# Patient Record
Sex: Male | Born: 1955 | Race: White | Hispanic: No | Marital: Married | Smoking: Never smoker
Health system: Southern US, Community
[De-identification: ages and names within clinical notes are randomized; demographics above are authoritative.]

## PROBLEM LIST (undated history)

## (undated) DIAGNOSIS — D3500 Benign neoplasm of unspecified adrenal gland: Secondary | ICD-10-CM

## (undated) DIAGNOSIS — K219 Gastro-esophageal reflux disease without esophagitis: Secondary | ICD-10-CM

## (undated) DIAGNOSIS — K760 Fatty (change of) liver, not elsewhere classified: Secondary | ICD-10-CM

## (undated) DIAGNOSIS — I10 Essential (primary) hypertension: Secondary | ICD-10-CM

## (undated) DIAGNOSIS — I350 Nonrheumatic aortic (valve) stenosis: Secondary | ICD-10-CM

## (undated) DIAGNOSIS — K2970 Gastritis, unspecified, without bleeding: Secondary | ICD-10-CM

## (undated) DIAGNOSIS — Z8489 Family history of other specified conditions: Secondary | ICD-10-CM

## (undated) DIAGNOSIS — K769 Liver disease, unspecified: Secondary | ICD-10-CM

## (undated) DIAGNOSIS — M1990 Unspecified osteoarthritis, unspecified site: Secondary | ICD-10-CM

## (undated) DIAGNOSIS — T8859XA Other complications of anesthesia, initial encounter: Secondary | ICD-10-CM

## (undated) DIAGNOSIS — G43109 Migraine with aura, not intractable, without status migrainosus: Secondary | ICD-10-CM

## (undated) DIAGNOSIS — K5730 Diverticulosis of large intestine without perforation or abscess without bleeding: Secondary | ICD-10-CM

## (undated) DIAGNOSIS — F329 Major depressive disorder, single episode, unspecified: Secondary | ICD-10-CM

## (undated) DIAGNOSIS — E119 Type 2 diabetes mellitus without complications: Secondary | ICD-10-CM

## (undated) DIAGNOSIS — R011 Cardiac murmur, unspecified: Secondary | ICD-10-CM

## (undated) DIAGNOSIS — T7840XA Allergy, unspecified, initial encounter: Secondary | ICD-10-CM

## (undated) DIAGNOSIS — E785 Hyperlipidemia, unspecified: Secondary | ICD-10-CM

## (undated) DIAGNOSIS — K209 Esophagitis, unspecified: Secondary | ICD-10-CM

## (undated) DIAGNOSIS — L280 Lichen simplex chronicus: Secondary | ICD-10-CM

## (undated) DIAGNOSIS — F32A Depression, unspecified: Secondary | ICD-10-CM

## (undated) DIAGNOSIS — I517 Cardiomegaly: Secondary | ICD-10-CM

## (undated) HISTORY — DX: Migraine with aura, not intractable, without status migrainosus: G43.109

## (undated) HISTORY — DX: Major depressive disorder, single episode, unspecified: F32.9

## (undated) HISTORY — DX: Unspecified osteoarthritis, unspecified site: M19.90

## (undated) HISTORY — DX: Gastritis, unspecified, without bleeding: K29.70

## (undated) HISTORY — DX: Lichen simplex chronicus: L28.0

## (undated) HISTORY — DX: Fatty (change of) liver, not elsewhere classified: K76.0

## (undated) HISTORY — DX: Hyperlipidemia, unspecified: E78.5

## (undated) HISTORY — DX: Cardiac murmur, unspecified: R01.1

## (undated) HISTORY — DX: Allergy, unspecified, initial encounter: T78.40XA

## (undated) HISTORY — DX: Nonrheumatic aortic (valve) stenosis: I35.0

## (undated) HISTORY — PX: CARDIAC VALVE REPLACEMENT: SHX585

## (undated) HISTORY — PX: SPINE SURGERY: SHX786

## (undated) HISTORY — DX: Gastro-esophageal reflux disease without esophagitis: K21.9

## (undated) HISTORY — DX: Type 2 diabetes mellitus without complications: E11.9

## (undated) HISTORY — DX: Essential (primary) hypertension: I10

## (undated) HISTORY — DX: Diverticulosis of large intestine without perforation or abscess without bleeding: K57.30

## (undated) HISTORY — DX: Depression, unspecified: F32.A

## (undated) HISTORY — DX: Esophagitis, unspecified: K20.9

---

## 1987-06-16 HISTORY — PX: LUMBAR LAMINECTOMY: SHX95

## 2004-06-15 HISTORY — PX: COLONOSCOPY: SHX174

## 2004-10-27 ENCOUNTER — Ambulatory Visit (HOSPITAL_COMMUNITY): Admission: RE | Admit: 2004-10-27 | Discharge: 2004-10-27 | Payer: Self-pay | Admitting: Gastroenterology

## 2007-07-26 ENCOUNTER — Inpatient Hospital Stay (HOSPITAL_COMMUNITY): Admission: EM | Admit: 2007-07-26 | Discharge: 2007-07-28 | Payer: Self-pay | Admitting: Emergency Medicine

## 2007-07-27 ENCOUNTER — Encounter (INDEPENDENT_AMBULATORY_CARE_PROVIDER_SITE_OTHER): Payer: Self-pay | Admitting: Internal Medicine

## 2007-07-27 ENCOUNTER — Ambulatory Visit: Payer: Self-pay | Admitting: *Deleted

## 2009-12-03 ENCOUNTER — Ambulatory Visit: Payer: Self-pay | Admitting: Internal Medicine

## 2009-12-03 DIAGNOSIS — E7800 Pure hypercholesterolemia, unspecified: Secondary | ICD-10-CM

## 2009-12-03 DIAGNOSIS — R0981 Nasal congestion: Secondary | ICD-10-CM | POA: Insufficient documentation

## 2009-12-03 DIAGNOSIS — M25819 Other specified joint disorders, unspecified shoulder: Secondary | ICD-10-CM | POA: Insufficient documentation

## 2009-12-03 DIAGNOSIS — G8929 Other chronic pain: Secondary | ICD-10-CM | POA: Insufficient documentation

## 2009-12-03 DIAGNOSIS — N529 Male erectile dysfunction, unspecified: Secondary | ICD-10-CM | POA: Insufficient documentation

## 2009-12-03 DIAGNOSIS — E1169 Type 2 diabetes mellitus with other specified complication: Secondary | ICD-10-CM | POA: Insufficient documentation

## 2009-12-03 DIAGNOSIS — M7580 Other shoulder lesions, unspecified shoulder: Secondary | ICD-10-CM

## 2009-12-03 DIAGNOSIS — F3342 Major depressive disorder, recurrent, in full remission: Secondary | ICD-10-CM | POA: Insufficient documentation

## 2009-12-03 DIAGNOSIS — K5730 Diverticulosis of large intestine without perforation or abscess without bleeding: Secondary | ICD-10-CM | POA: Insufficient documentation

## 2009-12-03 DIAGNOSIS — M19011 Primary osteoarthritis, right shoulder: Secondary | ICD-10-CM | POA: Insufficient documentation

## 2009-12-03 DIAGNOSIS — I10 Essential (primary) hypertension: Secondary | ICD-10-CM | POA: Insufficient documentation

## 2009-12-03 DIAGNOSIS — R6882 Decreased libido: Secondary | ICD-10-CM | POA: Insufficient documentation

## 2009-12-09 ENCOUNTER — Encounter: Admission: RE | Admit: 2009-12-09 | Discharge: 2010-01-22 | Payer: Self-pay | Admitting: Internal Medicine

## 2009-12-10 ENCOUNTER — Telehealth (INDEPENDENT_AMBULATORY_CARE_PROVIDER_SITE_OTHER): Payer: Self-pay | Admitting: *Deleted

## 2009-12-10 LAB — CONVERTED CEMR LAB
Direct LDL: 153.6 mg/dL
PSA: 0.55 ng/mL (ref 0.10–4.00)

## 2009-12-11 ENCOUNTER — Encounter: Payer: Self-pay | Admitting: Family Medicine

## 2010-02-18 ENCOUNTER — Ambulatory Visit: Payer: Self-pay | Admitting: Internal Medicine

## 2010-02-21 LAB — CONVERTED CEMR LAB
Cholesterol: 189 mg/dL (ref 0–200)
HDL: 46.1 mg/dL (ref >39.00)
VLDL: 19.6 mg/dL (ref 0.0–40.0)

## 2010-04-22 ENCOUNTER — Ambulatory Visit: Payer: Self-pay | Admitting: Internal Medicine

## 2010-04-22 DIAGNOSIS — E118 Type 2 diabetes mellitus with unspecified complications: Secondary | ICD-10-CM | POA: Insufficient documentation

## 2010-04-22 DIAGNOSIS — M25569 Pain in unspecified knee: Secondary | ICD-10-CM | POA: Insufficient documentation

## 2010-04-22 DIAGNOSIS — E1169 Type 2 diabetes mellitus with other specified complication: Secondary | ICD-10-CM | POA: Insufficient documentation

## 2010-07-15 NOTE — Miscellaneous (Signed)
Summary: PT Initial Summary/[facility]  PT Initial [facility]   Imported By: Lanelle Bal 12/19/2009 10:09:01  _____________________________________________________________________  External Attachment:    Type:   Image     Comment:   External Document

## 2010-07-15 NOTE — Progress Notes (Signed)
Summary: Lab results  Phone Note Outgoing Call Call back at [facility] Phone [phone]   Call placed by: Shonna Chock,  December 10, 2009 11:49 AM Call placed to: Patient Summary of Call: Spoke with patient about labs below: PSA is excellent; ideal = < 1.00. Diabetes is present if A1c is > 6.1%. If Diabetes is present , LDL (BAD cholesterol, ) goal = < 100, ideally < 70. Please follow a low carb nutrtion program (Ex Sugar [employer]usters or [place], NOT [place] Sages'). Add Metformin , which will make your insulin more effective. Please repeat fasting  labs after 10 weeks & see me 2-3 days later.It was a pleasure to meet you . Hopp ( Lipids, BUN,creat, A1c; 250.00,272.4)  Chrae Malloy  December 10, 2009 11:49 AM

## 2010-07-15 NOTE — Assessment & Plan Note (Signed)
Summary: review for one med. glucose readings, will be fasting///sph   Vital Signs:  Patient profile:   55 year old male Height:      71.5 inches Weight:      307.6 pounds BMI:     42.46 Temp:     99.0 degrees F oral Pulse rate:   72 / minute Resp:     16 per minute BP sitting:   128 / 80  (left arm) Cuff size:   large  Vitals Entered By: Shonna Chock CMA (April 22, 2010 9:32 AM) CC: Renew Glucophage (if to continue), fasting if any labs needed, Type 2 diabetes mellitus follow-up, Lower Extremity Joint pain   CC:  Renew Glucophage (if to continue), fasting if any labs needed, Type 2 diabetes mellitus follow-up, and Lower Extremity Joint pain.  History of Present Illness: Type 2 Diabetes Mellitus Follow-Up      This is a 55 year old [name] who presents for Type 2 diabetes mellitus follow-up.  The patient denies polyuria, polydipsia, blurred vision, self managed hypoglycemia, weight loss, weight gain, and numbness of extremities.  The patient denies the following symptoms: neuropathic pain, chest pain, vomiting, orthostatic symptoms, poor wound healing, intermittent claudication, vision loss, and foot ulcer.  Since the last visit the patient reports good dietary compliance ( < 2500 calories/ day), exercising regularly ( walking 3-4X/week).He is  not monitoring blood glucose.  Since the last visit, the patient reports having had eye care by an Ophthalmologist ( no retinopathy)  and no foot care.  Hyperlipidemia Follow-Up      The patient also presents for Hyperlipidemia follow-up.  The patient denies the following symptoms: exercise intolerance, dypsnea, palpitations, syncope, and pedal edema.  Adjunctive measures currently used by the patient include ASA, niacin, and fish oil supplements.  Statin was D/Ced ? in 2005 for ? reason. Lower Extremity Joint Pain      The patient also presents with Lower Extremity Joint pain X 2-3 months intermittently.  The patient reports popping and decreased  ROM, but denies swelling, redness, giving away, locking, stiffness for >1 hr, and weakness.  The pain is located in the right knee.  The pain began gradually and with no injury.  The pain is described as burning.  Evaluation to date has included no evaluation.  The patient denies the following symptoms: fever, rash, photosensitivity, eye symptoms, diarrhea, and dysuria.  Rx: NSAIDS.  Current Medications (verified): 1)  Fish Oil 1200 Mg Caps (Omega-3 Fatty Acids) .... One Tablet By Mouth Once Daily 2)  Niacin 250 Mg Tabs (Niacin) .... One Tablet By Mouth Once Daily 3)  Topamax 50 Mg Tabs (Topiramate) .... 2 By Mouth Two Times A Day 4)  Viagra 100 Mg Tabs (Sildenafil Citrate) .... 1/2 -1  Once Daily As Needed 5)  Glucophage Xr 500 Mg Xr24h-Tab (Metformin Hcl) .Marland Kitchen.. 1 Two Times A Day With 2 Largest Meals 6)  Aspirin 81 Mg Tabs (Aspirin) .Marland Kitchen.. 1 By Mouth Once Daily  Allergies: 1)  ! Penicillin  Physical Exam  General:  in no acute distress; alert,appropriate and cooperative throughout examination Lungs:  Normal respiratory effort, chest expands symmetrically. Lungs are clear to auscultation, no crackles or wheezes. Heart:  Normal rate and regular rhythm. S1 and S2 normal without gallop, murmur, click, rub or other extra sounds. Pulses:  R and L carotid,radial,dorsalis pedis and posterior tibial pulses are full and equal bilaterally Extremities:  No clubbing, cyanosis, edema. Crepitus R knee > L w/o effusion. Good  nail health Neurologic:  alert & oriented X3 and sensation intact to light touch over feet.   Skin:  Intact without suspicious lesions or rashes Psych:  memory intact for recent and remote, normally interactive, and good eye contact.     Impression & Recommendations:  Problem # 1:  KNEE PAIN, RIGHT (ICD-719.46)  new issue His updated medication list for this problem includes:    Aspirin 81 Mg Tabs (Aspirin) .Marland Kitchen... 1 by mouth once daily    Tramadol Hcl 50 Mg Tabs (Tramadol hcl) .Marland Kitchen...  1/2 -1 every 6 hrs as needed for knee pain  Orders: Venipuncture (25956) TLB-Uric Acid, Blood (84550-URIC) T-Knee Right 2 view (73560TC)  Problem # 2:  HYPERTENSION (ICD-401.9) controlled  Problem # 3:  HYPERLIPIDEMIA (ICD-272.4) LDL not @ minimal goal His updated medication list for this problem includes:    Niacin 250 Mg Tabs (Niacin) ..... One tablet by mouth once daily    Pravastatin Sodium 20 Mg Tabs (Pravastatin sodium) .Marland Kitchen... 1 at bedtime  Problem # 4:  DIABETES MELLITUS (ICD-250.00)  His updated medication list for this problem includes:    Glucophage Xr 500 Mg Xr24h-tab (Metformin hcl) .Marland Kitchen... 1 two times a day with 2 largest meals    Aspirin 81 Mg Tabs (Aspirin) .Marland Kitchen... 1 by mouth once daily  Complete Medication List: 1)  Fish Oil 1200 Mg Caps (Omega-3 fatty acids) .... One tablet by mouth once daily 2)  Niacin 250 Mg Tabs (Niacin) .... One tablet by mouth once daily 3)  Topamax 50 Mg Tabs (Topiramate) .... 2 by mouth two times a day 4)  Viagra 100 Mg Tabs (Sildenafil citrate) .... 1/2 -1  once daily as needed 5)  Glucophage Xr 500 Mg Xr24h-tab (Metformin hcl) .Marland Kitchen.. 1 two times a day with 2 largest meals 6)  Aspirin 81 Mg Tabs (Aspirin) .Marland Kitchen.. 1 by mouth once daily 7)  Pravastatin Sodium 20 Mg Tabs (Pravastatin sodium) .Marland Kitchen.. 1 at bedtime 8)  Tramadol Hcl 50 Mg Tabs (Tramadol hcl) .... 1/2 -1 every 6 hrs as needed for knee pain 9)  Onetouch Delica Lancets Misc (Lancets) .... Check bloodsugar once daily dx: 250.00 10)  Onetouch Ultra Blue Strp (Glucose blood) .... Check bloodsugar once daily, dx: 250.00  Other Orders: Admin 1st Vaccine (38756) Flu Vaccine 46yrs + (43329)  Patient Instructions: 1)  Consume < 40 grams of High Fructose Corn Syrup sugar/ day. 2)  Please schedule a follow-up appointment in 3 months. 3)  Hepatic Panel prior to visit, ICD-9:995.20 4)  Lipid Panel prior to visit, ICD-9:272.4 5)  HbgA1C prior to visit, ICD-9:250.00 6)  Urine Microalbumin prior to  visit, ICD-9:250.00 7)  Check your blood sugars regularly.fasting goal = 90-150. 8)  See your eye doctor yearly to check for diabetic eye damage. 9)  Check your feet each night for sore areas, calluses or signs of infection. Prescriptions: ONETOUCH ULTRA BLUE  STRP (GLUCOSE BLOOD) Check bloodsugar once daily, DX: 250.00  #100 x 3   Entered by:   Shonna Chock CMA   Authorized by:   Marga Melnick MD   Signed by:   Shonna Chock CMA on 04/22/2010   Method used:   Faxed to ...       [facility]  [address] [id number] (retail)       [address]       [place], [place]  [address]       Ph: [phone] or [phone]  Fax: [phone]   RxID:   [id number] [facility] DELICA LANCETS  MISC (LANCETS) Check bloodsugar once daily DX: 250.00  #100 x 3   Entered by:   Shonna Chock CMA   Authorized by:   Marga Melnick MD   Signed by:   Shonna Chock CMA on 04/22/2010   Method used:   Faxed to ...       [facility]  [address] [id number] (retail)       [address]       [place], [place]  [address]       Ph: [phone] or [phone]       Fax: [phone]   RxID:   [id number] TRAMADOL HCL 50 MG TABS (TRAMADOL HCL) 1/2 -1 every 6 hrs as needed for knee pain  #30 x 1   Entered and Authorized by:   Marga Melnick MD   Signed by:   Marga Melnick MD on 04/22/2010   Method used:   Print then Give to Patient   RxID:   [id number] PRAVASTATIN SODIUM 20 MG TABS (PRAVASTATIN SODIUM) 1 at bedtime  #90 x 0   Entered and Authorized by:   Marga Melnick MD   Signed by:   Marga Melnick MD on 04/22/2010   Method used:   Print then Give to Patient   RxID:   [id number] GLUCOPHAGE XR 500 MG XR24H-TAB (METFORMIN HCL) 1 two times a day WITH 2 largest meals  #180 x 1   Entered and Authorized by:   Marga Melnick MD   Signed by:   Marga Melnick MD on 04/22/2010   Method used:   Faxed to ...       [facility]  [address] [id number] ([facility]       [address]        [place], [place]  [address]       Ph: [phone] or [phone]       Fax: [phone]   RxID:   [id number]  Flu Vaccine Consent Questions     Do you have a history of severe allergic reactions to this vaccine? no    Any prior history of allergic reactions to egg and/or gelatin? no    Do you have a sensitivity to the preservative Thimersol? no    Do you have a past history of Guillan-Barre Syndrome? no    Do you currently have an acute febrile illness? no    Have you ever had a severe reaction to latex? no    Vaccine information given and explained to patient? yes    Are you currently pregnant? no    Lot Number:[id number]   Exp Date:12/13/2010   Site Given  Left Deltoid IM   Orders Added: 1)  Admin 1st Vaccine [90471] 2)  Flu Vaccine 26yrs + [90658] 3)  Est. Patient Level IV [38101] 4)  Venipuncture [75102] 5)  TLB-Uric Acid, Blood [84550-URIC] 6)  T-Knee Right 2 view [73560TC]   .lbflu

## 2010-07-15 NOTE — Assessment & Plan Note (Signed)
Summary: new to est/lch   Vital Signs:  Patient profile:   55 year old male Height:      71 inches Weight:      312 pounds BMI:     43.67 BSA:     2.55 Pulse rate:   74 / minute Pulse rhythm:   regular Resp:     17 per minute BP sitting:   142 / 86  (left arm) Cuff size:   regular  Vitals Entered By: Ok Anis CMA (December 03, 2009 11:50 AM)  CC:  Right shoulder pain and can not bend right arm  and Shoulder pain.  History of Present Illness:  Shoulder Pain      This is a 55 year old man who presents with Shoulder pain present 2 years.  The patient reports stiffness and impaired ROM, but denies numbness, weakness, tingling, locking, swelling, and redness.  The pain is located in the right shoulder.  The pain began gradually and without injury.  The patient describes the pain as constant, burning, and aching.  The pain is better with rest and NSAIDS.  The pain is worse with activity & ROM ,especially throwing or curling(as weight lifting)  motions. No ADL impairment. There has been no evaluation to date.  Preventive Screening-Counseling & Management  Alcohol-Tobacco     Smoking Status: never  Caffeine-Diet-Exercise     Does Patient Exercise: yes  Current Medications (verified): 1)  Fish Oil 1200 Mg Caps (Omega-3 Fatty Acids) .... One Tablet By Mouth Once Daily 2)  Niacin 250 Mg Tabs (Niacin) .... One Tablet By Mouth Once Daily 3)  Topamax 50 Mg Tabs (Topiramate) .... One Tablet By Mouth Two Times A Day  Allergies (verified): 1)  ! Penicillin  Past History:  Past Medical History: Benign  Tremor RUE, Ophthalmic Migraines with slurred speech  Dr Thad Ranger ; ED Allergic rhinitis Hypertension, PMH of  Hyperlipidemia Depression, PMH of Murmur , "Aortic Sclerosis" , SBE prophylaxis not needed as per  Cardiology Diverticulosis, colon  Past Surgical History: Lumbar laminectomy L5-S1 Colonoscopy : Tics  Family History: Father: [place]',HTN, CVA , Lipids Mother:  COAD Siblings: bro: prostate cancer; bro : CBAG, DM; M uncles :CAD; M uncle: asbestosis;  Paternal FH Schizophrenia  Social History: Occupation: Secondary school teacher Married Never Smoked Alcohol use-yes:EXTREMELY rarely Regular exercise-yes:walks 1 hr 3-4X/week Smoking Status:  never Does Patient Exercise:  yes  Review of Systems Eyes:  Denies blurring, double vision, and vision loss-both eyes. ENT:  Denies difficulty swallowing and hoarseness. CV:  Denies chest pain or discomfort, leg cramps with exertion, swelling of feet, and swelling of hands. Resp:  Complains of morning headaches; denies excessive snoring and hypersomnolence; Np apnea as per wife. GI:  Denies abdominal pain, dark tarry stools, and indigestion. GU:  Complains of decreased libido, erectile dysfunction, and urinary frequency; denies discharge, dysuria, and hematuria. MS:  Complains of muscle weakness. Derm:  Denies changes in nail beds, dryness, hair loss, and poor wound healing. Neuro:  Denies brief paralysis, numbness, tingling, and weakness. Psych:  Complains of easily angered, easily tearful, and irritability; denies anxiety and depression; He has been intolerant to Wellbutrin & Cymbalta. Endo:  Complains of cold intolerance and excessive urination; denies excessive hunger, excessive thirst, and heat intolerance; DM screen  "normal " in 07/2009.  Physical Exam  General:  in no acute distress; alert,appropriate and cooperative throughout examination;overweight-appearing.   Neck:  No deformities, masses, or tenderness noted. Lungs:  Normal respiratory effort, chest expands symmetrically. Lungs  are clear to auscultation, no crackles or wheezes. Heart:  normal rate, regular rhythm, no gallop, no rub, no JVD, no HJR, and grade  1/6 systolic murmur @ R base.   Abdomen:  Bowel sounds positive,abdomen soft and non-tender without masses, organomegaly or hernias noted. Msk:  No deformity or scoliosis noted of thoracic or  lumbar spine.   Pulses:  R and L carotid,radial,dorsalis pedis and posterior tibial pulses are full and equal bilaterally Extremities:  No clubbing, cyanosis, edema, or deformity noted .Decreased  range of motion of  R shoulder .Pain with passive ROM Neurologic:  alert & oriented X3, strength normal in all extremities, gait normal, DTRs symmetrical and normal, finger-to-nose normal, and Romberg negative.   Skin:  Intact without suspicious lesions or rashes Cervical Nodes:  No lymphadenopathy noted Axillary Nodes:  No palpable lymphadenopathy Psych:  memory intact for recent and remote, normally interactive, good eye contact, not anxious appearing, and not depressed appearing.     Impression & Recommendations:  Problem # 1:  SHOULDER IMPINGEMENT SYNDROME, RIGHT (ICD-726.2)  Orders: Physical Therapy Referral (PT)  Problem # 2:  HYPERTENSION (ICD-401.9)  PMH of  Orders: Venipuncture (60454)  Problem # 3:  HYPERLIPIDEMIA (ICD-272.4)  PMH of His updated medication list for this problem includes:    Niacin 250 Mg Tabs (Niacin) ..... One tablet by mouth once daily  Orders: Venipuncture (09811) TLB-Lipid Panel (80061-LIPID)  Problem # 4:  ERECTILE DYSFUNCTION, ORGANIC (ICD-607.84)  Orders: TLB-PSA (Prostate Specific Antigen) (84153-PSA) Venipuncture (91478) TLB-A1C / Hgb A1C (Glycohemoglobin) (83036-A1C)  His updated medication list for this problem includes:    Viagra 100 Mg Tabs (Sildenafil citrate) .Marland Kitchen... 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 One tablet by mouth once daily 2)  Niacin 250 Mg Tabs (Niacin) .... One tablet by mouth once daily 3)  Topamax 50 Mg Tabs (Topiramate)  .... One tablet by mouth two times a day 4)  Viagra 100 Mg Tabs (Sildenafil citrate) .... 1/2 -1  once daily as needed  Patient Instructions: 1)  Call if pain meds are needed. Prescriptions: VIAGRA 100 MG TABS (SILDENAFIL CITRATE) 1/2 -1  once daily as needed  #10 x 3   Entered and Authorized by:   Marga Melnick MD   Signed by:   Marga Melnick MD on 12/03/2009   Method used:   Print then Give to Patient   RxID:   [id number]

## 2010-10-22 ENCOUNTER — Other Ambulatory Visit: Payer: Self-pay | Admitting: Internal Medicine

## 2010-10-22 NOTE — Telephone Encounter (Signed)
2)  Please schedule a follow-up appointment in 3 months. 3)  Hepatic Panel prior to visit, ICD-9:995.20 4)  Lipid Panel prior to visit, ICD-9:272.4 5)  HbgA1C prior to visit, ICD-9:250.00 6)  Urine Microalbumin prior to visit, ICD-9:250.00  Copied from 04/22/2010, labs were due 07/2010

## 2010-10-28 NOTE — H&P (Signed)
NAME:  HAPPY, [place] NO.:  [id number]   MEDICAL RECORD NO.:  [id number]          PATIENT TYPE:  INP   LOCATION:  0109                         FACILITY:  [facility]   PHYSICIAN:  Ramiro Harvest, MD    DATE OF BIRTH:  07/13/1955   DATE OF ADMISSION:  07/26/2007  DATE OF DISCHARGE:                              HISTORY & PHYSICAL   PRIMARY CARE PHYSICIAN:  Dr. Theresia Lo of [facility].   CARDIOLOGIST:  Dr. Everette Rank of [facility].   HISTORY OF PRESENT ILLNESS:  Evan [name] is a 56 year old white  gentleman with a history of hypertension, hyperlipidemia, erectile  dysfunction and family history of TIAs who presents from the PCP's  office with a 34-month history of dizziness, disequilibrium, slurred  speech and stuttering with mild headache and occasional tingling on  bilateral forearms as well as some diaphoresis.  The patient also states  that at times he feels that he is being pushed to the left. The patient  denies chest pain, no shortness of breath, no palpitations, no facial  asymmetry.  No numbness.  No visual changes.  The patient states  symptoms occur at any time and usually last about 2 hours but never  entirely go away. The patient states that symptoms have been worsening  over the past 2 weeks and coincide with the start of diuretic of HCTZ  and has also had a stress test and echo done per cardiologist 4 days  prior to admission with results which were pending. The patient went to  see primary care physician when he had a similar episode and was sent to  the ED for further evaluation. In the ED, a head CT was done which was  negative, labs were unremarkable. We were called to admit the patient  for further evaluation and management.   ALLERGIES:  The patient is allergic to PENICILLIN.   PAST MEDICAL HISTORY:  1. Hypertension.  2. Lumbar disk disease.  3. Hyperlipidemia.  4. Diverticulosis with colonoscopy of May 2006.  5. Erectile  dysfunction.  6. History of syncope thought to be secondary to Wellbutrin in 2002.      Medication was stopped and no episodes since then.  7. History of depression.   HOME MEDICATIONS:  1. Lipitor 10 mg daily.  2. Lotensin 10 mg daily.  3. Viagra 100 mg daily p.r.n.  4. HCTZ 12.5 mg daily.   SOCIAL HISTORY:  The patient lives in [place] with his wife and  has two children. He denies any tobacco abuse  Occasional alcohol use.  No IV drug use.   FAMILY HISTORY:  The patient's mother deceased age 1 from emphysema.  Father deceased age 81. He had a history of all Alzheimer's dementia,  UTI complications and a history of TIAs for 10 years. The patient has  two brothers, one brother with prostate cancer and the other half-  brother with diabetes and status post CABG. The patient has two  children, one of them with osteogenesis imperfecta, the other one  healthy.   REVIEW OF SYSTEMS:  As per HPI, otherwise negative.  PHYSICAL EXAM:  Temperature 97.2, blood pressure 139/85, pulse of 80,  respiratory rate 27, 95% on room air. CBG of 1-1/7.  GENERAL:  The patient is laying on gurney in no apparent distress.  HEENT: Normocephalic, atraumatic.  Pupils equal, round and reactive to  light.  Extraocular movements intact.  Oropharynx is clear, moist, no  lesions.  No exudates.  NECK:  Supple.  No lymphadenopathy.  RESPIRATORY:  Lungs are clear to auscultation bilaterally.  No wheezes,  no rhonchi.  No rales.  CARDIOVASCULAR:  Regular rate and rhythm.  No murmurs, rubs or gallops.  ABDOMEN:  Soft, nontender, nondistended.  Positive bowel sounds.  EXTREMITIES:  No clubbing, cyanosis or edema.  NEUROLOGICAL:  The patient is alert and oriented x3.  Cranial nerves II-  XII grossly intact.  Sensation is intact.  Cerebellum is intact.  Unable  to elicit reflexes diffusely. Visual fields are intact. Babinski is  negative. Pronator drift is negative, 5/5 bilateral upper extremity   strength, 5/5 bilateral lower extremity strength.  Normal grip strength.  No focal deficits.  Gait is unremarkable.   LABORATORY DATA:  White count 8.1, hemoglobin 15.2, platelets 308,  hematocrit 44.0, ANC of 4.6, sodium 3.8, potassium 3.5, chloride 102,  bicarb 29, BUN 9, creatinine 0.74, glucose of 109, calcium of 9.2, PTT  32.  PT 13.2, INR 1.0.  Point of care cardiac markers, CK-MB less than  1, troponin I less than 0.05, myoglobin 77.8. Head CT is normal.  EKG  normal sinus rhythm. Q-waves in leads 3 and aVF.   ASSESSMENT AND PLAN:  Evan [name] is a 55 year old gentleman with  a history of hypertension, hyperlipidemia, family history of TIA who  presents with a 67-month history of dizziness and slurred speech with  some worsening dizziness.   1. Dizziness/slurred speech.  Differential includes neurologic(TIA      versus CVA) versus neurocardiogenic versus cardiogenic versus      orthostasis versus anemia (unlikely with hemoglobin normal) versus      hypoxia( unlikely patient saturations greater than 90% on room air)      versus hypoglycemia(CBG of 117) versus drug-induced. The patient      is on a diuretic. Will admit the patient on to a telemetry floor.      Will check a hepatic panel, check orthostatics and get a swallow      evaluation. Check carotid Dopplers.  Check MRI, MRA of the head and      neck, check magnesium, check a UDS, check alcohol level, check a      homocystine level.  Check a hemoglobin A1c, cycle cardiac enzymes      q.12 h x2.  Check a fasting lipid panel, PT, OT,  aspirin 325 mg      daily.  Will need to get results of the echo and the stress test      from the cardiologist's office, Dr. Hoyle Barr office. If the      patient's MRI is positive will likely need a neurological consult.      Will hold the patient's diuretics for now and follow.  2. Hyperlipidemia. Will check a fasting lipid panel.  Continue home      dose of Lipitor.  3. Hypertension.   Continue home dose Lotensin. Hold      hydrochlorothiazide secondary to problem #1.  4. History of depression, stable.  5. Erectile dysfunction.  6. Diverticulosis.  7. Prophylaxis. Protonix for GI prophylaxis.  SCDs for DVT  prophylaxis.  It has been a pleasure taking care of Evan [name].      Ramiro Harvest, MD  Electronically Signed     DT/MEDQ  D:  07/26/2007  T:  07/27/2007  Job:  [id number]   cc:   Vikki Ports, M.D.  Fax: [phone]   Corky Crafts, MD  Fax: [phone]

## 2010-10-28 NOTE — Consult Note (Signed)
NAME:  Evan [name], Evan [name]               ACCOUNT NO.:  [id number]   MEDICAL RECORD NO.:  [id number]          PATIENT TYPE:  INP   LOCATION:  1406                         FACILITY:  [facility]   PHYSICIAN:  Casimiro Needle L. Reynolds, M.D.DATE OF BIRTH:  21-Oct-1955   DATE OF CONSULTATION:  07/28/2007  DATE OF DISCHARGE:                                 CONSULTATION   REQUESTING PHYSICIAN:  Dr. Adela Glimpse.   PRIMARY CARE PHYSICIAN:  Dr. Lanell Persons.   REASON FOR EVALUATION:  Dizziness, possible TIA.   HPI:  This is the initial outpatient consult visit for this 55 year old  man with a past medical history which includes hypertension,  hyperlipidemia, on medications, as well obesity.  The patient presented  to his primary care physician's office yesterday with problems with  subjective slurred speech, dizziness, and sensation of being pulled to  the left.  In reviewing the chart, it appears that he has actually had  these symptoms for a couple of months, and perhaps they have accelerated  over the past couple of weeks.  He said that he has a persistent  sensation of lightheadedness which is with him at all times, but  sometimes worse than others.  It does not seem to be necessarily  exacerbated by position changes.  It is definitely a lightheaded  sensation close to a vertigo or dizziness.  He has not really been  unsteady on his feet, although the lightheadedness has made him a little  bit nervous about driving.  He has not passed out or lost consciousness.  He does report a persistent, dull, bitemporal headache over this period  of time.  He says he does not really have a normal history of headache.  He does report ringing in his ears which is chronic but a little worse  lately, as well as subjective slurred speech and a tendency to stutter.  He thinks that the symptoms got worse around the time he started the  hydrochlorothiazide a couple of weeks ago.  He was admitted for these  symptoms, has had a  neurologic workup including MRI and MRA which is  personally reviewed.  He had a recent 2D echocardiogram and Cardiolite  study through [facility] which were also read as unremarkable.  Neurologic consultation was requested.   PAST MEDICAL HISTORY:  Remarkable for:  1. Hypertension and hyperlipidemia, both of which are treated.  2. He has history of erectile dysfunction which he takes occasional      Viagra.  3. He had syncope several years ago, thought due to Wellbutrin.  He      says he tolerates both Wellbutrin and Cymbalta poorly.  4. He does admit to a history of depression.  5. He has history of obesity and lumbar disk disease.   FAMILY/SOCIAL/REVIEW OF SYSTEMS:  Per admission H&P by Dr. Janee Morn on  July 26, 2007, which was reviewed.   MEDICATIONS ON ADMISSION:  He was taking:  1. Lipitor.  2. Lotensin.  3. Viagra.  4. Hydrochlorothiazide.   In the hospital, he has also been taking aspirin, Protonix, and the  blood pressure medicines  have been held.   PHYSICAL EXAMINATION:  VITAL SIGNS:  Temperature 97.7.  Blood pressure  119/61.  Pulse 65.  Respirations 18.  O2 sat 95% on room air.  Orthostatic vitals show a mild but appropriate increase in the blood  pressure lying and standing, with an increase in pulse as well.  HEAD:  Cranium was normocephalic and atraumatic.  Oropharynx benign.  NECK:  Supple without carotid or supraclavicular bruits.  HEART:  Regular rate and rhythm without murmurs.  NEUROLOGICAL EXAMINATION:  Mental status, he is awake, alert, and fully  oriented to time, place, and person.  Recent and remote memory are  intact.  Attention span, concentration, and fund of knowledge are all  appropriate.  Speech is fluent and not dysarthric.  He does very  occasionally stutter.  He seems to be rather anxious.  CRANIAL NERVES:  Pupils are equal and reactive.  Extraocular movements  are full without nystagmus.  Visual fields are full to confrontation.   Hearing is intact to conversational speech.  Face and the palate move  normally and symmetrically.  MOTOR:  Normal bulk and tone.  Normal strength in all tested extremity  muscles.  Sensation is intact to light touch, pin prick, and double  simultaneous stimulation in all extremities.  COORDINATION:  Rapid movements performed accurately.  Finger to nose and  heel-shin are performed accurately.  GAIT:  He arises easily from a chair and his stance is normal.  He is  able to heel-toe and tandem walk without much difficulty.  Reflexes 2+  and symmetric.  Toes are downgoing bilaterally.   LABORATORY REVIEW:  MRI of the brain with MRA of the intracranial and  extracranial circulation performed July 26, 2007, are personally  reviewed, and I would agree that the studies are normal.   IMPRESSION:  Nonspecific dizziness.  There is really no evidence that  this is cerebrovascular.  I suspect this is most likely due to anxiety.  The exam and/or imaging are normal.   RECOMMENDATIONS:  We will continue aspirin daily.  Recommend treat  anxiety with an SSRI, BuSpar, p.r.n. benzo, or combination thereof.  Plan to follow up with his primary physician.  Of note, he is obese and  admits to snoring, and it might be wise to look at a polysomnogram as he  admits to not sleeping very well, which may be due to anxiety but may  also be due in part to sleep apnea.  We will follow up p.r.n..  Thank  you for the consultation.      Michael L. Thad Ranger, M.D.  Electronically Signed     MLR/MEDQ  D:  07/28/2007  T:  07/29/2007  Job:  [id number]   cc:   Michiel Cowboy, MD   Vikki Ports, M.D.  Fax: [phone]   Corky Crafts, MD  Fax: [phone]

## 2010-10-28 NOTE — Discharge Summary (Signed)
NAME:  Evan [name], Evan [name]               ACCOUNT NO.:  [id number]   MEDICAL RECORD NO.:  [id number]          PATIENT TYPE:  INP   LOCATION:  1406                         FACILITY:  [facility]   PHYSICIAN:  Michiel Cowboy, MDDATE OF BIRTH:  06-01-1956   DATE OF ADMISSION:  07/26/2007  DATE OF DISCHARGE:  07/28/2007                               DISCHARGE SUMMARY   DISCHARGE DIAGNOSES:  1. Lightheadedness.  2. Question a transient ischemic attack versus anxiety.  3. Hypertension.  4. Hyperlipidemia.  5. History of depression.   STUDIES:  CT of the head without contrast showed normal examination.  Chest x-ray on the 10th of February showing no  acute cardiopulmonary  disease, mild peribronchial thickening.  MRI of the brain without  contrast showed normal appearance of the neck vessels, specifically no  carotid bifurcation stenosis, brachiocephalic vessel origins, arch and  vertebral artery origins are not  visualized.  MRI/MRA of the head  showing normal MRI of the brain, negative intracranial MRI angiography  of the large and medium-sized vessels.  Dopplers showing a left mid  internal carotid artery 40 to 60% low to mid range stenosis and a right  proximal internal carotid artery that has 40 to 59% stenosis.  Noted  high velocities throughout the common carotid artery as well.  Vertebral  artery flow antegrade bilaterally.   Other labs include LFTs within normal limits.  Hemoglobin A1c was 6.2.  Cardiac enzymes x3 within normal limits.  Total cholesterol 163, LDL 88,  homocysteine 7.1 which is within normal limits .  EKG:  Showing normal  sinus rhythm, no abnormalities on the telemetry.   HOSPITAL COURSE:  Please see H and P but briefly, this is a 55 year old  gentleman who presented with a history of six weeks' duration of  constant lightheadedness which on occasion becomes more severe and  occasional stuttering.  The patient has had an extensive cardiac workup  prior to this  including a stress testand an echocardiogram by Dr.  Eldridge Dace, which were all negative.  The patient presented to the  emergency department with another episode of worsening lightheadedness  and stuttering, admitted by [facility].   HOSPITAL COURSE:  1. Lightheadedness.  Etiology is not quite clear, but the patient was      noted to be relatively hypotensive; systolic is down to 106.  Will      hold his Lotensin.  His orthostatics were checked, and he was noted      to have somewhat elevated heart rate when he stands up, but his      blood pressure actually went up as well.  The patient was placed in      telemetry for 48 hours without any events.  2. Concerning his stuttering, a neurological workup was performed      including MRI/MRA of the brain, MRA of the neck vessels as well as      Dopplers as well as neurological consult per neurology.  His      symptoms are more consistent with anxiety or of a TIA.  His  neurological workup had negative MRI of the brain showing no      stroke.  Doppler did show mild to moderate stenosis bilaterally.      Would have patient discharged on aspirin 81 mg daily.  Continue      with modification strategy.  Continue Lipitor.  His LDL is actually      88.  Hemoglobin A1c is 6.2.  The patient is to have repeat Dopplers      in the next 6 months to see if there is any progression of disease.      If he has any neurological abnormalities, would suggest that he be      further evaluated.  May also  benefit in the future from a CT      angiogram of the neck  for further evaluation of plaque.  Will      defer this to his  primary care Evan [name].  3. History of depression.  The patient has been on Wellbutrin in the      past and developed similar reaction to the above.  The patient does      not wish to start any SSRI anxiolytic at this point.   DISCHARGE MEDICATIONS:  Lipitor 10 mgp.o.  daily.  Stop Lotensin.  The  patient should not use Viagra  while he is lightheaded.  Would continue  aspirin 81 mg p.o. daily.  The patient needs to check his blood pressure  while at home daily and call his physician if his systolic blood  pressure is above 180.  Otherwise, he needs to have close supervision  and follow up with his primary care Evan [name], Dr. Theresia Lo in one to two  weeks, and also he can return to Dr. Eldridge Dace for further workup and  further follow up concerning his consistent lightheadedness.      Michiel Cowboy, MD  Electronically Signed     AVD/MEDQ  D:  07/28/2007  T:  07/29/2007  Job:  [id number]   cc:   Vikki Ports, M.D.  Fax: [phone]

## 2010-10-31 NOTE — Op Note (Signed)
NAME:  Evan [name], Evan [name]               ACCOUNT NO.:  [id number]   MEDICAL RECORD NO.:  [id number]          PATIENT TYPE:  AMB   LOCATION:  ENDO                         FACILITY:  [facility]   PHYSICIAN:  John C. Madilyn Fireman, M.D.    DATE OF BIRTH:  09-07-1955   DATE OF PROCEDURE:  10/27/2004  DATE OF DISCHARGE:                                 OPERATIVE REPORT   PROCEDURE:  Colonoscopy.   INDICATIONS FOR PROCEDURE:  Average risk colon cancer screening.   PROCEDURE:  The patient was placed in the left lateral decubitus position  and placed on the pulse monitor with continuous low-flow oxygen delivered by  nasal cannula.  He was sedated with 100 mcg IV fentanyl and 7 mg IV Versed.  The Olympus video colonoscope was inserted into the rectum and advanced to  cecum, confirmed by transillumination of McBurney's point and visualization  of the ileocecal valve and appendiceal orifice.  The prep was fairly good  but quite suboptimal in some areas, and I could not rule out small lesions  less than 1 cm in all location.  Otherwise the cecum, ascending, and  transverse colon all appeared normal with no masses, polyps, diverticula, or  other mucosal abnormalities.  Within the sigmoid colon there were seen a few  scattered diverticula, no other abnormalities.  The rectum appeared normal,  and retroflexed view of the anus revealed no obvious internal hemorrhoids.  The scope was then withdrawn, and the patient returned to the recovery room  in stable condition.  He tolerated procedure well, and there were no  immediate complications.   IMPRESSION:  1.  Left-sided diverticulosis.  2.  Otherwise normal study.   PLAN:  1.  Repeat colonoscopy within 10 years.  2.  Consider sigmoidoscopy Hemoccults in 5 years.      JCH/MEDQ  D:  10/27/2004  T:  10/27/2004  Job:  [id number]   cc:   Dellis Anes. Idell Pickles, M.D.  [address]  [place]  [place] [address]  Fax: [phone]

## 2011-01-12 ENCOUNTER — Encounter: Payer: Self-pay | Admitting: Internal Medicine

## 2011-01-12 ENCOUNTER — Ambulatory Visit (INDEPENDENT_AMBULATORY_CARE_PROVIDER_SITE_OTHER): Payer: Federal, State, Local not specified - PPO | Admitting: Internal Medicine

## 2011-01-12 VITALS — BP 124/86 | HR 66 | Temp 98.4°F | Wt 278.6 lb

## 2011-01-12 DIAGNOSIS — I10 Essential (primary) hypertension: Secondary | ICD-10-CM

## 2011-01-12 DIAGNOSIS — M549 Dorsalgia, unspecified: Secondary | ICD-10-CM

## 2011-01-12 DIAGNOSIS — M5412 Radiculopathy, cervical region: Secondary | ICD-10-CM

## 2011-01-12 DIAGNOSIS — E119 Type 2 diabetes mellitus without complications: Secondary | ICD-10-CM

## 2011-01-12 LAB — CBC WITH DIFFERENTIAL/PLATELET
Eosinophils Relative: 1.3 % (ref 0.0–5.0)
HCT: 45.7 % (ref 39.0–52.0)
Hemoglobin: 15.4 g/dL (ref 13.0–17.0)
Lymphs Abs: 2.6 10*3/uL (ref 0.7–4.0)
Monocytes Relative: 12.1 % — ABNORMAL HIGH (ref 3.0–12.0)
Neutro Abs: 4.3 10*3/uL (ref 1.4–7.7)
Platelets: 287 10*3/uL (ref 150.0–400.0)
WBC: 8 10*3/uL (ref 4.5–10.5)

## 2011-01-12 LAB — POCT URINALYSIS DIPSTICK
Bilirubin, UA: NEGATIVE
Ketones, UA: NEGATIVE
Leukocytes, UA: NEGATIVE

## 2011-01-12 LAB — SEDIMENTATION RATE: Sed Rate: 10 mm/hr (ref 0–22)

## 2011-01-12 MED ORDER — GABAPENTIN 100 MG PO CAPS: 100.0000 mg | ORAL_CAPSULE | ORAL | Status: DC

## 2011-01-12 MED ORDER — TRAMADOL HCL 50 MG PO TABS: 50.0000 mg | ORAL_TABLET | Freq: Four times a day (QID) | ORAL | Status: AC | PRN

## 2011-01-12 NOTE — Assessment & Plan Note (Signed)
FBS 90 on average

## 2011-01-12 NOTE — Progress Notes (Signed)
Subjective:    Patient ID: Evan [name], male    DOB: 1956-01-18, 55 y.o.   MRN: [id number]  HPI #[id number] : Onset: sat  7/28   Location: L temple  Quality: sharp, burning Frequency: intermittently  Duration: few seconds  Precipitating factors: head movement; exposure to A/C  Prior treatment: Aleve w/o benefit Associated Symptoms Nausea/vomiting: no  Photophobia/phonophobia: no  Tearing of eyes: yes  Sinus pain/pressure: no  PMH  migraine: yes; similar to prior non migrainous  headaches Red Flags Fever: no  Neck pain/stiffness: no  Vision/speech/swallow/hearing difficulty: no  Focal weakness/numbness: no  Altered mental status: no  Trauma: no  New type of headache: no  Anticoagulant use: no       Review of Systems The major and minor symptoms of rhinosinusitis were reviewed. He has had minor  nasal congestion/obstruction w/o  nasal purulence; facial pain; anosmia; halitosis; earache and dental pain. FBS 90 on average  He has had lumbosacral pain on the right for several months. He is worried about his blood pressure medicine causing kidney stones.    Objective:   Physical Exam  Gen.: Healthy and well-nourished in appearance. Appears uncomfortable Head: Normocephalic without obvious abnormalities Eyes: No corneal or conjunctival inflammation noted. Pupils equal round reactive to light and accommodation. FOV WNL. EOMI. Vision grossly normal. Ears: External  ear exam reveals no significant lesions or deformities. Canals clear .TMs normal. Hearing is grossly normal bilaterally. Minor scarring in the right tympanic membrane. Nose: External nasal exam reveals no deformity or inflammation. Nasal mucosa are pink and moist. No lesions or exudates noted. Septum  Deviated to R  Mouth: Oral mucosa and oropharynx reveal no lesions or exudates. Teeth in good repair. Neck: No deformities, masses, or tenderness noted. Range of motion  normal. Lungs: Normal respiratory effort; chest  expands symmetrically. Lungs are clear to auscultation without rales, wheezes, or increased work of breathing. Heart: Normal rate and rhythm. Normal S1 and S2. No gallop, click, or rub. No murmur.                                                                                  Musculoskeletal/extremities:  No clubbing, cyanosis, edema, or deformity noted. Tone & strength  normal.Joints normal. Nail health  Good. He lay back & sat up w/o help. Neg SLR  Vascular: Carotid, radial artery  pulses are full and equal. No bruits present. Neurologic: Affect slightly flat  ;oriented x3. Deep tendon reflexes symmetrical and normal. Gait (heel/toe), Romberg testing, and finger-nose exams are normal. Cranial nerve exam reveals no deficit.        Skin: Intact without suspicious lesions or rashes. The left temple is tender to palpation; no rash is noted. Lymph: tender isolated left upper cervical lymph node tenderness w/o  axillary lymphadenopathy present. Psych: Mood and affect  As notedl.  Assessment & Plan:   #1 temporal neuralgia type pain; no neuromuscular deficits  #2 tender localized left cervical lymphadenopathy; no evidence of herpes zoster at this time  #3 hypertension controlled  #4 diabetes controlled based on fasting blood sugar results  #5 LBS  Plan: See orders and instructions.

## 2011-01-12 NOTE — Patient Instructions (Signed)

## 2011-01-12 NOTE — Assessment & Plan Note (Signed)
BP in 120s/ 80s @ home

## 2011-01-27 ENCOUNTER — Ambulatory Visit: Payer: Federal, State, Local not specified - PPO | Admitting: Internal Medicine

## 2011-01-27 ENCOUNTER — Ambulatory Visit (INDEPENDENT_AMBULATORY_CARE_PROVIDER_SITE_OTHER): Payer: Federal, State, Local not specified - PPO | Admitting: Family

## 2011-01-27 ENCOUNTER — Encounter: Payer: Self-pay | Admitting: Family

## 2011-01-27 VITALS — BP 120/76 | HR 70 | Temp 98.2°F | Ht 71.0 in | Wt 267.0 lb

## 2011-01-27 DIAGNOSIS — A09 Infectious gastroenteritis and colitis, unspecified: Secondary | ICD-10-CM

## 2011-01-27 DIAGNOSIS — R197 Diarrhea, unspecified: Secondary | ICD-10-CM

## 2011-01-27 MED ORDER — DIPHENOXYLATE-ATROPINE 2.5-0.025 MG PO TABS: 1.0000 | ORAL_TABLET | Freq: Four times a day (QID) | ORAL | Status: AC | PRN

## 2011-01-27 MED ORDER — CIPROFLOXACIN HCL 500 MG PO TABS: 500.0000 mg | ORAL_TABLET | Freq: Two times a day (BID) | ORAL | Status: AC

## 2011-01-27 NOTE — Patient Instructions (Addendum)
Please complete stool studies and return to the lab.  Start antibiotics. Call if you develop worsening abdominal pain, nausea/vomitting, or if your symptoms are not improved in 2-3 days.

## 2011-01-27 NOTE — Assessment & Plan Note (Signed)
55 yr old male with 4 day history of diarrhea following a trip to [place]. Will plan to treat empirically with Cipro and send stool studies.  Lomotil PRN.  Pt instructed to contact us if symptoms worsen or if they don't improve in 2-3 days.

## 2011-01-27 NOTE — Progress Notes (Signed)
Subjective:    Patient ID: Evan [name], male    DOB: [birth year], 55 y.o.   MRN: [id number]  HPI  Evan [name] is a 55 yr old male who presents today with chief complaint of dirrhea.  He reports that he recently completed a 1 week mission trip in [place].  Developed diarrhea late Friday night 01/24/11, and returned home on Saturday night 01/25/11.  He reports that he did not knowingly drink the water or eat any fresh vegetables. Reports  +cramping abdominal pain which is intermittent. Denies associated fever or nausea, black or bloody stools.  He does report  + Anorexia. Tolerating PO's and drinking gatorade.  Urinating regularly.   Review of Systems    see HPI  Past Medical History  Diagnosis Date  . Benign essential tremor     RUE, ophthamlic migraines w/ slurred speech Dr.Reynolds; ED  . Allergic rhinitis   . Hypertension   . Hyperlipidemia   . Depression   . Murmur     aortic sclerosis, SBE  prophlaxis not needed as per cardio  . Diverticulosis of colon     History   Social History  . Marital Status: Married    Spouse Name: N/A    Number of Children: N/A  . Years of Education: N/A   Occupational History  . Not on file.   Social History Main Topics  . Smoking status: Never Smoker   . Smokeless tobacco: Never Used  . Alcohol Use: Yes     extremely rare   . Drug Use: No  . Sexually Active: Not on file   Other Topics Concern  . Not on file   Social History Narrative   Regular exercise- yes walks 1 hr 3-4x/week    Past Surgical History  Procedure Date  . Lumbar laminectomy     L5-S1    Family History  Problem Relation Age of Onset  . Alzheimer's disease Father   . Hypertension Father   . Stroke Father   . Hyperlipidemia Father   . Coronary artery disease Mother   . Prostate cancer Brother   . Diabetes Brother   . Coronary artery disease Maternal Uncle   . Schizophrenia      Allergies  Allergen Reactions  . Penicillins     Respiratory distress as  infant    Current Outpatient Prescriptions on File Prior to Visit  Medication Sig Dispense Refill  . aspirin 81 MG tablet Take 81 mg by mouth daily.        . metFORMIN (GLUCOPHAGE-XR) 500 MG 24 hr tablet TAKE 1 TABLET BY MOUTH TWICE A DAY WITH 2 LARGEST MEALS  180 tablet  0  . Omega-3 Fatty Acids (FISH OIL) 1200 MG CAPS Take by mouth.        . pravastatin (PRAVACHOL) 20 MG tablet Take 20 mg by mouth daily.        . sildenafil (VIAGRA) 100 MG tablet 1/2-1 once daily as needed.       . topiramate (TOPAMAX) 50 MG tablet Take 100 mg by mouth 2 (two) times daily.          BP 120/76  Pulse 70  Temp(Src) 98.2 F (36.8 C) (Oral)  Ht 5\' 11"  (1.803 m)  Wt 267 lb 0.6 oz (121.129 kg)  BMI 37.24 kg/m2  SpO2 95%    Objective:   Physical Exam  Constitutional: He appears well-developed and well-nourished. No distress.  Cardiovascular: Normal rate and regular rhythm.   Pulmonary/Chest: Effort  normal and breath sounds normal.  Abdominal: Soft. He exhibits no distension and no mass. There is no tenderness. There is no rebound and no guarding.       + hypoactive bowel sounds.   Skin: Skin is warm and dry.  Psychiatric: He has a normal mood and affect. His behavior is normal. Judgment and thought content normal.          Assessment & Plan:

## 2011-03-06 LAB — BASIC METABOLIC PANEL
BUN: 10
BUN: 9
CO2: 29
CO2: 30
Chloride: 102
Chloride: 102
Chloride: 104
Creatinine, Ser: 0.97
Creatinine, Ser: 0.98
GFR calc Af Amer: >60
Glucose, Bld: 99
Potassium: 3.5
Sodium: 138

## 2011-03-06 LAB — CBC
HCT: 40.3
HCT: 40.7
HCT: 44
Hemoglobin: 15.2
MCHC: 34.5
MCHC: 34.9
MCV: 91.2
MCV: 91.8
MCV: 92.2
Platelets: 290
Platelets: 308
RBC: 4.37
RBC: 4.79
WBC: 8.1

## 2011-03-06 LAB — DIFFERENTIAL
Eosinophils Absolute: 0.1
Eosinophils Relative: 1
Lymphs Abs: 2.4
Monocytes Absolute: 0.9
Monocytes Relative: 11

## 2011-03-06 LAB — HOMOCYSTEINE: Homocysteine: 7.1

## 2011-03-06 LAB — RAPID URINE DRUG SCREEN, HOSP PERFORMED
Cocaine: NOT DETECTED
Opiates: NOT DETECTED

## 2011-03-06 LAB — HEMOGLOBIN A1C: Hgb A1c MFr Bld: 6.2 — ABNORMAL HIGH

## 2011-03-06 LAB — ETHANOL: Alcohol, Ethyl (B): <5

## 2011-03-06 LAB — CK TOTAL AND CKMB (NOT AT ARMC)
Relative Index: INVALID
Relative Index: INVALID
Relative Index: INVALID
Total CK: 93

## 2011-03-06 LAB — HEPATIC FUNCTION PANEL
ALT: 33
Alkaline Phosphatase: 58
Indirect Bilirubin: 0.7
Total Bilirubin: 0.8

## 2011-03-06 LAB — POCT CARDIAC MARKERS
CKMB, poc: <1 — ABNORMAL LOW
Myoglobin, poc: 77.8
Operator id: 4661
Troponin i, poc: <0.05

## 2011-03-06 LAB — URINE CULTURE: Culture: NO GROWTH

## 2011-03-06 LAB — LIPID PANEL
HDL: 44
Total CHOL/HDL Ratio: 3.5
VLDL: 21

## 2011-03-06 LAB — URINALYSIS, ROUTINE W REFLEX MICROSCOPIC
Glucose, UA: NEGATIVE
Hgb urine dipstick: NEGATIVE
Ketones, ur: NEGATIVE
Protein, ur: NEGATIVE

## 2011-03-15 ENCOUNTER — Other Ambulatory Visit: Payer: Self-pay | Admitting: Internal Medicine

## 2011-04-07 ENCOUNTER — Other Ambulatory Visit: Payer: Self-pay | Admitting: Internal Medicine

## 2011-04-07 MED ORDER — METFORMIN HCL ER 500 MG PO TB24: ORAL_TABLET | ORAL | Status: DC

## 2011-04-07 NOTE — Telephone Encounter (Signed)
a1c 250.00  

## 2011-05-12 ENCOUNTER — Other Ambulatory Visit: Payer: Self-pay | Admitting: Internal Medicine

## 2011-05-14 NOTE — Telephone Encounter (Signed)
Lipid/Hep 272.4/995.20  

## 2011-06-15 ENCOUNTER — Other Ambulatory Visit: Payer: Self-pay | Admitting: Internal Medicine

## 2011-07-24 ENCOUNTER — Other Ambulatory Visit: Payer: Self-pay | Admitting: Internal Medicine

## 2011-07-24 NOTE — Telephone Encounter (Signed)
a1c 250.00  

## 2011-09-01 ENCOUNTER — Other Ambulatory Visit: Payer: Self-pay | Admitting: Internal Medicine

## 2011-09-02 NOTE — Telephone Encounter (Signed)
A1C 250.00 

## 2011-10-11 ENCOUNTER — Other Ambulatory Visit: Payer: Self-pay | Admitting: Internal Medicine

## 2011-10-12 NOTE — Telephone Encounter (Signed)
Patient needs to schedule a CPX  

## 2011-11-20 ENCOUNTER — Other Ambulatory Visit: Payer: Self-pay | Admitting: Internal Medicine

## 2011-11-27 ENCOUNTER — Other Ambulatory Visit: Payer: Self-pay | Admitting: Internal Medicine

## 2011-11-27 NOTE — Telephone Encounter (Signed)
Resend from 6.10.13 not recvd/SLS

## 2012-02-06 ENCOUNTER — Other Ambulatory Visit: Payer: Self-pay | Admitting: Internal Medicine

## 2012-02-08 NOTE — Telephone Encounter (Signed)
A1C 250.00, Patient needs to schedule a CPX ([facility])

## 2012-02-17 ENCOUNTER — Other Ambulatory Visit: Payer: Self-pay | Admitting: Internal Medicine

## 2012-02-17 NOTE — Telephone Encounter (Signed)
Unfortunately this cannot be refilled; he has not been seen for over a year. He has multiple health diagnoses including hypertension, lipids, diabetes with no current assessment. These will be reassessed prior to refill of  this medication.

## 2012-02-17 NOTE — Telephone Encounter (Signed)
Last RX'ed 2011, Last OV 12/2010.

## 2012-04-26 ENCOUNTER — Encounter: Payer: Self-pay | Admitting: Family Medicine

## 2012-04-26 ENCOUNTER — Ambulatory Visit (INDEPENDENT_AMBULATORY_CARE_PROVIDER_SITE_OTHER): Payer: Federal, State, Local not specified - PPO | Admitting: Family Medicine

## 2012-04-26 VITALS — BP 148/88 | HR 84 | Temp 98.5°F | Ht 70.25 in | Wt 303.2 lb

## 2012-04-26 DIAGNOSIS — Z125 Encounter for screening for malignant neoplasm of prostate: Secondary | ICD-10-CM

## 2012-04-26 DIAGNOSIS — J309 Allergic rhinitis, unspecified: Secondary | ICD-10-CM

## 2012-04-26 DIAGNOSIS — F3289 Other specified depressive episodes: Secondary | ICD-10-CM

## 2012-04-26 DIAGNOSIS — Z23 Encounter for immunization: Secondary | ICD-10-CM

## 2012-04-26 DIAGNOSIS — F329 Major depressive disorder, single episode, unspecified: Secondary | ICD-10-CM

## 2012-04-26 DIAGNOSIS — I10 Essential (primary) hypertension: Secondary | ICD-10-CM

## 2012-04-26 DIAGNOSIS — G43809 Other migraine, not intractable, without status migrainosus: Secondary | ICD-10-CM

## 2012-04-26 DIAGNOSIS — N529 Male erectile dysfunction, unspecified: Secondary | ICD-10-CM

## 2012-04-26 DIAGNOSIS — E785 Hyperlipidemia, unspecified: Secondary | ICD-10-CM

## 2012-04-26 DIAGNOSIS — E119 Type 2 diabetes mellitus without complications: Secondary | ICD-10-CM

## 2012-04-26 DIAGNOSIS — G43109 Migraine with aura, not intractable, without status migrainosus: Secondary | ICD-10-CM | POA: Insufficient documentation

## 2012-04-26 LAB — LIPID PANEL
HDL: 63.7 mg/dL (ref >39.00)
Triglycerides: 122 mg/dL (ref 0.0–149.0)

## 2012-04-26 LAB — BASIC METABOLIC PANEL
CO2: 27 mEq/L (ref 19–32)
Calcium: 9.1 mg/dL (ref 8.4–10.5)
Chloride: 103 mEq/L (ref 96–112)
Creatinine, Ser: 1 mg/dL (ref 0.4–1.5)
Glucose, Bld: 128 mg/dL — ABNORMAL HIGH (ref 70–99)
Sodium: 138 mEq/L (ref 135–145)

## 2012-04-26 LAB — MICROALBUMIN / CREATININE URINE RATIO
Creatinine,U: 139.8 mg/dL
Microalb Creat Ratio: 0.7 mg/g (ref 0.0–30.0)
Microalb, Ur: 1 mg/dL (ref 0.0–1.9)

## 2012-04-26 MED ORDER — SILDENAFIL CITRATE 100 MG PO TABS: 50.0000 mg | ORAL_TABLET | ORAL | Status: DC | PRN

## 2012-04-26 MED ORDER — METFORMIN HCL 500 MG PO TABS: 500.0000 mg | ORAL_TABLET | Freq: Two times a day (BID) | ORAL | Status: DC

## 2012-04-26 MED ORDER — GLUCOSE BLOOD VI STRP: ORAL_STRIP | Status: DC

## 2012-04-26 MED ORDER — LISINOPRIL 10 MG PO TABS: 10.0000 mg | ORAL_TABLET | Freq: Every day | ORAL | Status: DC

## 2012-04-26 MED ORDER — PRAVASTATIN SODIUM 20 MG PO TABS: 20.0000 mg | ORAL_TABLET | Freq: Every day | ORAL | Status: DC

## 2012-04-26 NOTE — Patient Instructions (Signed)
Flu and tetanus shots today (Tdap). Blood work today.  We will call you with results. Blood pressure is high today (given diabetes).,  Start lisinopril at 10mg  daily.  Return in about 10 days for recheck blood work (kidneys) Good to see you today, call us with questions.

## 2012-04-26 NOTE — Assessment & Plan Note (Signed)
Refilled viagra

## 2012-04-26 NOTE — Progress Notes (Signed)
Subjective:    Patient ID: Evan [name], male    DOB: December 10, 1955, 56 y.o.   MRN: [id number]  HPI CC: new pt establish  Prior saw Dr. Alwyn Ren at [facility].  HTN - bp at home running high.  170s/85 at home.  Uses relion arm automatic cuff at home.  Currently not on bp meds.    DM- dx 2011.  On metformin 500mg  bid ever since.  Initial A1c 6.2% done 2011.  Has not attended diabetes education.  knows about diabetes.  Last vision exam was done late spring.  Foot exam today.  Checks sugars every morning fasting.  HLD - tolerating pravastatin without myalgias.  R knee pain - for last 6 mo.  Tried wrapping knee.  Didn't help.  Sees neurology (dohmeier) for insomnia and ophthalmic migraines.  Night time congestion, rec try nasal steroid.  Takes afrin nasal spray a few nights a week.  Already having nose bleeds.  Uses nasal saline nightly.  Not interested in singulair.  Preventative: Last CPE 2011.   Colonoscopy - done 2006, diverticulosis, rec rpt 23yrs.  Thinks done at [facility]. Prostate cancer screening - gets PSA done every few years.  Brother with prostate cancer.  Last PSA and DRE was 2011. Flu shot today.  Tetanus shot today (Tdap).  No pneumonia shot in past.  Caffeine: 3 cups coffee/day Lives with wife and daughter, son in college, 2 cats and 1 dog Occupation: Veterinary surgeon for government Edu: masters degree Activity: walks 44min/day  Diet: good water, fruits/vegetables daily.  Keeps record of caloric intake  Medications and allergies reviewed and updated in chart.  Past histories reviewed and updated if relevant as below. Patient Active Problem List  Diagnosis  . DIABETES MELLITUS  . HYPERLIPIDEMIA  . DEPRESSION  . HYPERTENSION  . ALLERGIC RHINITIS  . DIVERTICULOSIS, COLON  . ERECTILE DYSFUNCTION, ORGANIC  . KNEE PAIN, RIGHT  . SHOULDER IMPINGEMENT SYNDROME, RIGHT  . LIBIDO, DECREASED  . Traveler's diarrhea   Past Medical History  Diagnosis Date  .  Benign essential tremor     ophthamlic migraines w/ slurred speech Dr. Vickey Huger; ED  . Hypertension   . Hyperlipidemia   . Depression     intolerant of cymbalta and wellbutrin  . Murmur     aortic sclerosis, SBE  prophlaxis not needed as per cardio  . Diverticulosis of colon   . T2DM (type 2 diabetes mellitus)    Past Surgical History  Procedure Date  . Lumbar laminectomy 1989    L5-S1  . Colonoscopy 2006    diverticulosis per pt   History  Substance Use Topics  . Smoking status: Never Smoker   . Smokeless tobacco: Never Used  . Alcohol Use: Yes     Comment: extremely rare    Family History  Problem Relation Age of Onset  . Alzheimer's disease Father   . Hypertension Father   . Stroke Father   . Hyperlipidemia Father   . Coronary artery disease Brother 73    diabetic, MI  . Prostate cancer Brother   . Diabetes Brother   . Coronary artery disease Maternal Uncle   . Schizophrenia Paternal Uncle   . Emphysema Mother    Allergies  Allergen Reactions  . Penicillins     Respiratory distress as infant   Current Outpatient Prescriptions on File Prior to Visit  Medication Sig Dispense Refill  . aspirin 81 MG tablet Take 81 mg by mouth daily.        Marland Kitchen  topiramate (TOPAMAX) 50 MG tablet Take 100 mg by mouth 2 (two) times daily.        . [DISCONTINUED] pravastatin (PRAVACHOL) 20 MG tablet TAKE 1 TABLET BY MOUTH AT BEDTIME  30 tablet  0  . [DISCONTINUED] sildenafil (VIAGRA) 100 MG tablet 1/2-1 once daily as needed.       . [DISCONTINUED] metFORMIN (GLUCOPHAGE) 500 MG tablet TAKE 1 TABLET BY MOUTH TWICE A DAY WITH 2 LARGEST MEALS  60 tablet  0  . [DISCONTINUED] metFORMIN (GLUCOPHAGE) 500 MG tablet TAKE 1 TABLET BY MOUTH TWICE A DAY WITH 2 LARGEST MEALS  60 tablet  0     Review of Systems  Constitutional: Negative for fever, chills, activity change, appetite change, fatigue and unexpected weight change.  HENT: Negative for hearing loss and neck pain.   Eyes: Negative for  visual disturbance.  Respiratory: Negative for cough, chest tightness, shortness of breath and wheezing.   Cardiovascular: Negative for chest pain, palpitations and leg swelling.  Gastrointestinal: Negative for nausea, vomiting, abdominal pain, diarrhea, constipation, blood in stool and abdominal distention.  Genitourinary: Negative for hematuria and difficulty urinating.  Musculoskeletal: Negative for myalgias and arthralgias.  Skin: Negative for rash.  Neurological: Negative for dizziness, seizures, syncope and headaches.  Hematological: Does not bruise/bleed easily.  Psychiatric/Behavioral: Negative for dysphoric mood. The patient is not nervous/anxious.        Objective:   Physical Exam  Nursing note and vitals reviewed. Constitutional: He is oriented to person, place, and time. He appears well-developed and well-nourished. No distress.       obese  HENT:  Head: Normocephalic and atraumatic.  Right Ear: Hearing, tympanic membrane, external ear and ear canal normal.  Left Ear: Hearing, tympanic membrane, external ear and ear canal normal.  Nose: Septal deviation (to right) present.  Mouth/Throat: Oropharynx is clear and moist. No oropharyngeal exudate.       Irritated nasal mucosa  Eyes: Conjunctivae normal and EOM are normal. Pupils are equal, round, and reactive to light. No scleral icterus.  Neck: Normal range of motion. Neck supple. Carotid bruit is not present.  Cardiovascular: Normal rate, regular rhythm, normal heart sounds and intact distal pulses.   No murmur heard. Pulses:      Radial pulses are 2+ on the right side, and 2+ on the left side.  Pulmonary/Chest: Effort normal and breath sounds normal. No respiratory distress. He has no wheezes. He has no rales.  Genitourinary: Rectum normal and prostate normal. Rectal exam shows no external hemorrhoid, no internal hemorrhoid, no fissure, no mass, no tenderness and anal tone normal. Prostate is not enlarged (20gm) and not  tender.  Musculoskeletal: Normal range of motion. He exhibits no edema.       Diabetic foot exam: Normal inspection No skin breakdown No calluses  Normal DP/PT pulses Normal sensation to light tough and monofilament Nails normal   Lymphadenopathy:    He has no cervical adenopathy.  Neurological: He is alert and oriented to person, place, and time.       CN grossly intact, station and gait intact  Skin: Skin is warm and dry. No rash noted.  Psychiatric: He has a normal mood and affect. His behavior is normal. Judgment and thought content normal.       Assessment & Plan:

## 2012-04-26 NOTE — Assessment & Plan Note (Signed)
Declines pharmacotherapy.  Intolerant of cymbalta and wellbutrin in past.

## 2012-04-26 NOTE — Addendum Note (Signed)
Addended by: Sydell Axon C on: 04/26/2012 01:00 PM   Modules accepted: Orders

## 2012-04-26 NOTE — Assessment & Plan Note (Signed)
Chronic, stable. Checks daily. Check A1c today. Declines diabetic education. On metformin 500mg  bid.  Has been out of recently.  Refilled today.

## 2012-04-26 NOTE — Assessment & Plan Note (Signed)
Elevated today and per pt at home.  Start lisinopril 10mg  daily.  Return in 10 d for recheck Cr.

## 2012-04-26 NOTE — Assessment & Plan Note (Signed)
Chronic, stable. Continue pravastatin at night.  Check FLP today.

## 2012-04-27 ENCOUNTER — Encounter: Payer: Self-pay | Admitting: *Deleted

## 2012-09-13 LAB — HM DIABETES EYE EXAM

## 2012-12-19 ENCOUNTER — Other Ambulatory Visit (INDEPENDENT_AMBULATORY_CARE_PROVIDER_SITE_OTHER): Payer: Federal, State, Local not specified - PPO

## 2012-12-19 DIAGNOSIS — I10 Essential (primary) hypertension: Secondary | ICD-10-CM

## 2012-12-19 LAB — BASIC METABOLIC PANEL
CO2: 30 mEq/L (ref 19–32)
GFR: 88.24 mL/min (ref >60.00)
Glucose, Bld: 135 mg/dL — ABNORMAL HIGH (ref 70–99)
Potassium: 4.3 mEq/L (ref 3.5–5.1)
Sodium: 140 mEq/L (ref 135–145)

## 2012-12-22 ENCOUNTER — Ambulatory Visit (INDEPENDENT_AMBULATORY_CARE_PROVIDER_SITE_OTHER): Payer: Federal, State, Local not specified - PPO | Admitting: Family Medicine

## 2012-12-22 ENCOUNTER — Other Ambulatory Visit: Payer: Self-pay

## 2012-12-22 ENCOUNTER — Encounter: Payer: Self-pay | Admitting: Family Medicine

## 2012-12-22 VITALS — BP 140/80 | HR 65 | Temp 97.8°F | Wt 303.0 lb

## 2012-12-22 DIAGNOSIS — G43809 Other migraine, not intractable, without status migrainosus: Secondary | ICD-10-CM

## 2012-12-22 DIAGNOSIS — Z23 Encounter for immunization: Secondary | ICD-10-CM

## 2012-12-22 DIAGNOSIS — R1013 Epigastric pain: Secondary | ICD-10-CM

## 2012-12-22 DIAGNOSIS — Z0000 Encounter for general adult medical examination without abnormal findings: Secondary | ICD-10-CM

## 2012-12-22 DIAGNOSIS — K219 Gastro-esophageal reflux disease without esophagitis: Secondary | ICD-10-CM | POA: Insufficient documentation

## 2012-12-22 DIAGNOSIS — F329 Major depressive disorder, single episode, unspecified: Secondary | ICD-10-CM

## 2012-12-22 DIAGNOSIS — I10 Essential (primary) hypertension: Secondary | ICD-10-CM

## 2012-12-22 DIAGNOSIS — R5381 Other malaise: Secondary | ICD-10-CM

## 2012-12-22 DIAGNOSIS — G43109 Migraine with aura, not intractable, without status migrainosus: Secondary | ICD-10-CM

## 2012-12-22 DIAGNOSIS — E119 Type 2 diabetes mellitus without complications: Secondary | ICD-10-CM

## 2012-12-22 DIAGNOSIS — E785 Hyperlipidemia, unspecified: Secondary | ICD-10-CM

## 2012-12-22 NOTE — Progress Notes (Signed)
Subjective:    Patient ID: Evan [name], male    DOB: 1955/12/17, 57 y.o.   MRN: [id number]  HPI CC: CPE  States at home blood pressure running 130/70s.  Constant ache in upper abdomen - present for last 6-7 months.  Has tried zantac and generic prilosec.  This has helped reflux temporarily but doesn't help discomfort.  Not food related.  Actually has improved over last 2 weeks.  No nausea/vomiting, dysphagia, no early satiety.  On topamax for ophthalmic migraines.  Doing really well with this.  Easily fatigues with physical exertion.  No significant dyspnea, but does feel tired.  Able to walk 30 min ok, but anything more than this is difficult.  Walks 5d/wk.   Had eval by neurology - states sleep study negative for OSA, but did have snoring issues - recommended oral device.  Has not obtained, unsure where to go for this.  States his dentist does not do this. Body mass index is 43.18 kg/(m^2).  Wt Readings from Last 3 Encounters:  12/22/12 303 lb (137.44 kg)  04/26/12 303 lb 4 oz (137.553 kg)  01/27/11 267 lb 0.6 oz (121.129 kg)  monitors diet closely.  despite this has difficulty losing weight.  DM - stays well controlled.  Fasting sugars 100-120s.  Post prandial 100-120s.    Preventative:  Last CPE 2011.  Colonoscopy - done 2006, diverticulosis, rec rpt 53yrs. Thinks done at [facility].  Prostate cancer screening - gets PSA done every few years. Brother with prostate cancer. Last PSA and DRE was 2011.  Flu shot 04/2012.  Tetanus shot 04/2012 (Tdap).  No pneumonia shot in past.   Seat belt and sunscreen use discussed.  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 Keeps record of caloric intake  Medications and allergies reviewed and updated in chart.  Past histories reviewed and updated if  relevant as below. Patient Active Problem List   Diagnosis Date Noted  . Ophthalmic migraine   . DIABETES MELLITUS 04/22/2010  . KNEE PAIN, RIGHT 04/22/2010  . HYPERLIPIDEMIA 12/03/2009  . DEPRESSION 12/03/2009  . HYPERTENSION 12/03/2009  . Nasal congestion 12/03/2009  . DIVERTICULOSIS, COLON 12/03/2009  . 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 SHOULDER IMPINGEMENT SYNDROME, RIGHT 12/03/2009   Past Medical History  Diagnosis Date  . Hypertension   . Hyperlipidemia   . Depression     intolerant of cymbalta and wellbutrin  . Murmur     aortic sclerosis, SBE  prophlaxis not needed as per cardio  . Diverticulosis of colon   . T2DM (type 2 diabetes mellitus)   . Ophthalmic migraine     with slurred speech (Dohmeier)   Past Surgical History  Procedure Laterality Date  . Lumbar laminectomy  1989    L5-S1  . Colonoscopy  2006    diverticulosis per pt   History  Substance Use Topics  . Smoking status: Never Smoker   . Smokeless tobacco: Never Used  . Alcohol Use: Yes     Comment: extremely rare    Family History  Problem Relation Age of Onset  . Alzheimer's disease Father   . Hypertension Father   . Stroke Father   . Hyperlipidemia Father   . Coronary artery disease Brother 19    diabetic, MI  . Prostate cancer Brother   . Diabetes Brother   .  Coronary artery disease Maternal Uncle   . Schizophrenia Paternal Uncle   . Emphysema Mother    Allergies  Allergen Reactions  . Penicillins     Respiratory distress as infant   Current Outpatient Prescriptions on File Prior to Visit  Medication Sig Dispense Refill  . aspirin 81 MG tablet Take 81 mg by mouth daily.        Marland Kitchen glucose blood (ONE TOUCH TEST STRIPS) test strip 250.00.  Check daily and prn  100 each  12  . metFORMIN (GLUCOPHAGE) 500 MG tablet Take 1 tablet (500 mg total) by mouth 2 (two) times daily with a meal.  180 tablet  3  . pravastatin (PRAVACHOL) 20 MG tablet Take 1 tablet (20 mg total) by mouth at  bedtime.  90 tablet  3  . topiramate (TOPAMAX) 50 MG tablet Take 100 mg by mouth 2 (two) times daily.         No current facility-administered medications on file prior to visit.     Review of Systems  Constitutional: Negative for fever, chills, activity change, appetite change, fatigue and unexpected weight change.  HENT: Negative for hearing loss and neck pain.   Eyes: Negative for visual disturbance.  Respiratory: Negative for cough, chest tightness, shortness of breath and wheezing.   Cardiovascular: Negative for chest pain, palpitations and leg swelling.  Gastrointestinal: Positive for abdominal pain (see HPI). Negative for nausea, vomiting, diarrhea, constipation, blood in stool and abdominal distention.  Genitourinary: Negative for hematuria and difficulty urinating.  Musculoskeletal: Negative for myalgias and arthralgias.  Skin: Negative for rash.  Neurological: Negative for dizziness, seizures, syncope and headaches.  Hematological: Negative for adenopathy. Does not bruise/bleed easily.  Psychiatric/Behavioral: Negative for dysphoric mood. The patient is not nervous/anxious.        Objective:   Physical Exam  Nursing note and vitals reviewed. Constitutional: He is oriented to person, place, and time. He appears well-developed and well-nourished. No distress.  obese  HENT:  Head: Normocephalic and atraumatic.  Right Ear: External ear normal.  Left Ear: External ear normal.  Nose: Nose normal.  Mouth/Throat: Oropharynx is clear and moist. No oropharyngeal exudate.  Eyes: Conjunctivae and EOM are normal. Pupils are equal, round, and reactive to light. No scleral icterus.  Neck: Normal range of motion. Neck supple. No thyromegaly present.  Cardiovascular: Normal rate, regular rhythm, normal heart sounds and intact distal pulses.   No murmur heard. Pulses:      Radial pulses are 2+ on the right side, and 2+ on the left side.  Pulmonary/Chest: Effort normal and breath sounds  normal. No respiratory distress. He has no wheezes. He has no rales.  Abdominal: Soft. Bowel sounds are normal. He exhibits no distension and no mass. There is no tenderness. There is no rebound and no guarding.  Genitourinary:  deferred  Musculoskeletal: Normal range of motion. He exhibits no edema.  Diabetic foot exam: Normal inspection No skin breakdown No calluses  Normal DP/PT pulses Normal sensation to light touch and monofilament Nails normal  Lymphadenopathy:    He has no cervical adenopathy.  Neurological: He is alert and oriented to person, place, and time.  CN grossly intact, station and gait intact  Skin: Skin is warm and dry. No rash noted.  Psychiatric: He has a normal mood and affect. His behavior is normal. Judgment and thought content normal.      Assessment & Plan:

## 2012-12-22 NOTE — Assessment & Plan Note (Signed)
seasonal - states this is when work is most difficult. Declines pharmacotherapy.

## 2012-12-22 NOTE — Assessment & Plan Note (Signed)
Stable on topamax.   

## 2012-12-22 NOTE — Assessment & Plan Note (Signed)
Good control on pravastatin nightly.

## 2012-12-22 NOTE — Assessment & Plan Note (Signed)
I've asked him to return in 3 months to recheck A1c. Anticipate good control. Foot exam today.

## 2012-12-22 NOTE — Patient Instructions (Addendum)
Pneumovax today. Good to see you today, call us with questions. Continue watching blood pressure as up to now. Return in 2-3 months for A1c (lab visit). Return in 1 year for next physical, prior fasting for blood work. Watch abdominal discomfort - if persistent or recurring, let me know for abdominal ultrasound.

## 2012-12-22 NOTE — Assessment & Plan Note (Signed)
Preventative protocols reviewed and updated unless pt declined. Discussed healthy diet and lifestyle.  UTD colonoscopy.  Had recent prostate exam in 04/2012.  Will repeat next physical.

## 2012-12-22 NOTE — Assessment & Plan Note (Signed)
States at home good control.  Never filled lisinopril 10mg .

## 2012-12-22 NOTE — Assessment & Plan Note (Signed)
Discussed options of further evaluation for this - as improved, pt desires to monitor for now.

## 2013-01-06 ENCOUNTER — Other Ambulatory Visit: Payer: Self-pay | Admitting: Neurology

## 2013-02-10 ENCOUNTER — Encounter: Payer: Self-pay | Admitting: Family Medicine

## 2013-02-10 DIAGNOSIS — I10 Essential (primary) hypertension: Secondary | ICD-10-CM

## 2013-02-11 MED ORDER — LISINOPRIL 10 MG PO TABS: 10.0000 mg | ORAL_TABLET | Freq: Every day | ORAL | Status: DC

## 2013-02-11 NOTE — Telephone Encounter (Signed)
See mychart message.  plz schedule lab visit and appointments.

## 2013-02-14 NOTE — Telephone Encounter (Signed)
Patient notified and appts scheduled

## 2013-02-27 ENCOUNTER — Other Ambulatory Visit: Payer: Self-pay | Admitting: Family Medicine

## 2013-02-27 ENCOUNTER — Other Ambulatory Visit (INDEPENDENT_AMBULATORY_CARE_PROVIDER_SITE_OTHER): Payer: Federal, State, Local not specified - PPO

## 2013-02-27 DIAGNOSIS — R5381 Other malaise: Secondary | ICD-10-CM

## 2013-02-27 DIAGNOSIS — E119 Type 2 diabetes mellitus without complications: Secondary | ICD-10-CM

## 2013-02-27 DIAGNOSIS — I10 Essential (primary) hypertension: Secondary | ICD-10-CM

## 2013-02-27 LAB — VITAMIN B12: Vitamin B-12: 310 pg/mL (ref 211–911)

## 2013-02-27 MED ORDER — CYANOCOBALAMIN 500 MCG PO TABS: 500.0000 ug | ORAL_TABLET | Freq: Every day | ORAL | Status: DC

## 2013-04-07 ENCOUNTER — Other Ambulatory Visit: Payer: Self-pay | Admitting: Family Medicine

## 2013-04-12 ENCOUNTER — Other Ambulatory Visit: Payer: Self-pay | Admitting: Family Medicine

## 2013-04-20 ENCOUNTER — Other Ambulatory Visit: Payer: Self-pay

## 2013-04-28 ENCOUNTER — Other Ambulatory Visit: Payer: Self-pay | Admitting: Family Medicine

## 2013-05-13 ENCOUNTER — Other Ambulatory Visit: Payer: Self-pay | Admitting: Neurology

## 2013-05-16 ENCOUNTER — Ambulatory Visit: Payer: Federal, State, Local not specified - PPO | Admitting: Family Medicine

## 2013-05-17 ENCOUNTER — Encounter: Payer: Self-pay | Admitting: Family Medicine

## 2013-05-17 ENCOUNTER — Ambulatory Visit (INDEPENDENT_AMBULATORY_CARE_PROVIDER_SITE_OTHER): Payer: Federal, State, Local not specified - PPO | Admitting: Family Medicine

## 2013-05-17 VITALS — BP 126/78 | HR 73 | Temp 97.9°F | Ht 71.0 in | Wt 289.0 lb

## 2013-05-17 DIAGNOSIS — R1013 Epigastric pain: Secondary | ICD-10-CM

## 2013-05-17 DIAGNOSIS — G43809 Other migraine, not intractable, without status migrainosus: Secondary | ICD-10-CM

## 2013-05-17 DIAGNOSIS — E785 Hyperlipidemia, unspecified: Secondary | ICD-10-CM

## 2013-05-17 DIAGNOSIS — K3189 Other diseases of stomach and duodenum: Secondary | ICD-10-CM

## 2013-05-17 DIAGNOSIS — E119 Type 2 diabetes mellitus without complications: Secondary | ICD-10-CM

## 2013-05-17 DIAGNOSIS — G43109 Migraine with aura, not intractable, without status migrainosus: Secondary | ICD-10-CM

## 2013-05-17 DIAGNOSIS — I10 Essential (primary) hypertension: Secondary | ICD-10-CM

## 2013-05-17 LAB — BASIC METABOLIC PANEL
BUN: 13 mg/dL (ref 6–23)
Chloride: 106 mEq/L (ref 96–112)
Creatinine, Ser: 1 mg/dL (ref 0.4–1.5)
GFR: 81.03 mL/min (ref >60.00)
Glucose, Bld: 121 mg/dL — ABNORMAL HIGH (ref 70–99)
Potassium: 4.3 mEq/L (ref 3.5–5.1)

## 2013-05-17 LAB — LIPID PANEL
Cholesterol: 165 mg/dL (ref 0–200)
Triglycerides: 87 mg/dL (ref 0.0–149.0)
VLDL: 17.4 mg/dL (ref 0.0–40.0)

## 2013-05-17 MED ORDER — TOPIRAMATE 50 MG PO TABS: 50.0000 mg | ORAL_TABLET | Freq: Two times a day (BID) | ORAL | Status: DC

## 2013-05-17 MED ORDER — OMEPRAZOLE 40 MG PO CPDR: 40.0000 mg | DELAYED_RELEASE_CAPSULE | Freq: Every day | ORAL | Status: DC | PRN

## 2013-05-17 NOTE — Assessment & Plan Note (Signed)
sxs consistent with GERD - will treat with omeprazole 40mg  course  Educated on GERD diet and lifestyle changes. No red flags. nexium 40mg  samples provided today (3 bottles of 5 cap/bottle).

## 2013-05-17 NOTE — Assessment & Plan Note (Signed)
Chronic, stable. Foot exam today.  Doing well overall, too early for rpt A1c but anticipate stable control based on recall cbg's.

## 2013-05-17 NOTE — Assessment & Plan Note (Signed)
Refilled topamax

## 2013-05-17 NOTE — Assessment & Plan Note (Signed)
Chronic, stable. Continue pravastatin. Check FLP today.  

## 2013-05-17 NOTE — Assessment & Plan Note (Signed)
Chronic, stable. Great control today.  Continue lisinopril.

## 2013-05-17 NOTE — Patient Instructions (Signed)
Good job with the weight loss! Blood work today. GERD instructions: Head of bed elevated. Avoidance of citrus, fatty foods, chocolate, peppermint, and excessive alcohol, along with sodas, orange juice (acidic drinks) At least a few hours between dinner and bed, minimize naps after eating. Take omeprazole 40mg  daily for 2 weeks then as needed.  Samples provided today.

## 2013-05-17 NOTE — Progress Notes (Signed)
   Subjective:    Patient ID: Evan [name], male    DOB: 04/24/1956, 57 y.o.   MRN: [id number]  HPI CC: f/u visit DM  HTN - bp had increased to 160s/100s, so changed diet.  No HA, vision changes, CP/tightness, SOB, leg swelling.   DM - sugars were increasing to 160 fasting, so changed diet to high protein and low carb diet.  Now fasting sugars closer to 110.  No paresthesias. Checks sugars once daily.  Last DM eye exam was Dr. Emily Filbert in Spring 2014.  No low sugars or hypoglycemic sxs.    GERD sxs - noticing worsening over last several months.  Started as epigastric pain, non nausea, then at night time feeling reflux sxs.  Has been taking nexium for this PRN which helps.  Tried prilosec, mylanta without good relief.  Thinks has tried zantac as well which didn't really help.  Noticed cinnamon capsules worsened heartburn.  No fevers/chills, nausea/vomiting, diarrhea/constipation  HLD - tolerating pravastatin well.  Flu shot done.  Wt Readings from Last 3 Encounters:  05/17/13 289 lb (131.09 kg)  12/22/12 303 lb (137.44 kg)  04/26/12 303 lb 4 oz (137.553 kg)   Past Medical History  Diagnosis Date  . Hypertension   . Hyperlipidemia   . Depression     intolerant of cymbalta and wellbutrin  . Murmur     aortic sclerosis, SBE  prophlaxis not needed as per cardio  . Diverticulosis of colon   . T2DM (type 2 diabetes mellitus)   . Ophthalmic migraine     with slurred speech (Dohmeier)     Review of Systems Per HPI    Objective:   Physical Exam  Nursing note and vitals reviewed. Constitutional: He appears well-developed and well-nourished. No distress.  HENT:  Head: Normocephalic and atraumatic.  Mouth/Throat: Oropharynx is clear and moist. No oropharyngeal exudate.  Eyes: Conjunctivae and EOM are normal. Pupils are equal, round, and reactive to light. No scleral icterus.  Neck: Normal range of motion. Neck supple.  Cardiovascular: Normal rate, regular rhythm, normal heart sounds  and intact distal pulses.   No murmur heard. Pulmonary/Chest: Effort normal and breath sounds normal. No respiratory distress. He has no wheezes. He has no rales.  Abdominal: Soft. Bowel sounds are normal. He exhibits no distension and no mass. There is no hepatosplenomegaly. There is no tenderness. There is no rigidity, no rebound, no guarding, no CVA tenderness and negative Murphy's sign.  Musculoskeletal: He exhibits no edema.  Diabetic foot exam: Normal inspection No skin breakdown No calluses  Normal DP/PT pulses Normal sensation to light touch and monofilament Nails normal  Lymphadenopathy:    He has no cervical adenopathy.  Skin: Skin is warm and dry. No rash noted.  Psychiatric: He has a normal mood and affect.       Assessment & Plan:

## 2013-05-17 NOTE — Progress Notes (Signed)
Pre-visit discussion using our clinic review tool. No additional management support is needed unless otherwise documented below in the visit note.  

## 2013-06-13 ENCOUNTER — Other Ambulatory Visit: Payer: Self-pay | Admitting: Family Medicine

## 2013-08-05 ENCOUNTER — Other Ambulatory Visit: Payer: Self-pay | Admitting: Family Medicine

## 2013-08-10 ENCOUNTER — Other Ambulatory Visit: Payer: Self-pay | Admitting: Family Medicine

## 2013-09-21 ENCOUNTER — Other Ambulatory Visit: Payer: Self-pay

## 2013-10-09 ENCOUNTER — Encounter: Payer: Self-pay | Admitting: Family Medicine

## 2013-10-09 ENCOUNTER — Ambulatory Visit (INDEPENDENT_AMBULATORY_CARE_PROVIDER_SITE_OTHER): Payer: Federal, State, Local not specified - PPO | Admitting: Family Medicine

## 2013-10-09 VITALS — BP 126/84 | HR 68 | Temp 98.1°F | Wt 284.5 lb

## 2013-10-09 DIAGNOSIS — I10 Essential (primary) hypertension: Secondary | ICD-10-CM

## 2013-10-09 DIAGNOSIS — E785 Hyperlipidemia, unspecified: Secondary | ICD-10-CM

## 2013-10-09 DIAGNOSIS — M25569 Pain in unspecified knee: Secondary | ICD-10-CM

## 2013-10-09 DIAGNOSIS — M25561 Pain in right knee: Secondary | ICD-10-CM

## 2013-10-09 DIAGNOSIS — E119 Type 2 diabetes mellitus without complications: Secondary | ICD-10-CM

## 2013-10-09 DIAGNOSIS — G8929 Other chronic pain: Secondary | ICD-10-CM | POA: Insufficient documentation

## 2013-10-09 LAB — BASIC METABOLIC PANEL
BUN: 14 mg/dL (ref 6–23)
CO2: 26 meq/L (ref 19–32)
Calcium: 9.4 mg/dL (ref 8.4–10.5)
Chloride: 104 mEq/L (ref 96–112)
Creatinine, Ser: 1 mg/dL (ref 0.4–1.5)
GFR: 82.83 mL/min (ref >60.00)
GLUCOSE: 115 mg/dL — AB (ref 70–99)
POTASSIUM: 4.1 meq/L (ref 3.5–5.1)
SODIUM: 139 meq/L (ref 135–145)

## 2013-10-09 LAB — HEMOGLOBIN A1C: Hgb A1c MFr Bld: 6.3 % (ref 4.6–6.5)

## 2013-10-09 NOTE — Assessment & Plan Note (Signed)
Chronic, stable. Continue pravastatin. 

## 2013-10-09 NOTE — Assessment & Plan Note (Signed)
Chronic, stable. Continue lisinopril.  

## 2013-10-09 NOTE — Progress Notes (Signed)
BP 126/84  Pulse 68  Temp(Src) 98.1 F (36.7 C) (Oral)  Wt 284 lb 8 oz (129.048 kg)   CC: DM f/u  Subjective:    Patient ID: Evan [name], male    DOB: 07-18-1955, 58 y.o.   MRN: [id number]  HPI: Evan [name] is a 58 y.o. male presenting on 10/09/2013 for Follow-up   DM - regularly does check sugars daily, running slightly high 108-118 fasting.  Compliant with antihyperglycemic regimen which includes: metformin 1000mg  bid.  Continues to adhere to low carb diet.  Denies low sugars or hypoglycemic symptoms.  Denies paresthesias. Last diabetic eye exam 09/2012.  Pneumovax: 12/2012.  Prevnar: not done yet.  HTN - Compliant with current antihypertensive regimen of lisinopril 10mg  daily.  Does check blood pressures at home: running well controlled as well.  No low blood pressure readings or symptoms of dizziness/syncope.  Denies HA, vision changes, CP/tightness, SOB, leg swelling.    HLD - compliant with pravastatin 20mg  daily.  Denies myalgias  R knee pain - chronic, comes and goes, worse with exertion.  Describes occasional locking of knee and instability of knee.  No inciting trauma endorsed.  Ongoing for months.  Wt Readings from Last 3 Encounters:  10/09/13 284 lb 8 oz (129.048 kg)  05/17/13 289 lb (131.09 kg)  12/22/12 303 lb (137.44 kg)   Body mass index is 39.7 kg/(m^2).  Relevant past medical, surgical, family and social history reviewed and updated as indicated.  Allergies and medications reviewed and updated. Current Outpatient Prescriptions on File Prior to Visit  Medication Sig  . aspirin 81 MG tablet Take 81 mg by mouth daily.    Marland Kitchen glucose blood (ONE TOUCH TEST STRIPS) test strip 250.00.  Check daily and prn  . lisinopril (PRINIVIL,ZESTRIL) 10 MG tablet TAKE 1 TABLET (10 MG TOTAL) BY MOUTH DAILY.  . metFORMIN (GLUCOPHAGE) 500 MG tablet TAKE 1 TABLET (500 MG TOTAL) BY MOUTH 2 (TWO) TIMES DAILY WITH A MEAL.  . Multiple Vitamins-Minerals (MULTIVITAMIN PO) Take 1 tablet  by mouth daily.  Marland Kitchen omeprazole (PRILOSEC) 40 MG capsule Take 1 capsule (40 mg total) by mouth daily as needed.  . pravastatin (PRAVACHOL) 20 MG tablet TAKE 1 TABLET (20 MG TOTAL) BY MOUTH AT BEDTIME.  . sildenafil (VIAGRA) 100 MG tablet Take 50-100 mg by mouth as needed for erectile dysfunction.  . topiramate (TOPAMAX) 50 MG tablet Take 1 tablet (50 mg total) by mouth 2 (two) times daily.   No current facility-administered medications on file prior to visit.    Review of Systems Per HPI unless specifically indicated above    Objective:    BP 126/84  Pulse 68  Temp(Src) 98.1 F (36.7 C) (Oral)  Wt 284 lb 8 oz (129.048 kg)  Physical Exam  Nursing note and vitals reviewed. Constitutional: He appears well-developed and well-nourished. No distress.  HENT:  Head: Normocephalic and atraumatic.  Right Ear: External ear normal.  Left Ear: External ear normal.  Nose: Nose normal.  Mouth/Throat: Oropharynx is clear and moist. No oropharyngeal exudate.  Eyes: Conjunctivae and EOM are normal. Pupils are equal, round, and reactive to light. No scleral icterus.  Neck: Normal range of motion. Neck supple. No thyromegaly present.  Cardiovascular: Normal rate, regular rhythm, normal heart sounds and intact distal pulses.   No murmur heard. Pulmonary/Chest: Effort normal and breath sounds normal. No respiratory distress. He has no wheezes. He has no rales.  Musculoskeletal: He exhibits no edema.  Diabetic foot exam: Normal  inspection No skin breakdown No calluses  Normal DP/PT pulses Normal sensation to light tougch and monofilament Nails normal  L knee WNL R knee - tender at lat/medial joint lines but not otherwise.  No popliteal fullness.  Neg mcmurray's.  FROM ext/flexion  Lymphadenopathy:    He has no cervical adenopathy.  Skin: Skin is warm and dry. No rash noted.  Psychiatric: He has a normal mood and affect.       Assessment & Plan:   Problem List Items Addressed This Visit    Diabetes type 2, controlled - Primary     Overall good control. Check A1c today, titrate meds accordingly.    Relevant Orders      Ambulatory referral to diabetic education      Basic metabolic panel      Hemoglobin A1c   HYPERLIPIDEMIA     Chronic, stable. Continue pravastatin.    HYPERTENSION     Chronic, stable. Continue lisinopril.    Chronic pain of right knee     Possible meniscal degeneration given sxs endorsed.  Provided with meniscal exercises from [facility] pt advisor.  Pt will return when in active flare for further evaluation.        Follow up plan: Return in about 4 months (around 02/08/2014), or as needed, for physical.

## 2013-10-09 NOTE — Assessment & Plan Note (Signed)
Overall good control. Check A1c today, titrate meds accordingly.

## 2013-10-09 NOTE — Patient Instructions (Addendum)
Speak with Rosaria Ferries or Vaughan Basta to schedule diabetes education classes. Blood work today. Return in 4-6 months for physical, sooner if needed.

## 2013-10-09 NOTE — Progress Notes (Signed)
Pre visit review using our clinic review tool, if applicable. No additional management support is needed unless otherwise documented below in the visit note. 

## 2013-10-09 NOTE — Assessment & Plan Note (Signed)
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  Provided with meniscal exercises from [facility] pt advisor.  Pt will return when in active flare for further evaluation.

## 2013-10-10 ENCOUNTER — Encounter: Payer: Self-pay | Admitting: *Deleted

## 2013-10-10 ENCOUNTER — Telehealth: Payer: Self-pay | Admitting: Family Medicine

## 2013-10-10 ENCOUNTER — Encounter: Payer: Federal, State, Local not specified - PPO | Attending: Family Medicine | Admitting: *Deleted

## 2013-10-10 VITALS — Ht 71.0 in | Wt 269.0 lb

## 2013-10-10 DIAGNOSIS — E119 Type 2 diabetes mellitus without complications: Secondary | ICD-10-CM | POA: Insufficient documentation

## 2013-10-10 DIAGNOSIS — Z713 Dietary counseling and surveillance: Secondary | ICD-10-CM | POA: Insufficient documentation

## 2013-10-10 NOTE — Progress Notes (Signed)
Appt start time: 0930 end time:  1100.  Assessment:  Patient was seen on  10/10/13 for individual diabetes education. Lives with wife, he shops and cooks the foods. 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 He works 8-5 PM Monday through Friday. He enjoys reading . He SMBG once a day with range of 106-160 mg/dl. He wears a Ecologist, he gets his exercise through walking at lunch and during breaks, then the rest in the evening time.  Current HbA1c: 6.3%  Preferred Learning Style:   Visual  Learning Readiness:   Ready  Change in progress  MEDICATIONS: see list, diabetes medication is Metformin  DIETARY INTAKE:  24-hr recall:  B ( AM): 1/2 bowl of Fiber One and 1/2 bowl Special K, 2/3 cup 1% milk and Green tea with Stevia  Snk ( AM): 100 calorie package of Almonds  L ( PM): brings from home: 2 hard boiled eggs, yogurt, fresh fruit, water Snk ( PM): protein bar D ( PM): lean meat, brown rice occasionally, vegetables, salads, small amount of Ranch dressing, red wine and green tea Snk ( PM): Pistacchio nuts, slice of cheese Beverages: water, green tea, coffee black  Usual physical activity: walks every day  Estimated energy needs: 1600 calories 180 g carbohydrates 120 g protein 44 g fat  Progress Towards Goal(s):  In progress.   Nutritional Diagnosis:  NB-1.1 Food and nutrition-related knowledge deficit As related to diabetes management.  As evidenced by too few carb choices for adequate nutrition and weight loss.    Intervention:  Nutrition counseling provided.  Discussed diabetes disease process and treatment options.  Discussed physiology of diabetes and role of obesity on insulin resistance.  Encouraged moderate weight reduction to improve glucose levels.  Discussed role of medications and diet in glucose control  Provided education on macronutrients on glucose levels.  Provided education on carb counting, importance of regularly scheduled  meals/snacks, and meal planning  Discussed effects of physical activity on glucose levels and long-term glucose control.  Recommended he continue with his 150 minutes of physical activity/week.  Reviewed patient medications.  Discussed role of medication on blood glucose and possible side effects  Discussed blood glucose monitoring and interpretation.  Discussed recommended target ranges and individual ranges.    Described short-term complications: hyper- and hypo-glycemia.  Discussed causes,symptoms, and treatment options.  Discussed prevention, detection, and treatment of long-term complications.  Discussed the role of prolonged elevated glucose levels on body systems.  Discussed role of stress on blood glucose levels and discussed strategies to manage psychosocial issues.  Discussed recommendations for long-term diabetes self-care.  Provided checklist for medical, dental, and emotional self-care.  Plan:  Aim for 3 Carb Choices per meal (45 grams) +/- 1 either way  Aim for 0-1 Carbs per snack if hungry  Include protein in moderation with your meals and snacks Continue reading food labels for Total Carbohydrate and Fat Grams of foods Continue  with your activity level by walking daily as tolerated Continue checking BG at alternate times per day as directed by MD  Teaching Method Utilized: Visual, Auditory and Hands on  Handouts given during visit include: Living Well with Diabetes Carb Counting and Food Label handouts Meal Plan Card Macronutrient Chart for variety of calorie levels  Barriers to learning/adherence to lifestyle change: none  Diabetes self-care support plan:   [facility] support group  Demonstrated degree of understanding via:  Teach Back   Monitoring/Evaluation:  Dietary intake, exercise, reading food  labels, SMBG, and body weight prn.

## 2013-10-10 NOTE — Patient Instructions (Signed)
Plan:  Aim for 3 Carb Choices per meal (45 grams) +/- 1 either way  Aim for 0-1 Carbs per snack if hungry  Include protein in moderation with your meals and snacks Continue reading food labels for Total Carbohydrate and Fat Grams of foods Continue  with your activity level by walking daily as tolerated Continue checking BG at alternate times per day as directed by MD

## 2013-10-10 NOTE — Telephone Encounter (Signed)
Relevant patient education assigned to patient using Emmi. ° °

## 2013-10-13 ENCOUNTER — Telehealth: Payer: Self-pay

## 2013-10-13 NOTE — Telephone Encounter (Signed)
Relevant patient education assigned to patient using Emmi. ° °

## 2013-10-28 ENCOUNTER — Other Ambulatory Visit: Payer: Self-pay | Admitting: Family Medicine

## 2013-11-11 ENCOUNTER — Other Ambulatory Visit: Payer: Self-pay | Admitting: Family Medicine

## 2014-02-01 ENCOUNTER — Other Ambulatory Visit: Payer: Self-pay | Admitting: *Deleted

## 2014-02-01 MED ORDER — OMEPRAZOLE 40 MG PO CPDR: DELAYED_RELEASE_CAPSULE | ORAL | Status: DC

## 2014-03-26 ENCOUNTER — Other Ambulatory Visit: Payer: Self-pay | Admitting: Family Medicine

## 2014-03-26 ENCOUNTER — Encounter: Payer: Self-pay | Admitting: Family Medicine

## 2014-03-26 ENCOUNTER — Ambulatory Visit (INDEPENDENT_AMBULATORY_CARE_PROVIDER_SITE_OTHER): Payer: Federal, State, Local not specified - PPO | Admitting: Family Medicine

## 2014-03-26 VITALS — BP 140/82 | HR 88 | Temp 98.6°F | Wt 286.2 lb

## 2014-03-26 DIAGNOSIS — Z23 Encounter for immunization: Secondary | ICD-10-CM

## 2014-03-26 DIAGNOSIS — E119 Type 2 diabetes mellitus without complications: Secondary | ICD-10-CM

## 2014-03-26 DIAGNOSIS — L280 Lichen simplex chronicus: Secondary | ICD-10-CM | POA: Insufficient documentation

## 2014-03-26 DIAGNOSIS — Z125 Encounter for screening for malignant neoplasm of prostate: Secondary | ICD-10-CM

## 2014-03-26 DIAGNOSIS — B356 Tinea cruris: Secondary | ICD-10-CM

## 2014-03-26 DIAGNOSIS — F418 Other specified anxiety disorders: Secondary | ICD-10-CM

## 2014-03-26 LAB — PSA: PSA: 0.63 ng/mL (ref 0.10–4.00)

## 2014-03-26 LAB — HEMOGLOBIN A1C: HEMOGLOBIN A1C: 6.6 % — AB (ref 4.6–6.5)

## 2014-03-26 MED ORDER — MICONAZOLE NITRATE POWD: 1.0000 "application " | Freq: Every day | Status: DC

## 2014-03-26 NOTE — Progress Notes (Signed)
BP 140/82  Pulse 88  Temp(Src) 98.6 F (37 C) (Oral)  Wt 286 lb 4 oz (129.842 kg)   CC: discuss mood  Subjective:    Patient ID: Evan [name], male    DOB: 1955/12/02, 58 y.o.   MRN: [id number]  HPI: TARENCE SEARCY is a 58 y.o. male presenting on 03/26/2014 for Anxiety   Good spring. Then over last 4 months noticing increased depression with anxiety. Attributes to job stress - "I don't feel able to do it".  Started after big conference July - felt very exposed. Has affected his confidence level.   Trouble sleeping - staying asleep. Low energy, trouble concentration, appetite stable, ++anhedonia, ++depression, + SI, no HI. SI present for years, but never plan and knows would not act out on those thoughts. + excessively worrying, hypervigilant, trouble sleeping (shutting brain off). + anxiety attacks described as chest tightness, palpitations, inability to concentrate. These happen daily at work.   Wonders about memory isue - has seen neurologist for this, but eval overall stable.   Has tried wellbutrin and cymbalta - helped with anxiety but affected energy level.   adderall in past did help but didn't like side effects.   May want to see Marco Collie and Corky Sox.   Also - jock itch. Tried cortisone, white vinegar, tinactin. White vinegar has helped the most. Ongoing for months.   Past Medical History  Diagnosis Date  . Hypertension   . Hyperlipidemia   . Depression     intolerant of cymbalta and wellbutrin  . Murmur     aortic sclerosis, SBE  prophlaxis not needed as per cardio  . Diverticulosis of colon   . T2DM (type 2 diabetes mellitus)     DSME 09/2013  . 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  Allergies and medications reviewed and updated. Current Outpatient Prescriptions on File Prior to Visit  Medication Sig  . aspirin 81 MG tablet Take 81 mg  by mouth daily.    Marland Kitchen glucose blood (ONE TOUCH ULTRA TEST) test strip 250.00  . lisinopril (PRINIVIL,ZESTRIL) 10 MG tablet TAKE 1 TABLET (10 MG TOTAL) BY MOUTH DAILY.  . metFORMIN (GLUCOPHAGE) 500 MG tablet TAKE 1 TABLET (500 MG TOTAL) BY MOUTH 2 (TWO) TIMES DAILY WITH A MEAL.  . Multiple Vitamins-Minerals (MULTIVITAMIN PO) Take 1 tablet by mouth daily.  Marland Kitchen omeprazole (PRILOSEC) 40 MG capsule TAKE 1 CAPSULE (40 MG TOTAL) BY MOUTH DAILY AS NEEDED.  Marland Kitchen pravastatin (PRAVACHOL) 20 MG tablet TAKE 1 TABLET (20 MG TOTAL) BY MOUTH AT BEDTIME.  . sildenafil (VIAGRA) 100 MG tablet Take 50-100 mg by mouth as needed for erectile dysfunction.  . topiramate (TOPAMAX) 50 MG tablet Take 1 tablet (50 mg total) by mouth 2 (two) times daily.   No current facility-administered medications on file prior to visit.    Review of Systems Per HPI unless specifically indicated above    Objective:    BP 140/82  Pulse 88  Temp(Src) 98.6 F (37 C) (Oral)  Wt 286 lb 4 oz (129.842 kg)  Physical Exam  Nursing note and vitals reviewed. Constitutional: He appears well-developed and well-nourished. No distress.  Skin: Skin is warm and dry. No rash noted.  Minimal maceration R groin at leg crease. No significant erythema today  Psychiatric: His mood appears anxious.       Assessment & Plan:  Problem List Items Addressed This Visit   Tinea cruris     Overall controlled today. Treat with miconazole powder daily preventatively. If recurrent outbreak, consider oral therapy. Pt agrees with plan.    Relevant Medications      miconazole nitrate (MICATIN) POWD   MDD (major depressive disorder), recurrent episode, moderate - Primary     Meets criteria for MDD with anxiety. Discussed meds - specifically sertraline and temporary benzodiazepine. Pt hesitant for pharmacotherapy due to previous experiences with cymbalta and wellbutrin. Agrees to call psychologist friends to establish for counseling, then will update me  with effect and their recommendations. +passive SI but no plan, states no concern over acting out on these thoughts. Discussed precautions with SI if we were to start antidepressant.    Diabetes type 2, controlled   Relevant Orders      Hemoglobin A1c    Other Visit Diagnoses   Prostate cancer screening        Relevant Orders       PSA        Follow up plan: Return in about 4 months (around 07/27/2014), or if symptoms worsen or fail to improve, for physical.

## 2014-03-26 NOTE — Assessment & Plan Note (Signed)
Overall controlled today. Treat with miconazole powder daily preventatively. If recurrent outbreak, consider oral therapy. Pt agrees with plan.

## 2014-03-26 NOTE — Assessment & Plan Note (Signed)
Meets criteria for MDD with anxiety. Discussed meds - specifically sertraline and temporary benzodiazepine. Pt hesitant for pharmacotherapy due to previous experiences with cymbalta and wellbutrin. Agrees to call psychologist friends to establish for counseling, then will update me with effect and their recommendations. +passive SI but no plan, states no concern over acting out on these thoughts. Discussed precautions with SI if we were to start antidepressant.

## 2014-03-26 NOTE — Progress Notes (Signed)
Pre visit review using our clinic review tool, if applicable. No additional management support is needed unless otherwise documented below in the visit note. 

## 2014-03-26 NOTE — Patient Instructions (Addendum)
Flu shot today. a1c and PSA today. For jock itch - try miconazole powder (sent to pharmacy) Call psychologist for counseling  Return in 3-4 months for physical.

## 2014-03-26 NOTE — Addendum Note (Signed)
Addended by: Royann Shivers A on: 03/26/2014 09:55 AM   Modules accepted: Orders

## 2014-03-30 ENCOUNTER — Other Ambulatory Visit: Payer: Self-pay

## 2014-05-03 ENCOUNTER — Other Ambulatory Visit: Payer: Self-pay | Admitting: Family Medicine

## 2014-05-04 ENCOUNTER — Other Ambulatory Visit: Payer: Self-pay | Admitting: Family Medicine

## 2014-06-13 ENCOUNTER — Other Ambulatory Visit: Payer: Self-pay | Admitting: Family Medicine

## 2014-06-24 ENCOUNTER — Other Ambulatory Visit: Payer: Self-pay | Admitting: Family Medicine

## 2014-07-23 ENCOUNTER — Other Ambulatory Visit: Payer: Federal, State, Local not specified - PPO

## 2014-07-25 ENCOUNTER — Other Ambulatory Visit: Payer: Self-pay | Admitting: Family Medicine

## 2014-07-25 DIAGNOSIS — E119 Type 2 diabetes mellitus without complications: Secondary | ICD-10-CM

## 2014-07-25 DIAGNOSIS — E538 Deficiency of other specified B group vitamins: Secondary | ICD-10-CM

## 2014-07-25 DIAGNOSIS — E785 Hyperlipidemia, unspecified: Secondary | ICD-10-CM

## 2014-07-25 DIAGNOSIS — I10 Essential (primary) hypertension: Secondary | ICD-10-CM

## 2014-07-26 ENCOUNTER — Other Ambulatory Visit (INDEPENDENT_AMBULATORY_CARE_PROVIDER_SITE_OTHER): Payer: Federal, State, Local not specified - PPO

## 2014-07-26 DIAGNOSIS — E785 Hyperlipidemia, unspecified: Secondary | ICD-10-CM

## 2014-07-26 DIAGNOSIS — E119 Type 2 diabetes mellitus without complications: Secondary | ICD-10-CM

## 2014-07-26 DIAGNOSIS — E538 Deficiency of other specified B group vitamins: Secondary | ICD-10-CM

## 2014-07-26 DIAGNOSIS — I10 Essential (primary) hypertension: Secondary | ICD-10-CM

## 2014-07-26 LAB — BASIC METABOLIC PANEL
BUN: 16 mg/dL (ref 6–23)
CO2: 26 meq/L (ref 19–32)
CREATININE: 1.01 mg/dL (ref 0.40–1.50)
Calcium: 9.6 mg/dL (ref 8.4–10.5)
Chloride: 105 mEq/L (ref 96–112)
GFR: 79.79 mL/min (ref >60.00)
Glucose, Bld: 145 mg/dL — ABNORMAL HIGH (ref 70–99)
Potassium: 4.1 mEq/L (ref 3.5–5.1)
Sodium: 139 mEq/L (ref 135–145)

## 2014-07-26 LAB — LIPID PANEL
Cholesterol: 182 mg/dL (ref 0–200)
HDL: 57.8 mg/dL (ref >39.00)
LDL Cholesterol: 92 mg/dL (ref 0–99)
NonHDL: 124.2
Total CHOL/HDL Ratio: 3
Triglycerides: 159 mg/dL — ABNORMAL HIGH (ref 0.0–149.0)
VLDL: 31.8 mg/dL (ref 0.0–40.0)

## 2014-07-26 LAB — HEMOGLOBIN A1C: Hgb A1c MFr Bld: 7.2 % — ABNORMAL HIGH (ref 4.6–6.5)

## 2014-07-26 LAB — VITAMIN B12: VITAMIN B 12: 374 pg/mL (ref 211–911)

## 2014-07-27 ENCOUNTER — Ambulatory Visit (INDEPENDENT_AMBULATORY_CARE_PROVIDER_SITE_OTHER): Payer: Federal, State, Local not specified - PPO | Admitting: Family Medicine

## 2014-07-27 ENCOUNTER — Encounter: Payer: Self-pay | Admitting: Family Medicine

## 2014-07-27 VITALS — BP 136/78 | HR 76 | Temp 98.1°F | Ht 71.0 in | Wt 297.0 lb

## 2014-07-27 DIAGNOSIS — Z1211 Encounter for screening for malignant neoplasm of colon: Secondary | ICD-10-CM

## 2014-07-27 DIAGNOSIS — I10 Essential (primary) hypertension: Secondary | ICD-10-CM

## 2014-07-27 DIAGNOSIS — G43109 Migraine with aura, not intractable, without status migrainosus: Secondary | ICD-10-CM

## 2014-07-27 DIAGNOSIS — Z0000 Encounter for general adult medical examination without abnormal findings: Secondary | ICD-10-CM

## 2014-07-27 DIAGNOSIS — E119 Type 2 diabetes mellitus without complications: Secondary | ICD-10-CM

## 2014-07-27 DIAGNOSIS — E669 Obesity, unspecified: Secondary | ICD-10-CM | POA: Insufficient documentation

## 2014-07-27 DIAGNOSIS — E785 Hyperlipidemia, unspecified: Secondary | ICD-10-CM

## 2014-07-27 DIAGNOSIS — F331 Major depressive disorder, recurrent, moderate: Secondary | ICD-10-CM

## 2014-07-27 DIAGNOSIS — Z6841 Body Mass Index (BMI) 40.0 and over, adult: Secondary | ICD-10-CM

## 2014-07-27 MED ORDER — METFORMIN HCL 500 MG PO TABS: ORAL_TABLET | ORAL | Status: DC

## 2014-07-27 NOTE — Progress Notes (Signed)
BP 136/78 mmHg  Pulse 76  Temp(Src) 98.1 F (36.7 C) (Oral)  Ht 5\' 11"  (1.803 m)  Wt 297 lb (134.718 kg)  BMI 41.44 kg/m2   CC: CPE  Subjective:    Patient ID: Evan [name], male    DOB: [birth year], 59 y.o.   MRN: [id number]  HPI: Evan [name] is a 59 y.o. male presenting on 07/27/2014 for Annual Exam   Mood - prior visit we discussed worsening depression/anxiety. Pt declined pharmacotherapy, recommended he see counselor.   Obesity - weight gain noted over the last year. 30 lbs in last year.   Walks regularly - total of 5 miles per day including walking at work..   Preventative: Colonoscopy - done 2006, diverticulosis, rec rpt 74yrs.  Prostate cancer screening - gets PSA done every few years. Brother with prostate cancer. Last PSA and DRE was 2011.  Flu shot 03/2014 Tetanus shot 04/2012 (Tdap).  Pneumovax 12/2012 zostavax - declines. Has had recurrent shingles in the past Seat belt and sunscreen use discussed. No changing moles on skin.   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 Keeps record of caloric intake  Relevant past medical, surgical, family and social history reviewed and updated as indicated. Interim medical history since our last visit reviewed. Allergies and medications reviewed and updated. Current Outpatient Prescriptions on File Prior to Visit  Medication Sig  . aspirin 81 MG tablet Take 81 mg by mouth daily.    Marland Kitchen lisinopril (PRINIVIL,ZESTRIL) 10 MG tablet TAKE 1 TABLET (10 MG TOTAL) BY MOUTH DAILY.  . miconazole nitrate (MICATIN) POWD Apply 1 application topically daily.  . Multiple Vitamins-Minerals (MULTIVITAMIN PO) Take 1 tablet by mouth daily.  Marland Kitchen omeprazole (PRILOSEC) 40 MG capsule TAKE ONE CAPSULE BY MOUTH EVERY DAY AS NEEDED  . ONE TOUCH ULTRA  TEST test strip USE AS DIRECTED  . pravastatin (PRAVACHOL) 20 MG tablet TAKE 1 TABLET (20 MG TOTAL) BY MOUTH AT BEDTIME.  . sildenafil (VIAGRA) 100 MG tablet Take 50-100 mg by mouth as needed for erectile dysfunction.  . topiramate (TOPAMAX) 50 MG tablet TAKE 1 TABLET (50 MG TOTAL) BY MOUTH 2 (TWO) TIMES DAILY.   No current facility-administered medications on file prior to visit.    Review of Systems  Constitutional: Positive for unexpected weight change. Negative for fever, chills, activity change, appetite change and fatigue.  HENT: Positive for postnasal drip. Negative for hearing loss.   Eyes: Negative for visual disturbance.  Respiratory: Negative for cough, chest tightness, shortness of breath and wheezing.   Cardiovascular: Positive for leg swelling. Negative for chest pain and palpitations.  Gastrointestinal: Negative for nausea, vomiting, abdominal pain, diarrhea, constipation, blood in stool and abdominal distention.  Genitourinary: Negative for hematuria and difficulty urinating.  Musculoskeletal: Negative for myalgias, arthralgias and neck pain.  Skin: Negative for rash.  Neurological: Negative for dizziness, seizures, syncope and headaches.  Hematological: Negative for adenopathy. Does not bruise/bleed easily.  Psychiatric/Behavioral: Negative for dysphoric mood (stable recently). The patient is not nervous/anxious.    Per HPI unless specifically indicated above     Objective:    BP 136/78 mmHg  Pulse 76  Temp(Src) 98.1 F (36.7 C) (Oral)  Ht 5\' 11"  (1.803 m)  Wt 297 lb (134.718 kg)  BMI 41.44 kg/m2  Wt Readings from Last 3 Encounters:  07/27/14 297 lb (134.718 kg)  03/26/14 286 lb 4 oz (129.842 kg)  10/10/13 269 lb (122.018 kg)    Physical Exam  Constitutional: He is oriented to person, place, and time. He appears well-developed and well-nourished. No distress.  Morbidly obese  HENT:  Head: Normocephalic and atraumatic.  Right Ear: Hearing, tympanic  membrane, external ear and ear canal normal.  Left Ear: Hearing, tympanic membrane, external ear and ear canal normal.  Nose: Nose normal.  Mouth/Throat: Uvula is midline, oropharynx is clear and moist and mucous membranes are normal. No oropharyngeal exudate, posterior oropharyngeal edema or posterior oropharyngeal erythema.  Eyes: Conjunctivae and EOM are normal. Pupils are equal, round, and reactive to light. No scleral icterus.  Neck: Normal range of motion. Neck supple. No thyromegaly present.  Cardiovascular: Normal rate, regular rhythm, normal heart sounds and intact distal pulses.   No murmur heard. Pulses:      Radial pulses are 2+ on the right side, and 2+ on the left side.  Pulmonary/Chest: Effort normal and breath sounds normal. No respiratory distress. He has no wheezes. He has no rales.  Abdominal: Soft. Bowel sounds are normal. He exhibits no distension and no mass. There is no tenderness. There is no rebound and no guarding.  Genitourinary:  Deferred - normal PSA 03/2014  Musculoskeletal: Normal range of motion. He exhibits no edema.  Mild crepitus L knee. No popliteal fullness appreciated  Lymphadenopathy:    He has no cervical adenopathy.  Neurological: He is alert and oriented to person, place, and time.  CN grossly intact, station and gait intact  Skin: Skin is warm and dry. No rash noted.  Psychiatric: He has a normal mood and affect. His behavior is normal. Judgment and thought content normal.  Nursing note and vitals reviewed.  Results for orders placed or performed in visit on 07/26/14  Vitamin B12  Result Value Ref Range   Vitamin B-12 374 211 - 911 pg/mL  Lipid panel  Result Value Ref Range   Cholesterol 182 0 - 200 mg/dL   Triglycerides 159.0 (H) 0.0 - 149.0 mg/dL   HDL 57.80 >39.00 mg/dL   VLDL 31.8 0.0 - 40.0 mg/dL   LDL Cholesterol 92 0 - 99 mg/dL   Total CHOL/HDL Ratio 3    NonHDL 124.20   Hemoglobin A1c  Result Value Ref Range   Hgb A1c MFr Bld  7.2 (H) 4.6 - 6.5 %  Basic metabolic panel  Result Value Ref Range   Sodium 139 135 - 145 mEq/L   Potassium 4.1 3.5 - 5.1 mEq/L   Chloride 105 96 - 112 mEq/L   CO2 26 19 - 32 mEq/L   Glucose, Bld 145 (H) 70 - 99 mg/dL   BUN 16 6 - 23 mg/dL   Creatinine, Ser 1.01 0.40 - 1.50 mg/dL   Calcium 9.6 8.4 - 10.5 mg/dL   GFR 79.79 >60.00 mL/min      Assessment & Plan:   Problem List Items Addressed This Visit    Ophthalmic migraine    Stable on topamax - continue      Relevant Medications   ibuprofen (ADVIL,MOTRIN) 200 MG tablet   Morbid obesity    Body mass index is 41.44 kg/(m^2).  Will recommend starting to count calories as he previoulsy was doing and encouraged increased walking to affect sustainable weight loss.      Relevant Medications   metFORMIN (GLUCOPHAGE) tablet   MDD (major depressive disorder), recurrent episode, moderate    Overall  improved. Saw counselor.  Denies any persistent trouble currently.      HLD (hyperlipidemia)    Reviewed #s, discussed healthy diet and lifestyle change to maintain good control of lipid levels.      Healthcare maintenance - Primary    Preventative protocols reviewed and updated unless pt declined. Discussed healthy diet and lifestyle.       Essential hypertension    Chronic, stable. Continue lisinopril 10mg  daily.      Diabetes type 2, controlled    Slightly uncontrolled - increase metformin to 500mg  tid. Reassess in 3 mo. Anticipate deterioration related to weight gain noted.      Relevant Medications   metFORMIN (GLUCOPHAGE) tablet    Other Visit Diagnoses    Special screening for malignant neoplasms, colon        Relevant Orders    Fecal occult blood, imunochemical        Follow up plan: Return in about 3 months (around 10/25/2014), or as needed, for follow up visit.

## 2014-07-27 NOTE — Assessment & Plan Note (Signed)
Stable on topamax - continue

## 2014-07-27 NOTE — Assessment & Plan Note (Signed)
Preventative protocols reviewed and updated unless pt declined. Discussed healthy diet and lifestyle.  

## 2014-07-27 NOTE — Patient Instructions (Addendum)
Pass by lab to pick up stool kit. Increase metformin to 510m three times daily with meals Start keeping food calorie diary. Return in 3 months for follow up visit.

## 2014-07-27 NOTE — Assessment & Plan Note (Signed)
Overall improved. Saw counselor.  Denies any persistent trouble currently.

## 2014-07-27 NOTE — Assessment & Plan Note (Signed)
Body mass index is 41.44 kg/(m^2).  Will recommend starting to count calories as he previoulsy was doing and encouraged increased walking to affect sustainable weight loss.

## 2014-07-27 NOTE — Addendum Note (Signed)
Addended by: Ria Bush on: 07/27/2014 02:40 PM   Modules accepted: Level of Service, SmartSet

## 2014-07-27 NOTE — Assessment & Plan Note (Signed)
Chronic, stable. Continue lisinopril 10mg daily. 

## 2014-07-27 NOTE — Assessment & Plan Note (Signed)
Slightly uncontrolled - increase metformin to 500mg  tid. Reassess in 3 mo. Anticipate deterioration related to weight gain noted.

## 2014-07-27 NOTE — Progress Notes (Signed)
Pre visit review using our clinic review tool, if applicable. No additional management support is needed unless otherwise documented below in the visit note. 

## 2014-07-27 NOTE — Assessment & Plan Note (Signed)
Reviewed #s, discussed healthy diet and lifestyle change to maintain good control of lipid levels.

## 2014-08-04 ENCOUNTER — Other Ambulatory Visit: Payer: Self-pay | Admitting: Family Medicine

## 2014-08-04 MED ORDER — OMEPRAZOLE 40 MG PO CPDR: 40.0000 mg | DELAYED_RELEASE_CAPSULE | Freq: Every day | ORAL | Status: DC | PRN

## 2014-08-08 ENCOUNTER — Other Ambulatory Visit: Payer: Federal, State, Local not specified - PPO

## 2014-08-08 DIAGNOSIS — Z1211 Encounter for screening for malignant neoplasm of colon: Secondary | ICD-10-CM

## 2014-08-08 LAB — FECAL OCCULT BLOOD, GUAIAC: Fecal Occult Blood: NEGATIVE

## 2014-08-08 LAB — FECAL OCCULT BLOOD, IMMUNOCHEMICAL: FECAL OCCULT BLD: NEGATIVE

## 2014-08-09 ENCOUNTER — Encounter: Payer: Self-pay | Admitting: *Deleted

## 2014-08-30 ENCOUNTER — Other Ambulatory Visit: Payer: Self-pay | Admitting: Family Medicine

## 2014-09-28 ENCOUNTER — Encounter: Payer: Self-pay | Admitting: Family Medicine

## 2014-10-01 MED ORDER — SERTRALINE HCL 25 MG PO TABS: 25.0000 mg | ORAL_TABLET | Freq: Every day | ORAL | Status: DC

## 2014-10-29 ENCOUNTER — Ambulatory Visit (INDEPENDENT_AMBULATORY_CARE_PROVIDER_SITE_OTHER): Payer: Federal, State, Local not specified - PPO | Admitting: Family Medicine

## 2014-10-29 ENCOUNTER — Encounter: Payer: Self-pay | Admitting: Family Medicine

## 2014-10-29 VITALS — BP 128/82 | HR 84 | Temp 98.5°F | Wt 279.5 lb

## 2014-10-29 DIAGNOSIS — F331 Major depressive disorder, recurrent, moderate: Secondary | ICD-10-CM | POA: Diagnosis not present

## 2014-10-29 DIAGNOSIS — E119 Type 2 diabetes mellitus without complications: Secondary | ICD-10-CM

## 2014-10-29 DIAGNOSIS — E785 Hyperlipidemia, unspecified: Secondary | ICD-10-CM

## 2014-10-29 DIAGNOSIS — I10 Essential (primary) hypertension: Secondary | ICD-10-CM

## 2014-10-29 LAB — MICROALBUMIN / CREATININE URINE RATIO
CREATININE, U: 131.7 mg/dL
Microalb Creat Ratio: 0.5 mg/g (ref 0.0–30.0)
Microalb, Ur: <0.7 mg/dL (ref 0.0–1.9)

## 2014-10-29 LAB — HEMOGLOBIN A1C: HEMOGLOBIN A1C: 6.2 % (ref 4.6–6.5)

## 2014-10-29 NOTE — Assessment & Plan Note (Signed)
Chronic, stable. Continue lisinopril.  

## 2014-10-29 NOTE — Progress Notes (Addendum)
BP 128/82 mmHg  Pulse 84  Temp(Src) 98.5 F (36.9 C) (Oral)  Wt 279 lb 8 oz (126.78 kg)   CC: 3 mo f/u visit DM  Subjective:    Patient ID: Evan [name], male    DOB: Jan 28, 1956, 59 y.o.   MRN: [id number]  HPI: Evan [name] is a 59 y.o. male presenting on 10/29/2014 for Follow-up   Obesity - congratulated on 18 lb weight loss. Continues walking regularly at work. Has decreased carbs significantly (no carb diet).   DM - regularly does check sugars fasting 101, after meals 120s. Compliant with antihyperglycemic regimen which includes: metformin 500mg  tid. Denies low sugars or hypoglycemic symptoms.  Denies paresthesias. Last diabetic eye exam DUE.  Pneumovax: 12/2012.  Prevnar: not due yet. Lab Results  Component Value Date   HGBA1C 7.2* 07/26/2014   Diabetic Foot Exam - Simple   No data filed       HLD - complaint with pravastatin without myalgias.  HTN - compliant with lisionpril 10mg  daily. No HA, vision changes, CP/tightness, SOB, leg swelling.  Depression - has been on sertraline 25mg  daily for the past month - without significant effect noted. Noticing side effects of fatigue and sexual dysfunction. Prior tried wellbutrin and cymbalta, did not tolerate either well.  Relevant past medical, surgical, family and social history reviewed and updated as indicated. Interim medical history since our last visit reviewed. Allergies and medications reviewed and updated. Current Outpatient Prescriptions on File Prior to Visit  Medication Sig  . aspirin 81 MG tablet Take 81 mg by mouth daily.    Marland Kitchen ibuprofen (ADVIL,MOTRIN) 200 MG tablet Take 200 mg by mouth at bedtime as needed.  Marland Kitchen lisinopril (PRINIVIL,ZESTRIL) 10 MG tablet TAKE 1 TABLET (10 MG TOTAL) BY MOUTH DAILY.  . miconazole nitrate (MICATIN) POWD Apply 1 application topically daily.  . Multiple Vitamins-Minerals (MULTIVITAMIN PO) Take 1 tablet by mouth daily.  Marland Kitchen omeprazole (PRILOSEC) 40 MG capsule Take 1 capsule (40 mg  total) by mouth daily as needed.  . ONE TOUCH ULTRA TEST test strip USE AS DIRECTED  . pravastatin (PRAVACHOL) 20 MG tablet TAKE 1 TABLET (20 MG TOTAL) BY MOUTH AT BEDTIME.  Marland Kitchen sertraline (ZOLOFT) 25 MG tablet Take 1 tablet (25 mg total) by mouth daily.  . sildenafil (VIAGRA) 100 MG tablet Take 50-100 mg by mouth as needed for erectile dysfunction.  . topiramate (TOPAMAX) 50 MG tablet TAKE 1 TABLET (50 MG TOTAL) BY MOUTH 2 (TWO) TIMES DAILY.   No current facility-administered medications on file prior to visit.    Review of Systems Per HPI unless specifically indicated above     Objective:    BP 128/82 mmHg  Pulse 84  Temp(Src) 98.5 F (36.9 C) (Oral)  Wt 279 lb 8 oz (126.78 kg)  Wt Readings from Last 3 Encounters:  10/29/14 279 lb 8 oz (126.78 kg)  07/27/14 297 lb (134.718 kg)  03/26/14 286 lb 4 oz (129.842 kg)    Physical Exam  Constitutional: He appears well-developed and well-nourished. No distress.  HENT:  Head: Normocephalic and atraumatic.  Right Ear: External ear normal.  Left Ear: External ear normal.  Nose: Nose normal.  Mouth/Throat: Oropharynx is clear and moist. No oropharyngeal exudate.  Eyes: Conjunctivae and EOM are normal. Pupils are equal, round, and reactive to light. No scleral icterus.  Neck: Normal range of motion. Neck supple.  Cardiovascular: Normal rate, regular rhythm and intact distal pulses.   Murmur (3/6 SEM best at RUSB)  heard. Pulmonary/Chest: Effort normal and breath sounds normal. No respiratory distress. He has no wheezes. He has no rales.  Musculoskeletal: He exhibits no edema.  See HPI for foot exam if done  Lymphadenopathy:    He has no cervical adenopathy.  Skin: Skin is warm and dry. No rash noted.  Psychiatric: He has a normal mood and affect.  Nursing note and vitals reviewed.  Results for orders placed or performed in visit on 08/09/14  Fecal Occult Blood, [place]ai[place]  Result Value Ref Range   Fecal Occult Blood Negative         Assessment & Plan:   Problem List Items Addressed This Visit    Diabetes type 2, controlled - Primary    Anticipate marked improvement with diet changes and weight loss. Pt tired of monotony of current low carb diet. Suggested ADA website for further resources, encouraged he buy diabetic recipe book at local bookstore.      Relevant Medications   metFORMIN (GLUCOPHAGE) 500 MG tablet   Other Relevant Orders   Hemoglobin A1c   Microalbumin / creatinine urine ratio   Essential hypertension    Chronic, stable. Continue lisinopril.      HLD (hyperlipidemia)    Chronic, stable. Continue pravastatin.      MDD (major depressive disorder), recurrent episode, moderate    Prior tried wellbutrin and cymbalta, did not tolerate either well. Sertraline not effective so far. Discussed options of augmentation or increased dose. Pt requests to continue current dose for another month, then will call me in 1 month with update and decide on switch in med if desired.      Morbid obesity    Body mass index is 39 kg/(m^2).  Congratulated on weight loss to date with healthy diet chnages.      Relevant Medications   metFORMIN (GLUCOPHAGE) 500 MG tablet       Follow up plan: Return in about 6 months (around 05/01/2015), or as needed, for follow up visit.

## 2014-10-29 NOTE — Assessment & Plan Note (Addendum)
Chronic, stable. Continue pravastatin. 

## 2014-10-29 NOTE — Assessment & Plan Note (Signed)
Prior tried wellbutrin and cymbalta, did not tolerate either well. Sertraline not effective so far. Discussed options of augmentation or increased dose. Pt requests to continue current dose for another month, then will call me in 1 month with update and decide on switch in med if desired.

## 2014-10-29 NOTE — Progress Notes (Signed)
Pre visit review using our clinic review tool, if applicable. No additional management support is needed unless otherwise documented below in the visit note. 

## 2014-10-29 NOTE — Assessment & Plan Note (Signed)
Anticipate marked improvement with diet changes and weight loss. Pt tired of monotony of current low carb diet. Suggested ADA website for further resources, encouraged he buy diabetic recipe book at local bookstore.

## 2014-10-29 NOTE — Patient Instructions (Addendum)
Schedule eye exam at your convenience Good to see you today, call us with questions. Congratulations on weight loss! Look at www.diabetes.org or local book store for diabetic recipes. Return in 6 months for next follow up visit.

## 2014-10-29 NOTE — Assessment & Plan Note (Signed)
Body mass index is 39 kg/(m^2).  Congratulated on weight loss to date with healthy diet chnages.

## 2014-11-05 ENCOUNTER — Other Ambulatory Visit: Payer: Self-pay | Admitting: Family Medicine

## 2014-12-10 ENCOUNTER — Other Ambulatory Visit: Payer: Self-pay

## 2014-12-21 ENCOUNTER — Other Ambulatory Visit: Payer: Self-pay | Admitting: Family Medicine

## 2014-12-24 ENCOUNTER — Other Ambulatory Visit: Payer: Self-pay | Admitting: Family Medicine

## 2015-01-14 DIAGNOSIS — K760 Fatty (change of) liver, not elsewhere classified: Secondary | ICD-10-CM | POA: Diagnosis present

## 2015-01-14 HISTORY — DX: Fatty (change of) liver, not elsewhere classified: K76.0

## 2015-01-23 ENCOUNTER — Other Ambulatory Visit: Payer: Self-pay | Admitting: Family Medicine

## 2015-02-08 ENCOUNTER — Telehealth: Payer: Self-pay | Admitting: Family Medicine

## 2015-02-08 NOTE — Telephone Encounter (Signed)
Pt sent my chart wanting appointment called pt Pt didn't want to come in today appointment for Monday 8/29  See below my chart message for appointment Is it ok to wait     Reason: To address the following health maintenance concerns.   Hepatitis C Screening      Comments:   Also having distention in upper abdomen, with some discomfort, slight pain and pressure, just under right rib cage.

## 2015-02-08 NOTE — Telephone Encounter (Signed)
Message left advising patient not to wait until Monday if he had abd pain and distention. Advised to go to Saturday clinic (gave info) or go to [facility]. (I was told that this call had been forwarded to another CMA in the office since I wasn't working with Dr. Darnell Level. Apparently it wasn't, so I just got to it.)

## 2015-02-08 NOTE — Telephone Encounter (Signed)
Agee with sooner eval if distension/abd pain. Call Monday - if not seen over weekend, schedule first available here.

## 2015-02-11 ENCOUNTER — Encounter: Payer: Self-pay | Admitting: Family Medicine

## 2015-02-11 ENCOUNTER — Ambulatory Visit (INDEPENDENT_AMBULATORY_CARE_PROVIDER_SITE_OTHER): Payer: Federal, State, Local not specified - PPO | Admitting: Family Medicine

## 2015-02-11 VITALS — BP 138/80 | HR 72 | Temp 98.6°F | Wt 264.8 lb

## 2015-02-11 DIAGNOSIS — K219 Gastro-esophageal reflux disease without esophagitis: Secondary | ICD-10-CM

## 2015-02-11 DIAGNOSIS — R1310 Dysphagia, unspecified: Secondary | ICD-10-CM

## 2015-02-11 DIAGNOSIS — R1013 Epigastric pain: Secondary | ICD-10-CM | POA: Diagnosis not present

## 2015-02-11 DIAGNOSIS — E119 Type 2 diabetes mellitus without complications: Secondary | ICD-10-CM

## 2015-02-11 DIAGNOSIS — IMO0001 Reserved for inherently not codable concepts without codable children: Secondary | ICD-10-CM

## 2015-02-11 LAB — CBC WITH DIFFERENTIAL/PLATELET
Basophils Absolute: 0.1 10*3/uL (ref 0.0–0.1)
Basophils Relative: 0.9 % (ref 0.0–3.0)
EOS PCT: 1.8 % (ref 0.0–5.0)
Eosinophils Absolute: 0.1 10*3/uL (ref 0.0–0.7)
HCT: 44 % (ref 39.0–52.0)
Hemoglobin: 14.7 g/dL (ref 13.0–17.0)
LYMPHS ABS: 1.8 10*3/uL (ref 0.7–4.0)
Lymphocytes Relative: 26.5 % (ref 12.0–46.0)
MCHC: 33.4 g/dL (ref 30.0–36.0)
MCV: 96.3 fl (ref 78.0–100.0)
MONO ABS: 0.7 10*3/uL (ref 0.1–1.0)
MONOS PCT: 11 % (ref 3.0–12.0)
NEUTROS ABS: 4 10*3/uL (ref 1.4–7.7)
NEUTROS PCT: 59.8 % (ref 43.0–77.0)
PLATELETS: 293 10*3/uL (ref 150.0–400.0)
RBC: 4.57 Mil/uL (ref 4.22–5.81)
RDW: 14.7 % (ref 11.5–15.5)
WBC: 6.6 10*3/uL (ref 4.0–10.5)

## 2015-02-11 LAB — LIPASE: LIPASE: 15 U/L (ref 11.0–59.0)

## 2015-02-11 LAB — COMPREHENSIVE METABOLIC PANEL
ALT: 27 U/L (ref 0–53)
AST: 23 U/L (ref 0–37)
Albumin: 4.2 g/dL (ref 3.5–5.2)
Alkaline Phosphatase: 45 U/L (ref 39–117)
BUN: 10 mg/dL (ref 6–23)
CO2: 26 meq/L (ref 19–32)
Calcium: 9.4 mg/dL (ref 8.4–10.5)
Chloride: 104 mEq/L (ref 96–112)
Creatinine, Ser: 1 mg/dL (ref 0.40–1.50)
GFR: 80.56 mL/min (ref >60.00)
GLUCOSE: 105 mg/dL — AB (ref 70–99)
POTASSIUM: 4.4 meq/L (ref 3.5–5.1)
Sodium: 139 mEq/L (ref 135–145)
Total Bilirubin: 0.5 mg/dL (ref 0.2–1.2)
Total Protein: 7.3 g/dL (ref 6.0–8.3)

## 2015-02-11 LAB — POCT URINALYSIS DIPSTICK
Bilirubin, UA: NEGATIVE
Blood, UA: NEGATIVE
Glucose, UA: NEGATIVE
Ketones, UA: NEGATIVE
LEUKOCYTES UA: NEGATIVE
Nitrite, UA: NEGATIVE
PH UA: 6
PROTEIN UA: NEGATIVE
SPEC GRAV UA: 1.025
Urobilinogen, UA: 0.2

## 2015-02-11 NOTE — Progress Notes (Signed)
BP 138/80 mmHg  Pulse 72  Temp(Src) 98.6 F (37 C) (Oral)  Wt 264 lb 12 oz (120.09 kg)   CC: abd discomfort  Subjective:    Patient ID: Evan [name], male    DOB: 14-Apr-1956, 59 y.o.   MRN: [id number]  HPI: Evan [name] is a 59 y.o. male presenting on 02/11/2015 for Abdominal Pain   6-8 wk bloating/swelling upper abdomen as well as pressure R upper ribcage, associated with some pain back. No relation to food. Leaning to right side causes worsening pressure RUQ. No fevers/chills, nausea/vomiting, dysphagia, diarrhea/constipation. + early satiety over last 2-3 months. Intermittent dysphagia - more so to solid foods like almonds, apples (coughing fit after eating these foods). No trouble to rice, bread, meats. H/o GERD controlled on omeprazole 40mg  prn.  He did decrease metformin over weekend (less need given weight loss) and sxs did improve.  Lab Results  Component Value Date   HGBA1C 6.2 10/29/2014    Takes ibuprofen 200mg  PRN (4-5 pills a week).  Takes omeprazole 40mg  daily PRN (takes about 5x/wk). This helps control GERD sxs.   No h/o kidney stones. No h/o gallstones.  No h/o abd surgeries.  COLONOSCOPY Date: 2006 diverticulosis per pt iFOB normal 07/2014.  Obesity - working on weight loss through increased activity and decreased carbs - 15 lb in 3 mo. Starting weight 315lbs 2 yrs ago. Notes marked improvement in sugar control.  Body mass index is 36.94 kg/(m^2).   Family History  Problem Relation Age of Onset  . Alzheimer's disease Father   . Hypertension Father   . Stroke Father   . Hyperlipidemia Father   . Coronary artery disease Brother 50    diabetic, MI  . Prostate cancer Brother   . Diabetes Brother   . Coronary artery disease Maternal Uncle   . Schizophrenia Paternal Uncle   . Emphysema Mother    Relevant past medical, surgical, family and social history reviewed and updated as indicated. Interim medical history since our last visit reviewed. Allergies  and medications reviewed and updated. Current Outpatient Prescriptions on File Prior to Visit  Medication Sig  . aspirin 81 MG tablet Take 81 mg by mouth daily.    Marland Kitchen ibuprofen (ADVIL,MOTRIN) 200 MG tablet Take 200 mg by mouth at bedtime as needed.  Marland Kitchen lisinopril (PRINIVIL,ZESTRIL) 10 MG tablet TAKE 1 TABLET (10 MG TOTAL) BY MOUTH DAILY.  . miconazole nitrate (MICATIN) POWD Apply 1 application topically daily.  . Multiple Vitamins-Minerals (MULTIVITAMIN PO) Take 1 tablet by mouth daily.  Marland Kitchen omeprazole (PRILOSEC) 40 MG capsule Take 1 capsule (40 mg total) by mouth daily as needed.  Marland Kitchen omeprazole (PRILOSEC) 40 MG capsule TAKE ONE CAPSULE BY MOUTH EVERY DAY AS NEEDED  . ONE TOUCH ULTRA TEST test strip USE AS DIRECTED  . pravastatin (PRAVACHOL) 20 MG tablet TAKE 1 TABLET (20 MG TOTAL) BY MOUTH AT BEDTIME.  Marland Kitchen sertraline (ZOLOFT) 25 MG tablet TAKE 1 TABLET (25 MG TOTAL) BY MOUTH DAILY.  . sildenafil (VIAGRA) 100 MG tablet Take 50-100 mg by mouth as needed for erectile dysfunction.  . topiramate (TOPAMAX) 50 MG tablet TAKE 1 TABLET BY MOUTH 2 TIMES DAILY   No current facility-administered medications on file prior to visit.    Review of Systems Per HPI unless specifically indicated above     Objective:    BP 138/80 mmHg  Pulse 72  Temp(Src) 98.6 F (37 C) (Oral)  Wt 264 lb 12 oz (120.09 kg)  Wt  Readings from Last 3 Encounters:  02/11/15 264 lb 12 oz (120.09 kg)  10/29/14 279 lb 8 oz (126.78 kg)  07/27/14 297 lb (134.718 kg)   Body mass index is 36.94 kg/(m^2).  Physical Exam  Constitutional: He appears well-developed and well-nourished. No distress.  HENT:  Mouth/Throat: Oropharynx is clear and moist. No oropharyngeal exudate.  Cardiovascular: Normal rate, regular rhythm, normal heart sounds and intact distal pulses.   No murmur heard. Pulmonary/Chest: Effort normal and breath sounds normal. No respiratory distress. He has no wheezes. He has no rales.  Abdominal: Soft. Normal  appearance and bowel sounds are normal. He exhibits no distension and no mass. There is no hepatosplenomegaly. There is tenderness (mild) in the epigastric area. There is no rigidity, no rebound, no guarding, no CVA tenderness and negative Murphy's sign.  Musculoskeletal: He exhibits no edema.  Skin: Skin is warm and dry. No rash noted.  Psychiatric: He has a normal mood and affect.  Nursing note and vitals reviewed.  Results for orders placed or performed in visit on 02/11/15  POCT Urinalysis Dipstick  Result Value Ref Range   Color, UA Yellow    Clarity, UA Clear    Glucose, UA Negative    Bilirubin, UA Negative    Ketones, UA Negative    Spec Grav, UA 1.025    Blood, UA Negative    pH, UA 6.0    Protein, UA Negative    Urobilinogen, UA 0.2    Nitrite, UA Negative    Leukocytes, UA Negative Negative      Assessment & Plan:   Problem List Items Addressed This Visit    Diabetes type 2, controlled    A1c great control last check - advised ok to trial off metformin and restart QD if sugars creeping up over next few weeks. Metformin may also be causing some GI distress after weight loss.      GERD (gastroesophageal reflux disease)   Relevant Orders   US Abdomen Complete   Obesity, Class II, BMI 35-39.9, with comorbidity    Congratulated on weight loss noted over last 3 months - pt has changed diet, increased activity.       Epigastric pain - Primary    Epigastric abdominal discomfort associated with RUQ pressure - check CMP, CBC, lipase and abd [place] today to eval for gallstones, pancreatitis, other. For endorsed dysphagia and early satiety, will refer to GI for consideration of EGD. Discussed barium swallow, pt opts for GI referral. Pt on PRN omeprazole 40mg  with good control of GERD sxs. Pt agrees with plan.      Relevant Orders   POCT Urinalysis Dipstick (Completed)   Comprehensive metabolic panel   CBC with Differential/Platelet   Lipase   US Abdomen Complete    Ambulatory referral to Gastroenterology    Other Visit Diagnoses    Dysphagia        Relevant Orders    Ambulatory referral to Gastroenterology        Follow up plan: Return in about 3 months (around 05/14/2015), or if symptoms worsen or fail to improve, for follow up visit.

## 2015-02-11 NOTE — Assessment & Plan Note (Signed)
A1c great control last check - advised ok to trial off metformin and restart QD if sugars creeping up over next few weeks. Metformin may also be causing some GI distress after weight loss.

## 2015-02-11 NOTE — Assessment & Plan Note (Signed)
Congratulated on weight loss noted over last 3 months - pt has changed diet, increased activity.

## 2015-02-11 NOTE — Patient Instructions (Addendum)
Ok to stop metformin - monitor sugars over next several months. If creeping up, restart once daily. Blood work today. Pass by [facility]'s office for referral to GI. Good to see you today, call us with questions.

## 2015-02-11 NOTE — Assessment & Plan Note (Signed)
Epigastric abdominal discomfort associated with RUQ pressure - check CMP, CBC, lipase and abd [place] today to eval for gallstones, pancreatitis, other. For endorsed dysphagia and early satiety, will refer to GI for consideration of EGD. Discussed barium swallow, pt opts for GI referral. Pt on PRN omeprazole 40mg  with good control of GERD sxs. Pt agrees with plan.

## 2015-02-11 NOTE — Telephone Encounter (Signed)
According to chart, patient was not evaluated over the weekend. Has appt at 9AM today.

## 2015-02-11 NOTE — Progress Notes (Signed)
Pre visit review using our clinic review tool, if applicable. No additional management support is needed unless otherwise documented below in the visit note. 

## 2015-02-14 ENCOUNTER — Ambulatory Visit
Admission: RE | Admit: 2015-02-14 | Discharge: 2015-02-14 | Disposition: A | Discharge status: Home / Self Care | Payer: Federal, State, Local not specified - PPO | Source: Ambulatory Visit | Attending: Family Medicine | Admitting: Family Medicine

## 2015-02-14 ENCOUNTER — Encounter: Payer: Self-pay | Admitting: Family Medicine

## 2015-02-14 DIAGNOSIS — K219 Gastro-esophageal reflux disease without esophagitis: Secondary | ICD-10-CM

## 2015-02-14 DIAGNOSIS — R1013 Epigastric pain: Secondary | ICD-10-CM

## 2015-03-16 DIAGNOSIS — K209 Esophagitis, unspecified without bleeding: Secondary | ICD-10-CM

## 2015-03-16 DIAGNOSIS — K2970 Gastritis, unspecified, without bleeding: Secondary | ICD-10-CM

## 2015-03-16 HISTORY — DX: Gastritis, unspecified, without bleeding: K29.70

## 2015-03-16 HISTORY — PX: ESOPHAGOGASTRODUODENOSCOPY: SHX1529

## 2015-03-16 HISTORY — PX: COLONOSCOPY: SHX174

## 2015-03-16 HISTORY — DX: Esophagitis, unspecified without bleeding: K20.90

## 2015-04-12 LAB — HM COLONOSCOPY

## 2015-04-27 ENCOUNTER — Encounter: Payer: Self-pay | Admitting: Family Medicine

## 2015-04-27 ENCOUNTER — Other Ambulatory Visit: Payer: Self-pay | Admitting: Family Medicine

## 2015-04-29 ENCOUNTER — Other Ambulatory Visit: Payer: Self-pay | Admitting: Family Medicine

## 2015-05-14 ENCOUNTER — Ambulatory Visit: Payer: Federal, State, Local not specified - PPO | Admitting: Family Medicine

## 2015-05-16 DIAGNOSIS — L280 Lichen simplex chronicus: Secondary | ICD-10-CM

## 2015-05-16 HISTORY — DX: Lichen simplex chronicus: L28.0

## 2015-05-20 ENCOUNTER — Encounter: Payer: Self-pay | Admitting: Family Medicine

## 2015-05-20 ENCOUNTER — Ambulatory Visit (INDEPENDENT_AMBULATORY_CARE_PROVIDER_SITE_OTHER): Payer: Federal, State, Local not specified - PPO | Admitting: Family Medicine

## 2015-05-20 VITALS — BP 132/82 | HR 84 | Temp 98.4°F | Wt 267.8 lb

## 2015-05-20 DIAGNOSIS — E119 Type 2 diabetes mellitus without complications: Secondary | ICD-10-CM | POA: Diagnosis not present

## 2015-05-20 DIAGNOSIS — Z23 Encounter for immunization: Secondary | ICD-10-CM | POA: Diagnosis not present

## 2015-05-20 DIAGNOSIS — I350 Nonrheumatic aortic (valve) stenosis: Secondary | ICD-10-CM | POA: Insufficient documentation

## 2015-05-20 DIAGNOSIS — E785 Hyperlipidemia, unspecified: Secondary | ICD-10-CM | POA: Diagnosis not present

## 2015-05-20 DIAGNOSIS — Q231 Congenital insufficiency of aortic valve: Secondary | ICD-10-CM | POA: Insufficient documentation

## 2015-05-20 DIAGNOSIS — I10 Essential (primary) hypertension: Secondary | ICD-10-CM

## 2015-05-20 DIAGNOSIS — R21 Rash and other nonspecific skin eruption: Secondary | ICD-10-CM

## 2015-05-20 DIAGNOSIS — Q230 Congenital stenosis of aortic valve: Secondary | ICD-10-CM | POA: Insufficient documentation

## 2015-05-20 DIAGNOSIS — R011 Cardiac murmur, unspecified: Secondary | ICD-10-CM

## 2015-05-20 DIAGNOSIS — IMO0001 Reserved for inherently not codable concepts without codable children: Secondary | ICD-10-CM

## 2015-05-20 DIAGNOSIS — K219 Gastro-esophageal reflux disease without esophagitis: Secondary | ICD-10-CM

## 2015-05-20 LAB — HEMOGLOBIN A1C: HEMOGLOBIN A1C: 6.1 % (ref 4.6–6.5)

## 2015-05-20 LAB — LDL CHOLESTEROL, DIRECT: LDL DIRECT: 99 mg/dL

## 2015-05-20 NOTE — Progress Notes (Signed)
Pre visit review using our clinic review tool, if applicable. No additional management support is needed unless otherwise documented below in the visit note. 

## 2015-05-20 NOTE — Patient Instructions (Addendum)
Flu shot today labwork today Pass by [facility]'s office for referral to skin doctor. Return in 4-6 months for physical. Nice to see you today, call us with questions.

## 2015-05-20 NOTE — Assessment & Plan Note (Signed)
Discussed weight gain noted. Pt will continue to work towards following diabetic diet, motivated to restart exercise regimen (recent travel, back injury limited exercising).

## 2015-05-20 NOTE — Assessment & Plan Note (Signed)
Continue omeprazole 40mg  daily. Has f/u with GI this afternoon.

## 2015-05-20 NOTE — Assessment & Plan Note (Signed)
Check dLDL today. Continue pravastatin.

## 2015-05-20 NOTE — Progress Notes (Addendum)
BP 132/82 mmHg  Pulse 84  Temp(Src) 98.4 F (36.9 C) (Oral)  Wt 267 lb 12 oz (121.451 kg)   CC: f/u visit  Subjective:    Patient ID: Evan [name], male    DOB: [birth year], 59 y.o.   MRN: [id number]  HPI: Evan [name] is a 59 y.o. male presenting on 05/20/2015 for Follow-up   See prior note for details. 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 Referred last visit to GI for epigastric pain s/p EGD with mild chronic gastritis/esophagitis. Colonoscopy showed 2 polyps, rpt 5 yrs [name]). On omeprazole 40mg  daily. Has f/u appt with Dr [name] this afternoon.  Persistent groin rash. Intermittently itchy. Continues miconazole powder, and several OTC remedies. Has changed shampoo, soaps.   Weight gain - hurt back, traveling a lot. Now back to walking regularly.   DM - regularly does check sugars - near normal (94 this morning). Compliant with antihyperglycemic regimen which includes: diet controlled (was able to come off metformin after weight loss. Denies low sugars or hypoglycemic symptoms.  Denies paresthesias. Last diabetic eye exam 09/2013 DUE.  Pneumovax: 12/2012.  Prevnar: not due. Lab Results  Component Value Date   HGBA1C 6.2 10/29/2014   Diabetic Foot Exam - Simple   Simple Foot Form  Diabetic Foot exam was performed with the following findings:  Yes 05/20/2015  9:33 AM  Visual Inspection  No deformities, no ulcerations, no other skin breakdown bilaterally:  Yes  Sensation Testing  Intact to touch and monofilament testing bilaterally:  Yes  Pulse Check  Posterior Tibialis and Dorsalis pulse intact bilaterally:  Yes  Comments       HTN - Compliant with current antihypertensive regimen of lisinopril 10mg  daily.  Does check blood pressures at home: 130/80s.  Denies HA, vision changes, CP/tightness, SOB, leg swelling.  Noticing increased dizziness recently.  HLD - continues pravastatin 20mg  daily without myalgias.  Relevant past medical, surgical, family and social history reviewed and updated  as indicated. Interim medical history since our last visit reviewed. Allergies and medications reviewed and updated. Current Outpatient Prescriptions on File Prior to Visit  Medication Sig  . aspirin 81 MG tablet Take 81 mg by mouth daily.    Marland Kitchen ibuprofen (ADVIL,MOTRIN) 200 MG tablet Take 200 mg by mouth at bedtime as needed.  Marland Kitchen lisinopril (PRINIVIL,ZESTRIL) 10 MG tablet TAKE 1 TABLET (10 MG TOTAL) BY MOUTH DAILY.  . miconazole nitrate (MICATIN) POWD Apply 1 application topically daily.  . Multiple Vitamins-Minerals (MULTIVITAMIN PO) Take 1 tablet by mouth daily.  Marland Kitchen omeprazole (PRILOSEC) 40 MG capsule TAKE ONE CAPSULE BY MOUTH EVERY DAY AS NEEDED  . ONE TOUCH ULTRA TEST test strip USE AS DIRECTED  . pravastatin (PRAVACHOL) 20 MG tablet TAKE 1 TABLET (20 MG TOTAL) BY MOUTH AT BEDTIME.  Marland Kitchen sertraline (ZOLOFT) 25 MG tablet TAKE 1 TABLET (25 MG TOTAL) BY MOUTH DAILY.  . sildenafil (VIAGRA) 100 MG tablet Take 50-100 mg by mouth as needed for erectile dysfunction.  . topiramate (TOPAMAX) 50 MG tablet TAKE 1 TABLET BY MOUTH 2 TIMES DAILY   No current facility-administered medications on file prior to visit.    Review of Systems Per HPI unless specifically indicated in ROS section     Objective:    BP 132/82 mmHg  Pulse 84  Temp(Src) 98.4 F (36.9 C) (Oral)  Wt 267 lb 12 oz (121.451 kg)  Wt Readings from Last 3 Encounters:  05/20/15 267 lb 12 oz (121.451 kg)  02/11/15 264 lb 12 oz (  120.09 kg)  10/29/14 279 lb 8 oz (126.78 kg)   Body mass index is 37.36 kg/(m^2).  Physical Exam  Constitutional: He appears well-developed and well-nourished. No distress.  HENT:  Head: Normocephalic and atraumatic.  Mouth/Throat: Oropharynx is clear and moist. No oropharyngeal exudate.  Eyes: Conjunctivae and EOM are normal. Pupils are equal, round, and reactive to light. No scleral icterus.  Neck: Normal range of motion. Neck supple.  Cardiovascular: Normal rate, regular rhythm and intact distal pulses.    Murmur (3/6 SM best at RUSB) heard. Pulmonary/Chest: Effort normal and breath sounds normal. No respiratory distress. He has no wheezes. He has no rales.  Musculoskeletal: He exhibits no edema.  See HPI for foot exam if done  Lymphadenopathy:    He has no cervical adenopathy.  Skin: Skin is warm and dry. Rash noted.  Hypopigmentation with smooth skin localized to R scrotum/groin, slight erythema  Psychiatric: He has a normal mood and affect.  Nursing note and vitals reviewed.  Results for orders placed or performed in visit on 04/27/15  HM COLONOSCOPY  Result Value Ref Range   HM Colonoscopy rpt 5 yrs       Assessment & Plan:   Problem List Items Addressed This Visit    Systolic murmur    Per patient not new. Will continue to monitor.      Scrotal rash    No improvement. Today not quit consistent with tinea cruris. Has been using miconazole powder daily, has also been using anti itch cream, did use OTC hydrocortisone for several weeks. We have not used oral antifungal. Will refer to derm for further eval. Pt agrees with plan.      Relevant Orders   Ambulatory referral to Dermatology   Obesity, Class II, BMI 35-39.9, with comorbidity ([place])    Discussed weight gain noted. Pt will continue to work towards following diabetic diet, motivated to restart exercise regimen (recent travel, back injury limited exercising).      HLD (hyperlipidemia)    Check dLDL today. Continue pravastatin.      GERD (gastroesophageal reflux disease)    Continue omeprazole 40mg  daily. Has f/u with GI this afternoon.      Essential hypertension    Chronic, stable. Continue current regimen.      Diabetes type 2, controlled ([place]) - Primary    Chronic, stable. Able to titrate self off metformin with weight loss. Check A1c today.      Relevant Orders   Hemoglobin A1c   LDL Cholesterol, Direct    Other Visit Diagnoses    Need for influenza vaccination        Relevant Orders    Flu Vaccine  QUAD 36+ mos PF IM (Fluarix & Fluzone Quad PF) (Completed)        Follow up plan: Return in about 4 months (around 09/18/2015), or as needed, for annual exam, prior fasting for blood work.

## 2015-05-20 NOTE — Assessment & Plan Note (Signed)
Per patient not new. Will continue to monitor.

## 2015-05-20 NOTE — Assessment & Plan Note (Signed)
Chronic, stable. Continue current regimen. 

## 2015-05-20 NOTE — Assessment & Plan Note (Signed)
Chronic, stable. Able to titrate self off metformin with weight loss. Check A1c today.

## 2015-05-20 NOTE — Assessment & Plan Note (Addendum)
No improvement. Today not quit consistent with tinea cruris. Has been using miconazole powder daily, has also been using anti itch cream, did use OTC hydrocortisone for several weeks. We have not used oral antifungal. Will refer to derm for further eval. Pt agrees with plan.

## 2015-06-01 ENCOUNTER — Encounter: Payer: Self-pay | Admitting: Family Medicine

## 2015-06-03 ENCOUNTER — Other Ambulatory Visit: Payer: Self-pay | Admitting: Family Medicine

## 2015-06-23 ENCOUNTER — Other Ambulatory Visit: Payer: Self-pay | Admitting: Family Medicine

## 2015-08-02 ENCOUNTER — Other Ambulatory Visit: Payer: Self-pay | Admitting: Family Medicine

## 2015-08-19 ENCOUNTER — Encounter: Payer: Self-pay | Admitting: Family Medicine

## 2015-08-20 ENCOUNTER — Encounter: Payer: Self-pay | Admitting: Family Medicine

## 2015-08-20 ENCOUNTER — Ambulatory Visit (INDEPENDENT_AMBULATORY_CARE_PROVIDER_SITE_OTHER): Payer: Federal, State, Local not specified - PPO | Admitting: Family Medicine

## 2015-08-20 VITALS — BP 148/82 | HR 89 | Temp 98.7°F | Wt 275.5 lb

## 2015-08-20 DIAGNOSIS — I10 Essential (primary) hypertension: Secondary | ICD-10-CM

## 2015-08-20 DIAGNOSIS — J019 Acute sinusitis, unspecified: Secondary | ICD-10-CM | POA: Diagnosis not present

## 2015-08-20 MED ORDER — DOXYCYCLINE HYCLATE 100 MG PO TABS: 100.0000 mg | ORAL_TABLET | Freq: Two times a day (BID) | ORAL | Status: DC

## 2015-08-20 MED ORDER — LOSARTAN POTASSIUM 50 MG PO TABS: 50.0000 mg | ORAL_TABLET | Freq: Every day | ORAL | Status: DC

## 2015-08-20 MED ORDER — GUAIFENESIN-CODEINE 100-10 MG/5ML PO SYRP: 5.0000 mL | ORAL_SOLUTION | Freq: Two times a day (BID) | ORAL | Status: DC | PRN

## 2015-08-20 NOTE — Assessment & Plan Note (Signed)
Possible ACEI - induced cough. Change lisinopril to losartan

## 2015-08-20 NOTE — Progress Notes (Signed)
BP 148/82 mmHg  Pulse 89  Temp(Src) 98.7 F (37.1 C) (Oral)  Wt 275 lb 8 oz (124.966 kg)  SpO2 98%   CC: cough  Subjective:    Patient ID: Evan [name], male    DOB: 10/15/1955, 60 y.o.   MRN: [id number]  HPI: Evan [name] is a 60 y.o. male presenting on 08/20/2015 for Acute Visit   Ongoing cough/drainage longstanding for 1 yr, worse over last 3 weeks with constant drainage in back of throat. Feels tickle in back of throat. Body aches and headache from coughing. Overall dry cough. Some wheezing present but no dyspnea. No significant sinus or chest congestion   No fever, sinus congestion, ear or tooth pain. No ST.   Has tried pseudophed, mucinex.  No sick contacts at home. No smokers at home. No h/o asthma or seasonal allergies.  Daughter in college with mono.  He is on ACEI.   Lab Results  Component Value Date   HGBA1C 6.1 05/20/2015    Relevant past medical, surgical, family and social history reviewed and updated as indicated. Interim medical history since our last visit reviewed. Allergies and medications reviewed and updated. Current Outpatient Prescriptions on File Prior to Visit  Medication Sig  . aspirin 81 MG tablet Take 81 mg by mouth daily.    Marland Kitchen ibuprofen (ADVIL,MOTRIN) 200 MG tablet Take 200 mg by mouth at bedtime as needed.  . miconazole nitrate (MICATIN) POWD Apply 1 application topically daily.  . Multiple Vitamins-Minerals (MULTIVITAMIN PO) Take 1 tablet by mouth daily.  Marland Kitchen omeprazole (PRILOSEC) 40 MG capsule TAKE ONE CAPSULE BY MOUTH EVERY DAY AS NEEDED  . ONE TOUCH ULTRA TEST test strip USE AS DIRECTED  . pravastatin (PRAVACHOL) 20 MG tablet TAKE 1 TABLET (20 MG TOTAL) BY MOUTH AT BEDTIME.  Marland Kitchen sertraline (ZOLOFT) 25 MG tablet TAKE 1 TABLET (25 MG TOTAL) BY MOUTH DAILY.  . sildenafil (VIAGRA) 100 MG tablet Take 50-100 mg by mouth as needed for erectile dysfunction.  . topiramate (TOPAMAX) 50 MG tablet TAKE 1 TABLET BY MOUTH 2 TIMES DAILY   No  current facility-administered medications on file prior to visit.    Review of Systems Per HPI unless specifically indicated in ROS section     Objective:    BP 148/82 mmHg  Pulse 89  Temp(Src) 98.7 F (37.1 C) (Oral)  Wt 275 lb 8 oz (124.966 kg)  SpO2 98%  Wt Readings from Last 3 Encounters:  08/20/15 275 lb 8 oz (124.966 kg)  05/20/15 267 lb 12 oz (121.451 kg)  02/11/15 264 lb 12 oz (120.09 kg)    Physical Exam  Constitutional: He appears well-developed and well-nourished. No distress.  HENT:  Head: Normocephalic and atraumatic.  Right Ear: Hearing, tympanic membrane, external ear and ear canal normal.  Left Ear: Hearing, tympanic membrane, external ear and ear canal normal.  Nose: Nose normal. No mucosal edema or rhinorrhea. Right sinus exhibits no maxillary sinus tenderness and no frontal sinus tenderness. Left sinus exhibits no maxillary sinus tenderness and no frontal sinus tenderness.  Mouth/Throat: Uvula is midline and mucous membranes are normal. Posterior oropharyngeal erythema present. No oropharyngeal exudate, posterior oropharyngeal edema or tonsillar abscesses.  Some nasal congestion L>R R narrow nasal passage  Eyes: Conjunctivae and EOM are normal. Pupils are equal, round, and reactive to light. No scleral icterus.  Neck: Normal range of motion. Neck supple.  Cardiovascular: Normal rate, regular rhythm, normal heart sounds and intact distal pulses.   No murmur  heard. Pulmonary/Chest: Effort normal and breath sounds normal. No respiratory distress. He has no wheezes. He has no rales.  Deep dry hacking cough present  Lymphadenopathy:    He has no cervical adenopathy.  Skin: Skin is warm and dry. No rash noted.  Nursing note and vitals reviewed.  Results for orders placed or performed in visit on 05/20/15  Hemoglobin A1c  Result Value Ref Range   Hgb A1c MFr Bld 6.1 4.6 - 6.5 %  LDL Cholesterol, Direct  Result Value Ref Range   Direct LDL 99.0 mg/dL        Assessment & Plan:   Problem List Items Addressed This Visit    Essential hypertension    Possible ACEI - induced cough. Change lisinopril to losartan       Relevant Medications   losartan (COZAAR) 50 MG tablet   Acute sinusitis - Primary    Given duration of sxs rec treat with doxy/cheratussin course. Update if persistent sxs despite treatment.  Possible ACEI -induced cough - change ACEI to ARB losartan 50mg  daily.  Pt agrees with plan.      Relevant Medications   guaiFENesin-codeine (CHERATUSSIN AC) 100-10 MG/5ML syrup   doxycycline (VIBRA-TABS) 100 MG tablet       Follow up plan: No Follow-up on file.

## 2015-08-20 NOTE — Assessment & Plan Note (Signed)
Given duration of sxs rec treat with doxy/cheratussin course. Update if persistent sxs despite treatment.  Possible ACEI -induced cough - change ACEI to ARB losartan 50mg  daily.  Pt agrees with plan.

## 2015-08-20 NOTE — Patient Instructions (Signed)
Change lisinopril to losartan - possibly contributing. I recommend treating you for sinus infection with doxycycline course and codeine cough syrup. Let us know if not improved after this.  Continue fluids and plenty of rest. Continue mucinex.

## 2015-08-20 NOTE — Progress Notes (Signed)
Pre visit review using our clinic review tool, if applicable. No additional management support is needed unless otherwise documented below in the visit note. 

## 2015-09-09 ENCOUNTER — Other Ambulatory Visit: Payer: Self-pay | Admitting: Family Medicine

## 2015-09-09 ENCOUNTER — Other Ambulatory Visit: Payer: Federal, State, Local not specified - PPO

## 2015-09-09 DIAGNOSIS — I10 Essential (primary) hypertension: Secondary | ICD-10-CM

## 2015-09-09 DIAGNOSIS — Z125 Encounter for screening for malignant neoplasm of prostate: Secondary | ICD-10-CM

## 2015-09-09 DIAGNOSIS — E119 Type 2 diabetes mellitus without complications: Secondary | ICD-10-CM

## 2015-09-09 DIAGNOSIS — Z1159 Encounter for screening for other viral diseases: Secondary | ICD-10-CM

## 2015-09-09 DIAGNOSIS — E785 Hyperlipidemia, unspecified: Secondary | ICD-10-CM

## 2015-09-10 ENCOUNTER — Other Ambulatory Visit: Payer: Self-pay | Admitting: Family Medicine

## 2015-09-10 ENCOUNTER — Other Ambulatory Visit (INDEPENDENT_AMBULATORY_CARE_PROVIDER_SITE_OTHER): Payer: Federal, State, Local not specified - PPO

## 2015-09-10 DIAGNOSIS — I10 Essential (primary) hypertension: Secondary | ICD-10-CM | POA: Diagnosis not present

## 2015-09-10 DIAGNOSIS — E785 Hyperlipidemia, unspecified: Secondary | ICD-10-CM | POA: Diagnosis not present

## 2015-09-10 DIAGNOSIS — Z125 Encounter for screening for malignant neoplasm of prostate: Secondary | ICD-10-CM | POA: Diagnosis not present

## 2015-09-10 DIAGNOSIS — E119 Type 2 diabetes mellitus without complications: Secondary | ICD-10-CM

## 2015-09-10 DIAGNOSIS — Z1159 Encounter for screening for other viral diseases: Secondary | ICD-10-CM

## 2015-09-10 LAB — HEMOGLOBIN A1C: Hgb A1c MFr Bld: 6.7 % — ABNORMAL HIGH (ref 4.6–6.5)

## 2015-09-10 LAB — PSA: PSA: 0.55 ng/mL (ref 0.10–4.00)

## 2015-09-10 LAB — LIPID PANEL
CHOLESTEROL: 192 mg/dL (ref 0–200)
HDL: 72.4 mg/dL (ref >39.00)
LDL CALC: 92 mg/dL (ref 0–99)
NONHDL: 120.01
Total CHOL/HDL Ratio: 3
Triglycerides: 140 mg/dL (ref 0.0–149.0)
VLDL: 28 mg/dL (ref 0.0–40.0)

## 2015-09-10 LAB — BASIC METABOLIC PANEL
BUN: 13 mg/dL (ref 6–23)
CHLORIDE: 108 meq/L (ref 96–112)
CO2: 25 mEq/L (ref 19–32)
CREATININE: 1.02 mg/dL (ref 0.40–1.50)
Calcium: 9.3 mg/dL (ref 8.4–10.5)
GFR: 78.6 mL/min (ref >60.00)
GLUCOSE: 187 mg/dL — AB (ref 70–99)
POTASSIUM: 4.5 meq/L (ref 3.5–5.1)
Sodium: 141 mEq/L (ref 135–145)

## 2015-09-11 LAB — HEPATITIS C ANTIBODY: HCV AB: NEGATIVE

## 2015-09-13 ENCOUNTER — Other Ambulatory Visit: Payer: Self-pay | Admitting: Family Medicine

## 2015-09-18 ENCOUNTER — Ambulatory Visit (INDEPENDENT_AMBULATORY_CARE_PROVIDER_SITE_OTHER): Payer: Federal, State, Local not specified - PPO | Admitting: Family Medicine

## 2015-09-18 ENCOUNTER — Encounter: Payer: Self-pay | Admitting: Family Medicine

## 2015-09-18 VITALS — BP 140/88 | HR 71 | Temp 98.0°F | Ht 71.0 in | Wt 276.0 lb

## 2015-09-18 DIAGNOSIS — Z0000 Encounter for general adult medical examination without abnormal findings: Secondary | ICD-10-CM

## 2015-09-18 DIAGNOSIS — E119 Type 2 diabetes mellitus without complications: Secondary | ICD-10-CM

## 2015-09-18 DIAGNOSIS — E785 Hyperlipidemia, unspecified: Secondary | ICD-10-CM | POA: Diagnosis not present

## 2015-09-18 DIAGNOSIS — L280 Lichen simplex chronicus: Secondary | ICD-10-CM

## 2015-09-18 DIAGNOSIS — K219 Gastro-esophageal reflux disease without esophagitis: Secondary | ICD-10-CM

## 2015-09-18 DIAGNOSIS — F331 Major depressive disorder, recurrent, moderate: Secondary | ICD-10-CM

## 2015-09-18 DIAGNOSIS — R05 Cough: Secondary | ICD-10-CM

## 2015-09-18 DIAGNOSIS — I10 Essential (primary) hypertension: Secondary | ICD-10-CM

## 2015-09-18 DIAGNOSIS — R011 Cardiac murmur, unspecified: Secondary | ICD-10-CM

## 2015-09-18 DIAGNOSIS — IMO0001 Reserved for inherently not codable concepts without codable children: Secondary | ICD-10-CM

## 2015-09-18 DIAGNOSIS — R053 Chronic cough: Secondary | ICD-10-CM

## 2015-09-18 NOTE — Assessment & Plan Note (Signed)
Not appreciated today.  

## 2015-09-18 NOTE — Assessment & Plan Note (Signed)
Chronic, stable. Continue current regimen of losartan 50mg  daily.

## 2015-09-18 NOTE — Assessment & Plan Note (Addendum)
Did not respond to change off ACEI.  Continue omeprazole 40mg  daily - pt denies GERD sxs. Trial OTC antihistamine for PNDrainage induced cough.

## 2015-09-18 NOTE — Progress Notes (Signed)
Pre visit review using our clinic review tool, if applicable. No additional management support is needed unless otherwise documented below in the visit note. 

## 2015-09-18 NOTE — Assessment & Plan Note (Signed)
Chronic, stable. Noted bump in A1c. Will work on increased activity and reassess in 6 months.

## 2015-09-18 NOTE — Assessment & Plan Note (Signed)
Chronic, stable. Continue pravastatin. 

## 2015-09-18 NOTE — Assessment & Plan Note (Addendum)
Saw Dr [name], doing well with PRN steroid salve.  Discussed antihistamine use for itching.

## 2015-09-18 NOTE — Patient Instructions (Addendum)
Trial antihistamine daily for 1-2 weeks to see if any effect on cough. You are doing well today. Return as needed or in 6 months for diabetes follow up and labs  Health Maintenance, Male A healthy lifestyle and preventative care can promote health and wellness.  Maintain regular health, dental, and eye exams.  Eat a healthy diet. Foods like vegetables, fruits, whole grains, low-fat dairy products, and lean protein foods contain the nutrients you need and are low in calories. Decrease your intake of foods high in solid fats, added sugars, and salt. Get information about a proper diet from your health care provider, if necessary.  Regular physical exercise is one of the most important things you can do for your health. Most adults should get at least 150 minutes of moderate-intensity exercise (any activity that increases your heart rate and causes you to sweat) each week. In addition, most adults need muscle-strengthening exercises on 2 or more days a week.   Maintain a healthy weight. The body mass index (BMI) is a screening tool to identify possible weight problems. It provides an estimate of body fat based on height and weight. Your health care provider can find your BMI and can help you achieve or maintain a healthy weight. For males 20 years and older:  A BMI below 18.5 is considered underweight.  A BMI of 18.5 to 24.9 is normal.  A BMI of 25 to 29.9 is considered overweight.  A BMI of 30 and above is considered obese.  Maintain normal blood lipids and cholesterol by exercising and minimizing your intake of saturated fat. Eat a balanced diet with plenty of fruits and vegetables. Blood tests for lipids and cholesterol should begin at age 53 and be repeated every 5 years. If your lipid or cholesterol levels are high, you are over age 92, or you are at high risk for heart disease, you may need your cholesterol levels checked more frequently.Ongoing high lipid and cholesterol levels should be  treated with medicines if diet and exercise are not working.  If you smoke, find out from your health care provider how to quit. If you do not use tobacco, do not start.  Lung cancer screening is recommended for adults aged 71-80 years who are at high risk for developing lung cancer because of a history of smoking. A yearly low-dose CT scan of the lungs is recommended for people who have at least a 30-pack-year history of smoking and are current smokers or have quit within the past 15 years. A pack year of smoking is smoking an average of 1 pack of cigarettes a day for 1 year (for example, a 30-pack-year history of smoking could mean smoking 1 pack a day for 30 years or 2 packs a day for 15 years). Yearly screening should continue until the smoker has stopped smoking for at least 15 years. Yearly screening should be stopped for people who develop a health problem that would prevent them from having lung cancer treatment.  If you choose to drink alcohol, do not have more than 2 drinks per day. One drink is considered to be 12 oz (360 mL) of beer, 5 oz (150 mL) of wine, or 1.5 oz (45 mL) of liquor.  Avoid the use of street drugs. Do not share needles with anyone. Ask for help if you need support or instructions about stopping the use of drugs.  High blood pressure causes heart disease and increases the risk of stroke. High blood pressure is more likely to  develop in:  People who have blood pressure in the end of the normal range (100-139/85-89 mm Hg).  People who are overweight or obese.  People who are African American.  If you are 61-80 years of age, have your blood pressure checked every 3-5 years. If you are 47 years of age or older, have your blood pressure checked every year. You should have your blood pressure measured twice--once when you are at a hospital or clinic, and once when you are not at a hospital or clinic. Record the average of the two measurements. To check your blood pressure  when you are not at a hospital or clinic, you can use:  An automated blood pressure machine at a pharmacy.  A home blood pressure monitor.  If you are 64-34 years old, ask your health care provider if you should take aspirin to prevent heart disease.  Diabetes screening involves taking a blood sample to check your fasting blood sugar level. This should be done once every 3 years after age 51 if you are at a normal weight and without risk factors for diabetes. Testing should be considered at a younger age or be carried out more frequently if you are overweight and have at least 1 risk factor for diabetes.  Colorectal cancer can be detected and often prevented. Most routine colorectal cancer screening begins at the age of 47 and continues through age 8. However, your health care provider may recommend screening at an earlier age if you have risk factors for colon cancer. On a yearly basis, your health care provider may provide home test kits to check for hidden blood in the stool. A small camera at the end of a tube may be used to directly examine the colon (sigmoidoscopy or colonoscopy) to detect the earliest forms of colorectal cancer. Talk to your health care provider about this at age 65 when routine screening begins. A direct exam of the colon should be repeated every 5-10 years through age 108, unless early forms of precancerous polyps or small growths are found.  People who are at an increased risk for hepatitis B should be screened for this virus. You are considered at high risk for hepatitis B if:  You were born in a country where hepatitis B occurs often. Talk with your health care provider about which countries are considered high risk.  Your parents were born in a high-risk country and you have not received a shot to protect against hepatitis B (hepatitis B vaccine).  You have HIV or AIDS.  You use needles to inject street drugs.  You live with, or have sex with, someone who has  hepatitis B.  You are a man who has sex with other men (MSM).  You get hemodialysis treatment.  You take certain medicines for conditions like cancer, organ transplantation, and autoimmune conditions.  Hepatitis C blood testing is recommended for all people born from [birth year] through 1967 and any individual with known risk factors for hepatitis C.  Healthy men should no longer receive prostate-specific antigen (PSA) blood tests as part of routine cancer screening. Talk to your health care provider about prostate cancer screening.  Testicular cancer screening is not recommended for adolescents or adult males who have no symptoms. Screening includes self-exam, a health care provider exam, and other screening tests. Consult with your health care provider about any symptoms you have or any concerns you have about testicular cancer.  Practice safe sex. Use condoms and avoid high-risk sexual practices to reduce  the spread of sexually transmitted infections (STIs).  You should be screened for STIs, including gonorrhea and chlamydia if:  You are sexually active and are younger than 24 years.  You are older than 24 years, and your health care provider tells you that you are at risk for this type of infection.  Your sexual activity has changed since you were last screened, and you are at an increased risk for chlamydia or gonorrhea. Ask your health care provider if you are at risk.  If you are at risk of being infected with HIV, it is recommended that you take a prescription medicine daily to prevent HIV infection. This is called pre-exposure prophylaxis (PrEP). You are considered at risk if:  You are a man who has sex with other men (MSM).  You are a heterosexual man who is sexually active with multiple partners.  You take drugs by injection.  You are sexually active with a partner who has HIV.  Talk with your health care provider about whether you are at high risk of being infected with HIV. If  you choose to begin PrEP, you should first be tested for HIV. You should then be tested every 3 months for as long as you are taking PrEP.  Use sunscreen. Apply sunscreen liberally and repeatedly throughout the day. You should seek shade when your shadow is shorter than you. Protect yourself by wearing long sleeves, pants, a wide-brimmed hat, and sunglasses year round whenever you are outdoors.  Tell your health care provider of new moles or changes in moles, especially if there is a change in shape or color. Also, tell your health care provider if a mole is larger than the size of a pencil eraser.  A one-time screening for abdominal aortic aneurysm (AAA) and surgical repair of large AAAs by ultrasound is recommended for men aged 65-75 years who are current or former smokers.  Stay current with your vaccines (immunizations).   This information is not intended to replace advice given to you by your health care provider. Make sure you discuss any questions you have with your health care provider.   Document Released: 11/28/2007 Document Revised: 06/22/2014 Document Reviewed: 10/27/2010 Elsevier Interactive Patient Education 2016 [employer] Inc[employer]  

## 2015-09-18 NOTE — Assessment & Plan Note (Signed)
Preventative protocols reviewed and updated unless pt declined. Discussed healthy diet and lifestyle.  

## 2015-09-18 NOTE — Assessment & Plan Note (Signed)
Discussed healthy diet and lifestyle changes to affect sustainable weight loss  

## 2015-09-18 NOTE — Assessment & Plan Note (Signed)
Stable on sertraline 25mg , notes mild apathy. Declines change in med.

## 2015-09-18 NOTE — Progress Notes (Signed)
BP 140/88 mmHg  Pulse 71  Temp(Src) 98 F (36.7 C) (Oral)  Ht 5\' 11"  (1.803 m)  Wt 276 lb (125.193 kg)  BMI 38.51 kg/m2  SpO2 97%   CC: CPE  Subjective:    Patient ID: Evan [name], male    DOB: [birth year], 60 y.o.   MRN: [id number]  HPI: Evan [name] is a 60 y.o. male presenting on 09/18/2015 for Annual Exam   Last month thought persistent cough >1 yr was ACEI related vs sinusitis related - treated with change from lisinopril to losartan and doxy/cheartussin course and continued omeprazole 40mg  daily. Persistent intermittent cough. No fevers. Mild production. No dyspnea or chest pain.   Saw dermatologist for groin itch - treated with steroid salve for intermittent lichen simplex chronicus.   Preventative: COLONOSCOPY Date: 03/2015 2 polyps, rpt 5 yrs [name]) ESOPHAGOGASTRODUODENOSCOPY Date: 03/2015 mild chronic gastritis, esophagitis  Prostate cancer screening - gets PSA done every few years. Brother with prostate cancer. Last PSA and DRE was 2011.  Flu shot yealry Tdap 04/2012  Pneumovax 12/2012 zostavax - declines. Has had recurrent shingles in the past Seat belt and sunscreen use discussed. No changing moles on skin.   Caffeine: 3 cups coffee/day  Lives with wife and daughter, son in college, 2 cats and 1 dog  Occupation: Advice worker for [employer]  Edu: masters degree  Activity: walks 22min/day  Diet: good water, fruits/vegetables daily. Keeps record of caloric intake  Relevant past medical, surgical, family and social history reviewed and updated as indicated. Interim medical history since our last visit reviewed. Allergies and medications reviewed and updated. Current Outpatient Prescriptions on File Prior to Visit  Medication Sig  . aspirin 81 MG tablet Take 81 mg by mouth daily.    Marland Kitchen ibuprofen (ADVIL,MOTRIN) 200 MG tablet Take 200 mg by mouth at bedtime as needed.  Marland Kitchen losartan (COZAAR) 50 MG tablet Take 1 tablet (50 mg total) by mouth daily.    . miconazole nitrate (MICATIN) POWD Apply 1 application topically daily.  . Multiple Vitamins-Minerals (MULTIVITAMIN PO) Take 1 tablet by mouth daily.  . ONE TOUCH ULTRA TEST test strip USE AS DIRECTED  . pravastatin (PRAVACHOL) 20 MG tablet TAKE 1 TABLET (20 MG TOTAL) BY MOUTH AT BEDTIME.  Marland Kitchen sertraline (ZOLOFT) 25 MG tablet TAKE 1 TABLET (25 MG TOTAL) BY MOUTH DAILY.  . sildenafil (VIAGRA) 100 MG tablet Take 50-100 mg by mouth as needed for erectile dysfunction.  . topiramate (TOPAMAX) 50 MG tablet TAKE 1 TABLET BY MOUTH 2 TIMES DAILY   No current facility-administered medications on file prior to visit.    Review of Systems  Constitutional: Negative for fever, chills, activity change, appetite change, fatigue and unexpected weight change.  HENT: Positive for congestion. Negative for hearing loss.   Eyes: Negative for visual disturbance.  Respiratory: Positive for cough. Negative for chest tightness, shortness of breath and wheezing.   Cardiovascular: Negative for chest pain, palpitations and leg swelling.  Gastrointestinal: Negative for nausea, vomiting, abdominal pain, diarrhea, constipation, blood in stool and abdominal distention.  Genitourinary: Negative for hematuria and difficulty urinating.  Musculoskeletal: Negative for myalgias, arthralgias and neck pain.  Skin: Negative for rash.  Neurological: Negative for dizziness, seizures, syncope and headaches.  Hematological: Negative for adenopathy. Does not bruise/bleed easily.  Psychiatric/Behavioral: Negative for dysphoric mood. The patient is not nervous/anxious.    Per HPI unless specifically indicated in ROS section     Objective:    BP 140/88 mmHg  Pulse  71  Temp(Src) 98 F (36.7 C) (Oral)  Ht 5\' 11"  (1.803 m)  Wt 276 lb (125.193 kg)  BMI 38.51 kg/m2  SpO2 97%  Wt Readings from Last 3 Encounters:  09/18/15 276 lb (125.193 kg)  08/20/15 275 lb 8 oz (124.966 kg)  05/20/15 267 lb 12 oz (121.451 kg)    Physical Exam   Constitutional: He is oriented to person, place, and time. He appears well-developed and well-nourished. No distress.  HENT:  Head: Normocephalic and atraumatic.  Right Ear: Hearing, tympanic membrane, external ear and ear canal normal.  Left Ear: Hearing, tympanic membrane, external ear and ear canal normal.  Nose: Nose normal.  Mouth/Throat: Uvula is midline, oropharynx is clear and moist and mucous membranes are normal. No oropharyngeal exudate, posterior oropharyngeal edema or posterior oropharyngeal erythema.  Eyes: Conjunctivae and EOM are normal. Pupils are equal, round, and reactive to light. No scleral icterus.  Neck: Normal range of motion. Neck supple. No thyromegaly present.  Cardiovascular: Normal rate, regular rhythm, normal heart sounds and intact distal pulses.   No murmur heard. Pulses:      Radial pulses are 2+ on the right side, and 2+ on the left side.  Pulmonary/Chest: Effort normal and breath sounds normal. No respiratory distress. He has no wheezes. He has no rales.  Abdominal: Soft. Bowel sounds are normal. He exhibits no distension and no mass. There is no tenderness. There is no rebound and no guarding.  Genitourinary: Rectum normal. Rectal exam shows no external hemorrhoid, no internal hemorrhoid, no fissure, no mass, no tenderness and anal tone normal. Prostate is enlarged (30gm). Prostate is not tender.  Musculoskeletal: Normal range of motion. He exhibits no edema.  Lymphadenopathy:    He has no cervical adenopathy.  Neurological: He is alert and oriented to person, place, and time.  CN grossly intact, station and gait intact  Skin: Skin is warm and dry. No rash noted.  Psychiatric: He has a normal mood and affect. His behavior is normal. Judgment and thought content normal.  Nursing note and vitals reviewed.  Results for orders placed or performed in visit on 09/10/15  Lipid panel  Result Value Ref Range   Cholesterol 192 0 - 200 mg/dL   Triglycerides  140.0 0.0 - 149.0 mg/dL   HDL 72.40 >39.00 mg/dL   VLDL 28.0 0.0 - 40.0 mg/dL   LDL Cholesterol 92 0 - 99 mg/dL   Total CHOL/HDL Ratio 3    NonHDL 120.01   Hemoglobin A1c  Result Value Ref Range   Hgb A1c MFr Bld 6.7 (H) 4.6 - 6.5 %  PSA  Result Value Ref Range   PSA 0.55 0.10 - 4.00 ng/mL  Basic metabolic panel  Result Value Ref Range   Sodium 141 135 - 145 mEq/L   Potassium 4.5 3.5 - 5.1 mEq/L   Chloride 108 96 - 112 mEq/L   CO2 25 19 - 32 mEq/L   Glucose, Bld 187 (H) 70 - 99 mg/dL   BUN 13 6 - 23 mg/dL   Creatinine, Ser 1.02 0.40 - 1.50 mg/dL   Calcium 9.3 8.4 - 10.5 mg/dL   GFR 78.60 >60.00 mL/min      Assessment & Plan:   Problem List Items Addressed This Visit    Diabetes type 2, controlled (HCC)    Chronic, stable. Noted bump in A1c. Will work on increased activity and reassess in 6 months.       Relevant Medications   metFORMIN (GLUCOPHAGE) 500 MG  tablet   HLD (hyperlipidemia)    Chronic, stable. Continue pravastatin.      MDD (major depressive disorder), recurrent episode, moderate (HCC)    Stable on sertraline 25mg , notes mild apathy. Declines change in med.      Essential hypertension    Chronic, stable. Continue current regimen of losartan 50mg  daily.      Healthcare maintenance - Primary    Preventative protocols reviewed and updated unless pt declined. Discussed healthy diet and lifestyle.       GERD (gastroesophageal reflux disease)    Chronic, stable. Continue PPI daily.      Relevant Medications   omeprazole (PRILOSEC) 40 MG capsule   Lichen simplex chronicus    Saw Dr [name], doing well with PRN steroid salve.  Discussed antihistamine use for itching.      Obesity, Class II, BMI 35-39.9, with comorbidity (HCC)    Discussed healthy diet and lifestyle changes to affect sustainable weight loss       Relevant Medications   metFORMIN (GLUCOPHAGE) 500 MG tablet   Systolic murmur    Not appreciated today.      Chronic cough    Did  not respond to change off ACEI.  Continue omeprazole 40mg  daily - pt denies GERD sxs. Trial OTC antihistamine for PNDrainage induced cough.           Follow up plan: Return in about 6 months (around 03/19/2016), or as needed, for follow up visit.  Ria Bush, MD

## 2015-09-18 NOTE — Assessment & Plan Note (Signed)
Chronic, stable. Continue PPI daily.  

## 2015-10-08 ENCOUNTER — Other Ambulatory Visit: Payer: Self-pay | Admitting: Family Medicine

## 2015-10-09 MED ORDER — SILDENAFIL CITRATE 100 MG PO TABS: 50.0000 mg | ORAL_TABLET | ORAL | Status: DC | PRN

## 2015-12-23 ENCOUNTER — Other Ambulatory Visit: Payer: Self-pay | Admitting: Family Medicine

## 2015-12-23 NOTE — Telephone Encounter (Signed)
Received refill request electronically Last refill 06/24/15 #180/1 refill Last office visit 09/18/15

## 2016-02-14 ENCOUNTER — Telehealth: Payer: Self-pay | Admitting: *Deleted

## 2016-02-14 NOTE — Telephone Encounter (Signed)
PT wrote in via [place] requesting an appointment: Still having swelling in upper abdomen, sometimes better sometimes worse. Having increasing periods of fatigue, flushing, tingling in the extremities, and general weakness.

## 2016-02-14 NOTE — Telephone Encounter (Signed)
I spoke with Dr Darnell Level and he said pt could be seen with available provider today or if pt wanted to see Dr Darnell Level schedule next week and if pt had problem could call office or go to [facility]. Pt advised and pt does want to see Dr Darnell Level; pt will be out of town next week and request appt on 02/24/16. Pt has appt with Dr Darnell Level on 02/24/16 at 2:15. Pt said has had symptoms for months.FYI to Dr Darnell Level.

## 2016-02-18 ENCOUNTER — Encounter: Payer: Self-pay | Admitting: Family Medicine

## 2016-02-18 ENCOUNTER — Ambulatory Visit (INDEPENDENT_AMBULATORY_CARE_PROVIDER_SITE_OTHER): Payer: Federal, State, Local not specified - PPO | Admitting: Family Medicine

## 2016-02-18 ENCOUNTER — Other Ambulatory Visit: Payer: Self-pay | Admitting: Family Medicine

## 2016-02-18 VITALS — BP 154/82 | HR 74 | Temp 97.9°F | Wt 304.0 lb

## 2016-02-18 DIAGNOSIS — Z23 Encounter for immunization: Secondary | ICD-10-CM

## 2016-02-18 DIAGNOSIS — I10 Essential (primary) hypertension: Secondary | ICD-10-CM

## 2016-02-18 DIAGNOSIS — R400 Somnolence: Secondary | ICD-10-CM

## 2016-02-18 DIAGNOSIS — R011 Cardiac murmur, unspecified: Secondary | ICD-10-CM

## 2016-02-18 DIAGNOSIS — R5382 Chronic fatigue, unspecified: Secondary | ICD-10-CM

## 2016-02-18 DIAGNOSIS — R42 Dizziness and giddiness: Secondary | ICD-10-CM | POA: Insufficient documentation

## 2016-02-18 DIAGNOSIS — K2100 Gastro-esophageal reflux disease with esophagitis, without bleeding: Secondary | ICD-10-CM

## 2016-02-18 DIAGNOSIS — R1011 Right upper quadrant pain: Secondary | ICD-10-CM | POA: Diagnosis not present

## 2016-02-18 DIAGNOSIS — G4710 Hypersomnia, unspecified: Secondary | ICD-10-CM

## 2016-02-18 DIAGNOSIS — F331 Major depressive disorder, recurrent, moderate: Secondary | ICD-10-CM | POA: Diagnosis not present

## 2016-02-18 DIAGNOSIS — G4733 Obstructive sleep apnea (adult) (pediatric): Secondary | ICD-10-CM | POA: Insufficient documentation

## 2016-02-18 DIAGNOSIS — R1013 Epigastric pain: Secondary | ICD-10-CM | POA: Insufficient documentation

## 2016-02-18 DIAGNOSIS — E119 Type 2 diabetes mellitus without complications: Secondary | ICD-10-CM

## 2016-02-18 DIAGNOSIS — G479 Sleep disorder, unspecified: Secondary | ICD-10-CM | POA: Insufficient documentation

## 2016-02-18 DIAGNOSIS — Z6841 Body Mass Index (BMI) 40.0 and over, adult: Secondary | ICD-10-CM

## 2016-02-18 LAB — COMPREHENSIVE METABOLIC PANEL
ALBUMIN: 4.3 g/dL (ref 3.5–5.2)
ALT: 37 U/L (ref 0–53)
AST: 27 U/L (ref 0–37)
Alkaline Phosphatase: 47 U/L (ref 39–117)
BILIRUBIN TOTAL: 0.4 mg/dL (ref 0.2–1.2)
BUN: 15 mg/dL (ref 6–23)
CALCIUM: 9.3 mg/dL (ref 8.4–10.5)
CHLORIDE: 102 meq/L (ref 96–112)
CO2: 27 mEq/L (ref 19–32)
CREATININE: 1.08 mg/dL (ref 0.40–1.50)
GFR: 73.47 mL/min (ref >60.00)
Glucose, Bld: 151 mg/dL — ABNORMAL HIGH (ref 70–99)
Potassium: 4.1 mEq/L (ref 3.5–5.1)
Sodium: 137 mEq/L (ref 135–145)
Total Protein: 7.5 g/dL (ref 6.0–8.3)

## 2016-02-18 LAB — CBC WITH DIFFERENTIAL/PLATELET
Basophils Absolute: 0 10*3/uL (ref 0.0–0.1)
Basophils Relative: 0.4 % (ref 0.0–3.0)
EOS PCT: 1.7 % (ref 0.0–5.0)
Eosinophils Absolute: 0.2 10*3/uL (ref 0.0–0.7)
HCT: 44.5 % (ref 39.0–52.0)
Hemoglobin: 15.2 g/dL (ref 13.0–17.0)
LYMPHS ABS: 2.2 10*3/uL (ref 0.7–4.0)
Lymphocytes Relative: 23.9 % (ref 12.0–46.0)
MCHC: 34.2 g/dL (ref 30.0–36.0)
MCV: 94.7 fl (ref 78.0–100.0)
MONO ABS: 0.9 10*3/uL (ref 0.1–1.0)
MONOS PCT: 9.7 % (ref 3.0–12.0)
NEUTROS ABS: 6 10*3/uL (ref 1.4–7.7)
NEUTROS PCT: 64.3 % (ref 43.0–77.0)
PLATELETS: 301 10*3/uL (ref 150.0–400.0)
RBC: 4.69 Mil/uL (ref 4.22–5.81)
RDW: 13.9 % (ref 11.5–15.5)
WBC: 9.4 10*3/uL (ref 4.0–10.5)

## 2016-02-18 LAB — VITAMIN B12: VITAMIN B 12: 315 pg/mL (ref 211–911)

## 2016-02-18 LAB — TSH: TSH: 2.75 u[IU]/mL (ref 0.35–4.50)

## 2016-02-18 LAB — HEMOGLOBIN A1C: Hgb A1c MFr Bld: 8 % — ABNORMAL HIGH (ref 4.6–6.5)

## 2016-02-18 LAB — VITAMIN D 25 HYDROXY (VIT D DEFICIENCY, FRACTURES): VITD: 26.51 ng/mL — ABNORMAL LOW (ref 30.00–100.00)

## 2016-02-18 LAB — LIPASE: Lipase: 25 U/L (ref 11.0–59.0)

## 2016-02-18 MED ORDER — VITAMIN D3 25 MCG (1000 UT) PO CAPS: 1.0000 | ORAL_CAPSULE | Freq: Every day | ORAL | Status: DC

## 2016-02-18 MED ORDER — VITAMIN B-12 1000 MCG PO TABS: 1000.0000 ug | ORAL_TABLET | Freq: Every day | ORAL | Status: DC

## 2016-02-18 MED ORDER — SERTRALINE HCL 50 MG PO TABS: 50.0000 mg | ORAL_TABLET | Freq: Every day | ORAL | 9 refills | Status: DC

## 2016-02-18 MED ORDER — METFORMIN HCL 850 MG PO TABS: 850.0000 mg | ORAL_TABLET | Freq: Two times a day (BID) | ORAL | 1 refills | Status: DC

## 2016-02-18 NOTE — Progress Notes (Signed)
Pre visit review using our clinic review tool, if applicable. No additional management support is needed unless otherwise documented below in the visit note. 

## 2016-02-18 NOTE — Assessment & Plan Note (Signed)
Ongoing, noted today.

## 2016-02-18 NOTE — Assessment & Plan Note (Signed)
Start eval with fatigue labwork. Discussed relation of worsening depression with fatigue and other symptoms endorsed today.

## 2016-02-18 NOTE — Assessment & Plan Note (Signed)
Chronic, deteriorated over last 3-4 months, pt denies trigger/stressful life event to trigger deterioration. Denies significant SI/HI. Contracts for safety.  Discussed further treatment options. He is not interested in counseling or in med change. Hesitantly agrees to increased sertraline - will send 50mg  dose to pharmacy.

## 2016-02-18 NOTE — Assessment & Plan Note (Signed)
Deteriorated. Discussed weight gain noted.

## 2016-02-18 NOTE — Assessment & Plan Note (Signed)
Chronic, deteriorated. Weight gain noted, pt endorses poor glycemic control. Update A1c, will likely need to titrate metformin.

## 2016-02-18 NOTE — Assessment & Plan Note (Addendum)
Some biliary colic characteristics. ?fatty liver related vs gallstone related - check labs, update abd [place]. Pt agrees with plan.

## 2016-02-18 NOTE — Assessment & Plan Note (Signed)
ESS = 9. Prior saw Dr Brett Fairy. No records of sleep study available, but patient states he was told mild enough didn't need CPAP. Was recommended oral appliance but was unable to find one.

## 2016-02-18 NOTE — Assessment & Plan Note (Signed)
Endorses good control on current regimen of omeprazole 40mg  daily.

## 2016-02-18 NOTE — Assessment & Plan Note (Signed)
Chronic, deteriorated, likely due to weight gain. No changes today.

## 2016-02-18 NOTE — Progress Notes (Signed)
BP (!) 154/82   Pulse 74   Temp 97.9 F (36.6 C) (Oral)   Wt (!) 304 lb (137.9 kg)   SpO2 95%   BMI 42.40 kg/m    CC: abd swelling, fatigue Subjective:    Patient ID: Evan [name], male    DOB: 1955-11-15, 60 y.o.   MRN: [id number]  HPI: Evan [name] is a 60 y.o. male presenting on 02/18/2016 for Abdominal Pain and Fatigue   Ongoing RUQ discomfort described as positional pressure pain that lasts hours, possible crescendo/decrescendo pain, and feels epigastric swelling ongoing since last year. Also endorsing generalized fatigue, lightheadedness, muscle fatigue, trouble concentrating over last several months.   Tested for sleep apnea previously - told mild OSA no need for CPAP. Daytime somnolence. Non-restorative sleep.   Weight gain noted. Checking sugars daily - fasting 195 this morning.   Denies fevers/chills, nausea/vomiting, diarrhea/constipation, gassiness, urinary changes, or boring back pain.   H/o NAFLD. Referred to GI late 2016 for ongoing epigastric pain s/p EGD with mild chronic gastritis/esophagitis, colonoscopy showed 2 polyps [name]). Started on omeprazole 40mg  daily. Feels this is well controlled.   Relevant past medical, surgical, family and social history reviewed and updated as indicated. Interim medical history since our last visit reviewed. Allergies and medications reviewed and updated. Current Outpatient Prescriptions on File Prior to Visit  Medication Sig  . aspirin 81 MG tablet Take 81 mg by mouth daily.    Marland Kitchen ibuprofen (ADVIL,MOTRIN) 200 MG tablet Take 200 mg by mouth at bedtime as needed.  Marland Kitchen losartan (COZAAR) 50 MG tablet Take 1 tablet (50 mg total) by mouth daily.  . metFORMIN (GLUCOPHAGE) 500 MG tablet Take 500 mg by mouth 2 (two) times daily with a meal.   . Multiple Vitamins-Minerals (MULTIVITAMIN PO) Take 1 tablet by mouth daily.  Marland Kitchen omeprazole (PRILOSEC) 40 MG capsule Take 1 capsule (40 mg total) by mouth daily.  . ONE TOUCH ULTRA TEST test  strip USE AS DIRECTED  . pravastatin (PRAVACHOL) 20 MG tablet TAKE 1 TABLET (20 MG TOTAL) BY MOUTH AT BEDTIME.  . sildenafil (VIAGRA) 100 MG tablet Take 0.5-1 tablets (50-100 mg total) by mouth as needed for erectile dysfunction.  . topiramate (TOPAMAX) 50 MG tablet TAKE 1 TABLET BY MOUTH 2 TIMES DAILY   No current facility-administered medications on file prior to visit.     Review of Systems Per HPI unless specifically indicated in ROS section     Objective:    BP (!) 154/82   Pulse 74   Temp 97.9 F (36.6 C) (Oral)   Wt (!) 304 lb (137.9 kg)   SpO2 95%   BMI 42.40 kg/m   Wt Readings from Last 3 Encounters:  02/18/16 (!) 304 lb (137.9 kg)  09/18/15 276 lb (125.2 kg)  08/20/15 275 lb 8 oz (125 kg)    Physical Exam  Constitutional: He appears well-developed and well-nourished. No distress.  HENT:  Mouth/Throat: Oropharynx is clear and moist. No oropharyngeal exudate.  Eyes: Conjunctivae and EOM are normal. Pupils are equal, round, and reactive to light.  Neck: Normal range of motion. Neck supple.  Cardiovascular: Normal rate, regular rhythm and intact distal pulses.   Murmur (2/6 SEM best RUSB) heard. Pulmonary/Chest: Effort normal and breath sounds normal. No respiratory distress. He has no wheezes. He has no rales.  Abdominal: Soft. Normal appearance and bowel sounds are normal. He exhibits no distension and no mass. There is no hepatosplenomegaly. There is tenderness (mild) in the  right upper quadrant. There is no rigidity, no rebound, no guarding, no CVA tenderness and negative Murphy's sign.  Musculoskeletal: He exhibits no edema.  Lymphadenopathy:    He has no cervical adenopathy.  Skin: Skin is warm and dry. No rash noted.  Psychiatric: He has a normal mood and affect.  Nursing note and vitals reviewed.  Results for orders placed or performed in visit on 09/10/15  Hepatitis C antibody  Result Value Ref Range   HCV Ab NEGATIVE NEGATIVE   Lab Results  Component  Value Date   HGBA1C 6.7 (H) 09/10/2015       Assessment & Plan:   Problem List Items Addressed This Visit    Chronic fatigue    Start eval with fatigue labwork. Discussed relation of worsening depression with fatigue and other symptoms endorsed today.       Relevant Orders   TSH   CBC with Differential/Platelet   Vitamin B12   VITAMIN D 25 Hydroxy (Vit-D Deficiency, Fractures)   Daytime somnolence    ESS = 9. Prior saw Dr Brett Fairy. No records of sleep study available, but patient states he was told mild enough didn't need CPAP. Was recommended oral appliance but was unable to find one.      Relevant Orders   Ambulatory referral to Neurology   Diabetes type 2, controlled ([place])    Chronic, deteriorated. Weight gain noted, pt endorses poor glycemic control. Update A1c, will likely need to titrate metformin.       Relevant Orders   Hemoglobin A1c   Essential hypertension    Chronic, deteriorated, likely due to weight gain. No changes today.       GERD (gastroesophageal reflux disease)    Endorses good control on current regimen of omeprazole 40mg  daily.       MDD (major depressive disorder), recurrent episode, moderate (HCC)    Chronic, deteriorated over last 3-4 months, pt denies trigger/stressful life event to trigger deterioration. Denies significant SI/HI. Contracts for safety.  Discussed further treatment options. He is not interested in counseling or in med change. Hesitantly agrees to increased sertraline - will send 50mg  dose to pharmacy.       Relevant Medications   sertraline (ZOLOFT) 50 MG tablet   Morbid obesity with BMI of 40.0-44.9, adult (HCC)    Deteriorated. Discussed weight gain noted.       RUQ abdominal pain - Primary    Some biliary colic characteristics. ?fatty liver related vs gallstone related - check labs, update abd [place]. Pt agrees with plan.      Relevant Orders   US Abdomen Complete   Comprehensive metabolic panel   CBC with  Differential/Platelet   Lipase   Systolic murmur    Ongoing, noted today.        Other Visit Diagnoses    Need for influenza vaccination       Relevant Orders   Flu Vaccine QUAD 36+ mos PF IM (Fluarix & Fluzone Quad PF) (Completed)       Follow up plan: Return in about 3 months (around 05/19/2016) for follow up visit.  Ria Bush, MD

## 2016-02-18 NOTE — Patient Instructions (Addendum)
Flu shot today Return in 3 months for follow up visit. Labs today. We will order abdominal ultrasound. We will refer you back to Dr Brett Fairy. Increase sertraline to 50mg  daily - new higher dose will be at pharmacy.

## 2016-02-24 ENCOUNTER — Ambulatory Visit: Payer: Federal, State, Local not specified - PPO | Admitting: Family Medicine

## 2016-02-27 ENCOUNTER — Ambulatory Visit
Admission: RE | Admit: 2016-02-27 | Discharge: 2016-02-27 | Disposition: A | Discharge status: Home / Self Care | Payer: Federal, State, Local not specified - PPO | Source: Ambulatory Visit | Attending: Family Medicine | Admitting: Family Medicine

## 2016-02-27 DIAGNOSIS — K760 Fatty (change of) liver, not elsewhere classified: Secondary | ICD-10-CM | POA: Diagnosis not present

## 2016-02-27 DIAGNOSIS — R1011 Right upper quadrant pain: Secondary | ICD-10-CM

## 2016-02-28 ENCOUNTER — Encounter: Payer: Self-pay | Admitting: Family Medicine

## 2016-02-29 ENCOUNTER — Other Ambulatory Visit: Payer: Self-pay | Admitting: Family Medicine

## 2016-02-29 DIAGNOSIS — R1011 Right upper quadrant pain: Secondary | ICD-10-CM

## 2016-03-05 ENCOUNTER — Ambulatory Visit (HOSPITAL_COMMUNITY)
Admission: RE | Admit: 2016-03-05 | Discharge: 2016-03-05 | Disposition: A | Discharge status: Home / Self Care | Payer: Federal, State, Local not specified - PPO | Source: Ambulatory Visit | Attending: Family Medicine | Admitting: Family Medicine

## 2016-03-05 DIAGNOSIS — R1011 Right upper quadrant pain: Secondary | ICD-10-CM | POA: Diagnosis not present

## 2016-03-05 DIAGNOSIS — R109 Unspecified abdominal pain: Secondary | ICD-10-CM | POA: Diagnosis not present

## 2016-03-05 MED ORDER — TECHNETIUM TC 99M MEBROFENIN IV KIT
5.2000 | PACK | Freq: Once | INTRAVENOUS | Status: AC | PRN
  Administered 2016-03-05: 5 via INTRAVENOUS

## 2016-03-09 ENCOUNTER — Ambulatory Visit (INDEPENDENT_AMBULATORY_CARE_PROVIDER_SITE_OTHER): Payer: Federal, State, Local not specified - PPO | Admitting: Neurology

## 2016-03-09 ENCOUNTER — Encounter: Payer: Self-pay | Admitting: Neurology

## 2016-03-09 VITALS — BP 158/92 | HR 76 | Resp 20 | Ht 72.0 in | Wt 300.0 lb

## 2016-03-09 DIAGNOSIS — R0683 Snoring: Secondary | ICD-10-CM

## 2016-03-09 DIAGNOSIS — F339 Major depressive disorder, recurrent, unspecified: Secondary | ICD-10-CM

## 2016-03-09 DIAGNOSIS — G4733 Obstructive sleep apnea (adult) (pediatric): Secondary | ICD-10-CM | POA: Diagnosis not present

## 2016-03-09 NOTE — Patient Instructions (Addendum)
Please remember to try to maintain good sleep hygiene, which means: Keep a regular sleep and wake schedule, try not to exercise or have a meal within 2 hours of your bedtime, try to keep your bedroom conducive for sleep, that is, cool and dark, without light distractors such as an illuminated alarm clock, and refrain from watching TV right before sleep or in the middle of the night and do not keep the TV or radio on during the night. Also, try not to use or play on electronic devices at bedtime, such as your cell phone, tablet PC or laptop. If you like to read at bedtime on an electronic device, try to dim the background light as much as possible. Do not eat in the middle of the night.   We will request a sleep study.    We will look for leg twitching and snoring or sleep apnea.   We will call you with the sleep study results and make a follow up appointment if needed.    Dizziness Dizziness is a common problem. It makes you feel unsteady or lightheaded. You may feel like you are about to pass out (faint). Dizziness can lead to injury if you stumble or fall. Anyone can get dizzy, but dizziness is more common in older adults. This condition can be caused by a number of things, including:  Medicines.  Dehydration.  Illness. HOME CARE Following these instructions may help with your condition: Eating and Drinking  Drink enough fluid to keep your pee (urine) clear or pale yellow. This helps to keep you from getting dehydrated. Try to drink more clear fluids, such as water.  Do not drink alcohol.  Limit how much caffeine you drink or eat if told by your doctor.  Limit how much salt you drink or eat if told by your doctor. Activity  Avoid making quick movements.  When you stand up from sitting in a chair, steady yourself until you feel okay.  In the morning, first sit up on the side of the bed. When you feel okay, stand slowly while you hold onto something. Do this until you know that your  balance is fine.  Move your legs often if you need to stand in one place for a long time. Tighten and relax your muscles in your legs while you are standing.  Do not drive or use heavy machinery if you feel dizzy.  Avoid bending down if you feel dizzy. Place items in your home so that they are easy for you to reach without leaning over. Lifestyle  Do not use any tobacco products, including cigarettes, chewing tobacco, or electronic cigarettes. If you need help quitting, ask your doctor.  Try to lower your stress level, such as with yoga or meditation. Talk with your doctor if you need help. General Instructions  Watch your dizziness for any changes.  Take medicines only as told by your doctor. Talk with your doctor if you think that your dizziness is caused by a medicine that you are taking.  Tell a friend or a family member that you are feeling dizzy. If he or she notices any changes in your behavior, have this person call your doctor.  Keep all follow-up visits as told by your doctor. This is important. GET HELP IF:  Your dizziness does not go away.  Your dizziness or light-headedness gets worse.  You feel sick to your stomach (nauseous).  You have trouble hearing.  You have new symptoms.  You are unsteady  on your feet or you feel like the room is spinning. GET HELP RIGHT AWAY IF:  You throw up (vomit) or have diarrhea and are unable to eat or drink anything.  You have trouble:  Talking.  Walking.  Swallowing.  Using your arms, hands, or legs.  You feel generally weak.  You are not thinking clearly or you have trouble forming sentences. It may take a friend or family member to notice this.  You have:  Chest pain.  Pain in your belly (abdomen).  Shortness of breath.  Sweating.  Your vision changes.  You are bleeding.  You have a headache.  You have neck pain or a stiff neck.  You have a fever.   This information is not intended to replace  advice given to you by your health care provider. Make sure you discuss any questions you have with your health care provider.   Document Released: 05/21/2011 Document Revised: 10/16/2014 Document Reviewed: 05/28/2014 Elsevier Interactive Patient Education [employer].

## 2016-03-09 NOTE — Progress Notes (Signed)
SLEEP MEDICINE CLINIC   Provider:  Larey Seat, M D  Referring Provider: Ria Bush, MD Primary Care Physician:  Ria Bush, MD  Chief Complaint  Patient presents with  . New Patient (Initial Visit)    has had a sleep study, pt does not think his sleep patterns have changed    HPI: Chief complaint according to patient :  GERASIMOS SCHMITH is a 60 y.o. male , seen here as a referral from Dr. Danise Mina for Evaluation of fatigue, but feels that his main problem is not daytime somnolence or poor sleep quality, but spells of lightheadedness lasting for hours. These are almost described as a trance- like state.  His wife and people that know him very well notice that he is not quite himself during the spells which can last for hours. He reports that she stated he looks like he doesn't feel well, but I'm not sure if this includes pallor or certain facial expression and body language. He never asked. The spells have occurred over the last 6-8 months ,not before. He reports no changes of medication or medical diagnoses corresponding with the timeframe. After I asked him about medication changes he recalled that losartan was introduced about 6-8 months ago.  He does not see a certain time of day when he is more likely to have a spell like it. There is no relation to food intake. When he has these spells he is not hungry, does not suffer palpitation or diaphoresis. There is no change in vision, sounds smell or taste but he does not have an appetite.  Sleep habits are as follows: goes to bed at 23 hours, and rises at 6.20, has usually 2 sleep interruptions, when he goes to the bathroom. He does snore. He sleeps on his side, but sometimes reverts to his back. He sleeps on multiple pillows, but the bedroom is otherwise described as cool quiet and dark. He shares a bedroom with his wife.    Sleep medical history : Mr. Logan Brandford underwent a sleep study on 03/05/2012 with a history of  morbid obesity with a BMI exceeding 40, which he does now. At the time he was on metformin, topiramate and aspirin he had endorsed the Epworth Sleepiness Scale at 10 points Becks inventory at 20 points and was treated for major depression. 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  His sleep latency was 4.5 minutes to sleep efficiency 88% the AHI was 10.9 and supine strongly accentuated as well as exenterated and REM sleep to 23.8 his lowest oxygen saturation was 84% desaturation time was 67 minutes. Treatment was advised and positive airway pressure therapy discussed as one option as favored in a patient was prolonged oxygen desaturation. He developed memory loss under the treatment was Topamax prescribed for migraines. Topamax also is meant to treat tremor. At the time his primary care physician was Dr. Linna Darner is former [facility] physician was Dr. Doy Mince. The tremor has improved and memory problems, too.    Social history: married, working full time as a Scientist, physiological, Pharmacist, hospital . He wants me to help him retire. He deosn't smoke, drinks less over the last couple of month, as alcohol was an issue in contributing to light headedness. .    Review of Systems: Out of a complete 14 system review, the patient complains of only the following symptoms, and all other reviewed systems are negative. clinical depression.  He related this to his  job.   Epworth score 7 , Fatigue severity score 58  , depression score 7/15    Social History   Social History  . Marital status: Married    Spouse name: N/A  . Number of children: N/A  . Years of education: N/A   Occupational History  . Not on file.   Social History Main Topics  . Smoking status: Never Smoker  . Smokeless tobacco: Never Used  . Alcohol use 0.0 oz/week     Comment: occasional  . Drug use: No  . Sexual activity: Not on file   Other Topics Concern  . 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  Keeps record of caloric intake    Family History  Problem Relation Age of Onset  . Alzheimer's disease Father   . Hypertension Father   . Stroke Father   . Hyperlipidemia Father   . Coronary artery disease Brother 46    diabetic, MI  . Prostate cancer Brother   . Diabetes Brother   . Coronary artery disease Maternal Uncle   . Schizophrenia Paternal Uncle   . Emphysema Mother     Past Medical History:  Diagnosis Date  . Depression    intolerant of cymbalta and wellbutrin  . Diverticulosis of colon   . Esophagitis 03/2015   by EGD  . Gastritis 03/2015   by EGD - mild, chronic  . Hyperlipidemia   . Hypertension   . Lichen simplex chronicus 05/2015   R scrotum [name])  . Murmur    aortic sclerosis, SBE  prophlaxis not needed as per cardio  . NAFLD (nonalcoholic fatty liver disease) 01/2015   by [place]  . 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 T2DM (type 2 diabetes mellitus) ([place])    DSME 09/2013    Past Surgical History:  Procedure Laterality Date  . COLONOSCOPY  2006   diverticulosis per pt  . COLONOSCOPY  03/2015   2 polyps, rpt 5 yrs [name])  . ESOPHAGOGASTRODUODENOSCOPY  03/2015   mild chronic gastritis, esophagitis   . LUMBAR LAMINECTOMY  1989   L5-S1    Current Outpatient Prescriptions  Medication Sig Dispense Refill  . aspirin 81 MG tablet Take 81 mg by mouth daily.      . Cholecalciferol (VITAMIN D3) 1000 units CAPS Take 1 capsule (1,000 Units total) by mouth daily. 30 capsule   . Cyanocobalamin (VITAMIN B12) 3000 MCG SUBL Place 3,000 mcg under the tongue.    Marland Kitchen ibuprofen (ADVIL,MOTRIN) 200 MG tablet Take 200 mg by mouth at bedtime as needed.    Marland Kitchen losartan (COZAAR) 50 MG tablet  Take 1 tablet (50 mg total) by mouth daily. 30 tablet 6  . metFORMIN (GLUCOPHAGE) 850 MG tablet Take 1 tablet (850 mg total) by mouth 2 (two) times daily with a meal. 1850 tablet 1  . Multiple Vitamins-Minerals (MULTIVITAMIN PO) Take 1 tablet by mouth daily.    Marland Kitchen omeprazole (PRILOSEC) 40 MG capsule Take 1 capsule (40 mg total) by mouth daily.    . ONE TOUCH ULTRA TEST test strip USE AS DIRECTED 100 each 3  . pravastatin (PRAVACHOL) 20 MG tablet TAKE 1 TABLET (20 MG TOTAL) BY MOUTH AT BEDTIME.  90 tablet 3  . sertraline (ZOLOFT) 50 MG tablet Take 1 tablet (50 mg total) by mouth daily. 30 tablet 9  . sildenafil (VIAGRA) 100 MG tablet Take 0.5-1 tablets (50-100 mg total) by mouth as needed for erectile dysfunction. 10 tablet 1  . topiramate (TOPAMAX) 50 MG tablet TAKE 1 TABLET BY MOUTH 2 TIMES DAILY 180 tablet 1   No current facility-administered medications for this visit.     Allergies as of 03/09/2016 - Review Complete 03/09/2016  Allergen Reaction Noted  . Penicillins      Vitals: BP (!) 158/92   Pulse 76   Resp 20   Ht 6' (1.829 m)   Wt 300 lb (136.1 kg)   BMI 40.69 kg/m  Last Weight:  Wt Readings from Last 1 Encounters:  03/09/16 300 lb (136.1 kg)   [id number] mass index is 40.69 kg/m.     Last Height:   Ht Readings from Last 1 Encounters:  03/09/16 6' (1.829 m)    Physical exam:  General: The patient is awake, alert and appears not in acute distress. The patient is well groomed. Head: Normocephalic, atraumatic. Neck is supple. Mallampati 5,  neck circumference 18. Nasal airflow patent ,Retrognathia is seen. Crowded lower jaw.  Cardiovascular:  Regular rate and rhythm , without  murmurs or carotid bruit, and without distended neck veins. Respiratory: Lungs are clear to auscultation. Skin:  Without evidence of edema, or rash Trunk: BMI is over 40 . The patient's posture is poor.    Neurologic exam : The patient is awake and alert, oriented to place and time.   Memory  subjective described as intact.  Attention span & concentration ability appears normal.  Speech is fluent,  without dysarthria, dysphonia or aphasia.  Mood and affect are depressed .  Cranial nerves: Pupils are equal and briskly reactive to light. Funduscopic exam without  evidence of pallor or edema. Extraocular movements  in vertical and horizontal planes intact and without nystagmus. Visual fields by finger perimetry are intact.Hearing to finger rub intact. Facial sensation intact to fine touch.Facial motor strength is symmetric and tongue and uvula move midline. Shoulder shrug was symmetrical.   Motor exam:  Normal tone, muscle bulk and symmetric strength in all extremities. Sensory:  Fine touch, pinprick and vibration and  Proprioception tested in the upper extremities was normal. Coordination: Rapid alternating movements in the fingers/hands was normal. Finger-to-nose maneuver normal without evidence of ataxia, dysmetria or tremor.  Gait and station: Patient walks without assistive device and is able unassisted to climb up to the exam table. Strength within normal limits.  Stance is stable and normal.  Tandem gait is fragmented. Turns with 3  Steps. Romberg testing is negative.   Deep tendon reflexes: in the  upper and lower extremities are symmetric and intact. Babinski maneuver response is  downgoing.  The patient was advised of the nature of the diagnosed sleep disorder , the treatment options and risks for general a health and wellness arising from not treating the condition.  I spent more than 45 minutes of face to face time with the patient. Greater than 50% of time was spent in counseling and coordination of care. We have discussed the diagnosis and differential and I answered the patient's questions.     Assessment:  After physical and neurologic examination, review of laboratory studies,  Personal review of imaging studies, reports of other /same  Imaging studies ,  Results of  polysomnography/ neurophysiology testing and pre-existing records as far  as provided in visit., my assessment is   1) lightheadedness, lasting hours following emotional triggers or physical activity, but not exhaustion.  2) sleeps , according to fit- bit 6.5 hours each night. Non restorative sleep, not refreshing ( "never has been ").  3) diabetic for over 6 years now, with no complaint pf neuropathy.   Plan:  Treatment plan and additional workup :  He feels constantly overwhelmed and anxious. I do think that a clinical depression may contribute to his feelings of fatigue especially, and to his sleep pattern as well. I would have preferred in 2012/ 2013 to try CPAP with the patient. The patient was doubtful that he could sleep with a CPAP. He agrees with a home sleep test to evaluate the current level of apnea but also hypoxemia I'm concerned that that may be prolonged periods of low oxygen levels contributing to his feelings. Periods of multiple hours of lightheadedness or dizziness are very unlikely to be a seizure related problem that can be metabolically induced and sometimes are result of blood pressure medication or diabetes medication. Overall neurologic exam is nonfocal.     Larey Seat MD  03/09/2016   CC: Ria Bush, [place] [address] [place], [place] [address]

## 2016-03-13 ENCOUNTER — Other Ambulatory Visit: Payer: Self-pay | Admitting: Family Medicine

## 2016-03-13 MED ORDER — SERTRALINE HCL 50 MG PO TABS: 50.0000 mg | ORAL_TABLET | Freq: Every day | ORAL | 1 refills | Status: DC

## 2016-04-01 ENCOUNTER — Ambulatory Visit (INDEPENDENT_AMBULATORY_CARE_PROVIDER_SITE_OTHER): Payer: Federal, State, Local not specified - PPO | Admitting: Family Medicine

## 2016-04-01 ENCOUNTER — Encounter: Payer: Self-pay | Admitting: Family Medicine

## 2016-04-01 VITALS — BP 158/96 | HR 89 | Temp 98.1°F | Wt 302.2 lb

## 2016-04-01 DIAGNOSIS — E119 Type 2 diabetes mellitus without complications: Secondary | ICD-10-CM

## 2016-04-01 DIAGNOSIS — R011 Cardiac murmur, unspecified: Secondary | ICD-10-CM

## 2016-04-01 DIAGNOSIS — G43109 Migraine with aura, not intractable, without status migrainosus: Secondary | ICD-10-CM

## 2016-04-01 DIAGNOSIS — I10 Essential (primary) hypertension: Secondary | ICD-10-CM

## 2016-04-01 DIAGNOSIS — R0989 Other specified symptoms and signs involving the circulatory and respiratory systems: Secondary | ICD-10-CM

## 2016-04-01 DIAGNOSIS — R42 Dizziness and giddiness: Secondary | ICD-10-CM

## 2016-04-01 DIAGNOSIS — R1011 Right upper quadrant pain: Secondary | ICD-10-CM

## 2016-04-01 DIAGNOSIS — F331 Major depressive disorder, recurrent, moderate: Secondary | ICD-10-CM

## 2016-04-01 MED ORDER — LOSARTAN POTASSIUM 100 MG PO TABS: 100.0000 mg | ORAL_TABLET | Freq: Every day | ORAL | 6 refills | Status: DC

## 2016-04-01 NOTE — Assessment & Plan Note (Signed)
No improvement noted with higher sertraline dose. Pt adamant about not wanting to change antidepressant, wants to eventually come off all. Intolerant of cymbalta and wellbutrin in the past.

## 2016-04-01 NOTE — Patient Instructions (Signed)
Decrease topamax to 25mg  in am and 50mg  in pm. I wonder if topamax is causing some of your symptoms. Continue b12 and vitamin D.  Continue sertraline 50mg  daily.  Update me with effect in 2 weeks.

## 2016-04-01 NOTE — Assessment & Plan Note (Addendum)
Compliant with vit D and B12.  Endorses ongoing lightheadedness, mental fogginess and fatigue. Will trial lower topamax dose 25mg /50mg  daily. Pt to update me with effect in 2 wks.

## 2016-04-01 NOTE — Assessment & Plan Note (Signed)
Chronic, deteriorated. Will increase losartan to 100mg  daily.

## 2016-04-01 NOTE — Progress Notes (Signed)
Pre visit review using our clinic review tool, if applicable. No additional management support is needed unless otherwise documented below in the visit note. 

## 2016-04-01 NOTE — Assessment & Plan Note (Signed)
Chronic, possible improvement with increase in metformin. Continue higher dose for now. Pt declines further titration at this time.

## 2016-04-01 NOTE — Progress Notes (Signed)
BP (!) 158/96   Pulse 89   Temp 98.1 F (36.7 C) (Oral)   Wt (!) 302 lb 4 oz (137.1 kg)   SpO2 97%   BMI 40.99 kg/m    CC: f/u visit Subjective:    Patient ID: Evan [name], male    DOB: [birth year], 60 y.o.   MRN: [id number]  HPI: Evan [name] is a 60 y.o. male presenting on 04/01/2016 for Follow-up   See prior note for details. Ongoing RUQ discomfort associated with fatigue and worsening depression. No improvement despite increase in sertraline to 50mg  daily. For biliary colic type symptoms, abd [place] and HIDA scan performed - unrevealing. Just showing fatty liver changes. HIDA scan mentioned moderate discomfort with ensure intake. Trial off dairy products did not help. That has eased up some.   Today more concerned about lightheadedness and mental fogginess. No joint or body aches. Denies vertigo, presyncope or unsteadiness. Denies palpitations, headaches. Some dyspnea with exertion. Fasting cbg's staying elevated. Asks about chronic fatigue.   He saw Dr Evan [name] for OSA - recommended home sleep study. Pending eval for this. She did not think there was neurological issues at play.   H/o NAFLD. Referred to GI late 2016 for ongoing epigastric pain s/p EGD with mild chronic gastritis/esophagitis, colonoscopy showed 2 polyps Evan [name]). Started on omeprazole 40mg  daily. Feels this is well controlled.   Has been taking b12 3000 mcg daily.   Planning on retiring in the spring.   Relevant past medical, surgical, family and social history reviewed and updated as indicated. Interim medical history since our last visit reviewed. Allergies and medications reviewed and updated. Current Outpatient Prescriptions on File Prior to Visit  Medication Sig  . aspirin 81 MG tablet Take 81 mg by mouth daily.    . Cholecalciferol (VITAMIN D3) 1000 units CAPS Take 1 capsule (1,000 Units total) by mouth daily.  . Cyanocobalamin (VITAMIN B12) 3000 MCG SUBL Place 3,000 mcg under the tongue.  Marland Kitchen ibuprofen  (ADVIL,MOTRIN) 200 MG tablet Take 200 mg by mouth at bedtime as needed.  . metFORMIN (GLUCOPHAGE) 850 MG tablet Take 1 tablet (850 mg total) by mouth 2 (two) times daily with a meal.  . Multiple Vitamins-Minerals (MULTIVITAMIN PO) Take 1 tablet by mouth daily.  Marland Kitchen omeprazole (PRILOSEC) 40 MG capsule Take 1 capsule (40 mg total) by mouth daily.  . ONE TOUCH ULTRA TEST test strip USE AS DIRECTED  . pravastatin (PRAVACHOL) 20 MG tablet TAKE 1 TABLET (20 MG TOTAL) BY MOUTH AT BEDTIME.  Marland Kitchen sertraline (ZOLOFT) 50 MG tablet Take 1 tablet (50 mg total) by mouth daily.  . sildenafil (VIAGRA) 100 MG tablet Take 0.5-1 tablets (50-100 mg total) by mouth as needed for erectile dysfunction.   No current facility-administered medications on file prior to visit.     Review of Systems Per HPI unless specifically indicated in ROS section     Objective:    BP (!) 158/96   Pulse 89   Temp 98.1 F (36.7 C) (Oral)   Wt (!) 302 lb 4 oz (137.1 kg)   SpO2 97%   BMI 40.99 kg/m   Wt Readings from Last 3 Encounters:  04/01/16 (!) 302 lb 4 oz (137.1 kg)  03/09/16 300 lb (136.1 kg)  02/18/16 (!) 304 lb (137.9 kg)    Physical Exam  Constitutional: He appears well-developed and well-nourished. No distress.  HENT:  Mouth/Throat: Oropharynx is clear and moist. No oropharyngeal exudate.  Neck: Normal range of motion. Neck  supple. Carotid bruit is present (L sided). No thyromegaly present.  Cardiovascular: Normal rate, regular rhythm and intact distal pulses.   Murmur (3/6 SEM at LUSB) heard. Pulmonary/Chest: Effort normal and breath sounds normal. No respiratory distress. He has no wheezes. He has no rales.  Lymphadenopathy:    He has no cervical adenopathy.  Psychiatric: He exhibits a depressed mood.  Nursing note and vitals reviewed.  Results for orders placed or performed in visit on 02/18/16  Comprehensive metabolic panel  Result Value Ref Range   Sodium 137 135 - 145 mEq/L   Potassium 4.1 3.5 - 5.1  mEq/L   Chloride 102 96 - 112 mEq/L   CO2 27 19 - 32 mEq/L   Glucose, Bld 151 (H) 70 - 99 mg/dL   BUN 15 6 - 23 mg/dL   Creatinine, Ser 1.08 0.40 - 1.50 mg/dL   Total Bilirubin 0.4 0.2 - 1.2 mg/dL   Alkaline Phosphatase 47 39 - 117 U/L   AST 27 0 - 37 U/L   ALT 37 0 - 53 U/L   Total Protein 7.5 6.0 - 8.3 g/dL   Albumin 4.3 3.5 - 5.2 g/dL   Calcium 9.3 8.4 - 10.5 mg/dL   GFR 73.47 >60.00 mL/min  TSH  Result Value Ref Range   TSH 2.75 0.35 - 4.50 uIU/mL  Hemoglobin A1c  Result Value Ref Range   Hgb A1c MFr Bld 8.0 (H) 4.6 - 6.5 %  CBC with Differential/Platelet  Result Value Ref Range   WBC 9.4 4.0 - 10.5 K/uL   RBC 4.69 4.22 - 5.81 Mil/uL   Hemoglobin 15.2 13.0 - 17.0 g/dL   HCT 44.5 39.0 - 52.0 %   MCV 94.7 78.0 - 100.0 fl   MCHC 34.2 30.0 - 36.0 g/dL   RDW 13.9 11.5 - 15.5 %   Platelets 301.0 150.0 - 400.0 K/uL   Neutrophils Relative % 64.3 43.0 - 77.0 %   Lymphocytes Relative 23.9 12.0 - 46.0 %   Monocytes Relative 9.7 3.0 - 12.0 %   Eosinophils Relative 1.7 0.0 - 5.0 %   Basophils Relative 0.4 0.0 - 3.0 %   Neutro Abs 6.0 1.4 - 7.7 K/uL   Lymphs Abs 2.2 0.7 - 4.0 K/uL   Monocytes Absolute 0.9 0.1 - 1.0 K/uL   Eosinophils Absolute 0.2 0.0 - 0.7 K/uL   Basophils Absolute 0.0 0.0 - 0.1 K/uL  Vitamin B12  Result Value Ref Range   Vitamin B-12 315 211 - 911 pg/mL  VITAMIN D 25 Hydroxy (Vit-D Deficiency, Fractures)  Result Value Ref Range   VITD 26.51 (L) 30.00 - 100.00 ng/mL  Lipase  Result Value Ref Range   Lipase 25.0 11.0 - 59.0 U/L      Assessment & Plan:   Problem List Items Addressed This Visit    Diabetes type 2, controlled (Evan [name])    Chronic, possible improvement with increase in metformin. Continue higher dose for now. Pt declines further titration at this time.       Relevant Medications   losartan (COZAAR) 100 MG tablet   Essential hypertension    Chronic, deteriorated. Will increase losartan to 100mg  daily.      Relevant Medications   losartan  (COZAAR) 100 MG tablet   Lightheadedness - Primary    Compliant with vit D and B12.  Endorses ongoing lightheadedness, mental fogginess and fatigue. Will trial lower topamax dose 25mg /50mg  daily. Pt to update me with effect in 2 wks.  MDD (major depressive disorder), recurrent episode, moderate (HCC)    No improvement noted with higher sertraline dose. Pt adamant about not wanting to change antidepressant, wants to eventually come off all. Intolerant of cymbalta and wellbutrin in the past.       Ophthalmic migraine    Decrease topamax to 25/50mg  daily.      Relevant Medications   topiramate (TOPAMAX) 50 MG tablet   losartan (COZAAR) 100 MG tablet   RUQ abdominal pain    Endorses this has improved      Systolic murmur    Pt states he has known aortic sclerosis       Other Visit Diagnoses    Bruit of left carotid artery       Relevant Orders   VAS US CAROTID       Follow up plan: No Follow-up on file.  Ria Bush, MD

## 2016-04-01 NOTE — Assessment & Plan Note (Signed)
Pt states he has known aortic sclerosis

## 2016-04-01 NOTE — Assessment & Plan Note (Signed)
Decrease topamax to 25/50mg  daily.

## 2016-04-01 NOTE — Assessment & Plan Note (Signed)
Endorses this has improved

## 2016-04-02 ENCOUNTER — Encounter (INDEPENDENT_AMBULATORY_CARE_PROVIDER_SITE_OTHER): Payer: Federal, State, Local not specified - PPO | Admitting: Neurology

## 2016-04-02 DIAGNOSIS — R0683 Snoring: Secondary | ICD-10-CM

## 2016-04-02 DIAGNOSIS — G4733 Obstructive sleep apnea (adult) (pediatric): Secondary | ICD-10-CM

## 2016-04-02 DIAGNOSIS — F339 Major depressive disorder, recurrent, unspecified: Secondary | ICD-10-CM

## 2016-04-03 ENCOUNTER — Ambulatory Visit (HOSPITAL_COMMUNITY)
Admission: RE | Admit: 2016-04-03 | Discharge: 2016-04-03 | Disposition: A | Discharge status: Home / Self Care | Payer: Federal, State, Local not specified - PPO | Source: Ambulatory Visit | Attending: Cardiology | Admitting: Cardiology

## 2016-04-03 DIAGNOSIS — I6523 Occlusion and stenosis of bilateral carotid arteries: Secondary | ICD-10-CM | POA: Diagnosis not present

## 2016-04-03 DIAGNOSIS — R0989 Other specified symptoms and signs involving the circulatory and respiratory systems: Secondary | ICD-10-CM

## 2016-04-04 ENCOUNTER — Encounter: Payer: Self-pay | Admitting: Family Medicine

## 2016-04-04 DIAGNOSIS — I6529 Occlusion and stenosis of unspecified carotid artery: Secondary | ICD-10-CM | POA: Insufficient documentation

## 2016-04-08 ENCOUNTER — Telehealth: Payer: Self-pay

## 2016-04-08 NOTE — Telephone Encounter (Signed)
I spoke to pt regarding his sleep study results. I advised him that his HST revealed no apnea that was clinically significant. Significant hypoxemia was not seen. Dr. Brett Fairy wanted pt to know that it was a normal HST. I offered pt a follow up appt but pt declined. Pt verbalized understanding of results. Pt had no questions at this time but was encouraged to call back if questions arise. Pt asked that I fax a copy of this sleep study to Dr. Danise Mina, PCP.

## 2016-04-26 ENCOUNTER — Encounter: Payer: Self-pay | Admitting: Family Medicine

## 2016-05-03 ENCOUNTER — Other Ambulatory Visit: Payer: Self-pay | Admitting: Family Medicine

## 2016-05-11 DIAGNOSIS — K080 Exfoliation of teeth due to systemic causes: Secondary | ICD-10-CM | POA: Diagnosis not present

## 2016-05-22 ENCOUNTER — Ambulatory Visit: Payer: Federal, State, Local not specified - PPO | Admitting: Family Medicine

## 2016-05-29 ENCOUNTER — Ambulatory Visit: Payer: Federal, State, Local not specified - PPO | Admitting: Family Medicine

## 2016-06-26 ENCOUNTER — Other Ambulatory Visit: Payer: Self-pay | Admitting: Family Medicine

## 2016-07-30 ENCOUNTER — Other Ambulatory Visit: Payer: Self-pay | Admitting: Family Medicine

## 2016-09-02 DIAGNOSIS — K080 Exfoliation of teeth due to systemic causes: Secondary | ICD-10-CM | POA: Diagnosis not present

## 2016-09-14 ENCOUNTER — Other Ambulatory Visit: Payer: Self-pay | Admitting: Family Medicine

## 2016-09-14 NOTE — Telephone Encounter (Signed)
No recent or future f/u or CPE, last filled on 03/13/16 #90 with 1 additional refill, please advise

## 2016-10-09 ENCOUNTER — Other Ambulatory Visit: Payer: Self-pay | Admitting: *Deleted

## 2016-10-09 MED ORDER — LOSARTAN POTASSIUM 100 MG PO TABS: 100.0000 mg | ORAL_TABLET | Freq: Every day | ORAL | 0 refills | Status: DC

## 2016-10-26 ENCOUNTER — Other Ambulatory Visit: Payer: Self-pay | Admitting: Family Medicine

## 2016-10-26 NOTE — Telephone Encounter (Signed)
Last offie visit 04/01/2016.  Last Lipid 09/10/2015.   No future appointments scheduled.  Refill?

## 2016-10-31 ENCOUNTER — Other Ambulatory Visit: Payer: Self-pay | Admitting: Family Medicine

## 2016-12-14 ENCOUNTER — Telehealth: Payer: Self-pay

## 2016-12-14 NOTE — Telephone Encounter (Signed)
PLEASE NOTE: All timestamps contained within this report are represented as [place] Standard Time. CONFIDENTIALTY NOTICE: This fax transmission is intended only for the addressee. It contains information that is legally privileged, confidential or otherwise protected from use or disclosure. If you are not the intended recipient, you are strictly prohibited from reviewing, disclosing, copying using or disseminating any of this information or taking any action in reliance on or regarding this information. If you have received this fax in error, please notify us immediately by telephone so that we can arrange for its return to [place]. Phone: [phone], Toll-Free: [phone], Fax: [phone] Page: 1 of 1 Call Id: [id number] [name] - Client Nonclinical Telephone Record [name] - Client Client Site [name] Physician Ria Bush - MD Contact Type Call Who Is Calling Patient / Member / Family / Caregiver Caller Name Evan [name] Phone Number [phone] Reason for Call Symptomatic / Request for Health Information Initial Comment BS 339, no symptoms, refused service, just wants to make appt only, pls call back Call Closed By: Donato Heinz Transaction Date/Time: 12/14/2016 9:31:24 AM (ET)

## 2016-12-14 NOTE — Telephone Encounter (Signed)
I spoke with pt and lately FBS running in 200's one day several days ago ran in 400's and pt was irritable; today FBS 339; pt does not have any symptoms and has been taking prescribed meds as instructed. Offered pt appt at another [facility] site but pt wants to see PCP. Pt scheduled appt with Dr Darnell Level on 12/15/16 at 2:45. If pt condition worsens prior to appt pt will go to ED. FYI to Dr Darnell Level.

## 2016-12-15 ENCOUNTER — Encounter: Payer: Self-pay | Admitting: Family Medicine

## 2016-12-15 ENCOUNTER — Ambulatory Visit (INDEPENDENT_AMBULATORY_CARE_PROVIDER_SITE_OTHER): Payer: Federal, State, Local not specified - PPO | Admitting: Family Medicine

## 2016-12-15 VITALS — BP 142/84 | HR 86 | Temp 98.0°F | Wt 298.0 lb

## 2016-12-15 DIAGNOSIS — R1013 Epigastric pain: Secondary | ICD-10-CM

## 2016-12-15 DIAGNOSIS — IMO0001 Reserved for inherently not codable concepts without codable children: Secondary | ICD-10-CM

## 2016-12-15 DIAGNOSIS — Z6841 Body Mass Index (BMI) 40.0 and over, adult: Secondary | ICD-10-CM

## 2016-12-15 DIAGNOSIS — F331 Major depressive disorder, recurrent, moderate: Secondary | ICD-10-CM

## 2016-12-15 DIAGNOSIS — E1165 Type 2 diabetes mellitus with hyperglycemia: Secondary | ICD-10-CM | POA: Diagnosis not present

## 2016-12-15 LAB — COMPREHENSIVE METABOLIC PANEL
ALK PHOS: 61 U/L (ref 39–117)
ALT: 56 U/L — AB (ref 0–53)
AST: 50 U/L — AB (ref 0–37)
Albumin: 4.3 g/dL (ref 3.5–5.2)
BILIRUBIN TOTAL: 0.4 mg/dL (ref 0.2–1.2)
BUN: 15 mg/dL (ref 6–23)
CO2: 29 mEq/L (ref 19–32)
CREATININE: 0.98 mg/dL (ref 0.40–1.50)
Calcium: 9.7 mg/dL (ref 8.4–10.5)
Chloride: 97 mEq/L (ref 96–112)
GFR: 81.97 mL/min (ref >60.00)
GLUCOSE: 290 mg/dL — AB (ref 70–99)
Potassium: 3.9 mEq/L (ref 3.5–5.1)
SODIUM: 134 meq/L — AB (ref 135–145)
TOTAL PROTEIN: 7.5 g/dL (ref 6.0–8.3)

## 2016-12-15 LAB — POC URINALSYSI DIPSTICK (AUTOMATED)
Bilirubin, UA: NEGATIVE
Blood, UA: NEGATIVE
Leukocytes, UA: NEGATIVE
Nitrite, UA: NEGATIVE
Protein, UA: NEGATIVE
SPEC GRAV UA: 1.025 (ref 1.010–1.025)
Urobilinogen, UA: 0.2 E.U./dL
pH, UA: 6 (ref 5.0–8.0)

## 2016-12-15 LAB — HEMOGLOBIN A1C: Hgb A1c MFr Bld: 11.5 % — ABNORMAL HIGH (ref 4.6–6.5)

## 2016-12-15 LAB — VITAMIN B12: Vitamin B-12: 399 pg/mL (ref 211–911)

## 2016-12-15 MED ORDER — METFORMIN HCL 1000 MG PO TABS: 1000.0000 mg | ORAL_TABLET | Freq: Two times a day (BID) | ORAL | 3 refills | Status: DC

## 2016-12-15 MED ORDER — GLIMEPIRIDE 2 MG PO TABS
2.0000 mg | ORAL_TABLET | Freq: Every day | ORAL | 3 refills | Status: DC
Start: 2016-12-15 — End: 2016-12-26

## 2016-12-15 NOTE — Addendum Note (Signed)
Addended by: Lurlean Nanny on: 12/15/2016 04:25 PM   Modules accepted: Orders

## 2016-12-15 NOTE — Assessment & Plan Note (Signed)
Has weaned off sertraline.

## 2016-12-15 NOTE — Assessment & Plan Note (Signed)
Ongoing, mild, some improvement with PPI. He had unrevealing GI evaluation. Will check CT abd with contrast to further eval pancreas in setting of acute worsening of glycemic control.

## 2016-12-15 NOTE — Patient Instructions (Addendum)
Labs today. We will order CT abdomen We will refer you to diabetes classes We will refer you to [facility] to discuss possible continues glucose monitoring  Increase metformin to 1000mg  twice daily. Start amaryl 2mg  daily with breakfast.  Return in 1 month for follow up visit with sugar log 1 week prior.

## 2016-12-15 NOTE — Progress Notes (Signed)
BP (!) 142/84   Pulse 86   Temp 98 F (36.7 C) (Oral)   Wt 298 lb (135.2 kg)   SpO2 97%   BMI 40.42 kg/m    CC: hyperglycemia Subjective:    Patient ID: Evan [name], male    DOB: 03-05-1956, 61 y.o.   MRN: [id number]  HPI: UMAIR [name] is a 61 y.o. male presenting on 12/15/2016 for Diabetes (pt reports fasting glucose today was 339 denies any s/s)   DM - regularly does check sugars - 3-4 times daily, fasting today 339. Noticed over the past month - sugars trending up. Compliant with antihyperglycemic regimen which includes: metformin 850mg  bid. Last Thursday cbg 400s, felt bad. Today as well has felt bad. Generalized malaise. Possible mild toe paresthesias. Last diabetic eye exam DUE. Denies vision changes. Pneumovax: 2014.  Prevnar: not due yet. Denies diet changes, lifestyle changes, or recent stressors (actually stress better - since he's retired). Denies chest pain, dyspnea.  Lab Results  Component Value Date   HGBA1C 8.0 (H) 02/18/2016   Diabetic Foot Exam - Simple   Simple Foot Form Diabetic Foot exam was performed with the following findings:  Yes 12/15/2016  3:13 PM  Visual Inspection No deformities, no ulcerations, no other skin breakdown bilaterally:  Yes Sensation Testing Intact to touch and monofilament testing bilaterally:  Yes Pulse Check Posterior Tibialis and Dorsalis pulse intact bilaterally:  Yes Comments      Ongoing epigastric abdominal discomfort despite omeprazole daily - saw Dr [name] for this s/p abd [place], HIDA, EGD, colonoscopy.  He has fully tapered off sertraline.  Relevant past medical, surgical, family and social history reviewed and updated as indicated. Interim medical history since our last visit reviewed. Allergies and medications reviewed and updated. Outpatient Medications Prior to Visit  Medication Sig Dispense Refill  . aspirin 81 MG tablet Take 81 mg by mouth daily.      . Cholecalciferol (VITAMIN D3) 1000 units CAPS Take 1 capsule  (1,000 Units total) by mouth daily. 30 capsule   . Cyanocobalamin (VITAMIN B12) 3000 MCG SUBL Place 3,000 mcg under the tongue.    Marland Kitchen ibuprofen (ADVIL,MOTRIN) 200 MG tablet Take 200 mg by mouth at bedtime as needed.    Marland Kitchen losartan (COZAAR) 100 MG tablet Take 1 tablet (100 mg total) by mouth daily. 90 tablet 0  . Multiple Vitamins-Minerals (MULTIVITAMIN PO) Take 1 tablet by mouth daily.    Marland Kitchen omeprazole (PRILOSEC) 40 MG capsule TAKE ONE CAPSULE BY MOUTH EVERY DAY AS NEEDED 90 capsule 3  . ONE TOUCH ULTRA TEST test strip USE AS DIRECTED 100 each 0  . pravastatin (PRAVACHOL) 20 MG tablet TAKE 1 TABLET (20 MG TOTAL) BY MOUTH AT BEDTIME. 90 tablet 0  . sildenafil (VIAGRA) 100 MG tablet Take 0.5-1 tablets (50-100 mg total) by mouth as needed for erectile dysfunction. 10 tablet 1  . metFORMIN (GLUCOPHAGE) 850 MG tablet Take 1 tablet (850 mg total) by mouth 2 (two) times daily with a meal. 1850 tablet 1  . omeprazole (PRILOSEC) 40 MG capsule Take 1 capsule (40 mg total) by mouth daily.    Marland Kitchen topiramate (TOPAMAX) 50 MG tablet Take 1/2 tablet (25mg ) in AM, take 1 tablet (50mg ) in PM    . topiramate (TOPAMAX) 50 MG tablet Take one-half tablet in the morning and one tablet in the evening 135 tablet 1  . sertraline (ZOLOFT) 50 MG tablet TAKE 1 TABLET (50 MG TOTAL) BY MOUTH DAILY. (Patient not taking: Reported  on 12/15/2016) 90 tablet 1   No facility-administered medications prior to visit.      Per HPI unless specifically indicated in ROS section below Review of Systems     Objective:    BP (!) 142/84   Pulse 86   Temp 98 F (36.7 C) (Oral)   Wt 298 lb (135.2 kg)   SpO2 97%   BMI 40.42 kg/m   Wt Readings from Last 3 Encounters:  12/15/16 298 lb (135.2 kg)  04/01/16 (!) 302 lb 4 oz (137.1 kg)  03/09/16 300 lb (136.1 kg)    Physical Exam  Constitutional: He appears well-developed and well-nourished. No distress.  HENT:  Head: Normocephalic and atraumatic.  Right Ear: External ear normal.  Left  Ear: External ear normal.  Nose: Nose normal.  Mouth/Throat: Oropharynx is clear and moist. No oropharyngeal exudate.  Eyes: Conjunctivae and EOM are normal. Pupils are equal, round, and reactive to light. No scleral icterus.  Neck: Normal range of motion. Neck supple.  Cardiovascular: Normal rate, regular rhythm and intact distal pulses.   Murmur (3/6 systolic murmur) heard. Pulmonary/Chest: Effort normal and breath sounds normal. No respiratory distress. He has no wheezes. He has no rales.  Abdominal: Soft. Normal appearance and bowel sounds are normal. He exhibits no distension and no mass. There is tenderness in the epigastric area. There is no rigidity, no rebound, no guarding and negative Murphy's sign.  Musculoskeletal: He exhibits no edema.  See HPI for foot exam if done  Lymphadenopathy:    He has no cervical adenopathy.  Skin: Skin is warm and dry. No rash noted.  Psychiatric: He has a normal mood and affect.  Nursing note and vitals reviewed.  Results for orders placed or performed in visit on 02/18/16  Comprehensive metabolic panel  Result Value Ref Range   Sodium 137 135 - 145 mEq/L   Potassium 4.1 3.5 - 5.1 mEq/L   Chloride 102 96 - 112 mEq/L   CO2 27 19 - 32 mEq/L   Glucose, Bld 151 (H) 70 - 99 mg/dL   BUN 15 6 - 23 mg/dL   Creatinine, Ser 1.08 0.40 - 1.50 mg/dL   Total Bilirubin 0.4 0.2 - 1.2 mg/dL   Alkaline Phosphatase 47 39 - 117 U/L   AST 27 0 - 37 U/L   ALT 37 0 - 53 U/L   Total Protein 7.5 6.0 - 8.3 g/dL   Albumin 4.3 3.5 - 5.2 g/dL   Calcium 9.3 8.4 - 10.5 mg/dL   GFR 73.47 >60.00 mL/min  TSH  Result Value Ref Range   TSH 2.75 0.35 - 4.50 uIU/mL  Hemoglobin A1c  Result Value Ref Range   Hgb A1c MFr Bld 8.0 (H) 4.6 - 6.5 %  CBC with Differential/Platelet  Result Value Ref Range   WBC 9.4 4.0 - 10.5 K/uL   RBC 4.69 4.22 - 5.81 Mil/uL   Hemoglobin 15.2 13.0 - 17.0 g/dL   HCT 44.5 39.0 - 52.0 %   MCV 94.7 78.0 - 100.0 fl   MCHC 34.2 30.0 - 36.0 g/dL     RDW 13.9 11.5 - 15.5 %   Platelets 301.0 150.0 - 400.0 K/uL   Neutrophils Relative % 64.3 43.0 - 77.0 %   Lymphocytes Relative 23.9 12.0 - 46.0 %   Monocytes Relative 9.7 3.0 - 12.0 %   Eosinophils Relative 1.7 0.0 - 5.0 %   Basophils Relative 0.4 0.0 - 3.0 %   Neutro Abs 6.0 1.4 - 7.7  K/uL   Lymphs Abs 2.2 0.7 - 4.0 K/uL   Monocytes Absolute 0.9 0.1 - 1.0 K/uL   Eosinophils Absolute 0.2 0.0 - 0.7 K/uL   Basophils Absolute 0.0 0.0 - 0.1 K/uL  Vitamin B12  Result Value Ref Range   Vitamin B-12 315 211 - 911 pg/mL  VITAMIN D 25 Hydroxy (Vit-D Deficiency, Fractures)  Result Value Ref Range   VITD 26.51 (L) 30.00 - 100.00 ng/mL  Lipase  Result Value Ref Range   Lipase 25.0 11.0 - 59.0 U/L      Assessment & Plan:   Problem List Items Addressed This Visit    Diabetes mellitus type 2, uncontrolled ([place]) - Primary    Chronic, marked deterioration of glycemic control of unclear etiology. Discussed possible natural progression of diabetes with pancreas burning out. Patient declines starting insulin at this time.  Will refer to [facility] to discuss possible continuous glucose monitoring. Will refer to endo for evaluation. Will increase metformin to max dose, add glimepiride 2mg  daily with breakfast. RTC 1 mo f/u visit with 1 wk sugar log prior.       Relevant Medications   metFORMIN (GLUCOPHAGE) 1000 MG tablet   glimepiride (AMARYL) 2 MG tablet   Other Relevant Orders   Ambulatory referral to Endocrinology   CT ABDOMEN W CONTRAST   Comprehensive metabolic panel   Hemoglobin A1c   Vitamin B12   Ambulatory referral to diabetic education   Epigastric abdominal pain    Ongoing, mild, some improvement with PPI. He had unrevealing GI evaluation. Will check CT abd with contrast to further eval pancreas in setting of acute worsening of glycemic control.      Relevant Orders   CT ABDOMEN W CONTRAST   MDD (major depressive disorder), recurrent episode, moderate ([place])    Has weaned off  sertraline.       Morbid obesity with BMI of 40.0-44.9, adult (HCC)   Relevant Medications   metFORMIN (GLUCOPHAGE) 1000 MG tablet   glimepiride (AMARYL) 2 MG tablet       Follow up plan: Return in about 1 month (around 01/15/2017) for follow up visit.  Ria Bush, MD

## 2016-12-15 NOTE — Telephone Encounter (Signed)
Will see today.  

## 2016-12-15 NOTE — Assessment & Plan Note (Signed)
Chronic, marked deterioration of glycemic control of unclear etiology. Discussed possible natural progression of diabetes with pancreas burning out. Patient declines starting insulin at this time.  Will refer to [facility] to discuss possible continuous glucose monitoring. Will refer to endo for evaluation. Will increase metformin to max dose, add glimepiride 2mg  daily with breakfast. RTC 1 mo f/u visit with 1 wk sugar log prior.

## 2016-12-18 ENCOUNTER — Ambulatory Visit (INDEPENDENT_AMBULATORY_CARE_PROVIDER_SITE_OTHER)
Admission: RE | Admit: 2016-12-18 | Discharge: 2016-12-18 | Disposition: A | Discharge status: Home / Self Care | Payer: Federal, State, Local not specified - PPO | Source: Ambulatory Visit | Attending: Family Medicine | Admitting: Family Medicine

## 2016-12-18 DIAGNOSIS — IMO0001 Reserved for inherently not codable concepts without codable children: Secondary | ICD-10-CM

## 2016-12-18 DIAGNOSIS — R1013 Epigastric pain: Secondary | ICD-10-CM | POA: Diagnosis not present

## 2016-12-18 DIAGNOSIS — E1165 Type 2 diabetes mellitus with hyperglycemia: Secondary | ICD-10-CM

## 2016-12-18 MED ORDER — IOPAMIDOL (ISOVUE-300) INJECTION 61%
100.0000 mL | Freq: Once | INTRAVENOUS | Status: AC | PRN
  Administered 2016-12-18: 100 mL via INTRAVENOUS

## 2016-12-19 ENCOUNTER — Encounter: Payer: Self-pay | Admitting: Family Medicine

## 2016-12-19 DIAGNOSIS — I700 Atherosclerosis of aorta: Secondary | ICD-10-CM | POA: Insufficient documentation

## 2016-12-19 DIAGNOSIS — K760 Fatty (change of) liver, not elsewhere classified: Secondary | ICD-10-CM | POA: Insufficient documentation

## 2016-12-25 ENCOUNTER — Encounter: Payer: Self-pay | Admitting: Family Medicine

## 2016-12-26 MED ORDER — GLIMEPIRIDE 4 MG PO TABS: 4.0000 mg | ORAL_TABLET | Freq: Every day | ORAL | 3 refills | Status: DC

## 2016-12-26 MED ORDER — SITAGLIPTIN PHOSPHATE 50 MG PO TABS: 50.0000 mg | ORAL_TABLET | Freq: Every day | ORAL | 3 refills | Status: DC

## 2017-01-01 ENCOUNTER — Other Ambulatory Visit: Payer: Self-pay | Admitting: Family Medicine

## 2017-01-01 NOTE — Telephone Encounter (Signed)
Last filled 06/26/16.Marland KitchenMarland Kitchenplease advise

## 2017-01-08 ENCOUNTER — Other Ambulatory Visit: Payer: Self-pay | Admitting: Family Medicine

## 2017-01-11 ENCOUNTER — Telehealth: Payer: Self-pay | Admitting: *Deleted

## 2017-01-11 MED ORDER — GLUCOSE BLOOD VI STRP: 1.0000 | ORAL_STRIP | 11 refills | Status: DC

## 2017-01-11 NOTE — Telephone Encounter (Signed)
Pharmacist left a voicemail stating that they received a script for patient's test strips that shows use as directed. Pharmacist stated that a new script needs to be sent in with specific directions per regulations.

## 2017-01-11 NOTE — Telephone Encounter (Signed)
Set in clarification.

## 2017-01-26 ENCOUNTER — Other Ambulatory Visit: Payer: Self-pay | Admitting: Family Medicine

## 2017-01-27 ENCOUNTER — Other Ambulatory Visit: Payer: Self-pay

## 2017-01-27 ENCOUNTER — Ambulatory Visit (INDEPENDENT_AMBULATORY_CARE_PROVIDER_SITE_OTHER): Payer: Federal, State, Local not specified - PPO | Admitting: Endocrinology

## 2017-01-27 ENCOUNTER — Telehealth: Payer: Self-pay | Admitting: Endocrinology

## 2017-01-27 ENCOUNTER — Encounter: Payer: Self-pay | Admitting: Endocrinology

## 2017-01-27 VITALS — BP 134/82 | HR 82 | Ht 70.75 in | Wt 293.2 lb

## 2017-01-27 DIAGNOSIS — E1165 Type 2 diabetes mellitus with hyperglycemia: Secondary | ICD-10-CM | POA: Diagnosis not present

## 2017-01-27 MED ORDER — ONETOUCH VERIO FLEX SYSTEM W/DEVICE KIT: 1.0000 | PACK | Freq: Every day | 2 refills | Status: DC

## 2017-01-27 MED ORDER — GLUCOSE BLOOD VI STRP: ORAL_STRIP | 3 refills | Status: DC

## 2017-01-27 MED ORDER — DAPAGLIFLOZIN PROPANEDIOL 5 MG PO TABS: 5.0000 mg | ORAL_TABLET | Freq: Every day | ORAL | 2 refills | Status: DC

## 2017-01-27 NOTE — Telephone Encounter (Signed)
Called patient to let him know that Wilder Glade was not covered under his insurance and requested he call his insurance company to find out what is covered. Once he call me back I will give that information to Dr. Dwyane Dee for him to determine what medication to try next

## 2017-01-27 NOTE — Telephone Encounter (Signed)
Called and spoke to [place] at the pharmacy and she stated that these strips will not be ready to be filled until August 18th per patient insurance company. Called patient and left a voice message to let him know that he should be able to pick up his OneTouch Verio test strips on or after August 18th per patient insurance.

## 2017-01-27 NOTE — Telephone Encounter (Signed)
Pharmacy called in reference to Rx saying it is "too early" to fill per insurance. glucose blood (ONETOUCH VERIO) test strip. Please contact pharmacy and advise.

## 2017-01-27 NOTE — Patient Instructions (Addendum)
Glimeperide 1/2 at dinner  Continue metformin and Januvia  With starting FARXIGA reduce losartan to half a tablet  Check blood sugars on waking up  every 2-3 days  Also check blood sugars about 2 hours after a meal and do this after different meals by rotation  Recommended blood sugar levels on waking up is 90-130 and about 2 hours after meal is 130-160  Please bring your blood sugar monitor to each visit, thank you

## 2017-01-27 NOTE — Progress Notes (Signed)
Patient ID: Evan [name], male   DOB: 1955/08/20, 61 y.o.   MRN: [id number]          Reason for Appointment: Consultation for Type 2 Diabetes  Referring physician: Danise Mina   History of Present Illness:          Date of diagnosis of type 2 diabetes mellitus: 2012        Background history:   Diagnosed incidentally with a high blood sugar Has been on metformin since diagnosis and subsequently Amaryl had been added He has had sporadic follow-up for the last few years His A1c had been below 7 to about 08/2015 Subsequently in 9/17 his A1c had gone up to 8%  Recent history:      Non-insulin hypoglycemic drugs the patient is taking are: Januvia 50 mg daily, Amaryl 4 mg in the morning, metformin 1 g twice a day  Last A1c is 11.5 done in July  Current management, blood sugar patterns and problems identified:  The patient says that his blood sugars started going up in April and at one time were over 400  With this he started improving his diet with cutting back on some portions, high-fat meals and snacks  However his blood sugars was still mostly over 200 when he was seen by his PCP in July  Januvia 50 mg daily was added.  He thinks this has helped his blood sugars somewhat  He still tends to have higher fat meals or snacks and will get hungry between meals or after supper     His highest blood sugars are after supper and are still relatively high in the morning  He has lost about 5 pounds since last month and also is doing better compared to last year        Side effects from medications have been: None  Compliance with the medical regimen: Improving Hypoglycemia:   none  Glucose monitoring:  done 1-2 times a day         Glucometer: One Touch.      Blood Glucose readings by recall:   PREMEAL Breakfast Lunch Dinner Bedtime  Overall   Glucose range: 150-175  150+  100-150  180-270   Median:        Self-care: The diet that the patient has been following is: tries to  limit [employer], Carbohydrates and avoiding drinks with sugar .      Typical meal intake: Breakfast is usually Eggs and cheese.  Lunch is usually [place] yogurt.  Dinner: meat and vegetables    His snacks are fruits like apples and nuts             Dietician visit, most recent: 09/2013               Exercise:  walking throughout the day  Weight history:  Wt Readings from Last 3 Encounters:  01/27/17 293 lb 3.2 oz (133 kg)  12/15/16 298 lb (135.2 kg)  04/01/16 (!) 302 lb 4 oz (137.1 kg)    Glycemic control:   Lab Results  Component Value Date   HGBA1C 11.5 (H) 12/15/2016   HGBA1C 8.0 (H) 02/18/2016   HGBA1C 6.7 (H) 09/10/2015   Lab Results  Component Value Date   MICROALBUR <0.7 10/29/2014   LDLCALC 92 09/10/2015   CREATININE 0.98 12/15/2016   Lab Results  Component Value Date   MICRALBCREAT 0.5 10/29/2014    No results found for: FRUCTOSAMINE    Allergies as of 01/27/2017  Reactions   Penicillins    Respiratory distress as infant      Medication List       Accurate as of 01/27/17 12:00 PM. Always use your most recent med list.          aspirin 81 MG tablet Take 81 mg by mouth daily.   glimepiride 4 MG tablet Commonly known as:  AMARYL Take 1 tablet (4 mg total) by mouth daily before breakfast.   glucose blood test strip Commonly known as:  ONE TOUCH ULTRA TEST 1 each by Other route See admin instructions. TID and PRN E11.65   ibuprofen 200 MG tablet Commonly known as:  ADVIL,MOTRIN Take 200 mg by mouth at bedtime as needed.   losartan 100 MG tablet Commonly known as:  COZAAR TAKE 1 TABLET BY MOUTH EVERY DAY   metFORMIN 1000 MG tablet Commonly known as:  GLUCOPHAGE Take 1 tablet (1,000 mg total) by mouth 2 (two) times daily with a meal.   MULTIVITAMIN PO Take 1 tablet by mouth daily.   omeprazole 40 MG capsule Commonly known as:  PRILOSEC TAKE ONE CAPSULE BY MOUTH EVERY DAY AS NEEDED   pravastatin 20 MG tablet Commonly known as:   PRAVACHOL TAKE 1 TABLET (20 MG TOTAL) BY MOUTH AT BEDTIME.   sildenafil 100 MG tablet Commonly known as:  VIAGRA Take 0.5-1 tablets (50-100 mg total) by mouth as needed for erectile dysfunction.   sitaGLIPtin 50 MG tablet Commonly known as:  JANUVIA Take 1 tablet (50 mg total) by mouth daily.   topiramate 50 MG tablet Commonly known as:  TOPAMAX TAKE 1/2 TABLET BY MOUTH IN THE MORING AND TAKE 1 TABET BY MOUTH IN THE EVENING   Vitamin B12 3000 MCG Subl Place 3,000 mcg under the tongue.   Vitamin D3 1000 units Caps Take 1 capsule (1,000 Units total) by mouth daily.       Allergies:  Allergies  Allergen Reactions  . Penicillins     Respiratory distress as infant    Past Medical History:  Diagnosis Date  . Depression    intolerant of cymbalta and wellbutrin  . Diverticulosis of colon   . Esophagitis 03/2015   by EGD  . Gastritis 03/2015   by EGD - mild, chronic  . Hyperlipidemia   . Hypertension   . Lichen simplex chronicus 05/2015   R scrotum [name])  . Murmur    aortic sclerosis, SBE  prophlaxis not needed as per cardio  . NAFLD (nonalcoholic fatty liver disease) 01/2015   by [place]  . 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 T2DM (type 2 diabetes mellitus) ([place])    DSME 09/2013    Past Surgical History:  Procedure Laterality Date  . COLONOSCOPY  2006   diverticulosis per pt  . COLONOSCOPY  03/2015   2 polyps, rpt 5 yrs [name])  . ESOPHAGOGASTRODUODENOSCOPY  03/2015   mild chronic gastritis, esophagitis   . LUMBAR LAMINECTOMY  1989   L5-S1    Family History  Problem Relation Age of Onset  . Alzheimer's disease Father   . Hypertension Father   . Stroke Father   . Hyperlipidemia Father   . Coronary artery disease Brother 69       diabetic, MI  . Prostate cancer Brother   . Diabetes Brother   . Coronary artery disease Maternal Uncle   . Schizophrenia Paternal Uncle   . Emphysema Mother     Social History:  reports that he  has  never smoked. He has never used smokeless tobacco. He reports that he drinks alcohol. He reports that he does not use drugs.   Review of Systems  Constitutional: Negative for reduced appetite.  HENT: Negative for trouble swallowing.   Eyes: Negative for blurred vision.  Respiratory: Negative for shortness of breath.   Cardiovascular: Negative for chest pain and leg swelling.  Gastrointestinal: Negative for diarrhea and abdominal pain.  Endocrine: Negative for fatigue and polydipsia.  Genitourinary: Negative for frequency.  Musculoskeletal: Positive for back pain.  Neurological:       For some time has had periods of feeling weak, some difficulty with speech and feeling disconnected.  This lasts for a few minutes or a few hours.  Treated by neurologist with Topamax but not better with this  Psychiatric/Behavioral: Positive for depressed mood.    Lipid history: On pravastatin for a few years    Lab Results  Component Value Date   CHOL 192 09/10/2015   HDL 72.40 09/10/2015   LDLCALC 92 09/10/2015   LDLDIRECT 99.0 05/20/2015   TRIG 140.0 09/10/2015   CHOLHDL 3 09/10/2015           Hypertension: On LosartanFor treatment, has been diagnosed about 10 years ago  Most recent eye exam was 2016  Most recent foot exam: 8/18    LABS:  No visits with results within 1 Week(s) from this visit.  Latest known visit with results is:  Office Visit on 12/15/2016  Component Date Value Ref Range Status  . Sodium 12/15/2016 134* 135 - 145 mEq/L Final  . Potassium 12/15/2016 3.9  3.5 - 5.1 mEq/L Final  . Chloride 12/15/2016 97  96 - 112 mEq/L Final  . CO2 12/15/2016 29  19 - 32 mEq/L Final  . Glucose, Bld 12/15/2016 290* 70 - 99 mg/dL Final  . BUN 12/15/2016 15  6 - 23 mg/dL Final  . Creatinine, Ser 12/15/2016 0.98  0.40 - 1.50 mg/dL Final  . Total Bilirubin 12/15/2016 0.4  0.2 - 1.2 mg/dL Final  . Alkaline Phosphatase 12/15/2016 61  39 - 117 U/L Final  . AST 12/15/2016 50* 0 - 37 U/L  Final  . ALT 12/15/2016 56* 0 - 53 U/L Final  . Total Protein 12/15/2016 7.5  6.0 - 8.3 g/dL Final  . Albumin 12/15/2016 4.3  3.5 - 5.2 g/dL Final  . Calcium 12/15/2016 9.7  8.4 - 10.5 mg/dL Final  . GFR 12/15/2016 81.97  >60.00 mL/min Final  . Hgb A1c MFr Bld 12/15/2016 11.5* 4.6 - 6.5 % Final   Glycemic Control Guidelines for People with Diabetes:Non Diabetic:  <6%Goal of Therapy: <7%Additional Action Suggested:  >8%   . Vitamin B-12 12/15/2016 399  211 - 911 pg/mL Final  . Glucose, UA 12/15/2016 ++   Final  . Bilirubin, UA 12/15/2016 neg   Final  . Ketones, UA 12/15/2016 +   Final  . Spec Grav, UA 12/15/2016 1.025  1.010 - 1.025 Final  . Blood, UA 12/15/2016 neg   Final  . pH, UA 12/15/2016 6.0  5.0 - 8.0 Final  . Protein, UA 12/15/2016 neg   Final  . Urobilinogen, UA 12/15/2016 0.2  0.2 or 1.0 E.U./dL Final  . Nitrite, UA 12/15/2016 neg   Final  . Leukocytes, UA 12/15/2016 Negative  Negative Final    Physical Examination:  BP 134/82   Pulse 82   Ht 5' 10.75" (1.797 m)   Wt 293 lb 3.2 oz (133 kg)   SpO2  96%   BMI 41.18 kg/m   GENERAL:         Patient has generalized obesity.   HEENT:         Eye exam shows normal external appearance.  Fundus exam shows no retinopathy. Oral exam shows normal mucosa .  NECK:   There is no lymphadenopathy Thyroid is not enlarged and no nodules felt.  Carotids are normal to palpation and Left low pitched bruit heard LUNGS:         Chest is symmetrical. Lungs are clear to auscultation.Marland Kitchen   HEART:         Heart sounds:  S1 and S2 are normal. No murmur or click heard., no S3 or S4.   ABDOMEN:   There is no distention present. Liver and spleen are not palpable. No other mass or tenderness present.   NEUROLOGICAL:   Ankle jerks are absent bilaterally.    Diabetic Foot Exam - Simple   Simple Foot Form Diabetic Foot exam was performed with the following findings:  Yes 01/27/2017 11:44 AM  Visual Inspection No deformities, no ulcerations, no other  skin breakdown bilaterally:  Yes Sensation Testing Intact to touch and monofilament testing bilaterally:  Yes Pulse Check Posterior Tibialis and Dorsalis pulse intact bilaterally:  Yes Comments            Vibration sense is Moderately reduced in distal first toes. MUSCULOSKELETAL:  There is no swelling or deformity of the peripheral joints. Spine is normal to inspection.   EXTREMITIES:     There is no edema. No skin lesions present.Marland Kitchen SKIN:       No rash or lesions of concern.        ASSESSMENT:  Diabetes type 2, uncontrolled    Most recent A1c was 11.5 Currently on a regimen of Amaryl, metformin and low-dose Januvia  Although he has improved his diet this year he is still significantly obese with a BMI of 41 Currently trying to follow a relatively low carbohydrate diet Tends to have relatively higher post prandial readings in the evening He is also trying to walk for exercise Discussed that he likely has some progression of his diabetes with increasing insulin deficiency not controlled with current regimen  Complications of diabetes: None evident, needs follow-up eye exam  HYPERTENSION: Well controlled with losartan  Mild hyperlipidemia, controlled with pravastatin  PLAN:     Although patient only would do better with a GLP-1 drug he refuses to consider any injectable medications today  He is a good candidate for an SGLT 2 drugs for his diabetes control Discussed action of SGLT 2 drugs on lowering glucose by decreasing kidney absorption of glucose, benefits of weight loss and lower blood pressure, possible side effects including candidiasis and dosage regimen   He will start with 5 mg of Farxiga in the morning  Since his blood sugars and to be relatively lower in the afternoon he will switch his Amaryl to dinnertime and reduced the dose to half a tablet for now  Also at the same time he will cut his losartan in half  Most likely will need to increase his Januvia dose to  100 mg or use a combination tablet with Wilder Glade for reduced cost  He will try to check his blood sugars consistently after meals  He needs to bring his monitor for download on the next visit  Patient prefers a meter that can be linked to his phone and given him a One Touch Verio flex monitor  Consultation with dietitian  Follow-up in 1 month to reassess his level of control and adjust his medications accordingly  Encouraged him to continue regular exercise  Patient Instructions  Glimeperide 1/2 at dinner  Continue metformin and Januvia  With starting FARXIGA reduce losartan to half a tablet  Check blood sugars on waking up  every 2-3 days  Also check blood sugars about 2 hours after a meal and do this after different meals by rotation  Recommended blood sugar levels on waking up is 90-130 and about 2 hours after meal is 130-160  Please bring your blood sugar monitor to each visit, thank you     Consultation note has been sent to the referring physician  [facility] 01/27/2017, 12:00 PM   Note: This office note was prepared with Dragon voice recognition system technology. Any transcriptional errors that result from this process are unintentional.  Counseling time on subjects discussed in assessment and plan sections is over 50% of today's 60 minute visit

## 2017-01-28 ENCOUNTER — Telehealth: Payer: Self-pay | Admitting: Endocrinology

## 2017-01-28 ENCOUNTER — Other Ambulatory Visit: Payer: Self-pay

## 2017-01-28 MED ORDER — DAPAGLIFLOZIN PROPANEDIOL 5 MG PO TABS: 5.0000 mg | ORAL_TABLET | Freq: Every day | ORAL | 2 refills | Status: DC

## 2017-01-28 NOTE — Telephone Encounter (Signed)
I have sent this order through electronic prescription order to [facility] for the [place].

## 2017-01-28 NOTE — Telephone Encounter (Signed)
[employer] wants the dapagliflozin propanediol (FARXIGA) 5 MG TABS tablet to be filled through [facility]. Call [facility] at [phone] (phone). If needed the fax number for [employer] is [phone].  Please advise so that patient can get medication.

## 2017-01-28 NOTE — Telephone Encounter (Signed)
Called patient to verify and confirm that I could send prescription to the [facility].

## 2017-02-02 ENCOUNTER — Telehealth: Payer: Self-pay | Admitting: Family Medicine

## 2017-02-02 NOTE — Telephone Encounter (Signed)
Patient Name: Evan [name]  DOB: [birth year]    Initial Comment Caller states he has had diarrhea since the weekend. He is diabetic and is concerned about it being continuous. Blood sugar this morning is 159.   Nurse Assessment  Nurse: Raphael Gibney, RN, Vanita Ingles Date/Time Eilene Ghazi Time): 02/02/2017 10:25:24 AM  Confirm and document reason for call. If symptomatic, describe symptoms. ---Caller states he has been having diarrhea since Saturday. Has had at least 6-7 or more diarrhea stools daily. He is urinating but not as much as usual. Blood sugar 159 this am. No fever. Not eating much.  Does the patient have any new or worsening symptoms? ---Yes  Will a triage be completed? ---Yes  Related visit to physician within the last 2 weeks? ---No  Does the PT have any chronic conditions? (i.e. diabetes, asthma, etc.) ---Yes  List chronic conditions. ---diabetes  Is this a behavioral health or substance abuse call? ---No     Guidelines    Guideline Title Affirmed Question Affirmed Notes  Diarrhea [1] SEVERE diarrhea (e.g., 7 or more times / day more than normal) AND [2] age > 60 years    Final Disposition User   See Physician within 4 Hours (or PCP triage) Raphael Gibney, RN, Vera    Comments  No appts available with in 4 hrs. Pt does not want to go to another office or urgent care. Please call pt back regarding any available appt today or tomorrow.   Referrals  GO TO FACILITY REFUSED   Disagree/Comply: Disagree  Disagree/Comply Reason: Disagree with instructions

## 2017-02-02 NOTE — Telephone Encounter (Signed)
Called and spoke to patient and he declined to go to another office today to be seen. . Patient stated that he had symptoms last week and they went away. Patient stated that he started back with diarrhea Saturday. Patient stated that he is having diarrhea, but no fever. Appointment scheduled with Webb Silversmith NP tomorrow 02/03/2017 at 2:00. Advised patient if symptoms get worse before the appointment to go to the ER and he agreed.

## 2017-02-02 NOTE — Telephone Encounter (Signed)
noted 

## 2017-02-03 ENCOUNTER — Encounter: Payer: Self-pay | Admitting: Endocrinology

## 2017-02-03 ENCOUNTER — Encounter: Payer: Self-pay | Admitting: Internal Medicine

## 2017-02-03 ENCOUNTER — Ambulatory Visit (INDEPENDENT_AMBULATORY_CARE_PROVIDER_SITE_OTHER): Payer: Federal, State, Local not specified - PPO | Admitting: Internal Medicine

## 2017-02-03 VITALS — BP 124/80 | HR 68 | Temp 97.9°F | Wt 295.0 lb

## 2017-02-03 DIAGNOSIS — R197 Diarrhea, unspecified: Secondary | ICD-10-CM | POA: Diagnosis not present

## 2017-02-03 NOTE — Progress Notes (Signed)
Subjective:    Patient ID: Evan [name], male    DOB: March 02, 1956, 61 y.o.   MRN: [id number]  HPI  Pt presents to the clinic today with c/o diarrhea. He reports this started 5 days ago. He is having 6-7 watery stools per day although he reports he has not had any loose stools in almost 24 hours now. He denies abdominal pain, nausea, vomiting or blood in his stools. He has taken Mylanta without any relief. He had similar symptoms 1 week ago, but this resolved on his own. Colonoscopy from 03/2015 reviewed. He denies changes in diet but reports his Metformin was increased 5-6 weeks ago to 1000 mg BID. He denies recent travel, or antibiotic use.  Review of Systems      Past Medical History:  Diagnosis Date  . Depression    intolerant of cymbalta and wellbutrin  . Diverticulosis of colon   . Esophagitis 03/2015   by EGD  . Gastritis 03/2015   by EGD - mild, chronic  . Hyperlipidemia   . Hypertension   . Lichen simplex chronicus 05/2015   R scrotum [name])  . Murmur    aortic sclerosis, SBE  prophlaxis not needed as per cardio  . NAFLD (nonalcoholic fatty liver disease) 01/2015   by [place]  . 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 T2DM (type 2 diabetes mellitus) ([place])    DSME 09/2013    Current Outpatient Prescriptions  Medication Sig Dispense Refill  . aspirin 81 MG tablet Take 81 mg by mouth daily.      . Blood Glucose Monitoring Suppl (ONETOUCH VERIO FLEX SYSTEM) w/Device KIT 1 kit by Does not apply route daily. Use to check blood sugars 2 times daily 1 kit 2  . Cholecalciferol (VITAMIN D3) 1000 units CAPS Take 1 capsule (1,000 Units total) by mouth daily. 30 capsule   . Cyanocobalamin (VITAMIN B12) 3000 MCG SUBL Place 3,000 mcg under the tongue.    . dapagliflozin propanediol (FARXIGA) 5 MG TABS tablet Take 5 mg by mouth daily. 30 tablet 2  . glimepiride (AMARYL) 4 MG tablet Take 1 tablet (4 mg total) by mouth daily before breakfast. 30 tablet 3  . glucose  blood (ONE TOUCH ULTRA TEST) test strip 1 each by Other route See admin instructions. TID and PRN E11.65 100 each 11  . glucose blood (ONETOUCH VERIO) test strip Use to check blood sugars 2 times daily 200 each 3  . ibuprofen (ADVIL,MOTRIN) 200 MG tablet Take 200 mg by mouth at bedtime as needed.    Marland Kitchen losartan (COZAAR) 100 MG tablet TAKE 1 TABLET BY MOUTH EVERY DAY 90 tablet 0  . metFORMIN (GLUCOPHAGE) 1000 MG tablet Take 1 tablet (1,000 mg total) by mouth 2 (two) times daily with a meal. 180 tablet 3  . Multiple Vitamins-Minerals (MULTIVITAMIN PO) Take 1 tablet by mouth daily.    Marland Kitchen omeprazole (PRILOSEC) 40 MG capsule TAKE ONE CAPSULE BY MOUTH EVERY DAY AS NEEDED 90 capsule 3  . pravastatin (PRAVACHOL) 20 MG tablet TAKE 1 TABLET (20 MG TOTAL) BY MOUTH AT BEDTIME. 90 tablet 0  . sildenafil (VIAGRA) 100 MG tablet Take 0.5-1 tablets (50-100 mg total) by mouth as needed for erectile dysfunction. 10 tablet 1  . sitaGLIPtin (JANUVIA) 50 MG tablet Take 1 tablet (50 mg total) by mouth daily. 30 tablet 3  . topiramate (TOPAMAX) 50 MG tablet TAKE 1/2 TABLET BY MOUTH IN THE MORING AND TAKE 1 TABET BY MOUTH  IN THE EVENING 135 tablet 1   No current facility-administered medications for this visit.     Allergies  Allergen Reactions  . Penicillins     Respiratory distress as infant    Family History  Problem Relation Age of Onset  . Alzheimer's disease Father   . Hypertension Father   . Stroke Father   . Hyperlipidemia Father   . Coronary artery disease Brother 43       diabetic, MI  . Prostate cancer Brother   . Diabetes Brother   . Coronary artery disease Maternal Uncle   . Schizophrenia Paternal Uncle   . Emphysema Mother     Social History   Social History  . Marital status: Married    Spouse name: N/A  . Number of children: N/A  . Years of education: N/A   Occupational History  . Not on file.   Social History Main Topics  . Smoking status: Never Smoker  . Smokeless tobacco:  Never Used  . Alcohol use 0.0 oz/week     Comment: occasional  . Drug use: No  . Sexual activity: Not on file   Other Topics Concern  . 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  Keeps record of caloric intake     Constitutional: Denies fever, malaise, fatigue, headache or abrupt weight changes.   Gastrointestinal: Pt reports diarrhea. Denies abdominal pain, bloating, constipation, or blood in the stool.    No other specific complaints in a complete review of systems (except as listed in HPI above).  Objective:   Physical Exam   BP 124/80   Pulse 68   Temp 97.9 F (36.6 C) (Oral)   Wt 295 lb (133.8 kg)   SpO2 96%   BMI 41.44 kg/m  Wt Readings from Last 3 Encounters:  02/03/17 295 lb (133.8 kg)  01/27/17 293 lb 3.2 oz (133 kg)  12/15/16 298 lb (135.2 kg)    General: Appears his stated age, obese in NAD. Abdomen: Soft and generally ender. Hyperactive bowel sounds. No distention or masses noted.  Neurological: Alert and oriented.    BMET    Component Value Date/Time   NA 134 (L) 12/15/2016 1522   K 3.9 12/15/2016 1522   CL 97 12/15/2016 1522   CO2 29 12/15/2016 1522   GLUCOSE 290 (H) 12/15/2016 1522   BUN 15 12/15/2016 1522   CREATININE 0.98 12/15/2016 1522   CALCIUM 9.7 12/15/2016 1522   GFRNONAA >60 07/28/2007 0555   GFRAA  07/28/2007 0555    >60        The eGFR has been calculated using the MDRD equation. This calculation has not been validated in all clinical    Lipid Panel     Component Value Date/Time   CHOL 192 09/10/2015 0833   TRIG 140.0 09/10/2015 0833   HDL 72.40 09/10/2015 0833   CHOLHDL 3 09/10/2015 0833   VLDL 28.0 09/10/2015 0833   LDLCALC 92 09/10/2015 0833    CBC    Component Value  Date/Time   WBC 9.4 02/18/2016 1254   RBC 4.69 02/18/2016 1254   HGB 15.2 02/18/2016 1254   HCT 44.5 02/18/2016 1254   PLT 301.0 02/18/2016 1254   MCV 94.7 02/18/2016  1254   MCHC 34.2 02/18/2016 1254   RDW 13.9 02/18/2016 1254   LYMPHSABS 2.2 02/18/2016 1254   MONOABS 0.9 02/18/2016 1254   EOSABS 0.2 02/18/2016 1254   BASOSABS 0.0 02/18/2016 1254    Hgb A1C Lab Results  Component Value Date   HGBA1C 11.5 (H) 12/15/2016           Assessment & Plan:   Diarrhea:  Does not sound infection ? R/t recent increase in Metformin Advised him to go back downto 850 mg BID, if diarrhea resolves, we will know this is the cause Instructed him to consume plenty of fluids, consume a bland diet and monitor sugars He declines CBC and CMET today  Return precautions discussed Webb Silversmith, NP

## 2017-02-03 NOTE — Patient Instructions (Signed)

## 2017-02-04 ENCOUNTER — Telehealth: Payer: Self-pay | Admitting: Endocrinology

## 2017-02-04 NOTE — Telephone Encounter (Signed)
Patient called in reference to e-mail  sent via [facility] chart regarding status of Rx for dapagliflozin propanediol (FARXIGA) 5 MG TABS tablet. Please call patient and advise.

## 2017-02-04 NOTE — Telephone Encounter (Signed)
Called [facility] and spoke to [place] and she stated that the Wilder Glade has been approved and that it should be processed today for shipment. Sent me to Juanda Crumble and he is with the [employer] and he stated that it looks like the patient has picked up the [place] from a different pharmacy and that he may not be able to get this until approved from [facility] once they look into the amount of days last prescription has been filled.

## 2017-02-04 NOTE — Telephone Encounter (Signed)
Patient called to see if the problem may be that the date on the website for when the medication was prescribed was 9/15? Call to advise.

## 2017-02-04 NOTE — Telephone Encounter (Signed)
Called [facility] again and spoke to [place] and let her know that the patient has not picked this medication up anywhere else and this is the first time he has been prescribed Farxiga.

## 2017-02-04 NOTE — Telephone Encounter (Signed)
Routing to you °

## 2017-02-04 NOTE — Telephone Encounter (Signed)
Called patient and left a voice message to let him know that we will look into this and call him back.  Do you know anything about this one? Could you look into?   Thank you!

## 2017-02-08 NOTE — Telephone Encounter (Signed)
The PA for Wilder Glade has been approved from 01/05/17 through 02/04/18

## 2017-02-18 ENCOUNTER — Encounter: Payer: Federal, State, Local not specified - PPO | Attending: Family Medicine | Admitting: Dietician

## 2017-02-18 ENCOUNTER — Encounter: Payer: Self-pay | Admitting: Dietician

## 2017-02-18 DIAGNOSIS — Z6841 Body Mass Index (BMI) 40.0 and over, adult: Secondary | ICD-10-CM

## 2017-02-18 DIAGNOSIS — IMO0001 Reserved for inherently not codable concepts without codable children: Secondary | ICD-10-CM

## 2017-02-18 DIAGNOSIS — Z713 Dietary counseling and surveillance: Secondary | ICD-10-CM | POA: Insufficient documentation

## 2017-02-18 DIAGNOSIS — E1165 Type 2 diabetes mellitus with hyperglycemia: Secondary | ICD-10-CM | POA: Insufficient documentation

## 2017-02-18 NOTE — Patient Instructions (Signed)
Per Dr. Ronnie Derby note:  Since you started the Farxiga, decrease the Losartin to 1/2 tab.  Also decrease the Glimeperide to 1/2 tab at dinner. Remember to make an eye appointment.  Aim to increase your activity.  Recommendation for 30 minutes 5 days per week.  Find things that you enjoy.  Get back into the habit.  Continue to be mindful about your food choices! Great job on the changes that you have made! Check your blood sugar without judgement.  These readings are very helpful to Dr. Dwyane Dee.  Aim for 3 Carb Choices per meal (45 grams) +/- 1 either way  Aim for 0-1 Carbs per snack if hungry  Include protein in moderation with your meals and snacks Continue reading food labels for Total Carbohydrate and Fat Grams of foods Continue checking BG at alternate times per day as directed by MD  Continue taking medication as directed by MD

## 2017-02-18 NOTE — Progress Notes (Signed)
Diabetes Self-Management Education  Visit Type: First/Initial  Appt. Start Time: 1030 Appt. End Time: 12  02/18/2017  [name], identified by name and date of birth, is a 61 y.o. male with a diagnosis of Diabetes: Type 2.  Other hx includes HTN and fatty liver.  He is here today with his wife and is very frustrated because he has not lost weight.  Medications include Glimeperide, metformin, januvia, and farxiga.  He does not see that his blood sugar is better since starting the [place] and is frustrated about his morning number which is in the 150's most days.  He has not decreased the Losartin or Glimeperide to 1/2 tab as instructed by Dr. Dwyane Dee.  He will do this today after reminder.  Weight hx: Lowest weight 172 lbs 285-305 lbs for the past 10 years 305 lbs is his highest weight 294 lbs today which is stable from August. He has lost 9 lbs in the past 2-3 months but unchanged over the past month.  Patient lives with his wife and 4 yo son.  He does the shopping and cooking.  He is retired from the [employer].  ASSESSMENT  Height 5\' 11"  (1.803 m), weight 294 lb (133.4 kg). Body mass index is 41 kg/m.      Diabetes Self-Management Education - 02/18/17 1057      Visit Information   Visit Type First/Initial     Initial Visit   Diabetes Type Type 2   Are you currently following a meal plan? Yes   What type of meal plan do you follow? low carbohydrate, no added sugar   Are you taking your medications as prescribed? Yes  changes recommended by Dr. Dwyane Dee discussed   Date Diagnosed 07/2010     Health Coping   How would you rate your overall health? Good     Psychosocial Assessment   Patient Belief/Attitude about Diabetes Defeat/Burnout   Self-care barriers None   Self-management support Doctor's office;Family   Other persons present Patient;Spouse/SO   Patient Concerns Nutrition/Meal planning;Glycemic Control;Weight Control   Special Needs None   Preferred  Learning Style No preference indicated   Learning Readiness Ready   How often do you need to have someone help you when you read instructions, pamphlets, or other written materials from your doctor or pharmacy? 1 - Never   What is the last grade level you completed in school? 6 years college     Pre-Education Assessment   Patient understands the diabetes disease and treatment process. Needs Review   Patient understands incorporating nutritional management into lifestyle. Needs Review   Patient undertands incorporating physical activity into lifestyle. Needs Review   Patient understands using medications safely. Needs Review   Patient understands monitoring blood glucose, interpreting and using results Needs Review   Patient understands prevention, detection, and treatment of acute complications. Needs Review   Patient understands prevention, detection, and treatment of chronic complications. Needs Review   Patient understands how to develop strategies to address psychosocial issues. Needs Review   Patient understands how to develop strategies to promote health/change behavior. Needs Review     Complications   Last HgB A1C per patient/outside source 11.5 %  12/15/16   How often do you check your blood sugar? 1-2 times/day   Fasting Blood glucose range (mg/dL) 130-179   Postprandial Blood glucose range (mg/dL) 70-129;130-179   Number of hypoglycemic episodes per month 0   Number of hyperglycemic episodes per week 1   Can you  tell when your blood sugar is high? No   Have you had a dilated eye exam in the past 12 months? No   Have you had a dental exam in the past 12 months? Yes   Are you checking your feet? Yes   How many days per week are you checking your feet? 3     Dietary Intake   Breakfast scrambled eggs, 2 strips bacon or occasional sausage, cheese OR oatmeal with stevia, smart balance, small amount of no sugar added preserves  8   Snack (morning) nuts or greek yogurt   Lunch ham,  tomato, mayo sandwich on [employer] bread OR frozen vegetables, cheese OR canned soup, [employer] crackers, fruit  12:30-1:30   Snack (afternoon) banana   Dinner chicken or pork or baked fish, occasional brown rice but usually no carbs, vegetables OR salad  6-8   Snack (evening) nuts, 2 slices [employer] toast   Beverage(s) diet coke, unsweetened tea with stevia     Exercise   Exercise Type ADL's   How many days per week to you exercise? 0   How many minutes per day do you exercise? 0   Total minutes per week of exercise 0     Patient Education   Previous Diabetes Education No   Disease state  Definition of diabetes, type 1 and 2, and the diagnosis of diabetes   Nutrition management  Role of diet in the treatment of diabetes and the relationship between the three main macronutrients and blood glucose level;Food label reading, portion sizes and measuring food.;Meal timing in regards to the patients' current diabetes medication.;Meal options for control of blood glucose level and chronic complications.;Information on hints to eating out and maintain blood glucose control.   Physical activity and exercise  Role of exercise on diabetes management, blood pressure control and cardiac health.;Helped patient identify appropriate exercises in relation to his/her diabetes, diabetes complications and other health issue.   Monitoring Purpose and frequency of SMBG.;Identified appropriate SMBG and/or A1C goals.;Daily foot exams;Yearly dilated eye exam   Chronic complications Relationship between chronic complications and blood glucose control;Retinopathy and reason for yearly dilated eye exams   Psychosocial adjustment Worked with patient to identify barriers to care and solutions;Role of stress on diabetes;Identified and addressed patients feelings and concerns about diabetes   Personal strategies to promote health Lifestyle issues that need to be addressed for better diabetes care     Individualized Goals (developed by patient)    Nutrition General guidelines for healthy choices and portions discussed   Physical Activity Exercise 5-7 days per week;30 minutes per day   Medications take my medication as prescribed   Monitoring  test my blood glucose as discussed   Problem Solving when to exercise/making a habit   Reducing Risk examine blood glucose patterns   Health Coping discuss diabetes with (comment)  MD/RD     Post-Education Assessment   Patient understands the diabetes disease and treatment process. Demonstrates understanding / competency   Patient understands incorporating nutritional management into lifestyle. Demonstrates understanding / competency   Patient undertands incorporating physical activity into lifestyle. Demonstrates understanding / competency   Patient understands using medications safely. Demonstrates understanding / competency   Patient understands monitoring blood glucose, interpreting and using results Demonstrates understanding / competency   Patient understands prevention, detection, and treatment of acute complications. Needs Review   Patient understands prevention, detection, and treatment of chronic complications. Demonstrates understanding / competency   Patient understands how to develop strategies to address  psychosocial issues. Demonstrates understanding / competency   Patient understands how to develop strategies to promote health/change behavior. Demonstrates understanding / competency     Outcomes   Expected Outcomes Demonstrated interest in learning. Expect positive outcomes   Future DMSE PRN   Program Status Completed      Individualized Plan for Diabetes Self-Management Training:   Learning Objective:  Patient will have a greater understanding of diabetes self-management. Patient education plan is to attend individual and/or group sessions per assessed needs and concerns.   Plan:   Patient Instructions  Per Dr. Ronnie Derby note:  Since you started the Farxiga, decrease  the Losartin to 1/2 tab.  Also decrease the Glimeperide to 1/2 tab at dinner. Remember to make an eye appointment.  Aim to increase your activity.  Recommendation for 30 minutes 5 days per week.  Find things that you enjoy.  Get back into the habit.  Continue to be mindful about your food choices! Great job on the changes that you have made! Check your blood sugar without judgement.  These readings are very helpful to Dr. Dwyane Dee.  Aim for 3 Carb Choices per meal (45 grams) +/- 1 either way  Aim for 0-1 Carbs per snack if hungry  Include protein in moderation with your meals and snacks Continue reading food labels for Total Carbohydrate and Fat Grams of foods Continue checking BG at alternate times per day as directed by MD  Continue taking medication as directed by MD      Expected Outcomes:  Demonstrated interest in learning. Expect positive outcomes  Education material provided: Living Well with Diabetes, Food label handouts, A1C conversion sheet, Meal plan card, My Plate and Snack sheet  If problems or questions, patient to contact team via:  Phone  Future DSME appointment: PRN

## 2017-02-26 ENCOUNTER — Telehealth: Payer: Self-pay

## 2017-02-26 ENCOUNTER — Other Ambulatory Visit: Payer: Federal, State, Local not specified - PPO

## 2017-02-26 NOTE — Telephone Encounter (Signed)
Called [facility] to see why they have not sent the [place] yet and was placed on hold for a long time. I will have to try back another time when I have more time to call.

## 2017-03-03 ENCOUNTER — Ambulatory Visit: Payer: Federal, State, Local not specified - PPO | Admitting: Endocrinology

## 2017-03-07 ENCOUNTER — Other Ambulatory Visit: Payer: Self-pay | Admitting: Family Medicine

## 2017-03-08 NOTE — Telephone Encounter (Signed)
Last A1c was at last OV, 12/15/16. Metformin was increased to 1000 mg BID and pt advised to follow up in 1 mo. No follow-up scheduled.

## 2017-03-09 MED ORDER — METFORMIN HCL 1000 MG PO TABS: 1000.0000 mg | ORAL_TABLET | Freq: Two times a day (BID) | ORAL | 1 refills | Status: DC

## 2017-03-12 ENCOUNTER — Other Ambulatory Visit (INDEPENDENT_AMBULATORY_CARE_PROVIDER_SITE_OTHER): Payer: Federal, State, Local not specified - PPO

## 2017-03-12 DIAGNOSIS — E1165 Type 2 diabetes mellitus with hyperglycemia: Secondary | ICD-10-CM

## 2017-03-12 LAB — BASIC METABOLIC PANEL
BUN: 14 mg/dL (ref 6–23)
CHLORIDE: 100 meq/L (ref 96–112)
CO2: 28 mEq/L (ref 19–32)
CREATININE: 1.01 mg/dL (ref 0.40–1.50)
Calcium: 9.7 mg/dL (ref 8.4–10.5)
GFR: 79.11 mL/min (ref >60.00)
GLUCOSE: 179 mg/dL — AB (ref 70–99)
Potassium: 4.2 mEq/L (ref 3.5–5.1)
Sodium: 136 mEq/L (ref 135–145)

## 2017-03-13 LAB — FRUCTOSAMINE: Fructosamine: 268 umol/L (ref 0–285)

## 2017-03-14 NOTE — Progress Notes (Signed)
Patient ID: Evan [name], male   DOB: [birth year], 61 y.o.   MRN: [id number]          Reason for Appointment: for Type 2 Diabetes  Referring physician: Danise Mina   History of Present Illness:          Date of diagnosis of type 2 diabetes mellitus: 2012        Background history:   Diagnosed incidentally with a high blood sugar Has been on metformin since diagnosis and subsequently Amaryl had been added He has had sporadic follow-up for the last few years His A1c had been below 7 to about 08/2015 Subsequently in 9/17 his A1c had gone up to 8%  Recent history:      Non-insulin hypoglycemic drugs the patient is taking are: Januvia 50 mg daily, Amaryl 4 mg in the morning, metformin 1 g twice a day  Last A1c is 11.5 done in July, fructosamine now is 268  Current management, blood sugar patterns and problems identified:  He was started on Farxiga on his initial consultation in August when he still was having blood sugars mostly over 200 despite using Januvia  With this his blood sugars are improving although he did not bring his monitor for download  His highest blood sugars are after supper and are still relatively high in the morning  He has lost about 4 pounds since his last visit  He is still trying to do fairly well with diet although not always consistently watching high-fat foods or portions, he is trying to improve his diet since seeing the dietitian about 4 weeks ago  No hypoglycemia with starting [place] and he did not cut down his Amaryl to half tablet as directed  No complaints of side effects with Wilder Glade but he is concerned about the cost        Side effects from medications have been: None  Compliance with the medical regimen: Improving Hypoglycemia:   none  Glucose monitoring:  done 1-2 times a day         Glucometer: One Touch Verio .      Blood Glucose readings by review of his iPad record  Range 105-183, am 150:  Self-care: The diet that the patient  has been following is: tries to limit [employer], Carbohydrates and avoiding drinks with sugar .      Typical meal intake: Breakfast is usually Eggs and cheese.  Lunch is usually [place] yogurt.  Dinner: meat and vegetables    His snacks are fruits like apples and nuts             Dietician visit, most recent: 02/2017               Exercise:  walking throughout the day  Weight history:  Wt Readings from Last 3 Encounters:  03/15/17 291 lb (132 kg)  02/18/17 294 lb (133.4 kg)  02/03/17 295 lb (133.8 kg)   3 Glycemic control:   Lab Results  Component Value Date   HGBA1C 11.5 (H) 12/15/2016   HGBA1C 8.0 (H) 02/18/2016   HGBA1C 6.7 (H) 09/10/2015   Lab Results  Component Value Date   MICROALBUR <0.7 10/29/2014   LDLCALC 92 09/10/2015   CREATININE 1.01 03/12/2017   Lab Results  Component Value Date   MICRALBCREAT 0.5 10/29/2014    Lab Results  Component Value Date   FRUCTOSAMINE 268 03/12/2017      Allergies as of 03/15/2017      Reactions   Penicillins  Respiratory distress as infant      Medication List       Accurate as of 03/15/17 11:59 PM. Always use your most recent med list.          aspirin 81 MG tablet Take 81 mg by mouth daily.   dapagliflozin propanediol 10 MG Tabs tablet Commonly known as:  FARXIGA Take 10 mg by mouth daily.   glimepiride 4 MG tablet Commonly known as:  AMARYL Take 1 tablet (4 mg total) by mouth daily before breakfast.   glucose blood test strip Commonly known as:  ONE TOUCH ULTRA TEST 1 each by Other route See admin instructions. TID and PRN E11.65   glucose blood test strip Commonly known as:  ONETOUCH VERIO Use to check blood sugars 2 times daily   ibuprofen 200 MG tablet Commonly known as:  ADVIL,MOTRIN Take 200 mg by mouth at bedtime as needed.   losartan 100 MG tablet Commonly known as:  COZAAR TAKE 1 TABLET BY MOUTH EVERY DAY   metFORMIN 1000 MG tablet Commonly known as:  GLUCOPHAGE Take 1 tablet (1,000  mg total) by mouth 2 (two) times daily with a meal.   MULTIVITAMIN PO Take 1 tablet by mouth daily.   omeprazole 40 MG capsule Commonly known as:  PRILOSEC TAKE ONE CAPSULE BY MOUTH EVERY DAY AS NEEDED   ONETOUCH VERIO FLEX SYSTEM w/Device Kit 1 kit by Does not apply route daily. Use to check blood sugars 2 times daily   pravastatin 20 MG tablet Commonly known as:  PRAVACHOL TAKE 1 TABLET (20 MG TOTAL) BY MOUTH AT BEDTIME.   sildenafil 100 MG tablet Commonly known as:  VIAGRA Take 0.5-1 tablets (50-100 mg total) by mouth as needed for erectile dysfunction.   sitaGLIPtin 50 MG tablet Commonly known as:  JANUVIA Take 1 tablet (50 mg total) by mouth daily.   Turmeric 500 MG Caps Take by mouth.   Vitamin B12 3000 MCG Subl Place 3,000 mcg under the tongue.   Vitamin D3 1000 units Caps Take 1 capsule (1,000 Units total) by mouth daily.       Allergies:  Allergies  Allergen Reactions  . Penicillins     Respiratory distress as infant    Past Medical History:  Diagnosis Date  . Depression    intolerant of cymbalta and wellbutrin  . Diverticulosis of colon   . Esophagitis 03/2015   by EGD  . Gastritis 03/2015   by EGD - mild, chronic  . Hyperlipidemia   . Hypertension   . Lichen simplex chronicus 05/2015   R scrotum [name])  . Murmur    aortic sclerosis, SBE  prophlaxis not needed as per cardio  . NAFLD (nonalcoholic fatty liver disease) 01/2015   by [place]  . 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 T2DM (type 2 diabetes mellitus) ([place])    DSME 09/2013    Past Surgical History:  Procedure Laterality Date  . COLONOSCOPY  2006   diverticulosis per pt  . COLONOSCOPY  03/2015   2 polyps, rpt 5 yrs [name])  . ESOPHAGOGASTRODUODENOSCOPY  03/2015   mild chronic gastritis, esophagitis   . LUMBAR LAMINECTOMY  1989   L5-S1    Family History  Problem Relation Age of Onset  . Alzheimer's disease Father   . Hypertension Father   . Stroke Father     . Hyperlipidemia Father   . Coronary artery disease Brother 63       diabetic, MI  .  Prostate cancer Brother   . Diabetes Brother   . Coronary artery disease Maternal Uncle   . Schizophrenia Paternal Uncle   . Emphysema Mother     Social History:  reports that he has never smoked. He has never used smokeless tobacco. He reports that he drinks alcohol. He reports that he does not use drugs.   Review of Systems    Lipid history: On pravastatin for a few years, Needs follow-up    Lab Results  Component Value Date   CHOL 192 09/10/2015   HDL 72.40 09/10/2015   LDLCALC 92 09/10/2015   LDLDIRECT 99.0 05/20/2015   TRIG 140.0 09/10/2015   CHOLHDL 3 09/10/2015           Hypertension: On Losartan 1/2 Tablet now with doses reduced after starting Farxiga  Most recent eye exam was 2016  Most recent foot exam: 8/18    LABS:  Lab on 03/12/2017  Component Date Value Ref Range Status  . Sodium 03/12/2017 136  135 - 145 mEq/L Final  . Potassium 03/12/2017 4.2  3.5 - 5.1 mEq/L Final  . Chloride 03/12/2017 100  96 - 112 mEq/L Final  . CO2 03/12/2017 28  19 - 32 mEq/L Final  . Glucose, Bld 03/12/2017 179* 70 - 99 mg/dL Final  . BUN 03/12/2017 14  6 - 23 mg/dL Final  . Creatinine, Ser 03/12/2017 1.01  0.40 - 1.50 mg/dL Final  . Calcium 03/12/2017 9.7  8.4 - 10.5 mg/dL Final  . GFR 03/12/2017 79.11  >60.00 mL/min Final  . Fructosamine 03/12/2017 268  0 - 285 umol/L Final   Comment: Published reference interval for apparently healthy subjects between age 26 and 60 is 73 - 285 umol/L and in a poorly controlled diabetic population is 228 - 563 umol/L with a mean of 396 umol/L.     Physical Examination:  BP 140/70   Pulse 72   Ht '5\' 11"'$  (1.803 m)   Wt 291 lb (132 kg)   SpO2 97%   BMI 40.59 kg/m     ASSESSMENT:  Diabetes type 2, uncontrolled    Most recent A1c was 11.5  Currently on a regimen of Amaryl, metformin, Farxiga 5 mg and low-dose Januvia  With adding  [place] his blood sugars are significantly better as shown by his own readings and also fructosamine level  His weight is slightly better Diet is improving   HYPERTENSION: Well controlled with losartan 50 mg now  PLAN:    Encouraged him to continue [place], he will check on the cost of this and the local drugstore with the co-pay card  He will need to check his blood sugars at various times and discussed blood sugar targets  He'll bring his monitor for download on the next visit  To increase Januvia to 100 mg on his next prescription  Consider reducing Amaryl if he is having low normal sugars  Consistent diet Influenza vaccine given  Patient Instructions  Check blood sugars on waking up 3-4/7   Also check blood sugars about 2 hours after a meal and do this after different meals by rotation  Recommended blood sugar levels on waking up is 90-130 and about 2 hours after meal is 130-160  Please bring your blood sugar monitor to each visit, thank you  Wilder Glade 1/2 of 10  Januvia will be '100mg'$      Consultation note has been sent to the referring physician  [facility] 03/16/2017, 1:27 PM   Note: This office note was  prepared with Estate agent. Any transcriptional errors that result from this process are unintentional.

## 2017-03-15 ENCOUNTER — Encounter: Payer: Self-pay | Admitting: Endocrinology

## 2017-03-15 ENCOUNTER — Other Ambulatory Visit: Payer: Federal, State, Local not specified - PPO

## 2017-03-15 ENCOUNTER — Ambulatory Visit (INDEPENDENT_AMBULATORY_CARE_PROVIDER_SITE_OTHER): Payer: Federal, State, Local not specified - PPO | Admitting: Endocrinology

## 2017-03-15 VITALS — BP 140/70 | HR 72 | Ht 71.0 in | Wt 291.0 lb

## 2017-03-15 DIAGNOSIS — Z23 Encounter for immunization: Secondary | ICD-10-CM

## 2017-03-15 DIAGNOSIS — E1165 Type 2 diabetes mellitus with hyperglycemia: Secondary | ICD-10-CM

## 2017-03-15 MED ORDER — DAPAGLIFLOZIN PROPANEDIOL 10 MG PO TABS: 10.0000 mg | ORAL_TABLET | Freq: Every day | ORAL | 3 refills | Status: DC

## 2017-03-15 NOTE — Patient Instructions (Addendum)
Check blood sugars on waking up 3-4/7   Also check blood sugars about 2 hours after a meal and do this after different meals by rotation  Recommended blood sugar levels on waking up is 90-130 and about 2 hours after meal is 130-160  Please bring your blood sugar monitor to each visit, thank you  Wilder Glade 1/2 of 10  Januvia will be 100mg 

## 2017-03-16 MED ORDER — SITAGLIPTIN PHOSPHATE 100 MG PO TABS: 100.0000 mg | ORAL_TABLET | Freq: Every day | ORAL | 3 refills | Status: DC

## 2017-03-17 NOTE — Progress Notes (Signed)
Please call to let patient know that the kidney test is okay, blood sugar 179.  Need to schedule follow-up in 2 months, this has not been done

## 2017-04-22 ENCOUNTER — Other Ambulatory Visit: Payer: Self-pay | Admitting: Family Medicine

## 2017-04-30 ENCOUNTER — Other Ambulatory Visit: Payer: Self-pay | Admitting: Family Medicine

## 2017-05-17 ENCOUNTER — Other Ambulatory Visit (INDEPENDENT_AMBULATORY_CARE_PROVIDER_SITE_OTHER): Payer: Federal, State, Local not specified - PPO

## 2017-05-17 DIAGNOSIS — E1165 Type 2 diabetes mellitus with hyperglycemia: Secondary | ICD-10-CM | POA: Diagnosis not present

## 2017-05-17 LAB — COMPREHENSIVE METABOLIC PANEL
ALBUMIN: 4.4 g/dL (ref 3.5–5.2)
ALT: 41 U/L (ref 0–53)
AST: 29 U/L (ref 0–37)
Alkaline Phosphatase: 48 U/L (ref 39–117)
BILIRUBIN TOTAL: 0.5 mg/dL (ref 0.2–1.2)
BUN: 16 mg/dL (ref 6–23)
CALCIUM: 9.9 mg/dL (ref 8.4–10.5)
CO2: 25 meq/L (ref 19–32)
CREATININE: 1.05 mg/dL (ref 0.40–1.50)
Chloride: 100 mEq/L (ref 96–112)
GFR: 75.6 mL/min (ref >60.00)
Glucose, Bld: 151 mg/dL — ABNORMAL HIGH (ref 70–99)
Potassium: 4.6 mEq/L (ref 3.5–5.1)
Sodium: 136 mEq/L (ref 135–145)
TOTAL PROTEIN: 7.5 g/dL (ref 6.0–8.3)

## 2017-05-17 LAB — LIPID PANEL
CHOLESTEROL: 181 mg/dL (ref 0–200)
HDL: 58.3 mg/dL (ref >39.00)
LDL CALC: 103 mg/dL — AB (ref 0–99)
NonHDL: 122.5
TRIGLYCERIDES: 98 mg/dL (ref 0.0–149.0)
Total CHOL/HDL Ratio: 3
VLDL: 19.6 mg/dL (ref 0.0–40.0)

## 2017-05-17 LAB — HEMOGLOBIN A1C: Hgb A1c MFr Bld: 6.8 % — ABNORMAL HIGH (ref 4.6–6.5)

## 2017-05-18 NOTE — Progress Notes (Signed)
Patient ID: Evan [name], male   DOB: 1956/01/05, 61 y.o.   MRN: [id number]          Reason for Appointment: for Type 2 Diabetes  Referring physician: Danise Mina   History of Present Illness:          Date of diagnosis of type 2 diabetes mellitus: 2012        Background history:   Diagnosed incidentally with a high blood sugar Has been on metformin since diagnosis and subsequently Amaryl had been added He has had sporadic follow-up for the last few years His A1c had been below 7 to about 08/2015 Subsequently in 9/17 his A1c had gone up to 8%  Recent history:      Non-insulin hypoglycemic drugs the patient is taking are: Farxiga 10 mg, Januvia 50 mg daily, Amaryl 4 mg in the  , metformin 1 g twice a day  His A1c is markedly better at 6.8, previously 11.5 done in July, fructosamine in October is 268  Current management, blood sugar patterns and problems identified:  He was started on Farxiga on his initial consultation in August when he still was having blood sugars mostly over 200  Although he is concerned about the cost of medications he is able to afford the [place] with his co-pay card  He is still however recently not losing weight despite continuing the [place]  Now taking Amaryl in the evening since his blood sugars tend to be higher fasting and lowest before supper  Did not bring his monitor today  He is however not losing weight despite using [place]  He did feel a little weak at night once around 9 PM when blood sugar was 73 and got better with eating an apple, no other episodes of hypoglycemia despite his being active during the day        Side effects from medications have been: None  Compliance with the medical regimen: Improving  Glucose monitoring:  done 1-2 times a day         Glucometer: One Touch Verio .      Blood Glucose readings by recall    Before breakfast usually about 150, around suppertime 100 PC supper usually 130-170, once 73  Self-care:  The diet that the patient has been following is: tries to limit [employer], Carbohydrates and avoiding drinks with sugar .      Typical meal intake: Breakfast is usually Eggs and cheese.  Lunch is usually [place] yogurt.  Dinner: meat and vegetables    His snacks are fruits like apples and nuts             Dietician visit, most recent: 02/2017               Exercise:  walking throughout the day  Weight history:  Wt Readings from Last 3 Encounters:  05/19/17 294 lb 3.2 oz (133.4 kg)  03/15/17 291 lb (132 kg)  02/18/17 294 lb (133.4 kg)    Glycemic control:   Lab Results  Component Value Date   HGBA1C 6.8 (H) 05/17/2017   HGBA1C 11.5 (H) 12/15/2016   HGBA1C 8.0 (H) 02/18/2016   Lab Results  Component Value Date   MICROALBUR 0.1 05/17/2017   LDLCALC 103 (H) 05/17/2017   CREATININE 1.05 05/17/2017   Lab Results  Component Value Date   MICRALBCREAT 0.3 05/17/2017    Lab Results  Component Value Date   FRUCTOSAMINE 268 03/12/2017      Allergies as of 05/19/2017  Reactions   Penicillins    Respiratory distress as infant      Medication List        Accurate as of 05/19/17  9:27 AM. Always use your most recent med list.          aspirin 81 MG tablet Take 81 mg by mouth daily.   dapagliflozin propanediol 10 MG Tabs tablet Commonly known as:  FARXIGA Take 10 mg by mouth daily.   glimepiride 4 MG tablet Commonly known as:  AMARYL Take 1 tablet (4 mg total) by mouth daily before breakfast.   glucose blood test strip Commonly known as:  ONETOUCH VERIO Use to check blood sugars 2 times daily   ibuprofen 200 MG tablet Commonly known as:  ADVIL,MOTRIN Take 200 mg by mouth at bedtime as needed.   losartan 100 MG tablet Commonly known as:  COZAAR TAKE 1 TABLET BY MOUTH EVERY DAY   metFORMIN 1000 MG tablet Commonly known as:  GLUCOPHAGE Take 1 tablet (1,000 mg total) by mouth 2 (two) times daily with a meal.   omeprazole 40 MG capsule Commonly known  as:  PRILOSEC TAKE ONE CAPSULE BY MOUTH EVERY DAY AS NEEDED   ONETOUCH VERIO FLEX SYSTEM w/Device Kit 1 kit by Does not apply route daily. Use to check blood sugars 2 times daily   pravastatin 20 MG tablet Commonly known as:  PRAVACHOL TAKE 1 TABLET (20 MG TOTAL) BY MOUTH AT BEDTIME.   sildenafil 100 MG tablet Commonly known as:  VIAGRA Take 0.5-1 tablets (50-100 mg total) by mouth as needed for erectile dysfunction.   sitaGLIPtin 100 MG tablet Commonly known as:  JANUVIA Take 1 tablet (100 mg total) by mouth daily.   Turmeric 500 MG Caps Take by mouth.   Vitamin B12 3000 MCG Subl Place 3,000 mcg under the tongue.   Vitamin D3 1000 units Caps Take 1 capsule (1,000 Units total) by mouth daily.       Allergies:  Allergies  Allergen Reactions  . Penicillins     Respiratory distress as infant    Past Medical History:  Diagnosis Date  . Depression    intolerant of cymbalta and wellbutrin  . Diverticulosis of colon   . Esophagitis 03/2015   by EGD  . Gastritis 03/2015   by EGD - mild, chronic  . Hyperlipidemia   . Hypertension   . Lichen simplex chronicus 05/2015   R scrotum [name])  . Murmur    aortic sclerosis, SBE  prophlaxis not needed as per cardio  . NAFLD (nonalcoholic fatty liver disease) 01/2015   by [place]  . 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 T2DM (type 2 diabetes mellitus) ([place])    DSME 09/2013    Past Surgical History:  Procedure Laterality Date  . COLONOSCOPY  2006   diverticulosis per pt  . COLONOSCOPY  03/2015   2 polyps, rpt 5 yrs [name])  . ESOPHAGOGASTRODUODENOSCOPY  03/2015   mild chronic gastritis, esophagitis   . LUMBAR LAMINECTOMY  1989   L5-S1    Family History  Problem Relation Age of Onset  . Alzheimer's disease Father   . Hypertension Father   . Stroke Father   . Hyperlipidemia Father   . Coronary artery disease Brother 73       diabetic, MI  . Prostate cancer Brother   . Diabetes Brother   .  Coronary artery disease Maternal Uncle   . Schizophrenia Paternal Uncle   . Emphysema  Mother     Social History:  reports that  has never smoked. he has never used smokeless tobacco. He reports that he drinks alcohol. He reports that he does not use drugs.   Review of Systems  Lipid history: On pravastatin only 20 mg for a few years, Needs LDL to be below 100 Diet is generally fairly good   Lab Results  Component Value Date   CHOL 181 05/17/2017   HDL 58.30 05/17/2017   LDLCALC 103 (H) 05/17/2017   LDLDIRECT 99.0 05/20/2015   TRIG 98.0 05/17/2017   CHOLHDL 3 05/17/2017           Hypertension: On Losartan 1/2 Tablet with good control  Most recent eye exam was 2016  FATTY liver: His ALT is back to normal now He does however need to lose weight  Most recent foot exam: 8/18    LABS:  Lab on 05/17/2017  Component Date Value Ref Range Status  . Cholesterol 05/17/2017 181  0 - 200 mg/dL Final   ATP III Classification       Desirable:  < 200 mg/dL               Borderline High:  200 - 239 mg/dL          High:  > = 240 mg/dL  . Triglycerides 05/17/2017 98.0  0.0 - 149.0 mg/dL Final   Normal:  <150 mg/dLBorderline High:  150 - 199 mg/dL  . HDL 05/17/2017 58.30  >39.00 mg/dL Final  . VLDL 05/17/2017 19.6  0.0 - 40.0 mg/dL Final  . LDL Cholesterol 05/17/2017 103* 0 - 99 mg/dL Final  . Total CHOL/HDL Ratio 05/17/2017 3   Final                  Men          Women1/2 Average Risk     3.4          3.3Average Risk          5.0          4.42X Average Risk          9.6          7.13X Average Risk          15.0          11.0                      . NonHDL 05/17/2017 122.50   Final   NOTE:  Non-HDL goal should be 30 mg/dL higher than patient's LDL goal (i.e. LDL goal of < 70 mg/dL, would have non-HDL goal of < 100 mg/dL)  . Microalb, Ur 05/17/2017 0.1  0.0 - 1.9 mg/dL Final  . Creatinine,U 05/17/2017 47.3  mg/dL Final  . Microalb Creat Ratio 05/17/2017 0.3  0.0 - 30.0 mg/g Final  .  Sodium 05/17/2017 136  135 - 145 mEq/L Final  . Potassium 05/17/2017 4.6  3.5 - 5.1 mEq/L Final  . Chloride 05/17/2017 100  96 - 112 mEq/L Final  . CO2 05/17/2017 25  19 - 32 mEq/L Final  . Glucose, Bld 05/17/2017 151* 70 - 99 mg/dL Final  . BUN 05/17/2017 16  6 - 23 mg/dL Final  . Creatinine, Ser 05/17/2017 1.05  0.40 - 1.50 mg/dL Final  . Total Bilirubin 05/17/2017 0.5  0.2 - 1.2 mg/dL Final  . Alkaline Phosphatase 05/17/2017 48  39 - 117 U/L Final  . AST 05/17/2017 29  0 - 37 U/L Final  . ALT 05/17/2017 41  0 - 53 U/L Final  . Total Protein 05/17/2017 7.5  6.0 - 8.3 g/dL Final  . Albumin 05/17/2017 4.4  3.5 - 5.2 g/dL Final  . Calcium 05/17/2017 9.9  8.4 - 10.5 mg/dL Final  . GFR 05/17/2017 75.60  >60.00 mL/min Final  . Hgb A1c MFr Bld 05/17/2017 6.8* 4.6 - 6.5 % Final   Glycemic Control Guidelines for People with Diabetes:Non Diabetic:  <6%Goal of Therapy: <7%Additional Action Suggested:  >8%     Physical Examination:  BP 130/82   Pulse 81   Ht '5\' 11"'  (1.803 m)   Wt 294 lb 3.2 oz (133.4 kg)   SpO2 96%   BMI 41.03 kg/m     ASSESSMENT:  Diabetes type 2, BMI 41  See history of present illness for detailed discussion of current diabetes management, blood sugar patterns and problems identified     Most recent A1c is 6.8, previously was 11.5 With adding [place] his blood sugars are significantly better, also on 4 mg Amaryl, metformin and low-dose Januvia He tends to have relatively high fasting readings but reportedly did not consistently high at that time He can do better with diet and bring his weight down  HYPERTENSION: Well controlled  HYPERLIPIDEMIA: His LDL is 103 and discussed that he does need a more effective dose of pravastatin, will increase the dose to 40 mg  PLAN:    Encouraged him to plan his meals better and try to lose weight  He will continue Farxiga 10 mg and also the other medications unchanged  He does not want to use a combination of Farxiga and  metformin currently  He will is not his blood sugars start increasing Also if he has tendency to hypoglycemia more than randomly he will reduce Amaryl in half Will send new prescription for 40 mg PRAVASTATIN Follow-up in 3 months  There are no Patient Instructions on file for this visit.    Wlliam Grosso 05/19/2017, 9:27 AM   Note: This office note was prepared with Dragon voice recognition system technology. Any transcriptional errors that result from this process are unintentional.

## 2017-05-19 ENCOUNTER — Encounter: Payer: Self-pay | Admitting: Endocrinology

## 2017-05-19 ENCOUNTER — Ambulatory Visit: Payer: Federal, State, Local not specified - PPO | Admitting: Endocrinology

## 2017-05-19 VITALS — BP 130/82 | HR 81 | Ht 71.0 in | Wt 294.2 lb

## 2017-05-19 DIAGNOSIS — E1169 Type 2 diabetes mellitus with other specified complication: Secondary | ICD-10-CM | POA: Diagnosis not present

## 2017-05-19 DIAGNOSIS — E7800 Pure hypercholesterolemia, unspecified: Secondary | ICD-10-CM | POA: Diagnosis not present

## 2017-05-19 DIAGNOSIS — E669 Obesity, unspecified: Secondary | ICD-10-CM | POA: Diagnosis not present

## 2017-05-19 MED ORDER — PRAVASTATIN SODIUM 40 MG PO TABS: 40.0000 mg | ORAL_TABLET | Freq: Every day | ORAL | 3 refills | Status: DC

## 2017-05-21 LAB — MICROALBUMIN / CREATININE URINE RATIO
Creatinine,U: 47.3 mg/dL
Microalb Creat Ratio: 1.5 mg/g (ref 0.0–30.0)
Microalb, Ur: <0.7 mg/dL (ref 0.0–1.9)

## 2017-05-22 ENCOUNTER — Other Ambulatory Visit: Payer: Self-pay | Admitting: Family Medicine

## 2017-06-17 DIAGNOSIS — K080 Exfoliation of teeth due to systemic causes: Secondary | ICD-10-CM | POA: Diagnosis not present

## 2017-07-31 ENCOUNTER — Other Ambulatory Visit: Payer: Self-pay | Admitting: Family Medicine

## 2017-08-13 ENCOUNTER — Telehealth: Payer: Self-pay | Admitting: Endocrinology

## 2017-08-13 ENCOUNTER — Other Ambulatory Visit: Payer: Federal, State, Local not specified - PPO

## 2017-08-13 NOTE — Telephone Encounter (Signed)
Patient is returning your call.  

## 2017-08-16 NOTE — Telephone Encounter (Signed)
Patient requested to reschedule appts cancelled last week due to flu.

## 2017-08-17 ENCOUNTER — Ambulatory Visit: Payer: Federal, State, Local not specified - PPO | Admitting: Endocrinology

## 2017-08-24 ENCOUNTER — Other Ambulatory Visit: Payer: Self-pay | Admitting: Family Medicine

## 2017-10-07 ENCOUNTER — Encounter: Payer: Self-pay | Admitting: Family Medicine

## 2017-10-07 ENCOUNTER — Ambulatory Visit: Payer: Federal, State, Local not specified - PPO | Admitting: Family Medicine

## 2017-10-07 VITALS — BP 130/78 | HR 75 | Temp 97.8°F | Ht 71.0 in | Wt 292.5 lb

## 2017-10-07 DIAGNOSIS — R21 Rash and other nonspecific skin eruption: Secondary | ICD-10-CM | POA: Insufficient documentation

## 2017-10-07 DIAGNOSIS — N529 Male erectile dysfunction, unspecified: Secondary | ICD-10-CM

## 2017-10-07 DIAGNOSIS — J302 Other seasonal allergic rhinitis: Secondary | ICD-10-CM | POA: Diagnosis not present

## 2017-10-07 DIAGNOSIS — M25562 Pain in left knee: Secondary | ICD-10-CM | POA: Diagnosis not present

## 2017-10-07 DIAGNOSIS — G8929 Other chronic pain: Secondary | ICD-10-CM

## 2017-10-07 DIAGNOSIS — Z125 Encounter for screening for malignant neoplasm of prostate: Secondary | ICD-10-CM | POA: Diagnosis not present

## 2017-10-07 LAB — PSA: PSA: 0.46 ng/mL (ref 0.10–4.00)

## 2017-10-07 MED ORDER — SILDENAFIL CITRATE 20 MG PO TABS: 40.0000 mg | ORAL_TABLET | Freq: Every day | ORAL | 3 refills | Status: DC | PRN

## 2017-10-07 MED ORDER — DESONIDE 0.05 % EX CREA: TOPICAL_CREAM | Freq: Two times a day (BID) | CUTANEOUS | 0 refills | Status: DC

## 2017-10-07 NOTE — Assessment & Plan Note (Signed)
Trial generic low dose sildenafil 20mg . Discussed this would not be covered by insurance.

## 2017-10-07 NOTE — Progress Notes (Signed)
BP 130/78 (BP Location: Left Arm, Patient Position: Sitting, Cuff Size: Large)   Pulse 75   Temp 97.8 F (36.6 C) (Oral)   Ht 5' 11" (1.803 m)   Wt 292 lb 8 oz (132.7 kg)   SpO2 95%   BMI 40.80 kg/m    CC: L knee pain Subjective:    Patient ID: Evan [name], male    DOB: 25-Jun-1955, 62 y.o.   MRN: [id number]  HPI: Evan [name] is a 62 y.o. male presenting on 10/07/2017 for Knee Pain (Left knee pain off and on for yrs. Worsened in last 6 mos. Worsenes as the day goes on. Wears a brace, which has started hurting during the day but pt needs it at night.  Ibuprofen helps with pain. ); Allergies (Has cough due to allergies. Taking Alavert.); and Psoriasis (Wants area on left ear checked.)   Intermittent L>R knee pain for years, worse the past 6 weeks. Describes pain anteriorly and to sides. Denies inciting trauma/injury to knee. Uses brace with more benefit at night time. Brace during the day worsens pain. Worse pain with lateral movement. Did have a fall out of the bed last week with bruising. Treating with ibuprofen up to 6 a day. No knee locking or unsteadiness.  Walking 10-12000 steps - this is limited due to knee pain.   Worsening allergies despite daily alavert over last few days. Productive cough with mucous. No significant h/o seasonal allergies. + rhinorrhea, sneezing, congestion, itchy watery eyes. No fevers/chills.   H/o lichen simplex chronicus to scrotum, also had rash to R eyebrow that resolved with mometasone. Now with new rash above L ear not improved with cortisone-10 or cetaphil. He did recently change shampoo to anti-dandruff last week.   Requests generic sildenafil  Requests PSA check. Overdue for CPE.   Relevant past medical, surgical, family and social history reviewed and updated as indicated. Interim medical history since our last visit reviewed. Allergies and medications reviewed and updated. Outpatient Medications Prior to Visit  Medication Sig Dispense  Refill  . aspirin 81 MG tablet Take 81 mg by mouth daily.      . Blood Glucose Monitoring Suppl (ONETOUCH VERIO FLEX SYSTEM) w/Device KIT 1 kit by Does not apply route daily. Use to check blood sugars 2 times daily 1 kit 2  . Cholecalciferol (VITAMIN D3) 1000 units CAPS Take 1 capsule (1,000 Units total) by mouth daily. 30 capsule   . Cyanocobalamin (VITAMIN B12) 3000 MCG SUBL Place 3,000 mcg under the tongue.    . dapagliflozin propanediol (FARXIGA) 10 MG TABS tablet Take 10 mg by mouth daily. 30 tablet 3  . glimepiride (AMARYL) 4 MG tablet TAKE 1 TABLET (4 MG TOTAL) BY MOUTH DAILY BEFORE BREAKFAST. 30 tablet 2  . glucose blood (ONETOUCH VERIO) test strip Use to check blood sugars 2 times daily 200 each 3  . ibuprofen (ADVIL,MOTRIN) 200 MG tablet Take 200 mg by mouth at bedtime as needed.    Marland Kitchen losartan (COZAAR) 100 MG tablet TAKE 1 TABLET BY MOUTH EVERY DAY 90 tablet 0  . metFORMIN (GLUCOPHAGE) 1000 MG tablet Take 1 tablet (1,000 mg total) by mouth 2 (two) times daily with a meal. 180 tablet 1  . omeprazole (PRILOSEC) 40 MG capsule TAKE ONE CAPSULE BY MOUTH EVERY DAY AS NEEDED 90 capsule 3  . pravastatin (PRAVACHOL) 40 MG tablet Take 1 tablet (40 mg total) by mouth daily. 90 tablet 3  . sitaGLIPtin (JANUVIA) 100 MG tablet Take 1  tablet (100 mg total) by mouth daily. 30 tablet 3  . Turmeric 500 MG CAPS Take by mouth.    . sildenafil (VIAGRA) 100 MG tablet Take 0.5-1 tablets (50-100 mg total) by mouth as needed for erectile dysfunction. 10 tablet 1  . pravastatin (PRAVACHOL) 20 MG tablet TAKE 1 TABLET (20 MG TOTAL) BY MOUTH AT BEDTIME. 90 tablet 0   No facility-administered medications prior to visit.      Per HPI unless specifically indicated in ROS section below Review of Systems     Objective:    BP 130/78 (BP Location: Left Arm, Patient Position: Sitting, Cuff Size: Large)   Pulse 75   Temp 97.8 F (36.6 C) (Oral)   Ht 5' 11" (1.803 m)   Wt 292 lb 8 oz (132.7 kg)   SpO2 95%   BMI  40.80 kg/m   Wt Readings from Last 3 Encounters:  10/07/17 292 lb 8 oz (132.7 kg)  05/19/17 294 lb 3.2 oz (133.4 kg)  03/15/17 291 lb (132 kg)    Physical Exam  Constitutional: He appears well-developed and well-nourished. No distress.  HENT:  Nose: Mucosal edema present. No rhinorrhea. Right sinus exhibits no maxillary sinus tenderness and no frontal sinus tenderness. Left sinus exhibits no maxillary sinus tenderness and no frontal sinus tenderness.  Mouth/Throat: Oropharynx is clear and moist. No oropharyngeal exudate.  Thick present mucous L nare  Musculoskeletal: Normal range of motion. He exhibits no edema.  R knee WNL L knee exam: No deformity on inspection. Tender to palpation medial joint line  Mild swelling anterior knee  FROM in flex/extension with mild crepitus. No popliteal fullness. Neg drawer test. Sore with mcmurray test. + pain with valgus stress. No PFgrind. No abnormal patellar mobility.   Skin: Skin is warm and dry. Rash noted.  Mild irritation/erythema L scalp just above ear without significant scaling  Nursing note and vitals reviewed.  Lab Results  Component Value Date   HGBA1C 6.8 (H) 05/17/2017       Assessment & Plan:   Problem List Items Addressed This Visit    Chronic pain of left knee - Primary    Osteoarthritis flare vs meniscal tear/MCL sprain. Supportive care reviewed. NSAID, ice, elevation, brace use. Provided with meniscal tear exercises from [facility] pt advisor. RTC next week for xrays (our xrays are down today).       Relevant Orders   DG Knee Complete 4 Views Left   ERECTILE DYSFUNCTION, ORGANIC    Trial generic low dose sildenafil 68m. Discussed this would not be covered by insurance.       Seasonal allergic rhinitis    Anticipate pollen related. Continue antihistamine. Start nasal steroid. Reviewed mechanism of action of medication as well as monitoring for epistaxis.       Skin rash    On scalp. Trial desonide.         Other  Visit Diagnoses    Special screening for malignant neoplasm of prostate       Relevant Orders   PSA       Meds ordered this encounter  Medications  . sildenafil (REVATIO) 20 MG tablet    Sig: Take 2-5 tablets (40-100 mg total) by mouth daily as needed (relations).    Dispense:  30 tablet    Refill:  3  . desonide (DESOWEN) 0.05 % cream    Sig: Apply topically 2 (two) times daily. Apply to AA.    Dispense:  30 g    Refill:  0   Orders Placed This Encounter  Procedures  . DG Knee Complete 4 Views Left    Standing Status:   Future    Standing Expiration Date:   12/08/2018    Order Specific Question:   Reason for Exam (SYMPTOM  OR DIAGNOSIS REQUIRED)    Answer:   L medial knee pain for months    Order Specific Question:   Preferred imaging location?    Answer:   [facility]    Order Specific Question:   Radiology Contrast Protocol - do NOT remove file path    Answer:   [email or web address]  . PSA    Follow up plan: No follow-ups on file.  Ria Bush, MD

## 2017-10-07 NOTE — Assessment & Plan Note (Signed)
On scalp. Trial desonide.

## 2017-10-07 NOTE — Assessment & Plan Note (Addendum)
Osteoarthritis flare vs meniscal tear/MCL sprain. Supportive care reviewed. NSAID, ice, elevation, brace use. Provided with meniscal tear exercises from [facility] pt advisor. RTC next week for xrays (our xrays are down today).

## 2017-10-07 NOTE — Patient Instructions (Addendum)
PSA today Try intranasal steroid like flonase or rhinocort/nasacort.  I am suspicious for arthritis of knee and possible meniscal/ligament injury on the inner knee. Do exercises provided, ice to knee, continue ibuprofen, try exercises provided today. Return next week for xray of knee.  For rash on scalp - may try desonide cream.  Return at your convenience for physical.

## 2017-10-07 NOTE — Assessment & Plan Note (Signed)
Anticipate pollen related. Continue antihistamine. Start nasal steroid. Reviewed mechanism of action of medication as well as monitoring for epistaxis.

## 2017-10-14 ENCOUNTER — Other Ambulatory Visit: Payer: Self-pay | Admitting: Family Medicine

## 2017-10-14 ENCOUNTER — Ambulatory Visit (INDEPENDENT_AMBULATORY_CARE_PROVIDER_SITE_OTHER)
Admission: RE | Admit: 2017-10-14 | Discharge: 2017-10-14 | Disposition: A | Discharge status: Home / Self Care | Payer: Federal, State, Local not specified - PPO | Source: Ambulatory Visit | Attending: Family Medicine | Admitting: Family Medicine

## 2017-10-14 DIAGNOSIS — M25562 Pain in left knee: Secondary | ICD-10-CM

## 2017-10-14 DIAGNOSIS — G8929 Other chronic pain: Secondary | ICD-10-CM

## 2017-10-14 DIAGNOSIS — M1712 Unilateral primary osteoarthritis, left knee: Secondary | ICD-10-CM | POA: Diagnosis not present

## 2017-10-19 ENCOUNTER — Other Ambulatory Visit: Payer: Self-pay | Admitting: Endocrinology

## 2017-10-20 ENCOUNTER — Other Ambulatory Visit: Payer: Federal, State, Local not specified - PPO

## 2017-10-21 ENCOUNTER — Other Ambulatory Visit (INDEPENDENT_AMBULATORY_CARE_PROVIDER_SITE_OTHER): Payer: Federal, State, Local not specified - PPO

## 2017-10-21 DIAGNOSIS — E1169 Type 2 diabetes mellitus with other specified complication: Secondary | ICD-10-CM | POA: Diagnosis not present

## 2017-10-21 DIAGNOSIS — E669 Obesity, unspecified: Secondary | ICD-10-CM | POA: Diagnosis not present

## 2017-10-21 LAB — COMPREHENSIVE METABOLIC PANEL
ALT: 36 U/L (ref 0–53)
AST: 24 U/L (ref 0–37)
Albumin: 4 g/dL (ref 3.5–5.2)
Alkaline Phosphatase: 36 U/L — ABNORMAL LOW (ref 39–117)
BUN: 15 mg/dL (ref 6–23)
CHLORIDE: 99 meq/L (ref 96–112)
CO2: 29 meq/L (ref 19–32)
CREATININE: 1.18 mg/dL (ref 0.40–1.50)
Calcium: 9.4 mg/dL (ref 8.4–10.5)
GFR: 65.98 mL/min (ref >60.00)
GLUCOSE: 153 mg/dL — AB (ref 70–99)
Potassium: 4.3 mEq/L (ref 3.5–5.1)
SODIUM: 135 meq/L (ref 135–145)
Total Bilirubin: 0.6 mg/dL (ref 0.2–1.2)
Total Protein: 6.9 g/dL (ref 6.0–8.3)

## 2017-10-21 LAB — LIPID PANEL
CHOLESTEROL: 171 mg/dL (ref 0–200)
HDL: 64.5 mg/dL (ref >39.00)
LDL Cholesterol: 79 mg/dL (ref 0–99)
NonHDL: 106.35
Total CHOL/HDL Ratio: 3
Triglycerides: 139 mg/dL (ref 0.0–149.0)
VLDL: 27.8 mg/dL (ref 0.0–40.0)

## 2017-10-21 LAB — HEMOGLOBIN A1C: Hgb A1c MFr Bld: 7.1 % — ABNORMAL HIGH (ref 4.6–6.5)

## 2017-10-22 ENCOUNTER — Other Ambulatory Visit: Payer: Self-pay | Admitting: Endocrinology

## 2017-10-22 ENCOUNTER — Encounter: Payer: Self-pay | Admitting: Endocrinology

## 2017-10-22 ENCOUNTER — Ambulatory Visit: Payer: Federal, State, Local not specified - PPO | Admitting: Endocrinology

## 2017-10-22 VITALS — BP 142/82 | HR 90 | Ht 71.0 in | Wt 294.4 lb

## 2017-10-22 DIAGNOSIS — E7800 Pure hypercholesterolemia, unspecified: Secondary | ICD-10-CM

## 2017-10-22 DIAGNOSIS — I10 Essential (primary) hypertension: Secondary | ICD-10-CM | POA: Diagnosis not present

## 2017-10-22 DIAGNOSIS — E1165 Type 2 diabetes mellitus with hyperglycemia: Secondary | ICD-10-CM

## 2017-10-22 MED ORDER — SEMAGLUTIDE(0.25 OR 0.5MG/DOS) 2 MG/1.5ML ~~LOC~~ SOPN: 0.5000 mg | PEN_INJECTOR | SUBCUTANEOUS | 2 refills | Status: DC

## 2017-10-22 NOTE — Progress Notes (Signed)
Patient ID: Evan [name], male   DOB: 12-03-1955, 62 y.o.   MRN: [id number]          Reason for Appointment: Follow-up for Type 2 Diabetes  Referring physician: Danise [name]   History of Present Illness:          Date of diagnosis of type 2 diabetes mellitus: 2012        Background history:   Diagnosed incidentally with a high blood sugar Has been on metformin since diagnosis and subsequently Amaryl had been added He has had sporadic follow-up for the last few years His A1c had been below 7 to about 08/2015 Subsequently in 9/17 his A1c had gone up to 8%  Recent history:      Non-insulin hypoglycemic drugs the patient is taking are: Farxiga 10 mg, Januvia 50 mg daily, Amaryl 4 mg in the a.m., metformin 1 g twice a day  His A1c is slightly higher at 7.1 compared to 6.8 previously   Current management, blood sugar patterns and problems identified:  He still has relatively high fasting readings  Although he has been walking and getting 10,000 steps a day recently he has not lost much weight  He takes his Amaryl in the morning even though he tends to have high readings early morning and was previously advised to take it in the evening  Blood sugars are still highest in the mornings although checking only occasional readings before and after supper, did not have his meter on the last visit for comparison  He is trying to do intermittent fasting on a couple of days a week but he does not think this changes his blood sugars are has helped with his weight  However blood sugars are still better than before with using Farxiga  No hypoglycemia with lowest blood sugar around 7 PM        Side effects from medications have been: None   Glucose monitoring:  done 1 times a day         Glucometer: One Touch Verio .      Blood Glucose readings by download  Mean values apply above for all meters except median for One Touch  PRE-MEAL Fasting Lunch 5-7 PM Bedtime Overall  Glucose range:   144-204   81-183  102-147  81-204  Mean/median:  160  130    145+/-26    Self-care: The diet that the patient has been following is: tries to limit [employer], Carbohydrates and avoiding drinks with sugar .      Typical meal intake: Breakfast is usually Eggs and cheese.  Lunch is usually [place] yogurt.  Dinner: meat and vegetables    His snacks are fruits like apples and nuts             Dietician visit, most recent: 02/2017               Exercise:  walking throughout the day, 10k  Weight history:  Wt Readings from Last 3 Encounters:  10/22/17 294 lb 6.4 oz (133.5 kg)  10/07/17 292 lb 8 oz (132.7 kg)  05/19/17 294 lb 3.2 oz (133.4 kg)    Glycemic control:   Lab Results  Component Value Date   HGBA1C 7.1 (H) 10/21/2017   HGBA1C 6.8 (H) 05/17/2017   HGBA1C 11.5 (H) 12/15/2016   Lab Results  Component Value Date   MICROALBUR <0.7 05/17/2017   LDLCALC 79 10/21/2017   CREATININE 1.18 10/21/2017   Lab Results  Component Value Date  MICRALBCREAT 1.5 05/17/2017    Lab Results  Component Value Date   FRUCTOSAMINE 268 03/12/2017      Allergies as of 10/22/2017      Reactions   Penicillins    Respiratory distress as infant      Medication List        Accurate as of 10/22/17  1:47 PM. Always use your most recent med list.          aspirin 81 MG tablet Take 81 mg by mouth daily.   dapagliflozin propanediol 10 MG Tabs tablet Commonly known as:  FARXIGA Take 10 mg by mouth daily.   desonide 0.05 % cream Commonly known as:  DESOWEN Apply topically 2 (two) times daily. Apply to AA.   glimepiride 4 MG tablet Commonly known as:  AMARYL TAKE 1 TABLET (4 MG TOTAL) BY MOUTH DAILY BEFORE BREAKFAST.   glucose blood test strip Commonly known as:  ONETOUCH VERIO Use to check blood sugars 2 times daily   ibuprofen 200 MG tablet Commonly known as:  ADVIL,MOTRIN Take 200 mg by mouth at bedtime as needed.   losartan 100 MG tablet Commonly known as:  COZAAR TAKE 1  TABLET BY MOUTH EVERY DAY   metFORMIN 1000 MG tablet Commonly known as:  GLUCOPHAGE Take 1 tablet (1,000 mg total) by mouth 2 (two) times daily with a meal.   omeprazole 40 MG capsule Commonly known as:  PRILOSEC TAKE ONE CAPSULE BY MOUTH EVERY DAY AS NEEDED   ONETOUCH VERIO FLEX SYSTEM w/Device Kit 1 kit by Does not apply route daily. Use to check blood sugars 2 times daily   pravastatin 40 MG tablet Commonly known as:  PRAVACHOL Take 1 tablet (40 mg total) by mouth daily.   sildenafil 20 MG tablet Commonly known as:  REVATIO Take 2-5 tablets (40-100 mg total) by mouth daily as needed (relations).   sitaGLIPtin 100 MG tablet Commonly known as:  JANUVIA Take 1 tablet (100 mg total) by mouth daily.   Turmeric 500 MG Caps Take by mouth.   Vitamin B12 3000 MCG Subl Place 3,000 mcg under the tongue.   Vitamin D3 1000 units Caps Take 1 capsule (1,000 Units total) by mouth daily.       Allergies:  Allergies  Allergen Reactions  . Penicillins     Respiratory distress as infant    Past Medical History:  Diagnosis Date  . Depression    intolerant of cymbalta and wellbutrin  . Diverticulosis of colon   . Esophagitis 03/2015   by EGD  . Gastritis 03/2015   by EGD - mild, chronic  . Hyperlipidemia   . Hypertension   . Lichen simplex chronicus 05/2015   R scrotum Evan [name])  . Murmur    aortic sclerosis, SBE  prophlaxis not needed as per cardio  . NAFLD (nonalcoholic fatty liver disease) 01/2015   by [place]  . Ophthalmic migraine    with slurred speech (Evan [name])  . T2DM (type 2 diabetes mellitus) (Evan [name])    DSME 09/2013    Past Surgical History:  Procedure Laterality Date  . COLONOSCOPY  2006   diverticulosis per pt  . COLONOSCOPY  03/2015   2 polyps, rpt 5 yrs Evan [name])  . ESOPHAGOGASTRODUODENOSCOPY  03/2015   mild chronic gastritis, esophagitis   . LUMBAR LAMINECTOMY  1989   L5-S1    Family History  Problem Relation Age of Onset  . Alzheimer's disease Father    . Hypertension Father   . Stroke Father   .  Hyperlipidemia Father   . Coronary artery disease Brother 62       diabetic, MI  . Prostate cancer Brother   . Diabetes Brother   . Coronary artery disease Maternal Uncle   . Schizophrenia Paternal Uncle   . Emphysema Mother     Social History:  reports that he has never smoked. He has never used smokeless tobacco. He reports that he drinks alcohol. He reports that he does not use drugs.   Review of Systems  Lipid history: On pravastatin 40 mg since his last visit, this was increased because of LDL over 100 LDL is improved     Lab Results  Component Value Date   CHOL 171 10/21/2017   HDL 64.50 10/21/2017   LDLCALC 79 10/21/2017   LDLDIRECT 99.0 05/20/2015   TRIG 139.0 10/21/2017   CHOLHDL 3 10/21/2017           Hypertension: On Losartan 1/2 Tablet with good control    FATTY liver: His ALT is again normal     Most recent foot exam: 8/18    LABS:  Lab on 10/21/2017  Component Date Value Ref Range Status  . Cholesterol 10/21/2017 171  0 - 200 mg/dL Final   ATP III Classification       Desirable:  < 200 mg/dL               Borderline High:  200 - 239 mg/dL          High:  > = 240 mg/dL  . Triglycerides 10/21/2017 139.0  0.0 - 149.0 mg/dL Final   Normal:  <150 mg/dLBorderline High:  150 - 199 mg/dL  . HDL 10/21/2017 64.50  >39.00 mg/dL Final  . VLDL 10/21/2017 27.8  0.0 - 40.0 mg/dL Final  . LDL Cholesterol 10/21/2017 79  0 - 99 mg/dL Final  . Total CHOL/HDL Ratio 10/21/2017 3   Final                  Men          Women1/2 Average Risk     3.4          3.3Average Risk          5.0          4.42X Average Risk          9.6          7.13X Average Risk          15.0          11.0                      . NonHDL 10/21/2017 106.35   Final   NOTE:  Non-HDL goal should be 30 mg/dL higher than patient's LDL goal (i.e. LDL goal of < 70 mg/dL, would have non-HDL goal of < 100 mg/dL)  . Sodium 10/21/2017 135  135 - 145 mEq/L Final    . Potassium 10/21/2017 4.3  3.5 - 5.1 mEq/L Final  . Chloride 10/21/2017 99  96 - 112 mEq/L Final  . CO2 10/21/2017 29  19 - 32 mEq/L Final  . Glucose, Bld 10/21/2017 153* 70 - 99 mg/dL Final  . BUN 10/21/2017 15  6 - 23 mg/dL Final  . Creatinine, Ser 10/21/2017 1.18  0.40 - 1.50 mg/dL Final  . Total Bilirubin 10/21/2017 0.6  0.2 - 1.2 mg/dL Final  . Alkaline Phosphatase 10/21/2017 36* 39 - 117 U/L Final  .  AST 10/21/2017 24  0 - 37 U/L Final  . ALT 10/21/2017 36  0 - 53 U/L Final  . Total Protein 10/21/2017 6.9  6.0 - 8.3 g/dL Final  . Albumin 10/21/2017 4.0  3.5 - 5.2 g/dL Final  . Calcium 10/21/2017 9.4  8.4 - 10.5 mg/dL Final  . GFR 10/21/2017 65.98  >60.00 mL/min Final  . Hgb A1c MFr Bld 10/21/2017 7.1* 4.6 - 6.5 % Final   Glycemic Control Guidelines for People with Diabetes:Non Diabetic:  <6%Goal of Therapy: <7%Additional Action Suggested:  >8%     Physical Examination:  BP (!) 142/82 (BP Location: Left Arm, Patient Position: Sitting, Cuff Size: Large)   Pulse 90   Ht _0  (1.803 m)   Wt 294 lb 6.4 oz (133.5 kg)   SpO2 98%   BMI 41.06 kg/m   No pedal edema present  ASSESSMENT:  Diabetes type 2, BMI 41  See history of present illness for detailed discussion of current diabetes management, blood sugar patterns and problems identified     His A1c has gone up to 7.1  This is with a 4 drug regimen including [place] Glade He has difficulty with fasting hyperglycemia and occasionally high after meals Also has not been able to lose weight despite making more efforts He is also starting to walk more He does need weight loss and better diabetes control overall especially fasting hyperglycemia He is a good candidate for a GLP-1 drug although he is concerned about the cost  HYPERTENSION: Fairly well controlled controlled  HYPERLIPIDEMIA: His LDL is better with increased pravastatin Triglycerides are also normal He will continue 40 mg  Fatty liver: His liver functions are  now consistently normal with better blood sugar control and some weight loss   PLAN:   Discussed with the patient the nature of GLP-1 drugs, the action on various organ systems, how they benefit blood glucose control, as well as the benefit of weight loss and  increase satiety .  Explained possible side effects, particularly nausea and vomiting that usually resolve over time; discussed safety information in package insert.  Demonstrated the medication injection device for OZEMPIC and injection technique to the patient.  Showed patient where to inject the medication. To start with 0. 25 mg dosage weekly for the first 4 weeks and then 0.5 mg  Patient brochure on Ozempic with enclosed co-pay card given, prior authorization will be done if needed  He will stop Januvia after 2 weeks and move the 2 mg Amaryl to suppertime Instructions given on stopping the Amaryl if blood sugars are below 90  Follow-up in 6 weeks with fructosamine for reassessment  Counseling time on subjects discussed in assessment and plan sections is over 50% of today's 25 minute visit   There are no Patient Instructions on file for this visit.    Elayne Snare 10/22/2017, 1:47 PM   Note: This office note was prepared with Dragon voice recognition system technology. Any transcriptional errors that result from this process are unintentional.

## 2017-10-22 NOTE — Patient Instructions (Addendum)
Start OZEMPIC 0.25mg  using the pen as shown once weekly on the same day of the week.   You may inject in the stomach, thigh or arm as indicated in the brochure given. If you have any difficulties using the pen see the video at CompPlans.co.za  You will feel fullness of the stomach with starting the medication and should try to keep the portions at meals small.  You may experience nausea in the first few days which usually gets better over time    If you have any questions or concerns please call the office   You may also talk to a nurse educator with [employer] at [phone] Useful website: Ozempicsupport.com   GLIMEPIRIDE: Change this to half a pill at suppertime and not in the morning If the blood sugars start getting below 90 may stop the medication completely  Stop JANUVIA after shot # 2

## 2017-10-27 ENCOUNTER — Other Ambulatory Visit: Payer: Self-pay

## 2017-10-27 MED ORDER — SITAGLIPTIN PHOSPHATE 100 MG PO TABS: 100.0000 mg | ORAL_TABLET | Freq: Every day | ORAL | 3 refills | Status: DC

## 2017-11-07 ENCOUNTER — Other Ambulatory Visit: Payer: Self-pay | Admitting: Endocrinology

## 2017-11-09 ENCOUNTER — Telehealth: Payer: Self-pay | Admitting: Endocrinology

## 2017-11-09 NOTE — Telephone Encounter (Signed)
-----   Message from Elayne Snare, MD sent at 11/08/2017  5:01 PM EDT ----- Regarding: No appointment He was supposed to see me back 6 weeks after his last appointment in early May with labs but there is nothing scheduled.  Please set this up

## 2017-11-11 ENCOUNTER — Other Ambulatory Visit: Payer: Self-pay | Admitting: Family Medicine

## 2017-11-18 ENCOUNTER — Other Ambulatory Visit: Payer: Self-pay | Admitting: Family Medicine

## 2017-11-18 DIAGNOSIS — E1165 Type 2 diabetes mellitus with hyperglycemia: Secondary | ICD-10-CM

## 2017-11-18 DIAGNOSIS — E7800 Pure hypercholesterolemia, unspecified: Secondary | ICD-10-CM

## 2017-11-19 ENCOUNTER — Other Ambulatory Visit: Payer: Federal, State, Local not specified - PPO

## 2017-11-23 ENCOUNTER — Ambulatory Visit (INDEPENDENT_AMBULATORY_CARE_PROVIDER_SITE_OTHER): Payer: Federal, State, Local not specified - PPO | Admitting: Family Medicine

## 2017-11-23 ENCOUNTER — Encounter: Payer: Self-pay | Admitting: Family Medicine

## 2017-11-23 VITALS — BP 136/78 | HR 70 | Temp 98.2°F | Ht 70.5 in | Wt 285.0 lb

## 2017-11-23 DIAGNOSIS — E118 Type 2 diabetes mellitus with unspecified complications: Secondary | ICD-10-CM | POA: Diagnosis not present

## 2017-11-23 DIAGNOSIS — N529 Male erectile dysfunction, unspecified: Secondary | ICD-10-CM

## 2017-11-23 DIAGNOSIS — K760 Fatty (change of) liver, not elsewhere classified: Secondary | ICD-10-CM | POA: Diagnosis not present

## 2017-11-23 DIAGNOSIS — Z6841 Body Mass Index (BMI) 40.0 and over, adult: Secondary | ICD-10-CM

## 2017-11-23 DIAGNOSIS — F3342 Major depressive disorder, recurrent, in full remission: Secondary | ICD-10-CM

## 2017-11-23 DIAGNOSIS — I700 Atherosclerosis of aorta: Secondary | ICD-10-CM | POA: Diagnosis not present

## 2017-11-23 DIAGNOSIS — I10 Essential (primary) hypertension: Secondary | ICD-10-CM | POA: Diagnosis not present

## 2017-11-23 DIAGNOSIS — R011 Cardiac murmur, unspecified: Secondary | ICD-10-CM

## 2017-11-23 DIAGNOSIS — Z0000 Encounter for general adult medical examination without abnormal findings: Secondary | ICD-10-CM | POA: Diagnosis not present

## 2017-11-23 DIAGNOSIS — M25562 Pain in left knee: Secondary | ICD-10-CM

## 2017-11-23 DIAGNOSIS — G4733 Obstructive sleep apnea (adult) (pediatric): Secondary | ICD-10-CM | POA: Diagnosis not present

## 2017-11-23 DIAGNOSIS — E7800 Pure hypercholesterolemia, unspecified: Secondary | ICD-10-CM | POA: Diagnosis not present

## 2017-11-23 DIAGNOSIS — K2100 Gastro-esophageal reflux disease with esophagitis, without bleeding: Secondary | ICD-10-CM

## 2017-11-23 DIAGNOSIS — G8929 Other chronic pain: Secondary | ICD-10-CM

## 2017-11-23 MED ORDER — SILDENAFIL CITRATE 20 MG PO TABS: 40.0000 mg | ORAL_TABLET | Freq: Every day | ORAL | 3 refills | Status: DC | PRN

## 2017-11-23 NOTE — Assessment & Plan Note (Signed)
Congratulated on weight loss to date doing intermittent fasting. Pt motivated to continue.

## 2017-11-23 NOTE — Patient Instructions (Addendum)
If interested, check with pharmacy about new 2 shot shingles series (shingrix).  Schedule eye exam if due.  Consider heart ultrasound in near future.  You are doing well today Return as needed or in 6 months for follow up visit.  We will refer you to PT for left knee  Health Maintenance, Male A healthy lifestyle and preventive care is important for your health and wellness. Ask your health care provider about what schedule of regular examinations is right for you. What should I know about weight and diet? Eat a Healthy Diet  Eat plenty of vegetables, fruits, whole grains, low-fat dairy products, and lean protein.  Do not eat a lot of foods high in solid fats, added sugars, or salt.  Maintain a Healthy Weight Regular exercise can help you achieve or maintain a healthy weight. You should:  Do at least 150 minutes of exercise each week. The exercise should increase your heart rate and make you sweat (moderate-intensity exercise).  Do strength-training exercises at least twice a week.  Watch Your Levels of Cholesterol and Blood Lipids  Have your blood tested for lipids and cholesterol every 5 years starting at 64 years of age. If you are at high risk for heart disease, you should start having your blood tested when you are 64 years old. You may need to have your cholesterol levels checked more often if: ? Your lipid or cholesterol levels are high. ? You are older than 64 years of age. ? You are at high risk for heart disease.  What should I know about cancer screening? Many types of cancers can be detected early and may often be prevented. Lung Cancer  You should be screened every year for lung cancer if: ? You are a current smoker who has smoked for at least 30 years. ? You are a former smoker who has quit within the past 15 years.  Talk to your health care provider about your screening options, when you should start screening, and how often you should be screened.  Colorectal  Cancer  Routine colorectal cancer screening usually begins at 64 years of age and should be repeated every 5-10 years until you are 64 years old. You may need to be screened more often if early forms of precancerous polyps or small growths are found. Your health care provider may recommend screening at an earlier age if you have risk factors for colon cancer.  Your health care provider may recommend using home test kits to check for hidden blood in the stool.  A small camera at the end of a tube can be used to examine your colon (sigmoidoscopy or colonoscopy). This checks for the earliest forms of colorectal cancer.  Prostate and Testicular Cancer  Depending on your age and overall health, your health care provider may do certain tests to screen for prostate and testicular cancer.  Talk to your health care provider about any symptoms or concerns you have about testicular or prostate cancer.  Skin Cancer  Check your skin from head to toe regularly.  Tell your health care provider about any new moles or changes in moles, especially if: ? There is a change in a mole's size, shape, or color. ? You have a mole that is larger than a pencil eraser.  Always use sunscreen. Apply sunscreen liberally and repeat throughout the day.  Protect yourself by wearing long sleeves, pants, a wide-brimmed hat, and sunglasses when outside.  What should I know about heart disease, diabetes, and high  blood pressure?  If you are 70-10 years of age, have your blood pressure checked every 3-5 years. If you are 67 years of age or older, have your blood pressure checked every year. You should have your blood pressure measured twice-once when you are at a hospital or clinic, and once when you are not at a hospital or clinic. Record the average of the two measurements. To check your blood pressure when you are not at a hospital or clinic, you can use: ? An automated blood pressure machine at a pharmacy. ? A home blood  pressure monitor.  Talk to your health care provider about your target blood pressure.  If you are between 20-49 years old, ask your health care provider if you should take aspirin to prevent heart disease.  Have regular diabetes screenings by checking your fasting blood sugar level. ? If you are at a normal weight and have a low risk for diabetes, have this test once every three years after the age of 54. ? If you are overweight and have a high risk for diabetes, consider being tested at a younger age or more often.  A one-time screening for abdominal aortic aneurysm (AAA) by ultrasound is recommended for men aged 85-75 years who are current or former smokers. What should I know about preventing infection? Hepatitis B If you have a higher risk for hepatitis B, you should be screened for this virus. Talk with your health care provider to find out if you are at risk for hepatitis B infection. Hepatitis C Blood testing is recommended for:  Everyone born from [birth year] through 1967.  Anyone with known risk factors for hepatitis C.  Sexually Transmitted Diseases (STDs)  You should be screened each year for STDs including gonorrhea and chlamydia if: ? You are sexually active and are younger than 64 years of age. ? You are older than 64 years of age and your health care provider tells you that you are at risk for this type of infection. ? Your sexual activity has changed since you were last screened and you are at an increased risk for chlamydia or gonorrhea. Ask your health care provider if you are at risk.  Talk with your health care provider about whether you are at high risk of being infected with HIV. Your health care provider may recommend a prescription medicine to help prevent HIV infection.  What else can I do?  Schedule regular health, dental, and eye exams.  Stay current with your vaccines (immunizations).  Do not use any tobacco products, such as cigarettes, chewing tobacco, and  e-cigarettes. If you need help quitting, ask your health care provider.  Limit alcohol intake to no more than 2 drinks per day. One drink equals 12 ounces of beer, 5 ounces of wine, or 1 ounces of hard liquor.  Do not use street drugs.  Do not share needles.  Ask your health care provider for help if you need support or information about quitting drugs.  Tell your health care provider if you often feel depressed.  Tell your health care provider if you have ever been abused or do not feel safe at home. This information is not intended to replace advice given to you by your health care provider. Make sure you discuss any questions you have with your health care provider. Document Released: 11/28/2007 Document Revised: 01/29/2016 Document Reviewed: 03/05/2015 Elsevier Interactive Patient Education  [employer].

## 2017-11-23 NOTE — Assessment & Plan Note (Signed)
Chronic, stable. Continue pravastatin. The 10-year ASCVD risk score Mikey Bussing DC Brooke Bonito., et al., 2013) is: 21.3%   Values used to calculate the score:     Age: 62 years     Sex: Male     Is Non-Hispanic African American: No     Diabetic: Yes     Tobacco smoker: No     Systolic Blood Pressure: 454 mmHg     Is BP treated: Yes     HDL Cholesterol: 64.5 mg/dL     Total Cholesterol: 171 mg/dL

## 2017-11-23 NOTE — Assessment & Plan Note (Addendum)
Pharmacy has not filled generic sildenafil - will re send. Unclear cause of delay. Pt aware will have to pay out of pocket.

## 2017-11-23 NOTE — Assessment & Plan Note (Signed)
Chronic, stable. Continue current regimen. 

## 2017-11-23 NOTE — Assessment & Plan Note (Signed)
Preventative protocols reviewed and updated unless pt declined. Discussed healthy diet and lifestyle.  

## 2017-11-23 NOTE — Assessment & Plan Note (Signed)
With hepatomegaly - consider updated imaging next year.

## 2017-11-23 NOTE — Assessment & Plan Note (Signed)
Appreciate endo care. Reviewed new regimen. Has scheduled f/u with Dr Dwyane Dee next month.

## 2017-11-23 NOTE — Assessment & Plan Note (Signed)
H/o esophagitis and gastritis on EGD 2016 [name]). No recurrent symptoms in years. Discussed risks of long term PPI use. Suggested QOD dosing trial.  Check b12 next labs given longstanding metformin and PPI use.

## 2017-11-23 NOTE — Assessment & Plan Note (Signed)
Stable period off antidepressant 

## 2017-11-23 NOTE — Assessment & Plan Note (Addendum)
By CT - continue aspirin, statin.

## 2017-11-23 NOTE — Assessment & Plan Note (Signed)
Normal home sleep study 03/2016 ([place]) - no need for CPAP

## 2017-11-23 NOTE — Progress Notes (Signed)
BP 136/78 (BP Location: Left Arm, Patient Position: Sitting, Cuff Size: Large)   Pulse 70   Temp 98.2 F (36.8 C) (Oral)   Ht 5' 10.5" (1.791 m)   Wt 285 lb (129.3 kg)   SpO2 95%   BMI 40.32 kg/m    CC: CPE Subjective:    Patient ID: Evan [name], male    DOB: 1956-04-17, 62 y.o.   MRN: [id number]  HPI: Evan [name] is a 62 y.o. male presenting on 11/23/2017 for Annual Exam   10 lb weight loss! - doing intermittent fasting Mon and Thurs (supper to supper).   DM - established with Dr Dwyane Dee - next appt 12/17/2017. Continues farxiga 8m, amaryl 467m(at night), and metformin 100080mid. Recently started on ozempic injectable medication. cbg 179 this morning - am sugars tend to run high.   Ongoing L knee pain although improved from 09/2017 - would like PT referral.   Preventative: COLONOSCOPY Date: 03/2015 2 polyps, rpt 5 yrs ([facility]OPHAGOGASTRODUODENOSCOPY Date: 03/2015 mild chronic gastritis, esophagitis - on daily PPI  Prostate cancer screening - gets PSA done every few years. Brother with prostate cancer.  Flu shot yearly Tdap 04/2012  Pneumovax 12/2012  Zostavax - declines. Has had recurrent shingles in the past Shingrix - discussed.  Seat belt use discussed.  Sunscreen use discussed. No changing moles on skin.  Non smoker Alcohol - 2 drinks every other day Dentist - Q6 months Eye exam - yearly - due  Caffeine: 3 cups coffee/day  Lives with wife and daughter, son in college, 2 cats and 1 dog  Occupation: [employer]  Edu: masters degree  Activity: walks [id number]  Diet: good water, fruits/vegetables daily. Keeps record of caloric intake  Relevant past medical, surgical, family and social history reviewed and updated as indicated. Interim medical history since our last visit reviewed. Allergies and medications reviewed and updated. Outpatient Medications Prior to Visit  Medication Sig Dispense Refill  . aspirin 81 MG tablet Take 81  mg by mouth daily.      . Blood Glucose Monitoring Suppl (ONETOUCH VERIO FLEX SYSTEM) w/Device KIT 1 kit by Does not apply route daily. Use to check blood sugars 2 times daily 1 kit 2  . Cholecalciferol (VITAMIN D3) 1000 units CAPS Take 1 capsule (1,000 Units total) by mouth daily. 30 capsule   . Cyanocobalamin (VITAMIN B12) 3000 MCG SUBL Place 3,000 mcg under the tongue.    . deMarland Kitchenonide (DESOWEN) 0.05 % cream Apply topically 2 (two) times daily. Apply to AA. 30 g 0  . FARXIGA 10 MG TABS tablet TAKE 1 TABLET BY MOUTH EVERY DAY 30 tablet 0  . glimepiride (AMARYL) 4 MG tablet TAKE 1 TABLET (4 MG TOTAL) BY MOUTH DAILY BEFORE BREAKFAST. 30 tablet 2  . glucose blood (ONETOUCH VERIO) test strip Use to check blood sugars 2 times daily 200 each 3  . ibuprofen (ADVIL,MOTRIN) 200 MG tablet Take 200 mg by mouth at bedtime as needed.    . loMarland Kitchenartan (COZAAR) 100 MG tablet TAKE 1 TABLET BY MOUTH EVERY DAY 90 tablet 0  . metFORMIN (GLUCOPHAGE) 1000 MG tablet Take 1 tablet (1,000 mg total) by mouth 2 (two) times daily with a meal. 180 tablet 1  . omeprazole (PRILOSEC) 40 MG capsule TAKE ONE CAPSULE BY MOUTH EVERY DAY AS NEEDED 90 capsule 3  . OZEMPIC 0.25 or 0.5 MG/DOSE SOPN INJECT 0.5 MG INTO THE SKIN ONCE A WEEK. START WITH 0.25 MG WEEKLY FOR  THE FIRST 4 WEEKS 2 pen 2  . pravastatin (PRAVACHOL) 40 MG tablet Take 1 tablet (40 mg total) by mouth daily. 90 tablet 3  . Turmeric 500 MG CAPS Take by mouth.    . sildenafil (REVATIO) 20 MG tablet Take 2-5 tablets (40-100 mg total) by mouth daily as needed (relations). 30 tablet 3  . sitaGLIPtin (JANUVIA) 100 MG tablet Take 1 tablet (100 mg total) by mouth daily. 30 tablet 3   No facility-administered medications prior to visit.      Per HPI unless specifically indicated in ROS section below Review of Systems  Constitutional: Negative for activity change, appetite change, chills, fatigue, fever and unexpected weight change.  HENT: Negative for hearing loss.   Eyes:  Negative for visual disturbance.  Respiratory: Negative for cough, chest tightness, shortness of breath and wheezing.   Cardiovascular: Negative for chest pain, palpitations and leg swelling.  Gastrointestinal: Negative for abdominal distention, abdominal pain, blood in stool, constipation, diarrhea, nausea and vomiting.  Genitourinary: Negative for difficulty urinating and hematuria.  Musculoskeletal: Negative for arthralgias, myalgias and neck pain.  Skin: Negative for rash.  Neurological: Negative for dizziness, seizures, syncope and headaches.  Hematological: Negative for adenopathy. Does not bruise/bleed easily.  Psychiatric/Behavioral: Negative for dysphoric mood. The patient is not nervous/anxious.        Objective:    BP 136/78 (BP Location: Left Arm, Patient Position: Sitting, Cuff Size: Large)   Pulse 70   Temp 98.2 F (36.8 C) (Oral)   Ht 5' 10.5" (1.791 m)   Wt 285 lb (129.3 kg)   SpO2 95%   BMI 40.32 kg/m   Wt Readings from Last 3 Encounters:  11/23/17 285 lb (129.3 kg)  10/22/17 294 lb 6.4 oz (133.5 kg)  10/07/17 292 lb 8 oz (132.7 kg)    Physical Exam  Constitutional: He is oriented to person, place, and time. He appears well-developed and well-nourished. No distress.  HENT:  Head: Normocephalic and atraumatic.  Right Ear: Hearing, tympanic membrane, external ear and ear canal normal.  Left Ear: Hearing, tympanic membrane, external ear and ear canal normal.  Nose: Nose normal.  Mouth/Throat: Uvula is midline, oropharynx is clear and moist and mucous membranes are normal. No oropharyngeal exudate, posterior oropharyngeal edema or posterior oropharyngeal erythema.  Eyes: Pupils are equal, round, and reactive to light. Conjunctivae and EOM are normal. No scleral icterus.  Neck: Normal range of motion. Neck supple. Carotid bruit is present (referred). No thyromegaly present.  Cardiovascular: Normal rate, regular rhythm and intact distal pulses.  Murmur (4/6 systolic  throughout) heard. Pulses:      Radial pulses are 2+ on the right side, and 2+ on the left side.  Pulmonary/Chest: Effort normal and breath sounds normal. No respiratory distress. He has no wheezes. He has no rales.  Abdominal: Soft. Bowel sounds are normal. He exhibits no distension and no mass. There is no tenderness. There is no rebound and no guarding.  Genitourinary: Rectum normal. Rectal exam shows no external hemorrhoid, no internal hemorrhoid, no fissure, no mass, no tenderness and anal tone normal. Prostate is enlarged (30gm). Prostate is not tender.  Musculoskeletal: Normal range of motion. He exhibits no edema.  Lymphadenopathy:    He has no cervical adenopathy.  Neurological: He is alert and oriented to person, place, and time.  CN grossly intact, station and gait intact  Skin: Skin is warm and dry. No rash noted.  Psychiatric: He has a normal mood and affect. His behavior is  normal. Judgment and thought content normal.  Nursing note and vitals reviewed.  Results for orders placed or performed in visit on 10/21/17  Lipid panel  Result Value Ref Range   Cholesterol 171 0 - 200 mg/dL   Triglycerides 139.0 0.0 - 149.0 mg/dL   HDL 64.50 >39.00 mg/dL   VLDL 27.8 0.0 - 40.0 mg/dL   LDL Cholesterol 79 0 - 99 mg/dL   Total CHOL/HDL Ratio 3    NonHDL 106.35   Comprehensive metabolic panel  Result Value Ref Range   Sodium 135 135 - 145 mEq/L   Potassium 4.3 3.5 - 5.1 mEq/L   Chloride 99 96 - 112 mEq/L   CO2 29 19 - 32 mEq/L   Glucose, Bld 153 (H) 70 - 99 mg/dL   BUN 15 6 - 23 mg/dL   Creatinine, Ser 1.18 0.40 - 1.50 mg/dL   Total Bilirubin 0.6 0.2 - 1.2 mg/dL   Alkaline Phosphatase 36 (L) 39 - 117 U/L   AST 24 0 - 37 U/L   ALT 36 0 - 53 U/L   Total Protein 6.9 6.0 - 8.3 g/dL   Albumin 4.0 3.5 - 5.2 g/dL   Calcium 9.4 8.4 - 10.5 mg/dL   GFR 65.98 >60.00 mL/min  Hemoglobin A1c  Result Value Ref Range   Hgb A1c MFr Bld 7.1 (H) 4.6 - 6.5 %   Lab Results  Component Value  Date   PSA 0.46 10/07/2017   PSA 0.55 09/10/2015   PSA 0.63 03/26/2014    Lab Results  Component Value Date   VITAMINB12 399 12/15/2016       Assessment & Plan:   Problem List Items Addressed This Visit    Systolic murmur    H/o aortic sclerosis. No recent echo. Encouraged updated echocardiogram - he will consider this.       OSA (obstructive sleep apnea)    Normal home sleep study 03/2016 ([place]) - no need for CPAP      Morbid obesity with BMI of 40.0-44.9, adult ([place])    Congratulated on weight loss to date doing intermittent fasting. Pt motivated to continue.       MDD (major depressive disorder), recurrent, in full remission ([place])    Stable period off antidepressant.       Hypercholesterolemia    Chronic, stable. Continue pravastatin. The 10-year ASCVD risk score Mikey Bussing DC Brooke Bonito., et al., 2013) is: 21.3%   Values used to calculate the score:     Age: 89 years     Sex: Male     Is Non-Hispanic African American: No     Diabetic: Yes     Tobacco smoker: No     Systolic Blood Pressure: 116 mmHg     Is BP treated: Yes     HDL Cholesterol: 64.5 mg/dL     Total Cholesterol: 171 mg/dL       Relevant Medications   sildenafil (REVATIO) 20 MG tablet   Hepatic steatosis    With hepatomegaly - consider updated imaging next year.       Healthcare maintenance - Primary    Preventative protocols reviewed and updated unless pt declined. Discussed healthy diet and lifestyle.       GERD (gastroesophageal reflux disease)    H/o esophagitis and gastritis on EGD 2016 [name]). No recurrent symptoms in years. Discussed risks of long term PPI use. Suggested QOD dosing trial.  Check b12 next labs given longstanding metformin and PPI use.  Essential hypertension    Chronic, stable. Continue current regimen.       Relevant Medications   sildenafil (REVATIO) 20 MG tablet   ERECTILE DYSFUNCTION, ORGANIC    Pharmacy has not filled generic sildenafil - will re send. Unclear  cause of delay. Pt aware will have to pay out of pocket.      Controlled diabetes mellitus type 2 with complications ([place])    Appreciate endo care. Reviewed new regimen. Has scheduled f/u with Dr Dwyane Dee next month.      Chronic pain of left knee    Xray showed DJD. Ongoing - requests PT referral. Consider steroid injection and/or ortho referral.       Relevant Orders   Ambulatory referral to Physical Therapy   Aortic atherosclerosis ([place])    By CT - continue aspirin, statin.      Relevant Medications   sildenafil (REVATIO) 20 MG tablet       Meds ordered this encounter  Medications  . sildenafil (REVATIO) 20 MG tablet    Sig: Take 2-5 tablets (40-100 mg total) by mouth daily as needed (relations).    Dispense:  30 tablet    Refill:  3   Orders Placed This Encounter  Procedures  . Ambulatory referral to Physical Therapy    Referral Priority:   Routine    Referral Type:   Physical Medicine    Referral Reason:   Specialty Services Required    Requested Specialty:   Physical Therapy    Number of Visits Requested:   1    Follow up plan: Return in about 6 months (around 05/25/2018) for follow up visit.  Ria Bush, MD

## 2017-11-23 NOTE — Assessment & Plan Note (Addendum)
Xray showed DJD. Ongoing - requests PT referral. Consider steroid injection and/or ortho referral.

## 2017-11-23 NOTE — Assessment & Plan Note (Signed)
H/o aortic sclerosis. No recent echo. Encouraged updated echocardiogram - he will consider this.

## 2017-11-25 DIAGNOSIS — M25661 Stiffness of right knee, not elsewhere classified: Secondary | ICD-10-CM | POA: Diagnosis not present

## 2017-11-25 DIAGNOSIS — M25562 Pain in left knee: Secondary | ICD-10-CM | POA: Diagnosis not present

## 2017-11-25 DIAGNOSIS — M25561 Pain in right knee: Secondary | ICD-10-CM | POA: Diagnosis not present

## 2017-11-25 DIAGNOSIS — M25662 Stiffness of left knee, not elsewhere classified: Secondary | ICD-10-CM | POA: Diagnosis not present

## 2017-11-30 DIAGNOSIS — M25661 Stiffness of right knee, not elsewhere classified: Secondary | ICD-10-CM | POA: Diagnosis not present

## 2017-11-30 DIAGNOSIS — M25662 Stiffness of left knee, not elsewhere classified: Secondary | ICD-10-CM | POA: Diagnosis not present

## 2017-11-30 DIAGNOSIS — M25562 Pain in left knee: Secondary | ICD-10-CM | POA: Diagnosis not present

## 2017-11-30 DIAGNOSIS — M25561 Pain in right knee: Secondary | ICD-10-CM | POA: Diagnosis not present

## 2017-12-02 DIAGNOSIS — M25562 Pain in left knee: Secondary | ICD-10-CM | POA: Diagnosis not present

## 2017-12-02 DIAGNOSIS — M25661 Stiffness of right knee, not elsewhere classified: Secondary | ICD-10-CM | POA: Diagnosis not present

## 2017-12-02 DIAGNOSIS — M25662 Stiffness of left knee, not elsewhere classified: Secondary | ICD-10-CM | POA: Diagnosis not present

## 2017-12-02 DIAGNOSIS — M25561 Pain in right knee: Secondary | ICD-10-CM | POA: Diagnosis not present

## 2017-12-06 ENCOUNTER — Other Ambulatory Visit: Payer: Self-pay | Admitting: Endocrinology

## 2017-12-06 DIAGNOSIS — M25562 Pain in left knee: Secondary | ICD-10-CM | POA: Diagnosis not present

## 2017-12-06 DIAGNOSIS — M25661 Stiffness of right knee, not elsewhere classified: Secondary | ICD-10-CM | POA: Diagnosis not present

## 2017-12-06 DIAGNOSIS — M25662 Stiffness of left knee, not elsewhere classified: Secondary | ICD-10-CM | POA: Diagnosis not present

## 2017-12-06 DIAGNOSIS — M25561 Pain in right knee: Secondary | ICD-10-CM | POA: Diagnosis not present

## 2017-12-09 DIAGNOSIS — M25661 Stiffness of right knee, not elsewhere classified: Secondary | ICD-10-CM | POA: Diagnosis not present

## 2017-12-09 DIAGNOSIS — M25562 Pain in left knee: Secondary | ICD-10-CM | POA: Diagnosis not present

## 2017-12-09 DIAGNOSIS — M25561 Pain in right knee: Secondary | ICD-10-CM | POA: Diagnosis not present

## 2017-12-09 DIAGNOSIS — M25662 Stiffness of left knee, not elsewhere classified: Secondary | ICD-10-CM | POA: Diagnosis not present

## 2017-12-13 ENCOUNTER — Other Ambulatory Visit (INDEPENDENT_AMBULATORY_CARE_PROVIDER_SITE_OTHER): Payer: Federal, State, Local not specified - PPO

## 2017-12-13 DIAGNOSIS — E1165 Type 2 diabetes mellitus with hyperglycemia: Secondary | ICD-10-CM

## 2017-12-13 DIAGNOSIS — M25562 Pain in left knee: Secondary | ICD-10-CM | POA: Diagnosis not present

## 2017-12-13 DIAGNOSIS — M25662 Stiffness of left knee, not elsewhere classified: Secondary | ICD-10-CM | POA: Diagnosis not present

## 2017-12-13 DIAGNOSIS — M25661 Stiffness of right knee, not elsewhere classified: Secondary | ICD-10-CM | POA: Diagnosis not present

## 2017-12-13 DIAGNOSIS — M25561 Pain in right knee: Secondary | ICD-10-CM | POA: Diagnosis not present

## 2017-12-13 LAB — GLUCOSE, RANDOM: GLUCOSE: 127 mg/dL — AB (ref 70–99)

## 2017-12-14 LAB — FRUCTOSAMINE: Fructosamine: 239 umol/L (ref 0–285)

## 2017-12-15 DIAGNOSIS — M25562 Pain in left knee: Secondary | ICD-10-CM | POA: Diagnosis not present

## 2017-12-15 DIAGNOSIS — M25662 Stiffness of left knee, not elsewhere classified: Secondary | ICD-10-CM | POA: Diagnosis not present

## 2017-12-15 DIAGNOSIS — M25561 Pain in right knee: Secondary | ICD-10-CM | POA: Diagnosis not present

## 2017-12-15 DIAGNOSIS — M25661 Stiffness of right knee, not elsewhere classified: Secondary | ICD-10-CM | POA: Diagnosis not present

## 2017-12-17 ENCOUNTER — Encounter: Payer: Self-pay | Admitting: Endocrinology

## 2017-12-17 ENCOUNTER — Ambulatory Visit: Payer: Federal, State, Local not specified - PPO | Admitting: Endocrinology

## 2017-12-17 VITALS — BP 140/80 | HR 74 | Wt 287.0 lb

## 2017-12-17 DIAGNOSIS — E1165 Type 2 diabetes mellitus with hyperglycemia: Secondary | ICD-10-CM

## 2017-12-17 NOTE — Progress Notes (Signed)
Patient ID: Evan [name], male   DOB: 1955-11-26, 62 y.o.   MRN: [id number]          Reason for Appointment: Follow-up for Type 2 Diabetes   History of Present Illness:          Date of diagnosis of type 2 diabetes mellitus: 2012        Background history:   Diagnosed incidentally with a high blood sugar Has been on metformin since diagnosis and subsequently Amaryl had been added He has had sporadic follow-up for the last few years His A1c had been below 7 to about 08/2015 Subsequently in 9/17 his A1c had gone up to 8%  Recent history:      Non-insulin hypoglycemic drugs the patient is taking are: Farxiga 10 mg, Amaryl 2 mg in the pm., metformin 1 g twice a day, Ozempic 0.5 mg weekly  His A1c in 5/19 was slightly higher at 7.1 compared to 6.8 previously Fructosamine is now 239  Current management, blood sugar patterns and problems identified:  He still has relatively high fasting readings, previously averaging 160  He was started on Ozempic instead of Januvia in 5/19  He has titrated this to 0.5 mg for the last 4 weeks has no nausea; he thinks it does reduce his appetite somewhat  However he thinks that he is moderating on his portions and calorie intake already including skipping breakfast and lunch twice a week as a periodic fast  He has finally lost 7 pounds with this change  He has started taking his Amaryl in the evening now since previously was taking this in the morning  No hypoglycemic symptoms during the day and lowest reading 110  Overall his postprandial readings are fluctuating less and only rarely above 160        Side effects from medications have been: None   Glucose monitoring:  done 1 times a day         Glucometer: One Touch Verio .      Blood Glucose readings by download  Mean values apply above for all meters except median for One Touch  PRE-MEAL Fasting Lunch Dinner Bedtime Overall  Glucose range:  138-179   110-151   110-179  Mean/median:   150  116  124   139   POST-MEAL PC Breakfast PC Lunch PC Dinner  Glucose range:    127-165  Mean/median:    146    PREVIOUS readings:   PRE-MEAL Fasting Lunch 5-7 PM Bedtime Overall  Glucose range:  144-204   81-183  102-147  81-204  Mean/median:  160  130    145+/-26    Self-care: The diet that the patient has been following is: tries to limit [employer], Carbohydrates and avoiding drinks with sugar .      Typical meal intake: Breakfast is eggs and cheese.  Lunch is usually [place] yogurt.  Dinner: meat and vegetables    His snacks are fruits like apples and nuts             Dietician visit, most recent: 02/2017               Exercise:  walking during the day, he thinks he is getting 10,000 steps a day  Weight history:  Wt Readings from Last 3 Encounters:  12/17/17 287 lb (130.2 kg)  11/23/17 285 lb (129.3 kg)  10/22/17 294 lb 6.4 oz (133.5 kg)    Glycemic control:   Lab Results  Component Value  Date   HGBA1C 7.1 (H) 10/21/2017   HGBA1C 6.8 (H) 05/17/2017   HGBA1C 11.5 (H) 12/15/2016   Lab Results  Component Value Date   MICROALBUR <0.7 05/17/2017   LDLCALC 79 10/21/2017   CREATININE 1.18 10/21/2017   Lab Results  Component Value Date   MICRALBCREAT 1.5 05/17/2017    Lab Results  Component Value Date   FRUCTOSAMINE 239 12/13/2017   FRUCTOSAMINE 268 03/12/2017      Allergies as of 12/17/2017      Reactions   Penicillins    Respiratory distress as infant      Medication List        Accurate as of 12/17/17 11:59 PM. Always use your most recent med list.          aspirin 81 MG tablet Take 81 mg by mouth daily.   desonide 0.05 % cream Commonly known as:  DESOWEN Apply topically 2 (two) times daily. Apply to AA.   FARXIGA 10 MG Tabs tablet Generic drug:  dapagliflozin propanediol TAKE 1 TABLET BY MOUTH EVERY DAY   glimepiride 4 MG tablet Commonly known as:  AMARYL TAKE 1 TABLET (4 MG TOTAL) BY MOUTH DAILY BEFORE BREAKFAST.   glucose blood  test strip Commonly known as:  ONETOUCH VERIO Use to check blood sugars 2 times daily   ibuprofen 200 MG tablet Commonly known as:  ADVIL,MOTRIN Take 200 mg by mouth at bedtime as needed.   losartan 100 MG tablet Commonly known as:  COZAAR TAKE 1 TABLET BY MOUTH EVERY DAY   metFORMIN 1000 MG tablet Commonly known as:  GLUCOPHAGE Take 1 tablet (1,000 mg total) by mouth 2 (two) times daily with a meal.   omeprazole 40 MG capsule Commonly known as:  PRILOSEC TAKE ONE CAPSULE BY MOUTH EVERY DAY AS NEEDED   ONETOUCH VERIO FLEX SYSTEM w/Device Kit 1 kit by Does not apply route daily. Use to check blood sugars 2 times daily   OZEMPIC 0.25 or 0.5 MG/DOSE Sopn Generic drug:  Semaglutide INJECT 0.5 MG INTO THE SKIN ONCE A WEEK. START WITH 0.25 MG WEEKLY FOR THE FIRST 4 WEEKS   pravastatin 40 MG tablet Commonly known as:  PRAVACHOL Take 1 tablet (40 mg total) by mouth daily.   sildenafil 20 MG tablet Commonly known as:  REVATIO Take 2-5 tablets (40-100 mg total) by mouth daily as needed (relations).   Turmeric 500 MG Caps Take by mouth.   Vitamin B12 3000 MCG Subl Place 3,000 mcg under the tongue.   Vitamin D3 1000 units Caps Take 1 capsule (1,000 Units total) by mouth daily.       Allergies:  Allergies  Allergen Reactions  . Penicillins     Respiratory distress as infant    Past Medical History:  Diagnosis Date  . Depression    intolerant of cymbalta and wellbutrin  . Diverticulosis of colon   . Esophagitis 03/2015   by EGD  . Gastritis 03/2015   by EGD - mild, chronic  . Hyperlipidemia   . Hypertension   . Lichen simplex chronicus 05/2015   R scrotum Evan [name])  . Murmur    aortic sclerosis, SBE  prophlaxis not needed as per cardio  . NAFLD (nonalcoholic fatty liver disease) 01/2015   by [place]  . Ophthalmic migraine    with slurred speech (Evan [name])  . T2DM (type 2 diabetes mellitus) (Evan [name])    DSME 09/2013    Past Surgical History:  Procedure Laterality  Date  . COLONOSCOPY  2006   diverticulosis per pt  . COLONOSCOPY  03/2015   2 polyps, rpt 5 yrs Evan [name])  . ESOPHAGOGASTRODUODENOSCOPY  03/2015   mild chronic gastritis, esophagitis   . LUMBAR LAMINECTOMY  1989   L5-S1    Family History  Problem Relation Age of Onset  . Alzheimer's disease Father   . Hypertension Father   . Stroke Father   . Hyperlipidemia Father   . Coronary artery disease Brother 83       diabetic, MI  . Prostate cancer Brother   . Diabetes Brother   . Coronary artery disease Maternal Uncle   . Schizophrenia Paternal Uncle   . Emphysema Mother     Social History:  reports that he has never smoked. He has never used smokeless tobacco. He reports that he drinks alcohol. He reports that he does not use drugs.   Review of Systems    Lipid history: On pravastatin 40 mg since his initial visit, this was increased because of LDL over 100 LDL is at goal     Lab Results  Component Value Date   CHOL 171 10/21/2017   HDL 64.50 10/21/2017   LDLCALC 79 10/21/2017   LDLDIRECT 99.0 05/20/2015   TRIG 139.0 10/21/2017   CHOLHDL 3 10/21/2017           Hypertension: On Losartan 1/2 Tablet with good control    FATTY liver: His ALT is last normal   Lab Results  Component Value Date   ALT 36 10/21/2017     Most recent foot exam: 8/18    LABS:  Lab on 12/13/2017  Component Date Value Ref Range Status  . Glucose, Bld 12/13/2017 127* 70 - 99 mg/dL Final  . Fructosamine 12/13/2017 239  0 - 285 umol/L Final   Comment: Published reference interval for apparently healthy subjects between age 9 and 33 is 56 - 285 umol/L and in a poorly controlled diabetic population is 228 - 563 umol/L with a mean of 396 umol/L.     Physical Examination:  BP 140/80 (BP Location: Left Arm, Patient Position: Sitting, Cuff Size: Normal)   Pulse 74   Wt 287 lb (130.2 kg)   SpO2 96%   BMI 40.60 kg/m   No pedal edema presen  non-insulin-dependent  ASSESSMENT:  Diabetes type 2, non-insulin-dependent  See history of present illness for detailed discussion of current diabetes management, blood sugar patterns and problems identified     His A1c is again 7.1 but is using Ozempic instead of Januvia is weight is improving now finally Has been on 4 drug regimen including Wilder Glade  He has difficulty with fasting hyperglycemia  Since he has a blood sugar that time during the day will not be able to increase his Amaryl He is usually doing well with his diet and tries to be active  HYPERTENSION: Controlled, no evidence of nephropathy   PLAN:   We will continue 0.5 mg Ozempic To call if blood sugars are continuing to increase in the mornings, may possibly do better with basal insulin rather than glimepiride Will reassess in 3 months, to continue checking blood sugars at various times as discussed  There are no Patient Instructions on file for this visit.    Elayne Snare 12/19/2017, 5:56 PM   Note: This office note was prepared with Dragon voice recognition system technology. Any transcriptional errors that result from this process are unintentional.

## 2017-12-20 DIAGNOSIS — M25562 Pain in left knee: Secondary | ICD-10-CM | POA: Diagnosis not present

## 2017-12-20 DIAGNOSIS — M25561 Pain in right knee: Secondary | ICD-10-CM | POA: Diagnosis not present

## 2017-12-20 DIAGNOSIS — M25661 Stiffness of right knee, not elsewhere classified: Secondary | ICD-10-CM | POA: Diagnosis not present

## 2017-12-20 DIAGNOSIS — M25662 Stiffness of left knee, not elsewhere classified: Secondary | ICD-10-CM | POA: Diagnosis not present

## 2017-12-21 DIAGNOSIS — K080 Exfoliation of teeth due to systemic causes: Secondary | ICD-10-CM | POA: Diagnosis not present

## 2017-12-22 DIAGNOSIS — M25662 Stiffness of left knee, not elsewhere classified: Secondary | ICD-10-CM | POA: Diagnosis not present

## 2017-12-22 DIAGNOSIS — M25561 Pain in right knee: Secondary | ICD-10-CM | POA: Diagnosis not present

## 2017-12-22 DIAGNOSIS — M25661 Stiffness of right knee, not elsewhere classified: Secondary | ICD-10-CM | POA: Diagnosis not present

## 2017-12-22 DIAGNOSIS — M25562 Pain in left knee: Secondary | ICD-10-CM | POA: Diagnosis not present

## 2017-12-27 DIAGNOSIS — M25661 Stiffness of right knee, not elsewhere classified: Secondary | ICD-10-CM | POA: Diagnosis not present

## 2017-12-27 DIAGNOSIS — M25561 Pain in right knee: Secondary | ICD-10-CM | POA: Diagnosis not present

## 2017-12-27 DIAGNOSIS — M25562 Pain in left knee: Secondary | ICD-10-CM | POA: Diagnosis not present

## 2017-12-27 DIAGNOSIS — M25662 Stiffness of left knee, not elsewhere classified: Secondary | ICD-10-CM | POA: Diagnosis not present

## 2018-01-04 DIAGNOSIS — M25561 Pain in right knee: Secondary | ICD-10-CM | POA: Diagnosis not present

## 2018-01-04 DIAGNOSIS — M25662 Stiffness of left knee, not elsewhere classified: Secondary | ICD-10-CM | POA: Diagnosis not present

## 2018-01-04 DIAGNOSIS — M25661 Stiffness of right knee, not elsewhere classified: Secondary | ICD-10-CM | POA: Diagnosis not present

## 2018-01-04 DIAGNOSIS — M25562 Pain in left knee: Secondary | ICD-10-CM | POA: Diagnosis not present

## 2018-01-07 DIAGNOSIS — M25661 Stiffness of right knee, not elsewhere classified: Secondary | ICD-10-CM | POA: Diagnosis not present

## 2018-01-07 DIAGNOSIS — M25561 Pain in right knee: Secondary | ICD-10-CM | POA: Diagnosis not present

## 2018-01-07 DIAGNOSIS — M25662 Stiffness of left knee, not elsewhere classified: Secondary | ICD-10-CM | POA: Diagnosis not present

## 2018-01-07 DIAGNOSIS — M25562 Pain in left knee: Secondary | ICD-10-CM | POA: Diagnosis not present

## 2018-01-11 DIAGNOSIS — M25561 Pain in right knee: Secondary | ICD-10-CM | POA: Diagnosis not present

## 2018-01-11 DIAGNOSIS — M25562 Pain in left knee: Secondary | ICD-10-CM | POA: Diagnosis not present

## 2018-01-11 DIAGNOSIS — M25661 Stiffness of right knee, not elsewhere classified: Secondary | ICD-10-CM | POA: Diagnosis not present

## 2018-01-11 DIAGNOSIS — M25662 Stiffness of left knee, not elsewhere classified: Secondary | ICD-10-CM | POA: Diagnosis not present

## 2018-02-01 ENCOUNTER — Other Ambulatory Visit: Payer: Self-pay | Admitting: Endocrinology

## 2018-02-01 ENCOUNTER — Other Ambulatory Visit: Payer: Self-pay | Admitting: Family Medicine

## 2018-02-02 NOTE — Telephone Encounter (Signed)
Desonide cream Last filled:  10/07/17, #30 g Last OV (CPE):  11/23/17 Next OV:  none

## 2018-02-03 ENCOUNTER — Other Ambulatory Visit: Payer: Self-pay | Admitting: Endocrinology

## 2018-02-27 ENCOUNTER — Other Ambulatory Visit: Payer: Self-pay | Admitting: Endocrinology

## 2018-02-28 ENCOUNTER — Other Ambulatory Visit: Payer: Self-pay | Admitting: Family Medicine

## 2018-03-21 ENCOUNTER — Other Ambulatory Visit (INDEPENDENT_AMBULATORY_CARE_PROVIDER_SITE_OTHER): Payer: Federal, State, Local not specified - PPO

## 2018-03-21 DIAGNOSIS — E1165 Type 2 diabetes mellitus with hyperglycemia: Secondary | ICD-10-CM

## 2018-03-21 LAB — COMPREHENSIVE METABOLIC PANEL
ALK PHOS: 47 U/L (ref 39–117)
ALT: 28 U/L (ref 0–53)
AST: 24 U/L (ref 0–37)
Albumin: 4.4 g/dL (ref 3.5–5.2)
BUN: 17 mg/dL (ref 6–23)
CO2: 29 mEq/L (ref 19–32)
Calcium: 9.4 mg/dL (ref 8.4–10.5)
Chloride: 101 mEq/L (ref 96–112)
Creatinine, Ser: 1 mg/dL (ref 0.40–1.50)
GFR: 79.77 mL/min (ref >60.00)
GLUCOSE: 149 mg/dL — AB (ref 70–99)
POTASSIUM: 4.7 meq/L (ref 3.5–5.1)
Sodium: 137 mEq/L (ref 135–145)
TOTAL PROTEIN: 7.6 g/dL (ref 6.0–8.3)
Total Bilirubin: 0.4 mg/dL (ref 0.2–1.2)

## 2018-03-21 LAB — HEMOGLOBIN A1C: HEMOGLOBIN A1C: 6.4 % (ref 4.6–6.5)

## 2018-03-24 ENCOUNTER — Ambulatory Visit: Payer: Federal, State, Local not specified - PPO | Admitting: Endocrinology

## 2018-03-24 ENCOUNTER — Encounter: Payer: Self-pay | Admitting: Endocrinology

## 2018-03-24 VITALS — BP 136/74 | HR 78 | Ht 72.0 in | Wt 280.0 lb

## 2018-03-24 DIAGNOSIS — E669 Obesity, unspecified: Secondary | ICD-10-CM

## 2018-03-24 DIAGNOSIS — E1169 Type 2 diabetes mellitus with other specified complication: Secondary | ICD-10-CM

## 2018-03-24 DIAGNOSIS — I10 Essential (primary) hypertension: Secondary | ICD-10-CM | POA: Diagnosis not present

## 2018-03-24 DIAGNOSIS — Z23 Encounter for immunization: Secondary | ICD-10-CM | POA: Diagnosis not present

## 2018-03-24 NOTE — Progress Notes (Signed)
Patient ID: Evan [name], male   DOB: Feb 02, 1956, 62 y.o.   MRN: [id number]          Reason for Appointment: Follow-up for Type 2 Diabetes   History of Present Illness:          Date of diagnosis of type 2 diabetes mellitus: 2012        Background history:   Diagnosed incidentally with a high blood sugar Has been on metformin since diagnosis and subsequently Amaryl had been added He has had sporadic follow-up for the last few years His A1c had been below 7 to about 08/2015 Subsequently in 9/17 his A1c had gone up to 8%  Recent history:      Non-insulin hypoglycemic drugs the patient is taking are: Farxiga 10 mg, Amaryl 2 mg in the pm., metformin 1 g twice a day, Ozempic 0.5 mg weekly  His A1c is back down to 6.4 compared to 7.1 previously  Current management, blood sugar patterns and problems identified:  He says that he is trying to follow a diet with skipping breakfast and not eating after 8 PM  He is cutting back on his portions also with subsequent weight loss  Again has relatively high fasting readings, he thinks they are mostly averaging about 150+ and was 149 in the lab  This is despite his blood sugars being near normal at bedtime with taking Amaryl at dinnertime  Still fairly active  Has been taking all his other medications including Ozempic        Side effects from medications have been: None   Glucose monitoring:  done 1 + times a day         Glucometer: One Touch Verio .      Blood Glucose readings by recall   PRE-MEAL Fasting Lunch Dinner Bedtime Overall  Glucose range: 145-170  ? 103   Mean/median:        POST-MEAL PC Breakfast PC Lunch PC Dinner  Glucose range:     Mean/median:      Previous readings  Mean values apply above for all meters except median for One Touch  PRE-MEAL Fasting Lunch Dinner Bedtime Overall  Glucose range:  138-179   110-151   110-179  Mean/median:  150  116  124   139   POST-MEAL PC Breakfast PC Lunch PC Dinner    Glucose range:    127-165  Mean/median:    146    Self-care: The diet that the patient has been following is: tries to limit [employer], Carbohydrates and avoiding drinks with sugar .      Typical meal intake: Breakfast is eggs and cheese.  Lunch is usually [place] yogurt.  Dinner: meat and vegetables    His snacks are fruits like apples and nuts             Dietician visit, most recent: 02/2017               Exercise:  walking during the day, he thinks he is getting 10,000 steps a day  Weight history:  Wt Readings from Last 3 Encounters:  03/24/18 280 lb (127 kg)  12/17/17 287 lb (130.2 kg)  11/23/17 285 lb (129.3 kg)    Glycemic control:   Lab Results  Component Value Date   HGBA1C 6.4 03/21/2018   HGBA1C 7.1 (H) 10/21/2017   HGBA1C 6.8 (H) 05/17/2017   Lab Results  Component Value Date   MICROALBUR <0.7 05/17/2017   [place] 79 10/21/2017  CREATININE 1.00 03/21/2018   Lab Results  Component Value Date   MICRALBCREAT 1.5 05/17/2017    Lab Results  Component Value Date   FRUCTOSAMINE 239 12/13/2017   FRUCTOSAMINE 268 03/12/2017      Allergies as of 03/24/2018      Reactions   Penicillins    Respiratory distress as infant      Medication List        Accurate as of 03/24/18  1:48 PM. Always use your most recent med list.          aspirin 81 MG tablet Take 81 mg by mouth daily.   desonide 0.05 % cream Commonly known as:  DESOWEN APPLY TOPICALLY 2 (TWO) TIMES DAILY. APPLY TO AFFECTED AREA   FARXIGA 10 MG Tabs tablet Generic drug:  dapagliflozin propanediol TAKE 1 TABLET BY MOUTH EVERY DAY   glimepiride 4 MG tablet Commonly known as:  AMARYL TAKE 1 TABLET (4 MG TOTAL) BY MOUTH DAILY BEFORE BREAKFAST.   glucose blood test strip Use to check blood sugars 2 times daily   ibuprofen 200 MG tablet Commonly known as:  ADVIL,MOTRIN Take 200 mg by mouth at bedtime as needed.   losartan 100 MG tablet Commonly known as:  COZAAR TAKE 1 TABLET BY  MOUTH EVERY DAY   metFORMIN 1000 MG tablet Commonly known as:  GLUCOPHAGE Take 1 tablet (1,000 mg total) by mouth 2 (two) times daily with a meal.   omeprazole 40 MG capsule Commonly known as:  PRILOSEC TAKE ONE CAPSULE BY MOUTH EVERY DAY AS NEEDED   ONETOUCH VERIO FLEX SYSTEM w/Device Kit 1 kit by Does not apply route daily. Use to check blood sugars 2 times daily   OZEMPIC (0.25 OR 0.5 MG/DOSE) 2 MG/1.5ML Sopn Generic drug:  Semaglutide(0.25 or 0.5MG/DOS) INJECT 0.5 MG INTO THE SKIN ONCE A WEEK. START WITH 0.25 MG WEEKLY FOR THE FIRST 4 WEEKS   pravastatin 40 MG tablet Commonly known as:  PRAVACHOL Take 1 tablet (40 mg total) by mouth daily.   sildenafil 20 MG tablet Commonly known as:  REVATIO TAKE 2-5 TABLETS (40-100 MG TOTAL) BY MOUTH DAILY AS NEEDED (RELATIONS).   Turmeric 500 MG Caps Take by mouth.   Vitamin B12 3000 MCG Subl Place 3,000 mcg under the tongue.   Vitamin D3 1000 units Caps Take 1 capsule (1,000 Units total) by mouth daily.       Allergies:  Allergies  Allergen Reactions  . Penicillins     Respiratory distress as infant    Past Medical History:  Diagnosis Date  . Depression    intolerant of cymbalta and wellbutrin  . Diverticulosis of colon   . Esophagitis 03/2015   by EGD  . Gastritis 03/2015   by EGD - mild, chronic  . Hyperlipidemia   . Hypertension   . Lichen simplex chronicus 05/2015   R scrotum [name])  . Murmur    aortic sclerosis, SBE  prophlaxis not needed as per cardio  . NAFLD (nonalcoholic fatty liver disease) 01/2015   by [place]  . 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 T2DM (type 2 diabetes mellitus) ([place])    DSME 09/2013    Past Surgical History:  Procedure Laterality Date  . COLONOSCOPY  2006   diverticulosis per pt  . COLONOSCOPY  03/2015   2 polyps, rpt 5 yrs [name])  . ESOPHAGOGASTRODUODENOSCOPY  03/2015   mild chronic gastritis, esophagitis   . [place]  L5-S1     Family History  Problem Relation Age of Onset  . Alzheimer's disease Father   . Hypertension Father   . Stroke Father   . Hyperlipidemia Father   . Coronary artery disease Brother 29       diabetic, MI  . Prostate cancer Brother   . Diabetes Brother   . Coronary artery disease Maternal Uncle   . Schizophrenia Paternal Uncle   . Emphysema Mother     Social History:  reports that he has never smoked. He has never used smokeless tobacco. He reports that he drinks alcohol. He reports that he does not use drugs.   Review of Systems  Lipid history: On pravastatin 40 mg since his initial visit, this was increased because of LDL over 100 LDL is at goal     Lab Results  Component Value Date   CHOL 171 10/21/2017   HDL 64.50 10/21/2017   LDLCALC 79 10/21/2017   LDLDIRECT 99.0 05/20/2015   TRIG 139.0 10/21/2017   CHOLHDL 3 10/21/2017           Hypertension: On Losartan 1/2 Tablet with good control    FATTY liver: His ALT is last normal   Lab Results  Component Value Date   ALT 28 03/21/2018     Most recent foot exam: 8/18    LABS:  Lab on 03/21/2018  Component Date Value Ref Range Status  . Sodium 03/21/2018 137  135 - 145 mEq/L Final  . Potassium 03/21/2018 4.7  3.5 - 5.1 mEq/L Final  . Chloride 03/21/2018 101  96 - 112 mEq/L Final  . CO2 03/21/2018 29  19 - 32 mEq/L Final  . Glucose, Bld 03/21/2018 149* 70 - 99 mg/dL Final  . BUN 03/21/2018 17  6 - 23 mg/dL Final  . Creatinine, Ser 03/21/2018 1.00  0.40 - 1.50 mg/dL Final  . Total Bilirubin 03/21/2018 0.4  0.2 - 1.2 mg/dL Final  . Alkaline Phosphatase 03/21/2018 47  39 - 117 U/L Final  . AST 03/21/2018 24  0 - 37 U/L Final  . ALT 03/21/2018 28  0 - 53 U/L Final  . Total Protein 03/21/2018 7.6  6.0 - 8.3 g/dL Final  . Albumin 03/21/2018 4.4  3.5 - 5.2 g/dL Final  . Calcium 03/21/2018 9.4  8.4 - 10.5 mg/dL Final  . GFR 03/21/2018 79.77  >60.00 mL/min Final  . Hgb A1c MFr Bld 03/21/2018 6.4  4.6 - 6.5 %  Final   Glycemic Control Guidelines for People with Diabetes:Non Diabetic:  <6%Goal of Therapy: <7%Additional Action Suggested:  >8%     Physical Examination:  BP 136/74   Pulse 78   Ht 6' (1.829 m)   Wt 280 lb (127 kg)   SpO2 96%   BMI 37.97 kg/m   Trace lower leg edema present  Diabetic Foot Exam - Simple   Simple Foot Form Diabetic Foot exam was performed with the following findings:  Yes 03/24/2018  1:48 PM  Visual Inspection No deformities, no ulcerations, no other skin breakdown bilaterally:  Yes Sensation Testing Intact to touch and monofilament testing bilaterally:  Yes Pulse Check Posterior Tibialis and Dorsalis pulse intact bilaterally:  Yes Comments      ASSESSMENT:  Diabetes type 2, non-insulin-dependent  See history of present illness for detailed discussion of current diabetes management, blood sugar patterns and problems identified     His A1c is much better at 6.4  He continues to lose weight Also his changes  in lifestyle and diet with intermittent fasting appears to be helping his weight loss However discussed that this may not be sustainable long-term  Has been on 4 drug regimen including Farxiga and Ozempic  Did not have his meter for download but he thinks his fasting readings tend to be high but otherwise blood sugars are excellent  HYPERTENSION: Controlled  History of fatty liver: ALT is consistently normal   PLAN:   He will try taking 1 tablet of metformin at dinnertime and 1 at bedtime Follow-up in 4 months Call if blood sugars are not consistently controlled Influenza vaccine given  There are no Patient Instructions on file for this visit.    Elayne Snare 03/24/2018, 1:48 PM   Note: This office note was prepared with Dragon voice recognition system technology. Any transcriptional errors that result from this process are unintentional.

## 2018-04-05 ENCOUNTER — Other Ambulatory Visit: Payer: Self-pay | Admitting: Family Medicine

## 2018-05-17 ENCOUNTER — Other Ambulatory Visit: Payer: Self-pay | Admitting: Family Medicine

## 2018-05-25 ENCOUNTER — Other Ambulatory Visit: Payer: Self-pay | Admitting: Endocrinology

## 2018-06-16 ENCOUNTER — Other Ambulatory Visit: Payer: Self-pay

## 2018-06-16 MED ORDER — SEMAGLUTIDE(0.25 OR 0.5MG/DOS) 2 MG/1.5ML ~~LOC~~ SOPN: 0.5000 mg | PEN_INJECTOR | SUBCUTANEOUS | 2 refills | Status: DC

## 2018-06-17 ENCOUNTER — Other Ambulatory Visit: Payer: Self-pay | Admitting: Family Medicine

## 2018-06-30 DIAGNOSIS — K080 Exfoliation of teeth due to systemic causes: Secondary | ICD-10-CM | POA: Diagnosis not present

## 2018-07-02 ENCOUNTER — Other Ambulatory Visit: Payer: Self-pay | Admitting: Endocrinology

## 2018-07-06 ENCOUNTER — Other Ambulatory Visit: Payer: Self-pay | Admitting: Family Medicine

## 2018-07-19 ENCOUNTER — Other Ambulatory Visit: Payer: Self-pay | Admitting: Endocrinology

## 2018-07-19 DIAGNOSIS — E669 Obesity, unspecified: Principal | ICD-10-CM

## 2018-07-19 DIAGNOSIS — E7800 Pure hypercholesterolemia, unspecified: Secondary | ICD-10-CM

## 2018-07-19 DIAGNOSIS — E1169 Type 2 diabetes mellitus with other specified complication: Secondary | ICD-10-CM

## 2018-07-20 ENCOUNTER — Other Ambulatory Visit (INDEPENDENT_AMBULATORY_CARE_PROVIDER_SITE_OTHER): Payer: Medicare Other

## 2018-07-20 DIAGNOSIS — E1169 Type 2 diabetes mellitus with other specified complication: Secondary | ICD-10-CM | POA: Diagnosis not present

## 2018-07-20 DIAGNOSIS — E7800 Pure hypercholesterolemia, unspecified: Secondary | ICD-10-CM | POA: Diagnosis not present

## 2018-07-20 DIAGNOSIS — E669 Obesity, unspecified: Secondary | ICD-10-CM | POA: Diagnosis not present

## 2018-07-20 LAB — MICROALBUMIN / CREATININE URINE RATIO
Creatinine,U: 45.2 mg/dL
MICROALB/CREAT RATIO: 1.5 mg/g (ref 0.0–30.0)
Microalb, Ur: <0.7 mg/dL (ref 0.0–1.9)

## 2018-07-20 LAB — COMPREHENSIVE METABOLIC PANEL
ALT: 24 U/L (ref 0–53)
AST: 18 U/L (ref 0–37)
Albumin: 4.6 g/dL (ref 3.5–5.2)
Alkaline Phosphatase: 54 U/L (ref 39–117)
BUN: 16 mg/dL (ref 6–23)
CO2: 28 mEq/L (ref 19–32)
Calcium: 9.8 mg/dL (ref 8.4–10.5)
Chloride: 99 mEq/L (ref 96–112)
Creatinine, Ser: 1.05 mg/dL (ref 0.40–1.50)
GFR: 70.87 mL/min (ref >60.00)
Glucose, Bld: 148 mg/dL — ABNORMAL HIGH (ref 70–99)
Potassium: 5.1 mEq/L (ref 3.5–5.1)
Sodium: 138 mEq/L (ref 135–145)
Total Bilirubin: 0.4 mg/dL (ref 0.2–1.2)
Total Protein: 7.3 g/dL (ref 6.0–8.3)

## 2018-07-20 LAB — LIPID PANEL
CHOL/HDL RATIO: 3
Cholesterol: 223 mg/dL — ABNORMAL HIGH (ref 0–200)
HDL: 66.6 mg/dL (ref >39.00)
LDL Cholesterol: 124 mg/dL — ABNORMAL HIGH (ref 0–99)
NonHDL: 156.49
Triglycerides: 160 mg/dL — ABNORMAL HIGH (ref 0.0–149.0)
VLDL: 32 mg/dL (ref 0.0–40.0)

## 2018-07-20 LAB — HEMOGLOBIN A1C: Hgb A1c MFr Bld: 6.7 % — ABNORMAL HIGH (ref 4.6–6.5)

## 2018-07-25 ENCOUNTER — Encounter: Payer: Self-pay | Admitting: Endocrinology

## 2018-07-25 ENCOUNTER — Ambulatory Visit (INDEPENDENT_AMBULATORY_CARE_PROVIDER_SITE_OTHER): Payer: Medicare Other | Admitting: Endocrinology

## 2018-07-25 VITALS — BP 132/76 | HR 92 | Ht 72.0 in | Wt 284.4 lb

## 2018-07-25 DIAGNOSIS — I10 Essential (primary) hypertension: Secondary | ICD-10-CM | POA: Diagnosis not present

## 2018-07-25 DIAGNOSIS — Z794 Long term (current) use of insulin: Secondary | ICD-10-CM

## 2018-07-25 DIAGNOSIS — E7800 Pure hypercholesterolemia, unspecified: Secondary | ICD-10-CM

## 2018-07-25 DIAGNOSIS — E1165 Type 2 diabetes mellitus with hyperglycemia: Secondary | ICD-10-CM | POA: Diagnosis not present

## 2018-07-25 MED ORDER — ROSUVASTATIN CALCIUM 10 MG PO TABS: 10.0000 mg | ORAL_TABLET | Freq: Every day | ORAL | 3 refills | Status: DC

## 2018-07-25 MED ORDER — SEMAGLUTIDE (1 MG/DOSE) 2 MG/1.5ML ~~LOC~~ SOPN: 1.0000 mg | PEN_INJECTOR | SUBCUTANEOUS | 3 refills | Status: DC

## 2018-07-25 NOTE — Progress Notes (Signed)
Patient ID: Evan [name], male   DOB: [birth year], 63 y.o.   MRN: [id number]          Reason for Appointment: Follow-up for Type 2 Diabetes   History of Present Illness:          Date of diagnosis of type 2 diabetes mellitus: 2012        Background history:   Diagnosed incidentally with a high blood sugar Has been on metformin since diagnosis and subsequently Amaryl had been added He has had sporadic follow-up for the last few years His A1c had been below 7 to about 08/2015 Subsequently in 9/17 his A1c had gone up to 8%  Recent history:      Non-insulin hypoglycemic drugs the patient is taking are: Farxiga 10 mg, Amaryl 2 mg in the pm., metformin 1 g twice a day, Ozempic 0.5 mg weekly  His A1c is slightly higher at 6.7 compared to 6.4  Current management, blood sugar patterns and problems identified:  He feels that he has higher fasting blood sugars at home recently, lab glucose was 148 and he has had a reading as high as 199  However he has not changed his diet much  On his previous visit he was told to take metformin at dinner and bedtime instead of breakfast and dinnertime but he does not think his blood sugars improved with this  He is a little less active in wintertime although trying to be walking as much as possible  Checking blood sugar less frequently and also none after meals at night  Previously did not have higher readings after supper but mostly high readings in the mornings  Occasionally he may get a false low reading with his test strips but he will recheck to confirm, no hypoglycemia or hypoglycemic symptoms present         Side effects from medications have been: None   Glucose monitoring:  done 1 or less times a day         Glucometer: One Touch Verio .      Blood Glucose readings and median readings by download   PRE-MEAL Fasting Lunch Dinner Bedtime Overall  Glucose range:  143-199  123-149  163    Mean/median:      149   POST-MEAL PC Breakfast  PC Lunch PC Dinner  Glucose range:   ?  Mean/median:       Self-care: The diet that the patient has been following is: tries to limit [employer], Carbohydrates and avoiding drinks with sugar .      Typical meal intake: Breakfast is eggs and cheese.  Lunch is usually [place] yogurt.  Dinner: meat and vegetables    His snacks are fruits like apples and nuts             Dietician visit, most recent: 02/2017               Exercise:  walking during the day  Weight history:  Wt Readings from Last 3 Encounters:  07/25/18 284 lb 6.4 oz (129 kg)  03/24/18 280 lb (127 kg)  12/17/17 287 lb (130.2 kg)    Glycemic control:   Lab Results  Component Value Date   HGBA1C 6.7 (H) 07/20/2018   HGBA1C 6.4 03/21/2018   HGBA1C 7.1 (H) 10/21/2017   Lab Results  Component Value Date   MICROALBUR <0.7 07/20/2018   LDLCALC 124 (H) 07/20/2018   CREATININE 1.05 07/20/2018   Lab Results  Component Value Date  MICRALBCREAT 1.5 07/20/2018    Lab Results  Component Value Date   FRUCTOSAMINE 239 12/13/2017   FRUCTOSAMINE 268 03/12/2017      Allergies as of 07/25/2018      Reactions   Penicillins    Respiratory distress as infant      Medication List       Accurate as of July 25, 2018  8:42 AM. Always use your most recent med list.        aspirin 81 MG tablet Take 81 mg by mouth daily.   desonide 0.05 % cream Commonly known as:  DESOWEN APPLY TOPICALLY 2 (TWO) TIMES DAILY. APPLY TO AFFECTED AREA   FARXIGA 10 MG Tabs tablet Generic drug:  dapagliflozin propanediol TAKE 1 TABLET BY MOUTH EVERY DAY   glimepiride 4 MG tablet Commonly known as:  AMARYL Take 1 tablet (4 mg total) by mouth daily before breakfast.   glucose blood test strip Commonly known as:  ONETOUCH VERIO Use to check blood sugars 2 times daily   ibuprofen 200 MG tablet Commonly known as:  ADVIL,MOTRIN Take 200 mg by mouth at bedtime as needed.   losartan 100 MG tablet Commonly known as:  COZAAR TAKE  1 TABLET BY MOUTH EVERY DAY   metFORMIN 1000 MG tablet Commonly known as:  GLUCOPHAGE TAKE 1 TABLET BY MOUTH TWICE A DAY WITH A MEAL   omeprazole 40 MG capsule Commonly known as:  PRILOSEC TAKE ONE CAPSULE BY MOUTH EVERY DAY AS NEEDED   ONETOUCH VERIO FLEX SYSTEM w/Device Kit 1 kit by Does not apply route daily. Use to check blood sugars 2 times daily   pravastatin 40 MG tablet Commonly known as:  PRAVACHOL TAKE 1 TABLET BY MOUTH EVERY DAY   Semaglutide(0.25 or 0.5MG/DOS) 2 MG/1.5ML Sopn Commonly known as:  OZEMPIC (0.25 OR 0.5 MG/DOSE) Inject 0.5 mg into the skin once a week.   sildenafil 20 MG tablet Commonly known as:  REVATIO TAKE 2-5 TABLETS (40-100 MG TOTAL) BY MOUTH DAILY AS NEEDED (RELATIONS).   Turmeric 500 MG Caps Take by mouth.   Vitamin B12 3000 MCG Subl Place 3,000 mcg under the tongue.   Vitamin D3 25 MCG (1000 UT) Caps Take 1 capsule (1,000 Units total) by mouth daily.       Allergies:  Allergies  Allergen Reactions  . Penicillins     Respiratory distress as infant    Past Medical History:  Diagnosis Date  . Depression    intolerant of cymbalta and wellbutrin  . Diverticulosis of colon   . Esophagitis 03/2015   by EGD  . Gastritis 03/2015   by EGD - mild, chronic  . Hyperlipidemia   . Hypertension   . Lichen simplex chronicus 05/2015   R scrotum [name])  . Murmur    aortic sclerosis, SBE  prophlaxis not needed as per cardio  . NAFLD (nonalcoholic fatty liver disease) 01/2015   by [place]  . 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 T2DM (type 2 diabetes mellitus) ([place])    DSME 09/2013    Past Surgical History:  Procedure Laterality Date  . COLONOSCOPY  2006   diverticulosis per pt  . COLONOSCOPY  03/2015   2 polyps, rpt 5 yrs [name])  . ESOPHAGOGASTRODUODENOSCOPY  03/2015   mild chronic gastritis, esophagitis   . LUMBAR LAMINECTOMY  1989   L5-S1    Family History  Problem Relation Age of Onset  . Alzheimer's  disease Father   .  Hypertension Father   . Stroke Father   . Hyperlipidemia Father   . Coronary artery disease Brother 20       diabetic, MI  . Prostate cancer Brother   . Diabetes Brother   . Coronary artery disease Maternal Uncle   . Schizophrenia Paternal Uncle   . Emphysema Mother     Social History:  reports that he has never smoked. He has never used smokeless tobacco. He reports current alcohol use. He reports that he does not use drugs.   Review of Systems  Lipid history: On pravastatin 40 mg since his initial visit, this was increased because of LDL over 100 LDL is now above 100, previously significantly better, he does not think his diet has been significantly changed     Lab Results  Component Value Date   CHOL 223 (H) 07/20/2018   HDL 66.60 07/20/2018   LDLCALC 124 (H) 07/20/2018   LDLDIRECT 99.0 05/20/2015   TRIG 160.0 (H) 07/20/2018   CHOLHDL 3 07/20/2018           Hypertension: On Losartan 1/2 Tablet with good control    FATTY liver: His ALT is normal   Lab Results  Component Value Date   ALT 24 07/20/2018     Most recent foot exam: 8/18    LABS:  Lab on 07/20/2018  Component Date Value Ref Range Status  . Cholesterol 07/20/2018 223* 0 - 200 mg/dL Final   ATP III Classification       Desirable:  < 200 mg/dL               Borderline High:  200 - 239 mg/dL          High:  > = 240 mg/dL  . Triglycerides 07/20/2018 160.0* 0.0 - 149.0 mg/dL Final   Normal:  <150 mg/dLBorderline High:  150 - 199 mg/dL  . HDL 07/20/2018 66.60  >39.00 mg/dL Final  . VLDL 07/20/2018 32.0  0.0 - 40.0 mg/dL Final  . LDL Cholesterol 07/20/2018 124* 0 - 99 mg/dL Final  . Total CHOL/HDL Ratio 07/20/2018 3   Final                  Men          Women1/2 Average Risk     3.4          3.3Average Risk          5.0          4.42X Average Risk          9.6          7.13X Average Risk          15.0          11.0                      . NonHDL 07/20/2018 156.49   Final   NOTE:   Non-HDL goal should be 30 mg/dL higher than patient's LDL goal (i.e. LDL goal of < 70 mg/dL, would have non-HDL goal of < 100 mg/dL)  . Microalb, Ur 07/20/2018 <0.7  0.0 - 1.9 mg/dL Final  . Creatinine,U 07/20/2018 45.2  mg/dL Final  . Microalb Creat Ratio 07/20/2018 1.5  0.0 - 30.0 mg/g Final  . Sodium 07/20/2018 138  135 - 145 mEq/L Final  . Potassium 07/20/2018 5.1  3.5 - 5.1 mEq/L Final  . Chloride 07/20/2018 99  96 - 112 mEq/L Final  .  CO2 07/20/2018 28  19 - 32 mEq/L Final  . Glucose, Bld 07/20/2018 148* 70 - 99 mg/dL Final  . BUN 07/20/2018 16  6 - 23 mg/dL Final  . Creatinine, Ser 07/20/2018 1.05  0.40 - 1.50 mg/dL Final  . Total Bilirubin 07/20/2018 0.4  0.2 - 1.2 mg/dL Final  . Alkaline Phosphatase 07/20/2018 54  39 - 117 U/L Final  . AST 07/20/2018 18  0 - 37 U/L Final  . ALT 07/20/2018 24  0 - 53 U/L Final  . Total Protein 07/20/2018 7.3  6.0 - 8.3 g/dL Final  . Albumin 07/20/2018 4.6  3.5 - 5.2 g/dL Final  . Calcium 07/20/2018 9.8  8.4 - 10.5 mg/dL Final  . GFR 07/20/2018 70.87  >60.00 mL/min Final  . Hgb A1c MFr Bld 07/20/2018 6.7* 4.6 - 6.5 % Final   Glycemic Control Guidelines for People with Diabetes:Non Diabetic:  <6%Goal of Therapy: <7%Additional Action Suggested:  >8%     Physical Examination:  BP 132/76 (BP Location: Left Arm, Patient Position: Sitting, Cuff Size: Normal)   Pulse 92   Ht 6' (1.829 m)   Wt 284 lb 6.4 oz (129 kg)   SpO2 95%   BMI 38.57 kg/m      ASSESSMENT:  Diabetes type 2, non-insulin-dependent  See history of present illness for detailed discussion of current diabetes management, blood sugar patterns and problems identified     His A1c is going up and now 6.7 However most of his high readings appear to be fasting although he has not monitored after meals  Currently not able to lose weight despite saying that he is doing well with his diet and exercise regimen   HYPERTENSION: Controlled with current regimen  LIPIDS: Has higher  LDL despite taking pravastatin 40 mg   PLAN:   He will increase his Ozempic to 1 mg He can use his current pen and take 2 injections of the 0.5 dose and let us know if he has any side effects Discussed that he may have a little nausea this week but otherwise this should improve He will not increase Amaryl unless blood sugars are consistently high both fasting and after dinner He was advised to check sugars more consistently after meals Increase walking as tolerated  Change pravastatin to Crestor 10 mg, discussed LDL targets and need for long-term cardiovascular protection  There are no Patient Instructions on file for this visit.  Total visit time for evaluation and management of multiple problems and counseling =25 minutes Elayne Snare 07/25/2018, 8:42 AM   Note: This office note was prepared with Dragon voice recognition system technology. Any transcriptional errors that result from this process are unintentional.

## 2018-07-25 NOTE — Patient Instructions (Addendum)
Take 2 of 0.5 ozempic today and weekly   Check blood sugars on waking up 3 days a week  Also check blood sugars about 2 hours after meals and do this after different meals by rotation  Recommended blood sugar levels on waking up are 90-130 and about 2 hours after meal is 130-160  Please bring your blood sugar monitor to each visit, thank you   Check insurance coverage for meter  New Rx for lipids

## 2018-07-28 ENCOUNTER — Other Ambulatory Visit: Payer: Self-pay

## 2018-07-28 MED ORDER — GLUCOSE BLOOD VI STRP: ORAL_STRIP | 3 refills | Status: DC

## 2018-07-28 NOTE — Telephone Encounter (Signed)
Do you have a fax # or is there a form that needs to be completed for [facility]? Pt is requesting a Rx for his Dexcom be sent there. I am not familiar with this DME

## 2018-08-01 NOTE — Telephone Encounter (Signed)
I believe this is intended for you. I am not sure what the pt is referring to

## 2018-08-22 ENCOUNTER — Other Ambulatory Visit: Payer: Self-pay | Admitting: Family Medicine

## 2018-09-01 ENCOUNTER — Encounter: Payer: Self-pay | Admitting: Family Medicine

## 2018-09-01 MED ORDER — LOSARTAN POTASSIUM 100 MG PO TABS: 100.0000 mg | ORAL_TABLET | Freq: Every day | ORAL | 1 refills | Status: DC

## 2018-09-01 NOTE — Telephone Encounter (Signed)
E-scribed refill.  Notified pt via MyChart.  ?

## 2018-09-26 ENCOUNTER — Other Ambulatory Visit: Payer: Self-pay | Admitting: Endocrinology

## 2018-10-12 ENCOUNTER — Other Ambulatory Visit: Payer: Self-pay | Admitting: Endocrinology

## 2018-10-25 ENCOUNTER — Other Ambulatory Visit: Payer: Self-pay

## 2018-10-25 ENCOUNTER — Other Ambulatory Visit (INDEPENDENT_AMBULATORY_CARE_PROVIDER_SITE_OTHER): Payer: Medicare Other

## 2018-10-25 DIAGNOSIS — Z794 Long term (current) use of insulin: Secondary | ICD-10-CM | POA: Diagnosis not present

## 2018-10-25 DIAGNOSIS — E1165 Type 2 diabetes mellitus with hyperglycemia: Secondary | ICD-10-CM

## 2018-10-25 DIAGNOSIS — E7800 Pure hypercholesterolemia, unspecified: Secondary | ICD-10-CM

## 2018-10-25 LAB — LIPID PANEL
Cholesterol: 158 mg/dL (ref 0–200)
HDL: 63.6 mg/dL (ref >39.00)
LDL Cholesterol: 60 mg/dL (ref 0–99)
NonHDL: 94.62
Total CHOL/HDL Ratio: 2
Triglycerides: 175 mg/dL — ABNORMAL HIGH (ref 0.0–149.0)
VLDL: 35 mg/dL (ref 0.0–40.0)

## 2018-10-25 LAB — COMPREHENSIVE METABOLIC PANEL
ALT: 26 U/L (ref 0–53)
AST: 21 U/L (ref 0–37)
Albumin: 4.3 g/dL (ref 3.5–5.2)
Alkaline Phosphatase: 49 U/L (ref 39–117)
BUN: 15 mg/dL (ref 6–23)
CO2: 28 mEq/L (ref 19–32)
Calcium: 10 mg/dL (ref 8.4–10.5)
Chloride: 98 mEq/L (ref 96–112)
Creatinine, Ser: 0.92 mg/dL (ref 0.40–1.50)
GFR: 82.47 mL/min (ref >60.00)
Glucose, Bld: 155 mg/dL — ABNORMAL HIGH (ref 70–99)
Potassium: 4.8 mEq/L (ref 3.5–5.1)
Sodium: 135 mEq/L (ref 135–145)
Total Bilirubin: 0.5 mg/dL (ref 0.2–1.2)
Total Protein: 7.4 g/dL (ref 6.0–8.3)

## 2018-10-25 LAB — HEMOGLOBIN A1C: Hgb A1c MFr Bld: 7 % — ABNORMAL HIGH (ref 4.6–6.5)

## 2018-10-26 NOTE — Progress Notes (Signed)
Patient ID: Evan [name], male   DOB: 10-12-1955, 63 y.o.   MRN: [id number]          Reason for Appointment: Follow-up for Type 2 Diabetes   History of Present Illness:          Date of diagnosis of type 2 diabetes mellitus: 2012        Background history:   Diagnosed incidentally with a high blood sugar Has been on metformin since diagnosis and subsequently Amaryl had been added He has had sporadic follow-up for the last few years His A1c had been below 7 to about 08/2015 Subsequently in 9/17 his A1c had gone up to 8%  Recent history:      Non-insulin hypoglycemic drugs the patient is taking are: Farxiga 10 mg, Amaryl 2 mg in the pm., metformin 1 g twice a day, Ozempic 1 mg weekly  His A1c is gradually increasing and now 7% and has been as low as 6.4   Current management, blood sugar patterns and problems identified:  He generally has higher fasting blood sugars although still has not done enough readings after meals  This is even with taking 2 mg Amaryl at dinnertime  Otherwise the readings he has in the last month before and after dinner are excellent  Sometimes will be getting more carbohydrates at meals and snacks but not getting too many bedtime snacks  He has been quite regular with trying to walk almost daily  Weight is down 3 pounds  Not clear why he has not been getting benefit from increasing his Ozempic on the last visit with better control        Side effects from medications have been: None   Glucose monitoring:  done 1 or less times a day         Glucometer: One Touch Verio .      Blood Glucose readings and median readings by download   PRE-MEAL Fasting Lunch Dinner Bedtime Overall  Glucose range:  113-161     98-173  Mean/median:  151   127  122  141   POST-MEAL PC Breakfast PC Lunch PC Dinner  Glucose range:     Mean/median:  166     Previous readings  PRE-MEAL Fasting Lunch Dinner Bedtime Overall  Glucose range:  143-199  123-149  163     Mean/median:      149   POST-MEAL PC Breakfast PC Lunch PC Dinner  Glucose range:   ?  Mean/median:       Self-care: The diet that the patient has been following is: tries to limit [employer], Carbohydrates and avoiding drinks with sugar .      Typical meal intake: Breakfast is eggs and cheese.  Lunch is usually [place] yogurt.  Dinner: meat and vegetables    His snacks are fruits like apples and nuts             Dietician visit, most recent: 02/2017               Exercise:  walking during the day almost daily  Weight history:  Wt Readings from Last 3 Encounters:  10/27/18 281 lb 12.8 oz (127.8 kg)  07/25/18 284 lb 6.4 oz (129 kg)  03/24/18 280 lb (127 kg)    Glycemic control:   Lab Results  Component Value Date   HGBA1C 7.0 (H) 10/25/2018   HGBA1C 6.7 (H) 07/20/2018   HGBA1C 6.4 03/21/2018   Lab Results  Component Value Date  MICROALBUR <0.7 07/20/2018   LDLCALC 60 10/25/2018   CREATININE 0.92 10/25/2018   Lab Results  Component Value Date   MICRALBCREAT 1.5 07/20/2018    Lab Results  Component Value Date   FRUCTOSAMINE 239 12/13/2017   FRUCTOSAMINE 268 03/12/2017      Allergies as of 10/27/2018      Reactions   Penicillins    Respiratory distress as infant      Medication List       Accurate as of Oct 27, 2018  9:09 AM. If you have any questions, ask your nurse or doctor.        STOP taking these medications   pravastatin 40 MG tablet Commonly known as:  PRAVACHOL Stopped by:  Elayne Snare, MD     TAKE these medications   aspirin 81 MG tablet Take 81 mg by mouth daily.   desonide 0.05 % cream Commonly known as:  DESOWEN APPLY TOPICALLY 2 (TWO) TIMES DAILY. APPLY TO AFFECTED AREA   Farxiga 10 MG Tabs tablet Generic drug:  dapagliflozin propanediol TAKE 1 TABLET BY MOUTH EVERY DAY What changed:    how much to take  how to take this  when to take this  additional instructions   glimepiride 4 MG tablet Commonly known as:  AMARYL  TAKE 1 TABLET (4 MG TOTAL) BY MOUTH DAILY BEFORE BREAKFAST. What changed:    when to take this  additional instructions   glucose blood test strip Commonly known as:  OneTouch Verio Use to check blood sugars 2 times daily   ibuprofen 200 MG tablet Commonly known as:  ADVIL Take 200 mg by mouth at bedtime as needed.   losartan 100 MG tablet Commonly known as:  COZAAR Take 1 tablet (100 mg total) by mouth daily.   metFORMIN 1000 MG tablet Commonly known as:  GLUCOPHAGE TAKE 1 TABLET BY MOUTH TWICE A DAY WITH MEALS   omeprazole 40 MG capsule Commonly known as:  PRILOSEC TAKE ONE CAPSULE BY MOUTH EVERY DAY AS NEEDED   OneTouch Verio Flex System w/Device Kit 1 kit by Does not apply route daily. Use to check blood sugars 2 times daily   rosuvastatin 10 MG tablet Commonly known as:  Crestor Take 1 tablet (10 mg total) by mouth daily.   Semaglutide (1 MG/DOSE) 2 MG/1.5ML Sopn Commonly known as:  Ozempic (1 MG/DOSE) Inject 1 mg into the skin once a week.   sildenafil 20 MG tablet Commonly known as:  REVATIO TAKE 2-5 TABLETS (40-100 MG TOTAL) BY MOUTH DAILY AS NEEDED (RELATIONS).   Turmeric 500 MG Caps Take by mouth.   Vitamin B12 3000 MCG Subl Place 3,000 mcg under the tongue.   Vitamin D3 25 MCG (1000 UT) Caps Take 1 capsule (1,000 Units total) by mouth daily.       Allergies:  Allergies  Allergen Reactions  . Penicillins     Respiratory distress as infant    Past Medical History:  Diagnosis Date  . Depression    intolerant of cymbalta and wellbutrin  . Diverticulosis of colon   . Esophagitis 03/2015   by EGD  . Gastritis 03/2015   by EGD - mild, chronic  . Hyperlipidemia   . Hypertension   . Lichen simplex chronicus 05/2015   R scrotum [name])  . Murmur    aortic sclerosis, SBE  prophlaxis not needed as per cardio  . NAFLD (nonalcoholic fatty liver disease) 01/2015   by [place]  . 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 T2DM (type  2 diabetes mellitus) ([place])    DSME 09/2013    Past Surgical History:  Procedure Laterality Date  . COLONOSCOPY  2006   diverticulosis per pt  . COLONOSCOPY  03/2015   2 polyps, rpt 5 yrs [name])  . ESOPHAGOGASTRODUODENOSCOPY  03/2015   mild chronic gastritis, esophagitis   . LUMBAR LAMINECTOMY  1989   L5-S1    Family History  Problem Relation Age of Onset  . Alzheimer's disease Father   . Hypertension Father   . Stroke Father   . Hyperlipidemia Father   . Coronary artery disease Brother 20       diabetic, MI  . Prostate cancer Brother   . Diabetes Brother   . Coronary artery disease Maternal Uncle   . Schizophrenia Paternal Uncle   . Emphysema Mother     Social History:  reports that he has never smoked. He has never used smokeless tobacco. He reports current alcohol use. He reports that he does not use drugs.   Review of Systems  Lipid history: With switching from pravastatin to Crestor his LDL is now much better at 60      Lab Results  Component Value Date   CHOL 158 10/25/2018   HDL 63.60 10/25/2018   LDLCALC 60 10/25/2018   LDLDIRECT 99.0 05/20/2015   TRIG 175.0 (H) 10/25/2018   CHOLHDL 2 10/25/2018           Hypertension: On Losartan 1/2 Tablet with good control  BP Readings from Last 3 Encounters:  10/27/18 140/74  07/25/18 132/76  03/24/18 136/74     FATTY liver: His ALT is normal   Lab Results  Component Value Date   ALT 26 10/25/2018     Most recent foot exam: 8/18    LABS:  Lab on 10/25/2018  Component Date Value Ref Range Status  . Cholesterol 10/25/2018 158  0 - 200 mg/dL Final   ATP III Classification       Desirable:  < 200 mg/dL               Borderline High:  200 - 239 mg/dL          High:  > = 240 mg/dL  . Triglycerides 10/25/2018 175.0* 0.0 - 149.0 mg/dL Final   Normal:  <150 mg/dLBorderline High:  150 - 199 mg/dL  . HDL 10/25/2018 63.60  >39.00 mg/dL Final  . VLDL 10/25/2018 35.0  0.0 - 40.0 mg/dL Final  . LDL  Cholesterol 10/25/2018 60  0 - 99 mg/dL Final  . Total CHOL/HDL Ratio 10/25/2018 2   Final                  Men          Women1/2 Average Risk     3.4          3.3Average Risk          5.0          4.42X Average Risk          9.6          7.13X Average Risk          15.0          11.0                      . NonHDL 10/25/2018 94.62   Final   NOTE:  Non-HDL goal should be 30  mg/dL higher than patient's LDL goal (i.e. LDL goal of < 70 mg/dL, would have non-HDL goal of < 100 mg/dL)  . Sodium 10/25/2018 135  135 - 145 mEq/L Final  . Potassium 10/25/2018 4.8  3.5 - 5.1 mEq/L Final  . Chloride 10/25/2018 98  96 - 112 mEq/L Final  . CO2 10/25/2018 28  19 - 32 mEq/L Final  . Glucose, Bld 10/25/2018 155* 70 - 99 mg/dL Final  . BUN 10/25/2018 15  6 - 23 mg/dL Final  . Creatinine, Ser 10/25/2018 0.92  0.40 - 1.50 mg/dL Final  . Total Bilirubin 10/25/2018 0.5  0.2 - 1.2 mg/dL Final  . Alkaline Phosphatase 10/25/2018 49  39 - 117 U/L Final  . AST 10/25/2018 21  0 - 37 U/L Final  . ALT 10/25/2018 26  0 - 53 U/L Final  . Total Protein 10/25/2018 7.4  6.0 - 8.3 g/dL Final  . Albumin 10/25/2018 4.3  3.5 - 5.2 g/dL Final  . Calcium 10/25/2018 10.0  8.4 - 10.5 mg/dL Final  . GFR 10/25/2018 82.47  >60.00 mL/min Final  . Hgb A1c MFr Bld 10/25/2018 7.0* 4.6 - 6.5 % Final   Glycemic Control Guidelines for People with Diabetes:Non Diabetic:  <6%Goal of Therapy: <7%Additional Action Suggested:  >8%     Physical Examination:  BP 140/74 (BP Location: Left Arm, Patient Position: Sitting, Cuff Size: Normal)   Pulse 69   Ht 6' (1.829 m)   Wt 281 lb 12.8 oz (127.8 kg)   SpO2 96%   BMI 38.22 kg/m      ASSESSMENT:  Diabetes type 2, non-insulin-dependent  See history of present illness for detailed discussion of current diabetes management, blood sugar patterns and problems identified     His A1c is going up gradually and now 7 Although he is trying to walk and generally watch his diet he is not able to get  enough control with multiple medications including 1 mg Ozempic Still has relatively high fasting readings With his BMI of 38 he does need weight loss   HYPERTENSION: Controlled with current regimen  LIPIDS: Has much better LDL control with Restoril compared to pravastatin   PLAN:   He will increase his Amaryl to 4 mg and try taking this at bedtime instead of dinnertime He will continue to watch readings after meals also and check more often Call if blood sugars are not improved as he may need basal insulin   Patient Instructions  Check blood sugars on waking up days a week  Also check blood sugars about 2 hours after meals and do this after different meals by rotation  Recommended blood sugar levels on waking up are 90-130 and about 2 hours after meal is 130-160  Please bring your blood sugar monitor to each visit, thank you     Elayne Snare 10/27/2018, 9:09 AM   Note: This office note was prepared with Dragon voice recognition system technology. Any transcriptional errors that result from this process are unintentional.

## 2018-10-27 ENCOUNTER — Encounter: Payer: Self-pay | Admitting: Endocrinology

## 2018-10-27 ENCOUNTER — Other Ambulatory Visit: Payer: Self-pay

## 2018-10-27 ENCOUNTER — Ambulatory Visit (INDEPENDENT_AMBULATORY_CARE_PROVIDER_SITE_OTHER): Payer: Medicare Other | Admitting: Endocrinology

## 2018-10-27 VITALS — BP 140/74 | HR 69 | Ht 72.0 in | Wt 281.8 lb

## 2018-10-27 DIAGNOSIS — E1165 Type 2 diabetes mellitus with hyperglycemia: Secondary | ICD-10-CM | POA: Diagnosis not present

## 2018-10-27 DIAGNOSIS — E7800 Pure hypercholesterolemia, unspecified: Secondary | ICD-10-CM

## 2018-10-27 NOTE — Patient Instructions (Addendum)
Check blood sugars on waking up days a week  Also check blood sugars about 2 hours after meals and do this after different meals by rotation  Recommended blood sugar levels on waking up are 90-130 and about 2 hours after meal is 130-160  Please bring your blood sugar monitor to each visit, thank you  Glimeperide 1 at bedtime

## 2018-11-03 ENCOUNTER — Other Ambulatory Visit: Payer: Self-pay | Admitting: Endocrinology

## 2018-11-16 ENCOUNTER — Other Ambulatory Visit: Payer: Self-pay | Admitting: Family Medicine

## 2018-11-16 NOTE — Telephone Encounter (Signed)
Rx sent reflex to Dr. Dwyane Dee note for this med. Follow up with Endo in 3 mo.   *He will increase his Amaryl to 4 mg and try taking this at bedtime instead of dinnertime

## 2018-11-24 ENCOUNTER — Other Ambulatory Visit: Payer: Self-pay | Admitting: Endocrinology

## 2018-12-15 ENCOUNTER — Other Ambulatory Visit: Payer: Self-pay | Admitting: Endocrinology

## 2018-12-18 ENCOUNTER — Other Ambulatory Visit: Payer: Self-pay | Admitting: Endocrinology

## 2018-12-19 ENCOUNTER — Other Ambulatory Visit: Payer: Self-pay | Admitting: Family Medicine

## 2018-12-19 DIAGNOSIS — E118 Type 2 diabetes mellitus with unspecified complications: Secondary | ICD-10-CM

## 2018-12-19 DIAGNOSIS — E7800 Pure hypercholesterolemia, unspecified: Secondary | ICD-10-CM

## 2018-12-19 DIAGNOSIS — Z125 Encounter for screening for malignant neoplasm of prostate: Secondary | ICD-10-CM

## 2018-12-19 NOTE — Telephone Encounter (Signed)
Please call patient and schedule annual physical.  Send back for refill after appointment is scheduled.

## 2018-12-19 NOTE — Telephone Encounter (Signed)
Pt scheduled for In office physical on 12/21/18 with labs on 12/20/18. Pt will need orders for labs.

## 2018-12-20 ENCOUNTER — Other Ambulatory Visit (INDEPENDENT_AMBULATORY_CARE_PROVIDER_SITE_OTHER): Payer: Medicare Other

## 2018-12-20 ENCOUNTER — Other Ambulatory Visit: Payer: Self-pay

## 2018-12-20 DIAGNOSIS — Z125 Encounter for screening for malignant neoplasm of prostate: Secondary | ICD-10-CM

## 2018-12-20 LAB — PSA, MEDICARE: PSA: 0.46 ng/ml (ref 0.10–4.00)

## 2018-12-21 ENCOUNTER — Encounter: Payer: Self-pay | Admitting: Family Medicine

## 2018-12-21 ENCOUNTER — Ambulatory Visit (INDEPENDENT_AMBULATORY_CARE_PROVIDER_SITE_OTHER): Payer: Medicare Other | Admitting: Family Medicine

## 2018-12-21 ENCOUNTER — Other Ambulatory Visit: Payer: Self-pay

## 2018-12-21 VITALS — BP 128/70 | HR 96 | Temp 97.8°F | Ht 69.5 in | Wt 277.6 lb

## 2018-12-21 DIAGNOSIS — E7800 Pure hypercholesterolemia, unspecified: Secondary | ICD-10-CM

## 2018-12-21 DIAGNOSIS — I700 Atherosclerosis of aorta: Secondary | ICD-10-CM

## 2018-12-21 DIAGNOSIS — Z23 Encounter for immunization: Secondary | ICD-10-CM

## 2018-12-21 DIAGNOSIS — E118 Type 2 diabetes mellitus with unspecified complications: Secondary | ICD-10-CM | POA: Diagnosis not present

## 2018-12-21 DIAGNOSIS — Z7189 Other specified counseling: Secondary | ICD-10-CM | POA: Diagnosis not present

## 2018-12-21 DIAGNOSIS — Z0000 Encounter for general adult medical examination without abnormal findings: Secondary | ICD-10-CM | POA: Diagnosis not present

## 2018-12-21 DIAGNOSIS — F3342 Major depressive disorder, recurrent, in full remission: Secondary | ICD-10-CM | POA: Diagnosis not present

## 2018-12-21 DIAGNOSIS — I10 Essential (primary) hypertension: Secondary | ICD-10-CM

## 2018-12-21 DIAGNOSIS — R011 Cardiac murmur, unspecified: Secondary | ICD-10-CM | POA: Diagnosis not present

## 2018-12-21 DIAGNOSIS — Z6841 Body Mass Index (BMI) 40.0 and over, adult: Secondary | ICD-10-CM | POA: Diagnosis not present

## 2018-12-21 NOTE — Assessment & Plan Note (Signed)
Chronic, stable. Continue current regimen. 

## 2018-12-21 NOTE — Assessment & Plan Note (Signed)
Appreciate endo care.  

## 2018-12-21 NOTE — Assessment & Plan Note (Signed)
Harsh sounding. Endorses h/o aortic sclerosis. No recent echo - will order today.

## 2018-12-21 NOTE — Assessment & Plan Note (Signed)
Chronic, improved on rosuvastatin 10mg  - continue The 10-year ASCVD risk score Mikey Bussing DC Brooke Bonito., et al., 2013) is: 20.1%   Values used to calculate the score:     Age: 63 years     Sex: Male     Is Non-Hispanic African American: No     Diabetic: Yes     Tobacco smoker: No     Systolic Blood Pressure: 122 mmHg     Is BP treated: Yes     HDL Cholesterol: 63.6 mg/dL     Total Cholesterol: 158 mg/dL

## 2018-12-21 NOTE — Progress Notes (Signed)
This visit was conducted in person.  BP 128/70 (BP Location: Left Arm, Patient Position: Sitting, Cuff Size: Large)   Pulse 96   Temp 97.8 F (36.6 C) (Tympanic)   Ht 5' 9.5" (1.765 m)   Wt 277 lb 9 oz (125.9 kg)   SpO2 97%   BMI 40.40 kg/m    CC: CPE Subjective:    Patient ID: Evan [name], male    DOB: [birth year], 63 y.o.   MRN: [id number]  HPI: Evan [name] is a 63 y.o. male presenting on 12/21/2018 for No chief complaint on file.    Hearing Screening   '125Hz'  '250Hz'  '500Hz'  '1000Hz'  '2000Hz'  '3000Hz'  '4000Hz'  '6000Hz'  '8000Hz'   Right ear:   40 40 20  0    Left ear:   '25 25 20  ' 0      Visual Acuity Screening   Right eye Left eye Both eyes  Without correction: '20/25 20/15 20/13 '  With correction:     Passes depression and fall screens  DM - followed by Dr Dwyane Dee. Morning readings staying elevated, PM readings staying low. He has been monitoring chol levels as well, on rosuvastatin.  Successful weight loss through intermittent fasting (8pm to 12 noon).  Knee arthritis doing well after PT.   Preventative: COLONOSCOPY Date: 03/2015 2 polyps, rpt 5 yrs [name]) ESOPHAGOGASTRODUODENOSCOPY Date: 03/2015 mild chronic gastritis, esophagitis - on daily PPI  Prostate cancer screening - gets PSA done every few years. Brother with prostate cancer. Nocturia x1 Flu shot yearly Tdap 04/2012  Pneumovax 12/2012. prevnar today Zostavax - declines. Has had recurrent shingles in the past Shingrix - discussed.  Advanced planning discussion - has this at home, currently updating. HCPOA would be wife then oldest child. Asked to bring [place] copy.  Seat belt use discussed.  Sunscreen use discussed. No changing moles on skin.  Non smoker  Alcohol - 2 beers/day 5 times a week. 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 Keeps record of caloric intake     Relevant past medical, surgical, family and social history reviewed and updated as indicated. Interim medical history since our last visit reviewed. Allergies and medications reviewed and updated. Outpatient Medications Prior to Visit  Medication Sig Dispense Refill  . aspirin 81 MG tablet Take 81 mg by mouth daily.      . Blood Glucose Monitoring Suppl (ONETOUCH VERIO FLEX SYSTEM) w/Device KIT 1 kit by Does not apply route daily. Use to check blood sugars 2 times daily 1 kit 2  . Cholecalciferol (VITAMIN D3) 1000 units CAPS Take 1 capsule (1,000 Units total) by mouth daily. 30 capsule   . Cyanocobalamin (VITAMIN B12) 3000 MCG SUBL Place 3,000 mcg under the tongue.    . dapagliflozin propanediol (FARXIGA) 10 MG TABS tablet Take 10 mg by mouth every morning. Take 1 tablet by mouth every morning. 30 tablet 2  . desonide (DESOWEN) 0.05 % cream APPLY TOPICALLY 2 (TWO) TIMES DAILY. APPLY TO AFFECTED AREA 30 g 0  . glimepiride (AMARYL) 4 MG tablet Take 1 tablet (4 mg total) by mouth at bedtime. 90 tablet 0  . glucose blood (ONETOUCH VERIO) test strip Use to check blood sugars 2 times daily 200  each 3  . ibuprofen (ADVIL,MOTRIN) 200 MG tablet Take 200 mg by mouth at bedtime as needed.    Marland Kitchen losartan (COZAAR) 100 MG tablet Take 1 tablet (100 mg total) by mouth daily. 90 tablet 1  . metFORMIN (GLUCOPHAGE) 1000 MG tablet TAKE 1 TABLET BY MOUTH TWICE A DAY WITH MEALS 180 tablet 1  . omeprazole (PRILOSEC) 40 MG capsule TAKE 1 CAPSULE BY MOUTH EVERY DAY AS NEEDED 90 capsule 1  . OZEMPIC, 1 MG/DOSE, 2 MG/1.5ML SOPN INJECT 1MG INTO THE SKIN ONCE A WEEK 4 pen 3  . rosuvastatin (CRESTOR) 10 MG tablet Take 1 tablet (10 mg total) by mouth daily. 90 tablet 3  . sildenafil (REVATIO) 20 MG tablet TAKE 2-5 TABLETS (40-100 MG TOTAL) BY MOUTH DAILY AS NEEDED (RELATIONS). 30  tablet 3  . Turmeric 500 MG CAPS Take by mouth.     No facility-administered medications prior to visit.      Per HPI unless specifically indicated in ROS section below Review of Systems  Constitutional: Negative for activity change, appetite change, chills, fatigue, fever and unexpected weight change.  HENT: Negative for hearing loss.   Eyes: Negative for visual disturbance.  Respiratory: Negative for cough, chest tightness, shortness of breath and wheezing.   Cardiovascular: Negative for chest pain, palpitations and leg swelling.  Gastrointestinal: Negative for abdominal distention, abdominal pain, blood in stool, constipation, diarrhea, nausea and vomiting.  Genitourinary: Negative for difficulty urinating and hematuria.  Musculoskeletal: Negative for arthralgias, myalgias and neck pain.  Skin: Negative for rash.  Neurological: Negative for dizziness, seizures, syncope and headaches.  Hematological: Negative for adenopathy. Does not bruise/bleed easily.  Psychiatric/Behavioral: Negative for dysphoric mood. The patient is not nervous/anxious.    Objective:    BP 128/70 (BP Location: Left Arm, Patient Position: Sitting, Cuff Size: Large)   Pulse 96   Temp 97.8 F (36.6 C) (Tympanic)   Ht 5' 9.5" (1.765 m)   Wt 277 lb 9 oz (125.9 kg)   SpO2 97%   BMI 40.40 kg/m   Wt Readings from Last 3 Encounters:  12/21/18 277 lb 9 oz (125.9 kg)  10/27/18 281 lb 12.8 oz (127.8 kg)  07/25/18 284 lb 6.4 oz (129 kg)    Physical Exam Vitals signs and nursing note reviewed.  Constitutional:      General: He is not in acute distress.    Appearance: Normal appearance. He is well-developed. He is not ill-appearing.  HENT:     Head: Normocephalic and atraumatic.     Right Ear: Hearing, tympanic membrane, ear canal and external ear normal.     Left Ear: Hearing, tympanic membrane, ear canal and external ear normal.     Nose: Nose normal.     Mouth/Throat:     Mouth: Mucous membranes are moist.      Pharynx: Uvula midline. No oropharyngeal exudate or posterior oropharyngeal erythema.  Eyes:     General: No scleral icterus.    Extraocular Movements: Extraocular movements intact.     Conjunctiva/sclera: Conjunctivae normal.     Pupils: Pupils are equal, round, and reactive to light.  Neck:     Musculoskeletal: Normal range of motion and neck supple.     Vascular: Carotid bruit (anticipate referred from murmur) present.  Cardiovascular:     Rate and Rhythm: Normal rate and regular rhythm.     Pulses: Normal pulses.          Radial pulses are 2+ on the right side and 2+ on  the left side.     Heart sounds: Murmur (5/6 harsh systolic throughout chest) present.  Pulmonary:     Effort: Pulmonary effort is normal. No respiratory distress.     Breath sounds: Normal breath sounds. No wheezing, rhonchi or rales.  Abdominal:     General: Bowel sounds are normal. There is no distension.     Palpations: Abdomen is soft. There is no mass.     Tenderness: There is no abdominal tenderness. There is no guarding or rebound.  Musculoskeletal: Normal range of motion.     Right lower leg: No edema.     Left lower leg: No edema.  Lymphadenopathy:     Cervical: No cervical adenopathy.  Skin:    General: Skin is warm and dry.     Findings: No rash.  Neurological:     General: No focal deficit present.     Mental Status: He is alert and oriented to person, place, and time.     Comments:  CN grossly intact, station and gait intact Recall 3/3 Calculation 5/5 serial 7s  Psychiatric:        Mood and Affect: Mood normal.        Behavior: Behavior normal.        Thought Content: Thought content normal.        Judgment: Judgment normal.       Results for orders placed or performed in visit on 12/20/18  PSA, Medicare  Result Value Ref Range   PSA 0.46 0.10 - 4.00 ng/ml   EKG - NSR rate 80s, normal axis, intervals, no acute ST/T changes Assessment & Plan:   Problem List Items Addressed This  Visit    Welcome to Medicare preventive visit - Primary    I have personally reviewed the Medicare Annual Wellness questionnaire and have noted 1. The patient's medical and social history 2. Their use of alcohol, tobacco or illicit drugs 3. Their current medications and supplements 4. The patient's functional ability including ADL's, fall risks, home safety risks and hearing or visual impairment. Cognitive function has been assessed and addressed as indicated.  5. Diet and physical activity 6. Evidence for depression or mood disorders The patients weight, height, BMI have been recorded in the chart. I have made referrals, counseling and provided education to the patient based on review of the above and I have provided the pt with a written personalized care plan for preventive services. Provider list updated.. See scanned questionairre as needed for further documentation. Reviewed preventative protocols and updated unless pt declined.       Relevant Orders   EKG 12-Lead (Completed)   Systolic murmur    Harsh sounding. Endorses h/o aortic sclerosis. No recent echo - will order today.       Morbid obesity with BMI of 40.0-44.9, adult ([place])    Congratulated on weight loss to date. Continues intermittent fasting with success.       MDD (major depressive disorder), recurrent, in full remission ([place])    Stable period off antidepressants.       Hypercholesterolemia    Chronic, improved on rosuvastatin 87m - continue The 10-year ASCVD risk score (Mikey BussingDC Jr., et al., 2013) is: 20.1%   Values used to calculate the score:     Age: [age]years     Sex: Male     Is Non-Hispanic African American: No     Diabetic: Yes     Tobacco smoker: No     Systolic Blood Pressure:  128 mmHg     Is BP treated: Yes     HDL Cholesterol: 63.6 mg/dL     Total Cholesterol: 158 mg/dL       Essential hypertension    Chronic, stable. Continue current regimen.       Controlled diabetes mellitus type 2 with  complications ([place])    Appreciate endo care.      Aortic atherosclerosis ([facility])    Continue aspirin, statin.       Advanced care planning/counseling discussion    Advanced planning discussion - has this at home, currently updating. HCPOA would be wife then oldest child. Asked to bring [place] copy.        Other Visit Diagnoses    Need for vaccination with 13-polyvalent pneumococcal conjugate vaccine       Relevant Orders   Pneumococcal conjugate vaccine 13-valent IM (Completed)       No orders of the defined types were placed in this encounter.  Orders Placed This Encounter  Procedures  . Pneumococcal conjugate vaccine 13-valent IM  . EKG 12-Lead    Follow up plan: Return in about 1 year (around 12/21/2019) for medicare wellness visit, follow up visit.  Ria Bush, MD

## 2018-12-21 NOTE — Patient Instructions (Addendum)
Prevnar 13 today EKG today We will refer you for heart ultrasound  If interested, check with pharmacy about new 2 shot shingles series (shingrix).  Good to see you today, call us with questions. Return as needed or in 1 year for next wellness visit.   Health Maintenance After Age 65 After age 51, you are at a higher risk for certain long-term diseases and infections as well as injuries from falls. Falls are a major cause of broken bones and head injuries in people who are older than age 36. Getting regular preventive care can help to keep you healthy and well. Preventive care includes getting regular testing and making lifestyle changes as recommended by your health care provider. Talk with your health care provider about:  Which screenings and tests you should have. A screening is a test that checks for a disease when you have no symptoms.  A diet and exercise plan that is right for you. What should I know about screenings and tests to prevent falls? Screening and testing are the best ways to find a health problem early. Early diagnosis and treatment give you the best chance of managing medical conditions that are common after age 5. Certain conditions and lifestyle choices may make you more likely to have a fall. Your health care provider may recommend:  Regular vision checks. Poor vision and conditions such as cataracts can make you more likely to have a fall. If you wear glasses, make sure to get your prescription updated if your vision changes.  Medicine review. Work with your health care provider to regularly review all of the medicines you are taking, including over-the-counter medicines. Ask your health care provider about any side effects that may make you more likely to have a fall. Tell your health care provider if any medicines that you take make you feel dizzy or sleepy.  Osteoporosis screening. Osteoporosis is a condition that causes the bones to get weaker. This can make the bones  weak and cause them to break more easily.  Blood pressure screening. Blood pressure changes and medicines to control blood pressure can make you feel dizzy.  Strength and balance checks. Your health care provider may recommend certain tests to check your strength and balance while standing, walking, or changing positions.  Foot health exam. Foot pain and numbness, as well as not wearing proper footwear, can make you more likely to have a fall.  Depression screening. You may be more likely to have a fall if you have a fear of falling, feel emotionally low, or feel unable to do activities that you used to do.  Alcohol use screening. Using too much alcohol can affect your balance and may make you more likely to have a fall. What actions can I take to lower my risk of falls? General instructions  Talk with your health care provider about your risks for falling. Tell your health care provider if: ? You fall. Be sure to tell your health care provider about all falls, even ones that seem minor. ? You feel dizzy, sleepy, or off-balance.  Take over-the-counter and prescription medicines only as told by your health care provider. These include any supplements.  Eat a healthy diet and maintain a healthy weight. A healthy diet includes low-fat dairy products, low-fat (lean) meats, and fiber from whole grains, beans, and lots of fruits and vegetables. Home safety  Remove any tripping hazards, such as rugs, cords, and clutter.  Install safety equipment such as grab bars in bathrooms and  safety rails on stairs.  Keep rooms and walkways well-lit. Activity   Follow a regular exercise program to stay fit. This will help you maintain your balance. Ask your health care provider what types of exercise are appropriate for you.  If you need a cane or walker, use it as recommended by your health care provider.  Wear supportive shoes that have nonskid soles. Lifestyle  Do not drink alcohol if your  health care provider tells you not to drink.  If you drink alcohol, limit how much you have: ? 0-1 drink a day for women. ? 0-2 drinks a day for men.  Be aware of how much alcohol is in your drink. In the [place] one drink equals one typical bottle of beer (12 oz), one-half glass of wine (5 oz), or one shot of hard liquor (1 oz).  Do not use any products that contain nicotine or tobacco, such as cigarettes and e-cigarettes. If you need help quitting, ask your health care provider. Summary  Having a healthy lifestyle and getting preventive care can help to protect your health and wellness after age 25.  Screening and testing are the best way to find a health problem early and help you avoid having a fall. Early diagnosis and treatment give you the best chance for managing medical conditions that are more common for people who are older than age 51.  Falls are a major cause of broken bones and head injuries in people who are older than age 66. Take precautions to prevent a fall at home.  Work with your health care provider to learn what changes you can make to improve your health and wellness and to prevent falls. This information is not intended to replace advice given to you by your health care provider. Make sure you discuss any questions you have with your health care provider. Document Released: 04/14/2017 Document Revised: 09/22/2018 Document Reviewed: 04/14/2017 Elsevier Patient Education  2020 [employer].

## 2018-12-21 NOTE — Assessment & Plan Note (Signed)
Advanced planning discussion - has this at home, currently updating. HCPOA would be wife then oldest child. Asked to bring us copy. 

## 2018-12-21 NOTE — Assessment & Plan Note (Signed)
Stable period off antidepressants.

## 2018-12-21 NOTE — Assessment & Plan Note (Signed)
Congratulated on weight loss to date. Continues intermittent fasting with success.

## 2018-12-21 NOTE — Assessment & Plan Note (Signed)

## 2018-12-21 NOTE — Assessment & Plan Note (Signed)
Continue aspirin, statin.  

## 2019-01-23 ENCOUNTER — Other Ambulatory Visit: Payer: Self-pay

## 2019-01-23 ENCOUNTER — Other Ambulatory Visit (INDEPENDENT_AMBULATORY_CARE_PROVIDER_SITE_OTHER): Payer: Medicare Other

## 2019-01-23 DIAGNOSIS — E1165 Type 2 diabetes mellitus with hyperglycemia: Secondary | ICD-10-CM | POA: Diagnosis not present

## 2019-01-23 LAB — HEMOGLOBIN A1C: Hgb A1c MFr Bld: 6.8 % — ABNORMAL HIGH (ref 4.6–6.5)

## 2019-01-23 LAB — BASIC METABOLIC PANEL
BUN: 14 mg/dL (ref 6–23)
CO2: 25 mEq/L (ref 19–32)
Calcium: 9.7 mg/dL (ref 8.4–10.5)
Chloride: 102 mEq/L (ref 96–112)
Creatinine, Ser: 0.88 mg/dL (ref 0.40–1.50)
GFR: 86.75 mL/min (ref >60.00)
Glucose, Bld: 137 mg/dL — ABNORMAL HIGH (ref 70–99)
Potassium: 4.4 mEq/L (ref 3.5–5.1)
Sodium: 139 mEq/L (ref 135–145)

## 2019-01-26 ENCOUNTER — Encounter: Payer: Self-pay | Admitting: Endocrinology

## 2019-01-26 ENCOUNTER — Other Ambulatory Visit: Payer: Self-pay

## 2019-01-26 ENCOUNTER — Ambulatory Visit (INDEPENDENT_AMBULATORY_CARE_PROVIDER_SITE_OTHER): Payer: Medicare Other | Admitting: Endocrinology

## 2019-01-26 VITALS — BP 138/78 | HR 91 | Ht 69.5 in | Wt 277.0 lb

## 2019-01-26 DIAGNOSIS — E1165 Type 2 diabetes mellitus with hyperglycemia: Secondary | ICD-10-CM | POA: Diagnosis not present

## 2019-01-26 NOTE — Patient Instructions (Signed)
Check blood sugars on waking up 3 days a week  Also check blood sugars about 2 hours after meals and do this after different meals by rotation  Recommended blood sugar levels on waking up are 90-130 and about 2 hours after meal is 130-160  Please bring your blood sugar monitor to each visit, thank you   

## 2019-01-26 NOTE — Progress Notes (Signed)
Patient ID: Evan [name], male   DOB: [birth year], 63 y.o.   MRN: [id number]          Reason for Appointment: Follow-up for Type 2 Diabetes   History of Present Illness:          Date of diagnosis of type 2 diabetes mellitus: 2012        Background history:   Diagnosed incidentally with a high blood sugar Has been on metformin since diagnosis and subsequently Amaryl had been added He has had sporadic follow-up for the last few years His A1c had been below 7 to about 08/2015 Subsequently in 9/17 his A1c had gone up to 8%  Recent history:      Non-insulin hypoglycemic drugs : Farxiga 10 mg, Amaryl 47m hs in the pm., metformin 1 g twice a day, Ozempic 1 mg weekly  His A1c previously was gradually increasing up to 7%  Is now better at 6.8   Current management, blood sugar patterns and problems identified:  He has taken Amaryl 4 mg at bedtime since his last visit in 5/20  As before his fasting readings are the highest in the day by recall  Usually does not have higher readings after meals when he checks them  Also his blood sugar can be as low as 80  He has lost 4 pounds  He is doing well with exercising up to 45 minutes, 5 days a week  He is now concerned about his Ozempic costing list price, possibly from being in the donut hole  Has no side effects like nausea from Ozempic or diarrhea from metformin        Side effects from medications have been: None  Glucose monitoring:  done 1 or less times a day         Glucometer: One Touch Verio .      Blood Glucose readings and median readings by recall   PRE-MEAL Fasting Lunch Dinner Bedtime Overall  Glucose range: 145-160   80-125   Mean/median:        Previous readings on download:  PRE-MEAL Fasting Lunch Dinner Bedtime Overall  Glucose range:  113-161     98-173  Mean/median:  151   127  122  141   POST-MEAL PC Breakfast PC Lunch PC Dinner  Glucose range:     Mean/median:  166       Self-care: The diet that  the patient has been following is: tries to limit [employer] Carbohydrates and avoiding drinks with sugar .      Typical meal intake: Breakfast is eggs and cheese.  Lunch is usually [place].  Dinner: meat and vegetables    His snacks are fruits like apples and nuts             Dietician visit, most recent: 02/2017               Exercise:  45 walking during the day almost daily  Weight history:  Wt Readings from Last 3 Encounters:  01/26/19 277 lb (125.6 kg)  12/21/18 277 lb 9 oz (125.9 kg)  10/27/18 281 lb 12.8 oz (127.8 kg)    Glycemic control:   Lab Results  Component Value Date   HGBA1C 6.8 (H) 01/23/2019   HGBA1C 7.0 (H) 10/25/2018   HGBA1C 6.7 (H) 07/20/2018   Lab Results  Component Value Date   MICROALBUR <0.7 07/20/2018   LDLCALC 60 10/25/2018   CREATININE 0.88 01/23/2019   Lab Results  Component Value Date   MICRALBCREAT 1.5 07/20/2018    Lab Results  Component Value Date   FRUCTOSAMINE 239 12/13/2017   FRUCTOSAMINE 268 03/12/2017      Allergies as of 01/26/2019      Reactions   Penicillins    Respiratory distress as infant      Medication List       Accurate as of January 26, 2019  8:38 AM. If you have any questions, ask your nurse or doctor.        aspirin 81 MG tablet Take 81 mg by mouth daily.   desonide 0.05 % cream Commonly known as: DESOWEN APPLY TOPICALLY 2 (TWO) TIMES DAILY. APPLY TO AFFECTED AREA   Farxiga 10 MG Tabs tablet Generic drug: dapagliflozin propanediol Take 10 mg by mouth every morning. Take 1 tablet by mouth every morning.   glimepiride 4 MG tablet Commonly known as: AMARYL Take 1 tablet (4 mg total) by mouth at bedtime.   glucose blood test strip Commonly known as: OneTouch Verio Use to check blood sugars 2 times daily   ibuprofen 200 MG tablet Commonly known as: ADVIL Take 200 mg by mouth at bedtime as needed.   losartan 100 MG tablet Commonly known as: COZAAR Take 1 tablet (100 mg total) by mouth  daily.   metFORMIN 1000 MG tablet Commonly known as: GLUCOPHAGE TAKE 1 TABLET BY MOUTH TWICE A DAY WITH MEALS   omeprazole 40 MG capsule Commonly known as: PRILOSEC TAKE 1 CAPSULE BY MOUTH EVERY DAY AS NEEDED   OneTouch Verio Flex System w/Device Kit 1 kit by Does not apply route daily. Use to check blood sugars 2 times daily   Ozempic (1 MG/DOSE) 2 MG/1.5ML Sopn Generic drug: Semaglutide (1 MG/DOSE) INJECT 1MG INTO THE SKIN ONCE A WEEK   rosuvastatin 10 MG tablet Commonly known as: Crestor Take 1 tablet (10 mg total) by mouth daily.   sildenafil 20 MG tablet Commonly known as: REVATIO TAKE 2-5 TABLETS (40-100 MG TOTAL) BY MOUTH DAILY AS NEEDED (RELATIONS).   Turmeric 500 MG Caps Take by mouth.   Vitamin B12 3000 MCG Subl Place 3,000 mcg under the tongue.   Vitamin D3 25 MCG (1000 UT) Caps Take 1 capsule (1,000 Units total) by mouth daily.       Allergies:  Allergies  Allergen Reactions  . Penicillins     Respiratory distress as infant    Past Medical History:  Diagnosis Date  . Depression    intolerant of cymbalta and wellbutrin  . Diverticulosis of colon   . Esophagitis 03/2015   by EGD  . Gastritis 03/2015   by EGD - mild, chronic  . Hyperlipidemia   . Hypertension   . Lichen simplex chronicus 05/2015   R scrotum [name])  . Murmur    aortic sclerosis, SBE  prophlaxis not needed as per cardio  . NAFLD (nonalcoholic fatty liver disease) 01/2015   by [place]  . 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 T2DM (type 2 diabetes mellitus) ([place])    DSME 09/2013    Past Surgical History:  Procedure Laterality Date  . COLONOSCOPY  2006   diverticulosis per pt  . COLONOSCOPY  03/2015   2 polyps, rpt 5 yrs [name])  . ESOPHAGOGASTRODUODENOSCOPY  03/2015   mild chronic gastritis, esophagitis   . LUMBAR LAMINECTOMY  1989   L5-S1    Family History  Problem Relation Age of Onset  . Alzheimer's disease Father   .  Hypertension Father   .  Stroke Father   . Hyperlipidemia Father   . Coronary artery disease Brother 56       diabetic, MI  . Prostate cancer Brother   . Diabetes Brother   . Coronary artery disease Maternal Uncle   . Schizophrenia Paternal Uncle   . Emphysema Mother     Social History:  reports that he has never smoked. He has never used smokeless tobacco. He reports current alcohol use. He reports that he does not use drugs.   Review of Systems  Lipid history: With switching from pravastatin to Crestor 10 mg his LDL is much better at 60      Lab Results  Component Value Date   CHOL 158 10/25/2018   HDL 63.60 10/25/2018   LDLCALC 60 10/25/2018   LDLDIRECT 99.0 05/20/2015   TRIG 175.0 (H) 10/25/2018   CHOLHDL 2 10/25/2018           Hypertension: On Losartan 100 mg, 1/2 Tablet with good control  BP Readings from Last 3 Encounters:  01/26/19 138/78  12/21/18 128/70  10/27/18 140/74     FATTY liver: His ALT more recently is normal   Lab Results  Component Value Date   ALT 26 10/25/2018     Most recent foot exam: 03/2018 Need to get reports of his last eye exam   LABS:  Lab on 01/23/2019  Component Date Value Ref Range Status  . Sodium 01/23/2019 139  135 - 145 mEq/L Final  . Potassium 01/23/2019 4.4  3.5 - 5.1 mEq/L Final  . Chloride 01/23/2019 102  96 - 112 mEq/L Final  . CO2 01/23/2019 25  19 - 32 mEq/L Final  . Glucose, Bld 01/23/2019 137* 70 - 99 mg/dL Final  . BUN 01/23/2019 14  6 - 23 mg/dL Final  . Creatinine, Ser 01/23/2019 0.88  0.40 - 1.50 mg/dL Final  . Calcium 01/23/2019 9.7  8.4 - 10.5 mg/dL Final  . GFR 01/23/2019 86.75  >60.00 mL/min Final  . Hgb A1c MFr Bld 01/23/2019 6.8* 4.6 - 6.5 % Final   Glycemic Control Guidelines for People with Diabetes:Non Diabetic:  <6%Goal of Therapy: <7%Additional Action Suggested:  >8%     Physical Examination:  BP 138/78 (BP Location: Left Arm, Patient Position: Sitting, Cuff Size: Large)   Pulse 91   Ht 5' 9.5" (1.765 m)    Wt 277 lb (125.6 kg)   SpO2 98%   BMI 40.32 kg/m      ASSESSMENT:  Diabetes type 2, non-insulin-dependent  See history of present illness for detailed discussion of current diabetes management, blood sugar patterns and problems identified     A1c 6.8  He is on multiple drugs including Farxiga and Ozempic Even with increasing Amaryl to 4 mg his fasting readings tend to be high but has good readings the rest of the time as per his weight call  Although he is trying to walk and generally watch his diet he is not able to get enough control with multiple medications including 1 mg Ozempic Still has relatively high fasting readings   HYPERTENSION: Controlled with losartan 50 mg regimen and no change in renal function    PLAN:   He will stay on the same medication regimen Patient assistance program information was given He will also check with his insurance to find out if his formulary covers another drug instead of Ozempic  Discussed blood sugar targets at various meals and explained why his fasting readings  tend to be high However if he is not able to get Ozempic affordably may have to do a basal insulin such as Lantus Continue regular program of exercise To bring monitor on his next visit in 4 months   There are no Patient Instructions on file for this visit.  Elayne Snare 01/26/2019, 8:38 AM   Note: This office note was prepared with Dragon voice recognition system technology. Any transcriptional errors that result from this process are unintentional.

## 2019-02-04 ENCOUNTER — Other Ambulatory Visit: Payer: Self-pay | Admitting: Family Medicine

## 2019-02-06 NOTE — Telephone Encounter (Signed)
Does this request need to go to Dr Cammy Copa? We filled last time on 11/16/2018

## 2019-02-13 ENCOUNTER — Other Ambulatory Visit: Payer: Self-pay | Admitting: Family Medicine

## 2019-02-15 DIAGNOSIS — H524 Presbyopia: Secondary | ICD-10-CM | POA: Diagnosis not present

## 2019-02-15 DIAGNOSIS — E119 Type 2 diabetes mellitus without complications: Secondary | ICD-10-CM | POA: Diagnosis not present

## 2019-02-15 LAB — HM DIABETES EYE EXAM

## 2019-02-17 ENCOUNTER — Encounter: Payer: Self-pay | Admitting: Family Medicine

## 2019-02-23 ENCOUNTER — Other Ambulatory Visit: Payer: Self-pay

## 2019-02-23 ENCOUNTER — Encounter: Payer: Self-pay | Admitting: Family Medicine

## 2019-02-23 DIAGNOSIS — J029 Acute pharyngitis, unspecified: Secondary | ICD-10-CM

## 2019-02-23 DIAGNOSIS — Z20822 Contact with and (suspected) exposure to covid-19: Secondary | ICD-10-CM

## 2019-02-23 NOTE — Telephone Encounter (Signed)
Spoke with patient. Chest tightness occurred last night while watching TV, brief and has not recurred. Ongoing ST, temp 99.  No cough, loss of taste or smell or abd pain or nausea.  No known covid exposure.  BP, sugars running well.  Lab Results  Component Value Date   HGBA1C 6.8 (H) 01/23/2019    Reasonable to send for Covid testing. Reviewed testing protocol at [facility].

## 2019-02-23 NOTE — Telephone Encounter (Signed)
Spoke with pt to get more details for sxs.  States yesterday he started with a sore throat, some mild chest tightness, low-grade fever (99.0) and mild SOB.   Denies being exposed to anyone with positive COVID test.  But he did go to church for the first time recently and wore his mask.  Advised pt to seek emergent care but he want so get Dr. Synthia Innocent opinion first.  Pls advise.  Pt can be reached at [phone] and gives permission to lvm.

## 2019-02-23 NOTE — Addendum Note (Signed)
Addended by: Ria Bush on: 02/23/2019 02:03 PM   Modules accepted: Orders

## 2019-02-25 LAB — NOVEL CORONAVIRUS, NAA: SARS-CoV-2, NAA: NOT DETECTED

## 2019-04-08 ENCOUNTER — Other Ambulatory Visit: Payer: Self-pay | Admitting: Endocrinology

## 2019-05-30 ENCOUNTER — Other Ambulatory Visit: Payer: Self-pay

## 2019-05-30 ENCOUNTER — Other Ambulatory Visit (INDEPENDENT_AMBULATORY_CARE_PROVIDER_SITE_OTHER): Payer: Medicare Other

## 2019-05-30 DIAGNOSIS — E1165 Type 2 diabetes mellitus with hyperglycemia: Secondary | ICD-10-CM | POA: Diagnosis not present

## 2019-05-30 LAB — COMPREHENSIVE METABOLIC PANEL
ALT: 27 U/L (ref 0–53)
AST: 21 U/L (ref 0–37)
Albumin: 4.3 g/dL (ref 3.5–5.2)
Alkaline Phosphatase: 43 U/L (ref 39–117)
BUN: 14 mg/dL (ref 6–23)
CO2: 28 mEq/L (ref 19–32)
Calcium: 9.4 mg/dL (ref 8.4–10.5)
Chloride: 101 mEq/L (ref 96–112)
Creatinine, Ser: 0.95 mg/dL (ref 0.40–1.50)
GFR: 79.33 mL/min (ref >60.00)
Glucose, Bld: 143 mg/dL — ABNORMAL HIGH (ref 70–99)
Potassium: 4.6 mEq/L (ref 3.5–5.1)
Sodium: 139 mEq/L (ref 135–145)
Total Bilirubin: 0.3 mg/dL (ref 0.2–1.2)
Total Protein: 7.2 g/dL (ref 6.0–8.3)

## 2019-05-30 LAB — HEMOGLOBIN A1C: Hgb A1c MFr Bld: 7 % — ABNORMAL HIGH (ref 4.6–6.5)

## 2019-06-01 ENCOUNTER — Ambulatory Visit (INDEPENDENT_AMBULATORY_CARE_PROVIDER_SITE_OTHER): Payer: Medicare Other | Admitting: Endocrinology

## 2019-06-01 ENCOUNTER — Encounter: Payer: Self-pay | Admitting: Endocrinology

## 2019-06-01 ENCOUNTER — Other Ambulatory Visit: Payer: Self-pay

## 2019-06-01 DIAGNOSIS — I10 Essential (primary) hypertension: Secondary | ICD-10-CM | POA: Diagnosis not present

## 2019-06-01 DIAGNOSIS — E1165 Type 2 diabetes mellitus with hyperglycemia: Secondary | ICD-10-CM

## 2019-06-01 DIAGNOSIS — E7800 Pure hypercholesterolemia, unspecified: Secondary | ICD-10-CM | POA: Diagnosis not present

## 2019-06-01 NOTE — Progress Notes (Signed)
Patient ID: Evan [name], male   DOB: 1956-05-05, 63 y.o.   MRN: [id number]          Reason for Appointment: Follow-up for Type 2 Diabetes  I connected with the above-named patient by video enabled telemedicine application and verified that I am speaking with the correct person. The patient was explained the limitations of evaluation and management by telemedicine and the availability of in person appointments.  Patient also understood that there may be a patient responsible charge related to this service . Location of the patient: Patient's home . Location of the provider: Physician office Only the patient and myself were participating in the encounter The patient understood the above statements and agreed to proceed.  History of Present Illness:          Date of diagnosis of type 2 diabetes mellitus: 2012        Background history:   Diagnosed incidentally with a high blood sugar Has been on metformin since diagnosis and subsequently Amaryl had been added He has had sporadic follow-up for the last few years His A1c had been below 7 to about 08/2015 Subsequently in 9/17 his A1c had gone up to 8%  Recent history:      Non-insulin hypoglycemic drugs : Farxiga 10 mg, Amaryl 65m hs in the pm., metformin 1 g twice a day, Ozempic 1 mg weekly   His A1c has been variable and now back up to 7%  Current management, blood sugar patterns and problems identified:  He has fasting hyperglycemia as before although not clear if blood sugars are higher than before  The highest reading is 161 but has only 2 readings this month  He has taken Amaryl 4 mg at bedtime consistently  Blood sugars at bedtime are not high  Although he has been less active and not doing as much walking as before his home blood sugars do not appear any different  He was previously walking 45 minutes almost daily  He is reporting that his weight is about the same as before  Not having any difficulties with coverage  for his Ozempic currently  No nausea or other GI side effects with Ozempic which he takes every week regularly        Side effects from medications have been: None  Glucose monitoring:  done 1 or less times a day         Glucometer: One Touch Verio .      Blood Glucose readings by review of monitor from home   PRE-MEAL Fasting Lunch Dinner Bedtime Overall  Glucose range:  152, 161  108  124, 152  101-159  101-161  Mean/median:     ?   POST-MEAL PC Breakfast PC Lunch PC Dinner  Glucose range:  121  148   Mean/median:       PREVIOUS readings:  PRE-MEAL Fasting Lunch Dinner Bedtime Overall  Glucose range: 145-160   80-125   Mean/median:          Self-care: The diet that the patient has been following is: tries to limit [employer] Carbohydrates and avoiding drinks with sugar .      Typical meal intake: Breakfast is eggs and cheese.  Lunch is usually [place].  Dinner: meat and vegetables    His snacks are fruits like apples and nuts             Dietician visit, most recent: 02/2017  Weight history:  Wt Readings from Last 3 Encounters:  01/26/19 277 lb (125.6 kg)  12/21/18 277 lb 9 oz (125.9 kg)  10/27/18 281 lb 12.8 oz (127.8 kg)    Glycemic control:   Lab Results  Component Value Date   HGBA1C 7.0 (H) 05/30/2019   HGBA1C 6.8 (H) 01/23/2019   HGBA1C 7.0 (H) 10/25/2018   Lab Results  Component Value Date   MICROALBUR <0.7 07/20/2018   LDLCALC 60 10/25/2018   CREATININE 0.95 05/30/2019   Lab Results  Component Value Date   MICRALBCREAT 1.5 07/20/2018    Lab Results  Component Value Date   FRUCTOSAMINE 239 12/13/2017   FRUCTOSAMINE 268 03/12/2017      Allergies as of 06/01/2019      Reactions   Penicillins    Respiratory distress as infant      Medication List       Accurate as of June 01, 2019  8:29 AM. If you have any questions, ask your nurse or doctor.        aspirin 81 MG tablet Take 81 mg by mouth daily.   desonide 0.05 %  cream Commonly known as: DESOWEN APPLY TOPICALLY 2 (TWO) TIMES DAILY. APPLY TO AFFECTED AREA   Farxiga 10 MG Tabs tablet Generic drug: dapagliflozin propanediol Take 10 mg by mouth every morning. Take 1 tablet by mouth every morning.   glimepiride 4 MG tablet Commonly known as: AMARYL TAKE 1 TABLET (4 MG TOTAL) BY MOUTH AT BEDTIME.   glucose blood test strip Commonly known as: OneTouch Verio Use to check blood sugars 2 times daily   ibuprofen 200 MG tablet Commonly known as: ADVIL Take 200 mg by mouth at bedtime as needed.   losartan 100 MG tablet Commonly known as: COZAAR TAKE 1 TABLET BY MOUTH EVERY DAY   metFORMIN 1000 MG tablet Commonly known as: GLUCOPHAGE TAKE 1 TABLET BY MOUTH TWICE A DAY WITH MEALS   omeprazole 40 MG capsule Commonly known as: PRILOSEC TAKE 1 CAPSULE BY MOUTH EVERY DAY AS NEEDED   OneTouch Verio Flex System w/Device Kit 1 kit by Does not apply route daily. Use to check blood sugars 2 times daily   Ozempic (1 MG/DOSE) 2 MG/1.5ML Sopn Generic drug: Semaglutide (1 MG/DOSE) INJECT 1MG INTO THE SKIN ONCE A WEEK   rosuvastatin 10 MG tablet Commonly known as: Crestor Take 1 tablet (10 mg total) by mouth daily.   sildenafil 20 MG tablet Commonly known as: REVATIO TAKE 2-5 TABLETS (40-100 MG TOTAL) BY MOUTH DAILY AS NEEDED (RELATIONS).   Turmeric 500 MG Caps Take by mouth.   Vitamin B12 3000 MCG Subl Place 3,000 mcg under the tongue.   Vitamin D3 25 MCG (1000 UT) Caps Take 1 capsule (1,000 Units total) by mouth daily.       Allergies:  Allergies  Allergen Reactions  . Penicillins     Respiratory distress as infant    Past Medical History:  Diagnosis Date  . Depression    intolerant of cymbalta and wellbutrin  . Diverticulosis of colon   . Esophagitis 03/2015   by EGD  . Gastritis 03/2015   by EGD - mild, chronic  . Hyperlipidemia   . Hypertension   . Lichen simplex chronicus 05/2015   R scrotum [name])  . Murmur     aortic sclerosis, SBE  prophlaxis not needed as per cardio  . NAFLD (nonalcoholic fatty liver disease) 01/2015   by [place]  . 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 T2DM (type 2 diabetes  mellitus) ([place])    DSME 09/2013    Past Surgical History:  Procedure Laterality Date  . COLONOSCOPY  2006   diverticulosis per pt  . COLONOSCOPY  03/2015   2 polyps, rpt 5 yrs [name])  . ESOPHAGOGASTRODUODENOSCOPY  03/2015   mild chronic gastritis, esophagitis   . LUMBAR LAMINECTOMY  1989   L5-S1    Family History  Problem Relation Age of Onset  . Alzheimer's disease Father   . Hypertension Father   . Stroke Father   . Hyperlipidemia Father   . Coronary artery disease Brother 86       diabetic, MI  . Prostate cancer Brother   . Diabetes Brother   . Coronary artery disease Maternal Uncle   . Schizophrenia Paternal Uncle   . Emphysema Mother     Social History:  reports that he has never smoked. He has never used smokeless tobacco. He reports current alcohol use. He reports that he does not use drugs.   Review of Systems  Lipid history: On Crestor 10 mg and his LDL is much better at 60      Lab Results  Component Value Date   CHOL 158 10/25/2018   HDL 63.60 10/25/2018   LDLCALC 60 10/25/2018   LDLDIRECT 99.0 05/20/2015   TRIG 175.0 (H) 10/25/2018   CHOLHDL 2 10/25/2018           Hypertension: On Losartan 100 mg, 1/2 Tablet with good control Blood pressure has been around 782+ systolic at home No lightheadedness but he felt vertigo-like sensation once while driving  BP Readings from Last 3 Encounters:  01/26/19 138/78  12/21/18 128/70  10/27/18 140/74    FATTY liver: His ALT recently is normal   Lab Results  Component Value Date   ALT 27 05/30/2019     Most recent foot exam: 03/2018 Need to get reports of his last eye exam   LABS:  Lab on 05/30/2019  Component Date Value Ref Range Status  . Sodium 05/30/2019 139  135 - 145 mEq/L Final  .  Potassium 05/30/2019 4.6  3.5 - 5.1 mEq/L Final  . Chloride 05/30/2019 101  96 - 112 mEq/L Final  . CO2 05/30/2019 28  19 - 32 mEq/L Final  . Glucose, Bld 05/30/2019 143* 70 - 99 mg/dL Final  . BUN 05/30/2019 14  6 - 23 mg/dL Final  . Creatinine, Ser 05/30/2019 0.95  0.40 - 1.50 mg/dL Final  . Total Bilirubin 05/30/2019 0.3  0.2 - 1.2 mg/dL Final  . Alkaline Phosphatase 05/30/2019 43  39 - 117 U/L Final  . AST 05/30/2019 21  0 - 37 U/L Final  . ALT 05/30/2019 27  0 - 53 U/L Final  . Total Protein 05/30/2019 7.2  6.0 - 8.3 g/dL Final  . Albumin 05/30/2019 4.3  3.5 - 5.2 g/dL Final  . GFR 05/30/2019 79.33  >60.00 mL/min Final  . Calcium 05/30/2019 9.4  8.4 - 10.5 mg/dL Final  . Hgb A1c MFr Bld 05/30/2019 7.0* 4.6 - 6.5 % Final   Glycemic Control Guidelines for People with Diabetes:Non Diabetic:  <6%Goal of Therapy: <7%Additional Action Suggested:  >8%     Physical Examination:  There were no vitals taken for this visit.     ASSESSMENT:  Diabetes type 2, non-insulin-dependent  See history of present illness for detailed discussion of current diabetes management, blood sugar patterns and problems identified     A1c 7%, was 6.8  He is on 4 drug regimen  including Farxiga and Ozempic Highest readings are again in the morning on waking up although blood sugars at bedtime are as low as 101 Not clear if he has gained any weight However has slowed down on his walking program Discussed long-term benefits of keeping his weight down  He is taking his medication regimen consistently and is able to afford his medications now   HYPERTENSION: Controlled with losartan 50 mg regimen with home readings in the normal range    PLAN:    Continue 4 drug regimen for diabetes No change in Amaryl 4 mg dosage which he will continue to take at bedtime However if he has any tendency to low sugars we will consider reducing the dose  Encouraged him to do indoor exercises if he cannot go out to  walk as much Call if blood pressures are getting any lower but meanwhile continue half tablet of losartan Occasionally check blood pressure standing   There are no Patient Instructions on file for this visit.  Elayne Snare 06/01/2019, 8:29 AM   Note: This office note was prepared with Dragon voice recognition system technology. Any transcriptional errors that result from this process are unintentional.

## 2019-06-20 ENCOUNTER — Other Ambulatory Visit: Payer: Self-pay | Admitting: Family Medicine

## 2019-06-20 MED ORDER — GLIMEPIRIDE 4 MG PO TABS: 4.0000 mg | ORAL_TABLET | Freq: Every day | ORAL | 0 refills | Status: DC

## 2019-06-21 ENCOUNTER — Other Ambulatory Visit: Payer: Self-pay | Admitting: Family Medicine

## 2019-07-15 ENCOUNTER — Other Ambulatory Visit: Payer: Self-pay | Admitting: Endocrinology

## 2019-07-18 ENCOUNTER — Telehealth: Payer: Self-pay

## 2019-07-18 NOTE — Telephone Encounter (Signed)
Scheduled patient for 09/04/19 for labs and 09/07/19 for visit

## 2019-07-27 ENCOUNTER — Ambulatory Visit: Payer: Self-pay

## 2019-08-30 ENCOUNTER — Other Ambulatory Visit: Payer: Self-pay | Admitting: Endocrinology

## 2019-09-04 ENCOUNTER — Other Ambulatory Visit (INDEPENDENT_AMBULATORY_CARE_PROVIDER_SITE_OTHER): Payer: Medicare Other

## 2019-09-04 ENCOUNTER — Other Ambulatory Visit: Payer: Self-pay

## 2019-09-04 ENCOUNTER — Ambulatory Visit: Payer: Medicare Other | Admitting: Endocrinology

## 2019-09-04 DIAGNOSIS — E1165 Type 2 diabetes mellitus with hyperglycemia: Secondary | ICD-10-CM

## 2019-09-04 DIAGNOSIS — E7800 Pure hypercholesterolemia, unspecified: Secondary | ICD-10-CM | POA: Diagnosis not present

## 2019-09-04 LAB — MICROALBUMIN / CREATININE URINE RATIO
Creatinine,U: 16.2 mg/dL
Microalb Creat Ratio: 4.3 mg/g (ref 0.0–30.0)
Microalb, Ur: <0.7 mg/dL (ref 0.0–1.9)

## 2019-09-04 LAB — LIPID PANEL
Cholesterol: 143 mg/dL (ref 0–200)
HDL: 55.4 mg/dL (ref >39.00)
LDL Cholesterol: 62 mg/dL (ref 0–99)
NonHDL: 87.37
Total CHOL/HDL Ratio: 3
Triglycerides: 125 mg/dL (ref 0.0–149.0)
VLDL: 25 mg/dL (ref 0.0–40.0)

## 2019-09-04 LAB — COMPREHENSIVE METABOLIC PANEL
ALT: 26 U/L (ref 0–53)
AST: 21 U/L (ref 0–37)
Albumin: 4.4 g/dL (ref 3.5–5.2)
Alkaline Phosphatase: 42 U/L (ref 39–117)
BUN: 11 mg/dL (ref 6–23)
CO2: 25 mEq/L (ref 19–32)
Calcium: 10 mg/dL (ref 8.4–10.5)
Chloride: 99 mEq/L (ref 96–112)
Creatinine, Ser: 1.04 mg/dL (ref 0.40–1.50)
GFR: 71.4 mL/min (ref >60.00)
Glucose, Bld: 106 mg/dL — ABNORMAL HIGH (ref 70–99)
Potassium: 4.6 mEq/L (ref 3.5–5.1)
Sodium: 135 mEq/L (ref 135–145)
Total Bilirubin: 0.5 mg/dL (ref 0.2–1.2)
Total Protein: 7.6 g/dL (ref 6.0–8.3)

## 2019-09-04 LAB — HEMOGLOBIN A1C: Hgb A1c MFr Bld: 6.9 % — ABNORMAL HIGH (ref 4.6–6.5)

## 2019-09-07 ENCOUNTER — Other Ambulatory Visit: Payer: Self-pay

## 2019-09-07 ENCOUNTER — Encounter: Payer: Self-pay | Admitting: Endocrinology

## 2019-09-07 ENCOUNTER — Ambulatory Visit (INDEPENDENT_AMBULATORY_CARE_PROVIDER_SITE_OTHER): Payer: Medicare Other | Admitting: Endocrinology

## 2019-09-07 VITALS — BP 108/70 | HR 78 | Ht 69.5 in | Wt 270.6 lb

## 2019-09-07 DIAGNOSIS — E1169 Type 2 diabetes mellitus with other specified complication: Secondary | ICD-10-CM

## 2019-09-07 DIAGNOSIS — E669 Obesity, unspecified: Secondary | ICD-10-CM

## 2019-09-07 DIAGNOSIS — E7800 Pure hypercholesterolemia, unspecified: Secondary | ICD-10-CM

## 2019-09-07 DIAGNOSIS — I10 Essential (primary) hypertension: Secondary | ICD-10-CM

## 2019-09-07 NOTE — Progress Notes (Signed)
Patient ID: Evan [name], male   DOB: 09-10-1955, 64 y.o.   MRN: [id number]          Reason for Appointment:Type 2 Diabetes and associated problems   History of Present Illness:          Date of diagnosis of type 2 diabetes mellitus: 2012        Background history:   Diagnosed incidentally with a high blood sugar Has been on metformin since diagnosis and subsequently Amaryl had been added He has had sporadic follow-up for the last few years His A1c had been below 7 to about 08/2015 Subsequently in 9/17 his A1c had gone up to 8%  Recent history:      Non-insulin hypoglycemic drugs : Farxiga 10 mg, Amaryl 41m hs in the pm., metformin 1 g twice a day, Ozempic 1 mg weekly   His A1c has been close to 7 usually and now 6.9   Current management, blood sugar patterns and problems identified:  He has history of fasting hyperglycemia  However he says that he has cut out his carbohydrates completely for the last month  With this his blood sugars are much lower and generally below 100 throughout the day  Has had only one relatively high reading around 7 PM as below but otherwise blood sugars are mostly normal throughout the day  He has not had any hypoglycemia with changing his diet  He thinks he has lost 15 pounds with his diet  Also feels better  Previously his morning sugars would be around 150-160  Recently also started to do walking for exercise  Meals: Usually skipping breakfast, dinner is usually a protein with vegetables or salad, low-fat intake       Side effects from medications have been: None  Glucose monitoring:  done 1 or less times a day         Glucometer: One Touch Verio .      Blood Glucose readings by review of monitor   PRE-MEAL  morning Lunch Dinner Bedtime Overall  Glucose range:      73-179  Mean/median:  95  102  92  90    PREVIOUS readings:   PRE-MEAL Fasting Lunch Dinner BedtiTypeme Overall  Glucose range:  152, 161  108  124, 152  101-159   101-161  Mean/median:     ?   POST-MEAL PC Breakfast PC Lunch PC Dinner  Glucose range:  121  148   Mean/median:        Self-care: The diet that the patient has been following is: tries to limit [employer] Carbohydrates and avoiding drinks with sugar .       Dietician visit, most recent: 02/2017  Weight history:  Wt Readings from Last 3 Encounters:  09/07/19 270 lb 9.6 oz (122.7 kg)  01/26/19 277 lb (125.6 kg)  12/21/18 277 lb 9 oz (125.9 kg)    Glycemic control:   Lab Results  Component Value Date   HGBA1C 6.9 (H) 09/04/2019   HGBA1C 7.0 (H) 05/30/2019   HGBA1C 6.8 (H) 01/23/2019   Lab Results  Component Value Date   MICROALBUR <0.7 09/04/2019   LDLCALC 62 09/04/2019   CREATININE 1.04 09/04/2019   Lab Results  Component Value Date   MICRALBCREAT 4.3 09/04/2019    Lab Results  Component Value Date   FRUCTOSAMINE 239 12/13/2017   FRUCTOSAMINE 268 03/12/2017      Allergies as of 09/07/2019      Reactions   Penicillins  Respiratory distress as infant      Medication List       Accurate as of September 07, 2019  1:49 PM. If you have any questions, ask your nurse or doctor.        aspirin 81 MG tablet Take 81 mg by mouth daily.   desonide 0.05 % cream Commonly known as: DESOWEN APPLY TOPICALLY 2 (TWO) TIMES DAILY. APPLY TO AFFECTED AREA   Farxiga 10 MG Tabs tablet Generic drug: dapagliflozin propanediol Take 10 mg by mouth every morning. Take 1 tablet by mouth every morning.   glimepiride 4 MG tablet Commonly known as: AMARYL Take 1 tablet (4 mg total) by mouth at bedtime.   ibuprofen 200 MG tablet Commonly known as: ADVIL Take 200 mg by mouth at bedtime as needed.   losartan 100 MG tablet Commonly known as: COZAAR TAKE 1 TABLET BY MOUTH EVERY DAY   metFORMIN 1000 MG tablet Commonly known as: GLUCOPHAGE TAKE 1 TABLET BY MOUTH TWICE A DAY WITH MEALS   omeprazole 40 MG capsule Commonly known as: PRILOSEC TAKE 1 CAPSULE BY MOUTH EVERY  DAY AS NEEDED   OneTouch Verio Flex System w/Device Kit 1 kit by Does not apply route daily. Use to check blood sugars 2 times daily   OneTouch Verio test strip Generic drug: glucose blood USE TO CHECK BLOOD SUGARS 2 TIMES DAILY   Ozempic (1 MG/DOSE) 2 MG/1.5ML Sopn Generic drug: Semaglutide (1 MG/DOSE) INJECT '1MG'$  INTO THE SKIN ONCE A WEEK   rosuvastatin 10 MG tablet Commonly known as: CRESTOR TAKE 1 TABLET BY MOUTH EVERY DAY   sildenafil 20 MG tablet Commonly known as: REVATIO TAKE 2-5 TABLETS (40-100 MG TOTAL) BY MOUTH DAILY AS NEEDED (RELATIONS).   Turmeric 500 MG Caps Take by mouth.   Vitamin B12 3000 MCG Subl Place 3,000 mcg under the tongue.   Vitamin D3 25 MCG (1000 UT) Caps Take 1 capsule (1,000 Units total) by mouth daily.       Allergies:  Allergies  Allergen Reactions  . Penicillins     Respiratory distress as infant    Past Medical History:  Diagnosis Date  . Depression    intolerant of cymbalta and wellbutrin  . Diverticulosis of colon   . Esophagitis 03/2015   by EGD  . Gastritis 03/2015   by EGD - mild, chronic  . Hyperlipidemia   . Hypertension   . Lichen simplex chronicus 05/2015   R scrotum [name])  . Murmur    aortic sclerosis, SBE  prophlaxis not needed as per cardio  . NAFLD (nonalcoholic fatty liver disease) 01/2015   by [place]  . 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 T2DM (type 2 diabetes mellitus) ([place])    DSME 09/2013    Past Surgical History:  Procedure Laterality Date  . COLONOSCOPY  2006   diverticulosis per pt  . COLONOSCOPY  03/2015   2 polyps, rpt 5 yrs [name])  . ESOPHAGOGASTRODUODENOSCOPY  03/2015   mild chronic gastritis, esophagitis   . LUMBAR LAMINECTOMY  1989   L5-S1    Family History  Problem Relation Age of Onset  . Alzheimer's disease Father   . Hypertension Father   . Stroke Father   . Hyperlipidemia Father   . Coronary artery disease Brother 3       diabetic, MI  . Prostate  cancer Brother   . Diabetes Brother   . Coronary artery disease Maternal Uncle   . Schizophrenia Paternal  Uncle   . Emphysema Mother     Social History:  reports that he has never smoked. He has never used smokeless tobacco. He reports current alcohol use. He reports that he does not use drugs.   Review of Systems  Lipid history: On Crestor 10 mg and his LDL is consistently below 70 now    Lab Results  Component Value Date   CHOL 143 09/04/2019   HDL 55.40 09/04/2019   LDLCALC 62 09/04/2019   LDLDIRECT 99.0 05/20/2015   TRIG 125.0 09/04/2019   CHOLHDL 3 09/04/2019           Hypertension: On Losartan 100 mg, 1/2 Tablet with good control Blood pressure has been around 937-342 systolic at home  He says that he was feeling a little lightheaded with starting his diet last month but better now  BP Readings from Last 3 Encounters:  09/07/19 108/70  01/26/19 138/78  12/21/18 128/70    FATTY liver: His ALT again is normal   Lab Results  Component Value Date   ALT 26 09/04/2019     Most recent foot exam: 03/2018    LABS:  Lab on 09/04/2019  Component Date Value Ref Range Status  . Microalb, Ur 09/04/2019 <0.7  0.0 - 1.9 mg/dL Final  . Creatinine,U 09/04/2019 16.2  mg/dL Final  . Microalb Creat Ratio 09/04/2019 4.3  0.0 - 30.0 mg/g Final  . Cholesterol 09/04/2019 143  0 - 200 mg/dL Final   ATP III Classification       Desirable:  < 200 mg/dL               Borderline High:  200 - 239 mg/dL          High:  > = 240 mg/dL  . Triglycerides 09/04/2019 125.0  0.0 - 149.0 mg/dL Final   Normal:  <150 mg/dLBorderline High:  150 - 199 mg/dL  . HDL 09/04/2019 55.40  >39.00 mg/dL Final  . VLDL 09/04/2019 25.0  0.0 - 40.0 mg/dL Final  . LDL Cholesterol 09/04/2019 62  0 - 99 mg/dL Final  . Total CHOL/HDL Ratio 09/04/2019 3   Final                  Men          Women1/2 Average Risk     3.4          3.3Average Risk          5.0          4.42X Average Risk          9.6           7.13X Average Risk          15.0          11.0                      . NonHDL 09/04/2019 87.37   Final   NOTE:  Non-HDL goal should be 30 mg/dL higher than patient's LDL goal (i.e. LDL goal of < 70 mg/dL, would have non-HDL goal of < 100 mg/dL)  . Sodium 09/04/2019 135  135 - 145 mEq/L Final  . Potassium 09/04/2019 4.6  3.5 - 5.1 mEq/L Final  . Chloride 09/04/2019 99  96 - 112 mEq/L Final  . CO2 09/04/2019 25  19 - 32 mEq/L Final  . Glucose, Bld 09/04/2019 106* 70 - 99 mg/dL Final  . BUN 09/04/2019 11  6 -  23 mg/dL Final  . Creatinine, Ser 09/04/2019 1.04  0.40 - 1.50 mg/dL Final  . Total Bilirubin 09/04/2019 0.5  0.2 - 1.2 mg/dL Final  . Alkaline Phosphatase 09/04/2019 42  39 - 117 U/L Final  . AST 09/04/2019 21  0 - 37 U/L Final  . ALT 09/04/2019 26  0 - 53 U/L Final  . Total Protein 09/04/2019 7.6  6.0 - 8.3 g/dL Final  . Albumin 09/04/2019 4.4  3.5 - 5.2 g/dL Final  . GFR 09/04/2019 71.40  >60.00 mL/min Final  . Calcium 09/04/2019 10.0  8.4 - 10.5 mg/dL Final  . Hgb A1c MFr Bld 09/04/2019 6.9* 4.6 - 6.5 % Final   Glycemic Control Guidelines for People with Diabetes:Non Diabetic:  <6%Goal of Therapy: <7%Additional Action Suggested:  >8%     Physical Examination:  BP 108/70 (BP Location: Left Arm, Patient Position: Sitting, Cuff Size: Normal)   Pulse 78   Ht 5' 9.5" (1.765 m)   Wt 270 lb 9.6 oz (122.7 kg)   SpO2 96%   BMI 39.39 kg/m   Standing blood pressure 116/74  Diabetic Foot Exam - Simple   Simple Foot Form Diabetic Foot exam was performed with the following findings: Yes   Visual Inspection No deformities, no ulcerations, no other skin breakdown bilaterally: Yes Sensation Testing Intact to touch and monofilament testing bilaterally: Yes Pulse Check Posterior Tibialis and Dorsalis pulse intact bilaterally: Yes Comments     ASSESSMENT:  Diabetes type 2, non-insulin-dependent  See history of present illness for detailed discussion of current diabetes management,  blood sugar patterns and problems identified     A1c is 6.9  He is on 4 drug regimen including Farxiga and Ozempic He has had a drastic improvement in blood sugar readings with a carbohydrate free diet that he is following Also is trying to walk for exercise He has apparently lost 15 pounds at home Most of his blood sugars are in the normal range without hypoglycemia Usually has had higher fasting readings around 150 and now they are below 100 frequently   HYPERTENSION: Well-controlled with losartan 50 mg regimen Home readings in the normal range but appears to have lower reading in the office today without orthostatic change  LIPIDS: Excellent control with LDL 62  Foot exam shows no neuropathy  PLAN:    Continue 4 drug regimen including Amaryl However he should reduce Amaryl down to 2 mg for now to avoid hypoglycemia and hyperinsulinemia If his blood sugars are still averaging 100 or below after couple of weeks he can try to stop Amaryl May reduce Ozempic on the next visit if needed Regular exercise To call if he has any difficulties with his blood sugar control  We will need to monitor his blood pressure regularly and if starting to get low consider 25 mg losartan He will likely needs to compare his home readings with the office blood pressure meter  Continue Crestor 10 mg   There are no Patient Instructions on file for this visit.  Elayne Snare 09/07/2019, 1:49 PM   Note: This office note was prepared with Dragon voice recognition system technology. Any transcriptional errors that result from this process are unintentional.

## 2019-09-25 ENCOUNTER — Encounter: Payer: Self-pay | Admitting: Family Medicine

## 2019-09-26 NOTE — Telephone Encounter (Signed)
Spoke with pt scheduling OV tomorrow at 11:15.

## 2019-09-27 ENCOUNTER — Encounter: Payer: Self-pay | Admitting: Family Medicine

## 2019-09-27 ENCOUNTER — Ambulatory Visit (INDEPENDENT_AMBULATORY_CARE_PROVIDER_SITE_OTHER): Payer: Medicare Other | Admitting: Family Medicine

## 2019-09-27 ENCOUNTER — Other Ambulatory Visit: Payer: Self-pay

## 2019-09-27 DIAGNOSIS — S0911XA Strain of muscle and tendon of head, initial encounter: Secondary | ICD-10-CM | POA: Diagnosis not present

## 2019-09-27 NOTE — Assessment & Plan Note (Signed)
Anticipate R jaw strain. Overall benign exam. He does regularly chew gum. Advised back off this. Supportive care reviewed - warm compress/heating pad, voltaren gel, ok to continue NSAID PRN for another 2-3 days. Update if not improving with this.

## 2019-09-27 NOTE — Patient Instructions (Signed)
I think you have strain of TMJ on right from over chewing Back off gum  Use warm compress to that side of face, may also use voltaren gel to TMJ.  Look for sour candy to stimulate saliva as R salivary duct was a bit inflamed.  Ok to continue ibuprofen for next 2-3 days.  Let us know if not improving with this.

## 2019-09-27 NOTE — Progress Notes (Signed)
This visit was conducted in person.  BP 130/78 (BP Location: Left Arm, Patient Position: Sitting, Cuff Size: Large)   Pulse 76   Temp (!) 97.5 F (36.4 C) (Temporal)   Ht 5' 9.5" (1.765 m)   Wt 262 lb 9 oz (119.1 kg)   SpO2 97%   BMI 38.22 kg/m    CC: R jaw pain Subjective:    Patient ID: Evan [name], male    DOB: 01/24/1956, 64 y.o.   MRN: [id number]  HPI: Evan [name] is a 64 y.o. male presenting on 09/27/2019 for Jaw Pain (C/o right side jaw pain, worsening.  Causing trouble chewing/swallowing.  Started 09/25/19.  )   2 day h/o R jaw pain, worse with chewing or opening mouth wide. Trouble swallowing when he awakens in the morning. Treating with ibuprofen 478m 2-3 times a day. Denies inciting trauma/injury or falls. Points posterior to TMJ. Feels jaw is swollen. No ongoing redness or warmth.   No ear or tooth pain. No sinus pain, headaches. No temporal pain. No fevers/chills, chest pain, dyspnea, abd pain, nausea.   Weight loss noted - has started keto diet for the past 6 wks. 15 lbs since last year.      Relevant past medical, surgical, family and social history reviewed and updated as indicated. Interim medical history since our last visit reviewed. Allergies and medications reviewed and updated. Outpatient Medications Prior to Visit  Medication Sig Dispense Refill  . aspirin 81 MG tablet Take 81 mg by mouth daily.      . Blood Glucose Monitoring Suppl (ONETOUCH VERIO FLEX SYSTEM) w/Device KIT 1 kit by Does not apply route daily. Use to check blood sugars 2 times daily 1 kit 2  . Cholecalciferol (VITAMIN D3) 1000 units CAPS Take 1 capsule (1,000 Units total) by mouth daily. 30 capsule   . Cyanocobalamin (VITAMIN B12) 3000 MCG SUBL Place 3,000 mcg under the tongue.    . dapagliflozin propanediol (FARXIGA) 10 MG TABS tablet Take 10 mg by mouth every morning. Take 1 tablet by mouth every morning. 30 tablet 2  . desonide (DESOWEN) 0.05 % cream APPLY TOPICALLY 2 (TWO) TIMES  DAILY. APPLY TO AFFECTED AREA 30 g 0  . glimepiride (AMARYL) 4 MG tablet Take 1 tablet (4 mg total) by mouth at bedtime. 90 tablet 0  . ibuprofen (ADVIL,MOTRIN) 200 MG tablet Take 200 mg by mouth at bedtime as needed.    .Marland Kitchenlosartan (COZAAR) 100 MG tablet TAKE 1 TABLET BY MOUTH EVERY DAY 90 tablet 3  . metFORMIN (GLUCOPHAGE) 1000 MG tablet TAKE 1 TABLET BY MOUTH TWICE A DAY WITH MEALS 180 tablet 1  . omeprazole (PRILOSEC) 40 MG capsule TAKE 1 CAPSULE BY MOUTH EVERY DAY AS NEEDED 90 capsule 1  . ONETOUCH VERIO test strip USE TO CHECK BLOOD SUGARS 2 TIMES DAILY 200 strip 3  . OZEMPIC, 1 MG/DOSE, 2 MG/1.5ML SOPN INJECT 1MG INTO THE SKIN ONCE A WEEK 4 pen 3  . rosuvastatin (CRESTOR) 10 MG tablet TAKE 1 TABLET BY MOUTH EVERY DAY 90 tablet 3  . sildenafil (REVATIO) 20 MG tablet TAKE 2-5 TABLETS (40-100 MG TOTAL) BY MOUTH DAILY AS NEEDED (RELATIONS). 30 tablet 3  . Turmeric 500 MG CAPS Take by mouth.     No facility-administered medications prior to visit.     Per HPI unless specifically indicated in ROS section below Review of Systems Objective:    BP 130/78 (BP Location: Left Arm, Patient Position: Sitting, Cuff Size: Large)  Pulse 76   Temp (!) 97.5 F (36.4 C) (Temporal)   Ht 5' 9.5" (1.765 m)   Wt 262 lb 9 oz (119.1 kg)   SpO2 97%   BMI 38.22 kg/m   Wt Readings from Last 3 Encounters:  09/27/19 262 lb 9 oz (119.1 kg)  09/07/19 270 lb 9.6 oz (122.7 kg)  01/26/19 277 lb (125.6 kg)    Physical Exam Vitals and nursing note reviewed.  Constitutional:      Appearance: Normal appearance. He is not ill-appearing.  HENT:     Head: Normocephalic and atraumatic.      Comments:  Area of discomfort as per above picture No swelling or mass appreciated to salivary glands or parotid gland. No LAD appreciated.  Tender just below TMJ on right with reproducible discomfort with opening and closing mouth.     Right Ear: Tympanic membrane, ear canal and external ear normal. There is no impacted  cerumen.     Left Ear: Tympanic membrane, ear canal and external ear normal. There is no impacted cerumen.     Nose: Nose normal.     Mouth/Throat:     Mouth: Mucous membranes are moist.     Dentition: Normal dentition. No dental tenderness.     Tongue: No lesions.     Palate: No mass.     Pharynx: Oropharynx is clear. Uvula midline. No oropharyngeal exudate or posterior oropharyngeal erythema.     Tonsils: No tonsillar exudate.     Comments: Slight enlargement of stensen's duct on right without other oral lesions noted Eyes:     Extraocular Movements: Extraocular movements intact.     Pupils: Pupils are equal, round, and reactive to light.  Neurological:     Mental Status: He is alert.  Psychiatric:        Mood and Affect: Mood normal.        Behavior: Behavior normal.       Lab Results  Component Value Date   HGBA1C 6.9 (H) 09/04/2019    Assessment & Plan:  This visit occurred during the SARS-CoV-2 public health emergency.  Safety protocols were in place, including screening questions prior to the visit, additional usage of staff PPE, and extensive cleaning of exam room while observing appropriate contact time as indicated for disinfecting solutions.   Problem List Items Addressed This Visit    Strain of jaw    Anticipate R jaw strain. Overall benign exam. He does regularly chew gum. Advised back off this. Supportive care reviewed - warm compress/heating pad, voltaren gel, ok to continue NSAID PRN for another 2-3 days. Update if not improving with this.           No orders of the defined types were placed in this encounter.  No orders of the defined types were placed in this encounter.   Patient Instructions  I think you have strain of TMJ on right from over chewing Back off gum  Use warm compress to that side of face, may also use voltaren gel to TMJ.  Look for sour candy to stimulate saliva as R salivary duct was a bit inflamed.  Ok to continue ibuprofen for next  2-3 days.  Let us know if not improving with this.    Follow up plan: Return if symptoms worsen or fail to improve.  Ria Bush, MD

## 2019-11-06 ENCOUNTER — Other Ambulatory Visit: Payer: Self-pay | Admitting: Endocrinology

## 2019-11-06 MED ORDER — METFORMIN HCL 1000 MG PO TABS: 1000.0000 mg | ORAL_TABLET | Freq: Two times a day (BID) | ORAL | 1 refills | Status: DC

## 2019-12-05 ENCOUNTER — Other Ambulatory Visit (INDEPENDENT_AMBULATORY_CARE_PROVIDER_SITE_OTHER): Payer: Medicare Other

## 2019-12-05 ENCOUNTER — Other Ambulatory Visit: Payer: Self-pay

## 2019-12-05 DIAGNOSIS — E669 Obesity, unspecified: Secondary | ICD-10-CM

## 2019-12-05 DIAGNOSIS — E1169 Type 2 diabetes mellitus with other specified complication: Secondary | ICD-10-CM

## 2019-12-05 LAB — BASIC METABOLIC PANEL
BUN: 11 mg/dL (ref 6–23)
CO2: 29 mEq/L (ref 19–32)
Calcium: 10 mg/dL (ref 8.4–10.5)
Chloride: 100 mEq/L (ref 96–112)
Creatinine, Ser: 0.95 mg/dL (ref 0.40–1.50)
GFR: 79.21 mL/min (ref >60.00)
Glucose, Bld: 105 mg/dL — ABNORMAL HIGH (ref 70–99)
Potassium: 4.9 mEq/L (ref 3.5–5.1)
Sodium: 137 mEq/L (ref 135–145)

## 2019-12-05 LAB — HEMOGLOBIN A1C: Hgb A1c MFr Bld: 5.5 % (ref 4.6–6.5)

## 2019-12-08 ENCOUNTER — Ambulatory Visit: Payer: Medicare Other | Admitting: Endocrinology

## 2019-12-11 ENCOUNTER — Ambulatory Visit: Payer: Medicare Other | Admitting: Endocrinology

## 2019-12-12 ENCOUNTER — Ambulatory Visit (INDEPENDENT_AMBULATORY_CARE_PROVIDER_SITE_OTHER): Payer: Medicare Other | Admitting: Endocrinology

## 2019-12-12 ENCOUNTER — Other Ambulatory Visit: Payer: Self-pay | Admitting: Family Medicine

## 2019-12-12 ENCOUNTER — Encounter: Payer: Self-pay | Admitting: Endocrinology

## 2019-12-12 ENCOUNTER — Other Ambulatory Visit: Payer: Self-pay

## 2019-12-12 VITALS — BP 120/74 | HR 70 | Ht 69.0 in | Wt 238.6 lb

## 2019-12-12 DIAGNOSIS — E1169 Type 2 diabetes mellitus with other specified complication: Secondary | ICD-10-CM | POA: Diagnosis not present

## 2019-12-12 DIAGNOSIS — I10 Essential (primary) hypertension: Secondary | ICD-10-CM | POA: Diagnosis not present

## 2019-12-12 DIAGNOSIS — E7800 Pure hypercholesterolemia, unspecified: Secondary | ICD-10-CM

## 2019-12-12 DIAGNOSIS — E669 Obesity, unspecified: Secondary | ICD-10-CM

## 2019-12-12 NOTE — Patient Instructions (Signed)
Glimeperide 1/2 tab daily

## 2019-12-12 NOTE — Progress Notes (Signed)
Patient ID: Evan [name], male   DOB: 1956/06/02, 64 y.o.   MRN: [id number]          Reason for Appointment:Type 2 Diabetes and associated problems   History of Present Illness:          Date of diagnosis of type 2 diabetes mellitus: 2012        Background history:   Diagnosed incidentally with a high blood sugar Has been on metformin since diagnosis and subsequently Amaryl had been added He has had sporadic follow-up for the last few years His A1c had been below 7 to about 08/2015 Subsequently in 9/17 his A1c had gone up to 8%  Recent history:      Non-insulin hypoglycemic drugs : Farxiga 10 mg, Amaryl 55m hs in the pm., metformin 1 g twice a day, Ozempic 1 mg weekly   His A1c has been close to 7 usually and now 5.5 compared to 6.9   Current management, blood sugar patterns and problems identified:  He has not reduce his glimepiride to 2 mg as recommended in March  Even though initially with a low carbohydrate diet he is morning sugars were averaging below 100 they are now slightly higher  However he has had 4 or 5 episodes where he will feel weak and shaky with blood sugars in the 70s including once or twice a bedtime and in the morning  He has lost a significant amount of weight again  Although he still is restricting his carbohydrates significantly and portions also he feels more hungry now  This is despite taking his Ozempic regularly  He has continued increasing his exercise and he thinks he is getting 12,000 steps, 5 days a week with walking  Postprandial readings at night are excellent and highest readings are still in the morning overall  Meals: Usually skipping breakfast, dinner is usually a protein with vegetables or salad, low-fat intake       Side effects from medications have been: None  Glucose monitoring:  done 1 or less times a day         Glucometer: One Touch Verio .      Blood Glucose readings by download of monitor   PRE-MEAL Fasting Lunch  Dinner Bedtime Overall  Glucose range:  74-133   85-103  77-117   Mean/median:  107  104   97  102   Previous readings:  PRE-MEAL  morning Lunch Dinner Bedtime Overall  Glucose range:      73-179  Mean/median:  95  102  92  90     Self-care: The diet that the patient has been following is: tries to reduce high-fat and fried foods, Carbohydrates and avoiding drinks with sugar .       Dietician visit, most recent: 02/2017  Weight history:  Wt Readings from Last 3 Encounters:  12/12/19 238 lb 9.6 oz (108.2 kg)  09/27/19 262 lb 9 oz (119.1 kg)  09/07/19 270 lb 9.6 oz (122.7 kg)    Glycemic control:   Lab Results  Component Value Date   HGBA1C 5.5 12/05/2019   HGBA1C 6.9 (H) 09/04/2019   HGBA1C 7.0 (H) 05/30/2019   Lab Results  Component Value Date   MICROALBUR <0.7 09/04/2019   LDLCALC 62 09/04/2019   CREATININE 0.95 12/05/2019   Lab Results  Component Value Date   MICRALBCREAT 4.3 09/04/2019    Lab Results  Component Value Date   FRUCTOSAMINE 239 12/13/2017   FRUCTOSAMINE 268 03/12/2017  Allergies as of 12/12/2019      Reactions   Penicillins    Respiratory distress as infant      Medication List       Accurate as of December 12, 2019  9:27 AM. If you have any questions, ask your nurse or doctor.        aspirin 81 MG tablet Take 81 mg by mouth daily.   desonide 0.05 % cream Commonly known as: DESOWEN APPLY TOPICALLY 2 (TWO) TIMES DAILY. APPLY TO AFFECTED AREA   Farxiga 10 MG Tabs tablet Generic drug: dapagliflozin propanediol Take 10 mg by mouth every morning. Take 1 tablet by mouth every morning.   glimepiride 4 MG tablet Commonly known as: AMARYL Take 1 tablet (4 mg total) by mouth at bedtime.   ibuprofen 200 MG tablet Commonly known as: ADVIL Take 200 mg by mouth at bedtime as needed.   losartan 100 MG tablet Commonly known as: COZAAR TAKE 1 TABLET BY MOUTH EVERY DAY   metFORMIN 1000 MG tablet Commonly known as: GLUCOPHAGE Take 1  tablet (1,000 mg total) by mouth 2 (two) times daily with a meal.   omeprazole 40 MG capsule Commonly known as: PRILOSEC TAKE 1 CAPSULE BY MOUTH EVERY DAY AS NEEDED   OneTouch Verio Flex System w/Device Kit 1 kit by Does not apply route daily. Use to check blood sugars 2 times daily   OneTouch Verio test strip Generic drug: glucose blood USE TO CHECK BLOOD SUGARS 2 TIMES DAILY   Ozempic (1 MG/DOSE) 2 MG/1.5ML Sopn Generic drug: Semaglutide (1 MG/DOSE) INJECT 1MG INTO THE SKIN ONCE A WEEK   rosuvastatin 10 MG tablet Commonly known as: CRESTOR TAKE 1 TABLET BY MOUTH EVERY DAY   sildenafil 20 MG tablet Commonly known as: REVATIO TAKE 2-5 TABLETS (40-100 MG TOTAL) BY MOUTH DAILY AS NEEDED (RELATIONS).   Turmeric 500 MG Caps Take by mouth.   Vitamin B12 3000 MCG Subl Place 3,000 mcg under the tongue.   Vitamin D3 25 MCG (1000 UT) Caps Take 1 capsule (1,000 Units total) by mouth daily.       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[place])   T2DM (type 2 diabetes mellitus) ([place])    DSME 09/2013    Past Surgical History:  Procedure Laterality Date   COLONOSCOPY  2006   diverticulosis per pt   COLONOSCOPY  03/2015   2 polyps, rpt 5 yrs [name])   ESOPHAGOGASTRODUODENOSCOPY  03/2015   mild chronic gastritis, esophagitis    LUMBAR LAMINECTOMY  1989   L5-S1    Family History  Problem Relation Age of Onset   Alzheimer's disease  Father    Hypertension Father    Stroke Father    Hyperlipidemia Father    Coronary artery disease Brother 61       diabetic, MI  Prostate cancer Brother    Diabetes Brother    Coronary artery disease Maternal Uncle    Schizophrenia Paternal Uncle    Emphysema Mother     Social History:  reports that he has never smoked. He has never used smokeless tobacco. He reports current alcohol use. He reports that he does not use drugs.   Review of Systems  Lipid history: On Crestor 10 mg and his LDL is consistently below 70 now    Lab Results  Component Value Date   CHOL 143 09/04/2019   HDL 55.40 09/04/2019   LDLCALC 62 09/04/2019   LDLDIRECT 99.0 05/20/2015   TRIG 125.0 09/04/2019   CHOLHDL 3 09/04/2019           Hypertension: On Losartan 100 mg, 1/2 Tablet with good control Blood pressure has been around 885-027 systolic at home  BP Readings from Last 3 Encounters:  12/12/19 120/74  09/27/19 130/78  09/07/19 108/70    FATTY liver: His ALT again is normal   Lab Results  Component Value Date   ALT 26 09/04/2019     Most recent foot exam: 03/2018    LABS:  No visits with results within 1 Week(s) from this visit.  Latest known visit with results is:  Lab on 12/05/2019  Component Date Value Ref Range Status   Sodium 12/05/2019 137  135 - 145 mEq/L Final   Potassium 12/05/2019 4.9  3.5 - 5.1 mEq/L Final   Chloride 12/05/2019 100  96 - 112 mEq/L Final   CO2 12/05/2019 29  19 - 32 mEq/L Final   Glucose, Bld 12/05/2019 105* 70 - 99 mg/dL Final   BUN 12/05/2019 11  6 - 23 mg/dL Final   Creatinine, Ser 12/05/2019 0.95  0.40 - 1.50 mg/dL Final   GFR 12/05/2019 79.21  >60.00 mL/min Final   Calcium 12/05/2019 10.0  8.4 - 10.5 mg/dL Final   Hgb A1c MFr Bld 12/05/2019 5.5  4.6 - 6.5 % Final   Glycemic Control Guidelines for People with Diabetes:Non Diabetic:  <6%Goal of Therapy: <7%Additional Action Suggested:  >8%     Physical Examination:  BP  120/74 (BP Location: Left Arm, Patient Position: Sitting)    Pulse 70    Ht '5\' 9"'  (1.753 m)    Wt 238 lb 9.6 oz (108.2 kg)    SpO2 98%    BMI 35.24 kg/m   No pedal edema   ASSESSMENT:  Diabetes type 2, non-insulin-dependent  See history of present illness for detailed discussion of current diabetes management, blood sugar patterns and problems identified     A1c is 5.5 compared to 6.9  He is on 4 drug regimen including Farxiga and Ozempic His A1c is lower than expected since his fasting readings are still relatively higher compared to his last visit However he has lost a significant amount of weight with his regular exercising along with his low carbohydrate diet   HYPERTENSION: Well-controlled with losartan 50 mg regimen He does monitor at home also No orthostatic symptoms currently    PLAN:    Again asked him to reduce Amaryl down to 2 mg for now to avoid hypoglycemia tendency Not clear if this is indirectly increasing his appetite If his blood sugars in the morning are still mostly 100 or below later on may consider reducing to 1 mg Although he is wanting to cut back on his medications this will depend on how his blood sugars are along with maintenance of weight loss.  Most  likely would like to stop Amaryl before any of the other medications which have cardiovascular benefits  Continue regular exercise Follow-up in 4 months  He will follow-up with his PCP regarding his blood pressure medication  Lipids on the next visit to be checked   There are no Patient Instructions on file for this visit.  Elayne Snare 12/12/2019, 9:27 AM   Note: This office note was prepared with Dragon voice recognition system technology. Any transcriptional errors that result from this process are unintentional.

## 2019-12-19 ENCOUNTER — Other Ambulatory Visit: Payer: Self-pay | Admitting: Family Medicine

## 2019-12-19 ENCOUNTER — Other Ambulatory Visit (INDEPENDENT_AMBULATORY_CARE_PROVIDER_SITE_OTHER): Payer: Medicare Other

## 2019-12-19 ENCOUNTER — Other Ambulatory Visit: Payer: Self-pay

## 2019-12-19 DIAGNOSIS — Z125 Encounter for screening for malignant neoplasm of prostate: Secondary | ICD-10-CM | POA: Diagnosis not present

## 2019-12-19 DIAGNOSIS — E118 Type 2 diabetes mellitus with unspecified complications: Secondary | ICD-10-CM

## 2019-12-19 DIAGNOSIS — E7800 Pure hypercholesterolemia, unspecified: Secondary | ICD-10-CM | POA: Diagnosis not present

## 2019-12-19 LAB — COMPREHENSIVE METABOLIC PANEL
ALT: 16 U/L (ref 0–53)
AST: 16 U/L (ref 0–37)
Albumin: 4.4 g/dL (ref 3.5–5.2)
Alkaline Phosphatase: 50 U/L (ref 39–117)
BUN: 14 mg/dL (ref 6–23)
CO2: 30 mEq/L (ref 19–32)
Calcium: 10 mg/dL (ref 8.4–10.5)
Chloride: 99 mEq/L (ref 96–112)
Creatinine, Ser: 1.01 mg/dL (ref 0.40–1.50)
GFR: 73.79 mL/min (ref >60.00)
Glucose, Bld: 98 mg/dL (ref 70–99)
Potassium: 4.9 mEq/L (ref 3.5–5.1)
Sodium: 139 mEq/L (ref 135–145)
Total Bilirubin: 0.5 mg/dL (ref 0.2–1.2)
Total Protein: 6.6 g/dL (ref 6.0–8.3)

## 2019-12-19 LAB — LIPID PANEL
Cholesterol: 141 mg/dL (ref 0–200)
HDL: 65.6 mg/dL (ref >39.00)
LDL Cholesterol: 46 mg/dL (ref 0–99)
NonHDL: 75
Total CHOL/HDL Ratio: 2
Triglycerides: 144 mg/dL (ref 0.0–149.0)
VLDL: 28.8 mg/dL (ref 0.0–40.0)

## 2019-12-19 LAB — PSA, MEDICARE: PSA: 1.15 ng/ml (ref 0.10–4.00)

## 2019-12-22 LAB — FRUCTOSAMINE: Fructosamine: 224 umol/L (ref 205–285)

## 2019-12-25 ENCOUNTER — Encounter: Payer: Self-pay | Admitting: Family Medicine

## 2019-12-25 ENCOUNTER — Ambulatory Visit (INDEPENDENT_AMBULATORY_CARE_PROVIDER_SITE_OTHER): Payer: Medicare Other | Admitting: Family Medicine

## 2019-12-25 ENCOUNTER — Other Ambulatory Visit: Payer: Self-pay

## 2019-12-25 ENCOUNTER — Other Ambulatory Visit: Payer: Self-pay | Admitting: Endocrinology

## 2019-12-25 VITALS — BP 124/68 | HR 78 | Temp 97.9°F | Ht 70.5 in | Wt 234.2 lb

## 2019-12-25 DIAGNOSIS — K760 Fatty (change of) liver, not elsewhere classified: Secondary | ICD-10-CM | POA: Diagnosis not present

## 2019-12-25 DIAGNOSIS — F3342 Major depressive disorder, recurrent, in full remission: Secondary | ICD-10-CM | POA: Diagnosis not present

## 2019-12-25 DIAGNOSIS — R011 Cardiac murmur, unspecified: Secondary | ICD-10-CM | POA: Diagnosis not present

## 2019-12-25 DIAGNOSIS — Z23 Encounter for immunization: Secondary | ICD-10-CM

## 2019-12-25 DIAGNOSIS — R1011 Right upper quadrant pain: Secondary | ICD-10-CM

## 2019-12-25 DIAGNOSIS — Z0000 Encounter for general adult medical examination without abnormal findings: Secondary | ICD-10-CM | POA: Diagnosis not present

## 2019-12-25 DIAGNOSIS — I10 Essential (primary) hypertension: Secondary | ICD-10-CM

## 2019-12-25 DIAGNOSIS — E118 Type 2 diabetes mellitus with unspecified complications: Secondary | ICD-10-CM

## 2019-12-25 DIAGNOSIS — E669 Obesity, unspecified: Secondary | ICD-10-CM | POA: Diagnosis not present

## 2019-12-25 DIAGNOSIS — E1169 Type 2 diabetes mellitus with other specified complication: Secondary | ICD-10-CM | POA: Diagnosis not present

## 2019-12-25 DIAGNOSIS — I700 Atherosclerosis of aorta: Secondary | ICD-10-CM

## 2019-12-25 DIAGNOSIS — Z7189 Other specified counseling: Secondary | ICD-10-CM | POA: Diagnosis not present

## 2019-12-25 DIAGNOSIS — N529 Male erectile dysfunction, unspecified: Secondary | ICD-10-CM

## 2019-12-25 DIAGNOSIS — K2100 Gastro-esophageal reflux disease with esophagitis, without bleeding: Secondary | ICD-10-CM

## 2019-12-25 DIAGNOSIS — E785 Hyperlipidemia, unspecified: Secondary | ICD-10-CM

## 2019-12-25 MED ORDER — ROSUVASTATIN CALCIUM 5 MG PO TABS: 5.0000 mg | ORAL_TABLET | Freq: Every day | ORAL | 3 refills | Status: DC

## 2019-12-25 MED ORDER — SILDENAFIL CITRATE 20 MG PO TABS: 40.0000 mg | ORAL_TABLET | Freq: Every day | ORAL | 3 refills | Status: DC | PRN

## 2019-12-25 MED ORDER — OMEPRAZOLE 20 MG PO CPDR: 20.0000 mg | DELAYED_RELEASE_CAPSULE | Freq: Every day | ORAL | Status: DC | PRN

## 2019-12-25 MED ORDER — GLIMEPIRIDE 2 MG PO TABS: 2.0000 mg | ORAL_TABLET | Freq: Every day | ORAL | 3 refills | Status: DC

## 2019-12-25 MED ORDER — LOSARTAN POTASSIUM 100 MG PO TABS: 100.0000 mg | ORAL_TABLET | Freq: Every day | ORAL | 3 refills | Status: DC

## 2019-12-25 NOTE — Assessment & Plan Note (Signed)
Wonderful control. Followed by endo. On lower amaryl dose.

## 2019-12-25 NOTE — Assessment & Plan Note (Signed)
Ongoing for at least 5 years, s/p reassuring GI evaluation. Known h/o hepatic steatosis with hepatomegaly on CT scan 2018. Will update abd [place]. Would anticipate improvement in fatty liver with recent weight loss

## 2019-12-25 NOTE — Progress Notes (Signed)
This visit was conducted in person.  BP 124/68 (BP Location: Left Arm, Patient Position: Sitting, Cuff Size: Large)   Pulse 78   Temp 97.9 F (36.6 C) (Temporal)   Ht 5' 10.5" (1.791 m)   Wt 234 lb 4 oz (106.3 kg)   SpO2 96%   BMI 33.14 kg/m    CC: AMW  Subjective:    Patient ID: Evan [name], male    DOB: [birth year], 64 y.o.   MRN: [id number]  HPI: Evan [name] is a 64 y.o. male presenting on 12/25/2019 for Medicare Wellness   Did not see health advisor this year.   Hearing Screening   _0  _1  _2  _3  _4  _5  _6  _7  _8   Right ear:   25 40 20  0    Left ear:   25 40 20  0    Vision Screening Comments: Last eye exam, 03/2019.     Office Visit from 12/25/2019 in [facility] [place]  PHQ-2 Total Score 0      Fall Risk  12/25/2019 12/21/2018 02/18/2017 10/10/2013  Falls in the past year? 0 0 No No      DM - followed by Dr Dwyane Dee. A1c great control recently  Successful weight loss through intermittent fasting, ketogenic diet and calorie counting.  S/p PT for knee arthritis. L knee brace has also helped. Knees doing great!  Ongoing constant RUQ discomfort present for 5 years s/p abd [place], CT scan and gallbladder emptying study 2017. Also saw GI with EGD showing mild chronic gastritis/esophagitis. Significant weight loss since then.   Preventative: COLONOSCOPY Date: 03/2015 2 polyps, rpt 5 yrs [name]) ESOPHAGOGASTRODUODENOSCOPY Date: 03/2015 mild chronic gastritis, esophagitis- on daily PPI Prostate cancer screening - Brother with prostate cancer age 39. Nocturia x1+ Flu shotyearly Tdap 04/2012  Pneumovax 12/2012. Prevnar 12/2018  COVID vaccine - completed Pfizer vaccine 07/2019 Zostavax - declines. Has had recurrent shingles in the past Shingrix - discussed. Advanced planning discussion - has this at home, currently updating. HCPOA would be wife then oldest child. Asked to bring [place] copy.  Seat belt use discussed. Sunscreen use  discussed.No changing moles on skin. 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 Keeps record of caloric intake     Relevant past medical, surgical, family and social history reviewed and updated as indicated. Interim medical history since our last visit reviewed. Allergies and medications reviewed and updated. Outpatient Medications Prior to Visit  Medication Sig Dispense Refill  . aspirin 81 MG tablet Take 81 mg by mouth daily.      . Blood Glucose Monitoring Suppl (ONETOUCH VERIO FLEX SYSTEM) w/Device KIT 1 kit by Does not apply route daily. Use to check blood sugars 2 times daily 1 kit 2  . Cholecalciferol (VITAMIN D3) 1000 units CAPS Take 1 capsule (1,000 Units total) by mouth daily. 30 capsule   . Cyanocobalamin (VITAMIN B12) 3000 MCG SUBL Place 3,000 mcg under the tongue.    . dapagliflozin propanediol (FARXIGA) 10 MG TABS tablet Take 10 mg by mouth every morning. Take 1 tablet by mouth every morning. 30 tablet 2  . ibuprofen (ADVIL,MOTRIN) 200 MG tablet Take 200 mg by  mouth at bedtime as needed.    . metFORMIN (GLUCOPHAGE) 1000 MG tablet Take 1 tablet (1,000 mg total) by mouth 2 (two) times daily with a meal. 180 tablet 1  . ONETOUCH VERIO test strip USE TO CHECK BLOOD SUGARS 2 TIMES DAILY 200 strip 3  . OZEMPIC, 1 MG/DOSE, 2 MG/1.5ML SOPN INJECT 1MG INTO THE SKIN ONCE A WEEK 4 pen 3  . Turmeric 500 MG CAPS Take by mouth.    Marland Kitchen glimepiride (AMARYL) 4 MG tablet TAKE 1 TABLET (4 MG TOTAL) BY MOUTH AT BEDTIME. 90 tablet 3  . losartan (COZAAR) 100 MG tablet TAKE 1 TABLET BY MOUTH EVERY DAY 90 tablet 3  . omeprazole (PRILOSEC) 40 MG capsule TAKE 1 CAPSULE BY MOUTH EVERY DAY AS  NEEDED 90 capsule 1  . rosuvastatin (CRESTOR) 10 MG tablet TAKE 1 TABLET BY MOUTH EVERY DAY 90 tablet 3  . sildenafil (REVATIO) 20 MG tablet TAKE 2-5 TABLETS (40-100 MG TOTAL) BY MOUTH DAILY AS NEEDED (RELATIONS). 30 tablet 3  . desonide (DESOWEN) 0.05 % cream APPLY TOPICALLY 2 (TWO) TIMES DAILY. APPLY TO AFFECTED AREA (Patient not taking: Reported on 12/12/2019) 30 g 0   No facility-administered medications prior to visit.     Per HPI unless specifically indicated in ROS section below Review of Systems Objective:  BP 124/68 (BP Location: Left Arm, Patient Position: Sitting, Cuff Size: Large)   Pulse 78   Temp 97.9 F (36.6 C) (Temporal)   Ht 5' 10.5" (1.791 m)   Wt 234 lb 4 oz (106.3 kg)   SpO2 96%   BMI 33.14 kg/m   Wt Readings from Last 3 Encounters:  12/25/19 234 lb 4 oz (106.3 kg)  12/12/19 238 lb 9.6 oz (108.2 kg)  09/27/19 262 lb 9 oz (119.1 kg)      Physical Exam Vitals and nursing note reviewed.  Constitutional:      General: He is not in acute distress.    Appearance: Normal appearance. He is well-developed. He is not ill-appearing.  HENT:     Head: Normocephalic and atraumatic.     Right Ear: Hearing, tympanic membrane, ear canal and external ear normal.     Left Ear: Hearing, tympanic membrane, ear canal and external ear normal.  Eyes:     General: No scleral icterus.    Extraocular Movements: Extraocular movements intact.     Conjunctiva/sclera: Conjunctivae normal.     Pupils: Pupils are equal, round, and reactive to light.  Neck:     Thyroid: No thyroid mass, thyromegaly or thyroid tenderness.     Vascular: Carotid bruit (radiation of murmur) present.  Cardiovascular:     Rate and Rhythm: Normal rate and regular rhythm.     Pulses: Normal pulses.          Radial pulses are 2+ on the right side and 2+ on the left side.     Heart sounds: Murmur (4/6 systolic best at USB, radiation to carotids) heard.   Pulmonary:     Effort: Pulmonary effort is normal. No  respiratory distress.     Breath sounds: Normal breath sounds. No wheezing, rhonchi or rales.  Abdominal:     General: Abdomen is flat. Bowel sounds are normal. There is no distension.     Palpations: Abdomen is soft. There is no mass.     Tenderness: There is no abdominal tenderness. There is no guarding or rebound.     Hernia: No hernia is present.  Genitourinary:  Prostate: Normal. Not enlarged (20gm), not tender and no nodules present.     Rectum: Normal. No mass, tenderness, anal fissure, external hemorrhoid or internal hemorrhoid. Normal anal tone.  Musculoskeletal:        General: Normal range of motion.     Cervical back: Normal range of motion and neck supple.     Right lower leg: No edema.     Left lower leg: No edema.  Lymphadenopathy:     Cervical: No cervical adenopathy.  Skin:    General: Skin is warm and dry.     Findings: No rash.  Neurological:     General: No focal deficit present.     Mental Status: He is alert and oriented to person, place, and time.     Comments:  CN grossly intact, station and gait intact Recall 3/3 Calculation 5/5 DLROW  Psychiatric:        Mood and Affect: Mood normal.        Behavior: Behavior normal.        Thought Content: Thought content normal.        Judgment: Judgment normal.       Results for orders placed or performed in visit on 12/19/19  Fructosamine  Result Value Ref Range   Fructosamine 224 205 - 285 umol/L  PSA, Medicare  Result Value Ref Range   PSA 1.15 0.10 - 4.00 ng/ml  Comprehensive metabolic panel  Result Value Ref Range   Sodium 139 135 - 145 mEq/L   Potassium 4.9 3.5 - 5.1 mEq/L   Chloride 99 96 - 112 mEq/L   CO2 30 19 - 32 mEq/L   Glucose, Bld 98 70 - 99 mg/dL   BUN 14 6 - 23 mg/dL   Creatinine, Ser 1.01 0.40 - 1.50 mg/dL   Total Bilirubin 0.5 0.2 - 1.2 mg/dL   Alkaline Phosphatase 50 39 - 117 U/L   AST 16 0 - 37 U/L   ALT 16 0 - 53 U/L   Total Protein 6.6 6.0 - 8.3 g/dL   Albumin 4.4 3.5 - 5.2  g/dL   GFR 73.79 >60.00 mL/min   Calcium 10.0 8.4 - 10.5 mg/dL  Lipid panel  Result Value Ref Range   Cholesterol 141 0 - 200 mg/dL   Triglycerides 144.0 0 - 149 mg/dL   HDL 65.60 >39.00 mg/dL   VLDL 28.8 0.0 - 40.0 mg/dL   LDL Cholesterol 46 0 - 99 mg/dL   Total CHOL/HDL Ratio 2    NonHDL 75.00    Assessment & Plan:  This visit occurred during the SARS-CoV-2 public health emergency.  Safety protocols were in place, including screening questions prior to the visit, additional usage of staff PPE, and extensive cleaning of exam room while observing appropriate contact time as indicated for disinfecting solutions.   Problem List Items Addressed This Visit    Systolic murmur    Harsh sounding - will update echocardiogram.       Relevant Orders   ECHOCARDIOGRAM COMPLETE   Obesity, Class I, BMI 30-34.9    Congratulated on weight loss to date. Pt motivated to continue healthy diet and lifestyle changes.       Medicare annual wellness visit, initial - Primary    I have personally reviewed the Medicare Annual Wellness questionnaire and have noted 1. The patient's medical and social history 2. Their use of alcohol, tobacco or illicit drugs 3. Their current medications and supplements 4. The patient's functional ability including ADL's, fall risks,  home safety risks and hearing or visual impairment. Cognitive function has been assessed and addressed as indicated.  5. Diet and physical activity 6. Evidence for depression or mood disorders The patients weight, height, BMI have been recorded in the chart. I have made referrals, counseling and provided education to the patient based on review of the above and I have provided the pt with a written personalized care plan for preventive services. Provider list updated.. See scanned questionairre as needed for further documentation. Reviewed preventative protocols and updated unless pt declined.       MDD (major depressive disorder),  recurrent, in full remission ([place])    Stable period off antidepressant.       Hyperlipidemia associated with type 2 diabetes mellitus (HCC)    Chronic, great control on crestor 13m also improved after weight loss - pt requests trial lower dose 571mdaily.  The 10-year ASCVD risk score (GMikey BussingC JrBrooke Bonito et al., 2013) is: 19.1%   Values used to calculate the score:     Age: [age]     Sex: Male     Is Non-Hispanic African American: No     Diabetic: Yes     Tobacco smoker: No     Systolic Blood Pressure: 12166mHg     Is BP treated: Yes     HDL Cholesterol: 65.6 mg/dL     Total Cholesterol: 141 mg/dL       Relevant Medications   rosuvastatin (CRESTOR) 5 MG tablet   losartan (COZAAR) 100 MG tablet   glimepiride (AMARYL) 2 MG tablet   Hepatic steatosis    Ongoing RUQ abd pain. Update abd [place]      GERD (gastroesophageal reflux disease)    Stable period on PRN OTC PPI after significant weight loss.       Relevant Medications   omeprazole (PRILOSEC) 20 MG capsule   Essential hypertension    Chronic, stable. Continue losartan.       Relevant Medications   rosuvastatin (CRESTOR) 5 MG tablet   sildenafil (REVATIO) 20 MG tablet   losartan (COZAAR) 100 MG tablet   ERECTILE DYSFUNCTION, ORGANIC    Stable period on generic sildenafil.       Controlled diabetes mellitus type 2 with complications [place]   Wonderful control. Followed by endo. On lower amaryl dose.       Relevant Medications   rosuvastatin (CRESTOR) 5 MG tablet   losartan (COZAAR) 100 MG tablet   glimepiride (AMARYL) 2 MG tablet   Continuous RUQ abdominal pain    Ongoing for at least 5 years, s/p reassuring GI evaluation. Known h/o hepatic steatosis with hepatomegaly on CT scan 2018. Will update abd [place] anticipate improvement in fatty liver with recent weight loss      Relevant Orders   US[place]abdomen Complete   Aortic atherosclerosis (HCC)    Continue aspirin, statin.       Relevant Medications    rosuvastatin (CRESTOR) 5 MG tablet   sildenafil (REVATIO) 20 MG tablet   losartan (COZAAR) 100 MG tablet   Advanced care planning/counseling discussion    Advanced planning discussion - has this at home, currently updating. HCPOA would be wife then oldest child. Asked to bring [place].           Meds ordered this encounter  Medications  . omeprazole (PRILOSEC) 20 MG capsule    Sig: Take 1 capsule (20 mg total) by mouth daily as needed.  . rosuvastatin (CRESTOR) 5  MG tablet    Sig: Take 1 tablet (5 mg total) by mouth daily.    Dispense:  90 tablet    Refill:  3    Note new dose  . sildenafil (REVATIO) 20 MG tablet    Sig: Take 2-5 tablets (40-100 mg total) by mouth daily as needed (relations).    Dispense:  30 tablet    Refill:  3  . losartan (COZAAR) 100 MG tablet    Sig: Take 1 tablet (100 mg total) by mouth daily.    Dispense:  90 tablet    Refill:  3  . glimepiride (AMARYL) 2 MG tablet    Sig: Take 1 tablet (2 mg total) by mouth at bedtime.    Dispense:  90 tablet    Refill:  3   Orders Placed This Encounter  Procedures  . US Abdomen Complete    Standing Status:   Future    Standing Expiration Date:   12/24/2020    Order Specific Question:   Reason for Exam (SYMPTOM  OR DIAGNOSIS REQUIRED)    Answer:   chronic abd pain, known h/o HS with hepatomegaly    Order Specific Question:   Preferred imaging location?    Answer:   [facility]  . ECHOCARDIOGRAM COMPLETE    Standing Status:   Future    Standing Expiration Date:   12/24/2020    Order Specific Question:   Where should this test be performed    Answer:   [facility])    Order Specific Question:   Does the patient weigh less than or greater than 250 lbs?    Answer:   Patient weighs less than 250 lbs    Order Specific Question:   Perflutren DEFINITY (image enhancing agent) should be administered unless hypersensitivity or allergy exist    Answer:   Administer Perflutren    Order  Specific Question:   Is a special reader required? (athlete or structural heart)    Answer:   No    Order Specific Question:   Does this study need to be read by the Structural team/Level 3 readers?    Answer:   No    Order Specific Question:   Reason for exam-Echo    Answer:   Murmur  785.2 / R01.1    Patient instructions: Pnemovax final pneumonia shot today.  Drop crestor dose to 64m daily.  We will check heart ultrasound for murmur.  We will check abdominal ultrasound as well for ongoing right upper quadrant abdominal pain.  Return in 6 months for prostate blood test and in 1 year for next wellness visit.   Follow up plan: Return in about 1 year (around 12/24/2020), or if symptoms worsen or fail to improve, for annual exam, prior fasting for blood work.  JRia Bush MD

## 2019-12-25 NOTE — Assessment & Plan Note (Signed)
Congratulated on weight loss to date. Pt motivated to continue healthy diet and lifestyle changes.

## 2019-12-25 NOTE — Assessment & Plan Note (Signed)

## 2019-12-25 NOTE — Assessment & Plan Note (Signed)
Harsh sounding - will update echocardiogram.

## 2019-12-25 NOTE — Assessment & Plan Note (Signed)
Stable period on PRN OTC PPI after significant weight loss.

## 2019-12-25 NOTE — Assessment & Plan Note (Signed)
Stable period on generic sildenafil.

## 2019-12-25 NOTE — Assessment & Plan Note (Signed)
Stable period off antidepressant 

## 2019-12-25 NOTE — Assessment & Plan Note (Addendum)
Ongoing RUQ abd pain. Update abd [place].

## 2019-12-25 NOTE — Assessment & Plan Note (Signed)
Advanced planning discussion - has this at home, currently updating. HCPOA would be wife then oldest child. Asked to bring us copy. 

## 2019-12-25 NOTE — Addendum Note (Signed)
Addended by: Brenton Grills on: 1/64/2903 79:55 AM   Modules accepted: Orders

## 2019-12-25 NOTE — Patient Instructions (Addendum)
Pnemovax final pneumonia shot today.  Drop crestor dose to 5mg  daily.  We will check heart ultrasound for murmur.  We will check abdominal ultrasound as well for ongoing right upper quadrant abdominal pain.  Return in 6 months for prostate blood test and in 1 year for next wellness visit.   Health Maintenance After Age 66 After age 35, you are at a higher risk for certain long-term diseases and infections as well as injuries from falls. Falls are a major cause of broken bones and head injuries in people who are older than age 102. Getting regular preventive care can help to keep you healthy and well. Preventive care includes getting regular testing and making lifestyle changes as recommended by your health care provider. Talk with your health care provider about:  Which screenings and tests you should have. A screening is a test that checks for a disease when you have no symptoms.  A diet and exercise plan that is right for you. What should I know about screenings and tests to prevent falls? Screening and testing are the best ways to find a health problem early. Early diagnosis and treatment give you the best chance of managing medical conditions that are common after age 87. Certain conditions and lifestyle choices may make you more likely to have a fall. Your health care provider may recommend:  Regular vision checks. Poor vision and conditions such as cataracts can make you more likely to have a fall. If you wear glasses, make sure to get your prescription updated if your vision changes.  Medicine review. Work with your health care provider to regularly review all of the medicines you are taking, including over-the-counter medicines. Ask your health care provider about any side effects that may make you more likely to have a fall. Tell your health care provider if any medicines that you take make you feel dizzy or sleepy.  Osteoporosis screening. Osteoporosis is a condition that causes the bones  to get weaker. This can make the bones weak and cause them to break more easily.  Blood pressure screening. Blood pressure changes and medicines to control blood pressure can make you feel dizzy.  Strength and balance checks. Your health care provider may recommend certain tests to check your strength and balance while standing, walking, or changing positions.  Foot health exam. Foot pain and numbness, as well as not wearing proper footwear, can make you more likely to have a fall.  Depression screening. You may be more likely to have a fall if you have a fear of falling, feel emotionally low, or feel unable to do activities that you used to do.  Alcohol use screening. Using too much alcohol can affect your balance and may make you more likely to have a fall. What actions can I take to lower my risk of falls? General instructions  Talk with your health care provider about your risks for falling. Tell your health care provider if: ? You fall. Be sure to tell your health care provider about all falls, even ones that seem minor. ? You feel dizzy, sleepy, or off-balance.  Take over-the-counter and prescription medicines only as told by your health care provider. These include any supplements.  Eat a healthy diet and maintain a healthy weight. A healthy diet includes low-fat dairy products, low-fat (lean) meats, and fiber from whole grains, beans, and lots of fruits and vegetables. Home safety  Remove any tripping hazards, such as rugs, cords, and clutter.  Install safety equipment such  as grab bars in bathrooms and safety rails on stairs.  Keep rooms and walkways well-lit. Activity   Follow a regular exercise program to stay fit. This will help you maintain your balance. Ask your health care provider what types of exercise are appropriate for you.  If you need a cane or walker, use it as recommended by your health care provider.  Wear supportive shoes that have nonskid  soles. Lifestyle  Do not drink alcohol if your health care provider tells you not to drink.  If you drink alcohol, limit how much you have: ? 0-1 drink a day for women. ? 0-2 drinks a day for men.  Be aware of how much alcohol is in your drink. In the [place] one drink equals one typical bottle of beer (12 oz), one-half glass of wine (5 oz), or one shot of hard liquor (1 oz).  Do not use any products that contain nicotine or tobacco, such as cigarettes and e-cigarettes. If you need help quitting, ask your health care provider. Summary  Having a healthy lifestyle and getting preventive care can help to protect your health and wellness after age 16.  Screening and testing are the best way to find a health problem early and help you avoid having a fall. Early diagnosis and treatment give you the best chance for managing medical conditions that are more common for people who are older than age 50.  Falls are a major cause of broken bones and head injuries in people who are older than age 7. Take precautions to prevent a fall at home.  Work with your health care provider to learn what changes you can make to improve your health and wellness and to prevent falls. This information is not intended to replace advice given to you by your health care provider. Make sure you discuss any questions you have with your health care provider. Document Revised: 09/22/2018 Document Reviewed: 04/14/2017 Elsevier Patient Education  2020 [employer].

## 2019-12-25 NOTE — Assessment & Plan Note (Signed)
Chronic, great control on crestor 10mg  also improved after weight loss - pt requests trial lower dose 5mg  daily.  The 10-year ASCVD risk score Mikey Bussing DC Brooke Bonito., et al., 2013) is: 19.1%   Values used to calculate the score:     Age: 64 years     Sex: Male     Is Non-Hispanic African American: No     Diabetic: Yes     Tobacco smoker: No     Systolic Blood Pressure: 941 mmHg     Is BP treated: Yes     HDL Cholesterol: 65.6 mg/dL     Total Cholesterol: 141 mg/dL

## 2019-12-25 NOTE — Assessment & Plan Note (Signed)
Chronic, stable. Continue losartan.  

## 2019-12-25 NOTE — Assessment & Plan Note (Signed)
Continue aspirin, statin.  

## 2020-01-08 ENCOUNTER — Ambulatory Visit
Admission: RE | Admit: 2020-01-08 | Discharge: 2020-01-08 | Disposition: A | Discharge status: Home / Self Care | Payer: Medicare Other | Source: Ambulatory Visit | Attending: Family Medicine | Admitting: Family Medicine

## 2020-01-08 DIAGNOSIS — K760 Fatty (change of) liver, not elsewhere classified: Secondary | ICD-10-CM | POA: Diagnosis not present

## 2020-01-08 DIAGNOSIS — K7689 Other specified diseases of liver: Secondary | ICD-10-CM | POA: Diagnosis not present

## 2020-01-08 DIAGNOSIS — R1011 Right upper quadrant pain: Secondary | ICD-10-CM

## 2020-01-14 ENCOUNTER — Other Ambulatory Visit: Payer: Self-pay | Admitting: Family Medicine

## 2020-01-14 ENCOUNTER — Encounter: Payer: Self-pay | Admitting: Family Medicine

## 2020-01-15 ENCOUNTER — Other Ambulatory Visit: Payer: Self-pay

## 2020-01-15 ENCOUNTER — Ambulatory Visit (HOSPITAL_COMMUNITY): Payer: Medicare Other | Attending: Cardiology

## 2020-01-15 DIAGNOSIS — R011 Cardiac murmur, unspecified: Secondary | ICD-10-CM | POA: Insufficient documentation

## 2020-01-15 LAB — ECHOCARDIOGRAM COMPLETE
AR max vel: 1.25 cm2
AV Area VTI: 1.19 cm2
AV Area mean vel: 1.15 cm2
AV Mean grad: 37 mmHg
AV Peak grad: 59.5 mmHg
Ao pk vel: 3.86 m/s
Area-P 1/2: 2.56 cm2
P 1/2 time: 412 msec
S' Lateral: 3 cm

## 2020-01-26 ENCOUNTER — Other Ambulatory Visit: Payer: Self-pay | Admitting: Family Medicine

## 2020-01-26 ENCOUNTER — Encounter: Payer: Self-pay | Admitting: Family Medicine

## 2020-01-26 DIAGNOSIS — I350 Nonrheumatic aortic (valve) stenosis: Secondary | ICD-10-CM

## 2020-01-29 ENCOUNTER — Encounter: Payer: Self-pay | Admitting: Internal Medicine

## 2020-01-29 ENCOUNTER — Other Ambulatory Visit: Payer: Self-pay

## 2020-01-29 ENCOUNTER — Ambulatory Visit (INDEPENDENT_AMBULATORY_CARE_PROVIDER_SITE_OTHER): Payer: Medicare Other | Admitting: Internal Medicine

## 2020-01-29 VITALS — BP 124/80 | HR 82 | Ht 71.0 in | Wt 231.0 lb

## 2020-01-29 DIAGNOSIS — E785 Hyperlipidemia, unspecified: Secondary | ICD-10-CM | POA: Diagnosis not present

## 2020-01-29 DIAGNOSIS — E1169 Type 2 diabetes mellitus with other specified complication: Secondary | ICD-10-CM

## 2020-01-29 DIAGNOSIS — I10 Essential (primary) hypertension: Secondary | ICD-10-CM

## 2020-01-29 DIAGNOSIS — I350 Nonrheumatic aortic (valve) stenosis: Secondary | ICD-10-CM | POA: Diagnosis not present

## 2020-01-29 DIAGNOSIS — E118 Type 2 diabetes mellitus with unspecified complications: Secondary | ICD-10-CM | POA: Diagnosis not present

## 2020-01-29 NOTE — Patient Instructions (Signed)
Medication Instructions:  CONTINUE WITH CURRENT MEDICATIONS. NO CHANGES.  *If you need a refill on your cardiac medications before your next appointment, please call your pharmacy*   Testing/Procedures: in 6 mo Your physician has requested that you have an echocardiogram. Echocardiography is a painless test that uses sound waves to create images of your heart. It provides your doctor with information about the size and shape of your heart and how well your heart's chambers and valves are working. This procedure takes approximately one hour. There are no restrictions for this procedure.  [address]   Follow-Up: At [facility], you and your health needs are our priority.  As part of our continuing mission to provide you with exceptional heart care, we have created designated Provider Care Teams.  These Care Teams include your primary Cardiologist (physician) and Advanced Practice Providers (APPs -  Physician Assistants and Nurse Practitioners) who all work together to provide you with the care you need, when you need it.  We recommend signing up for the patient portal called "MyChart".  Sign up information is provided on this After Visit Summary.  MyChart is used to connect with patients for Virtual Visits (Telemedicine).  Patients are able to view lab/test results, encounter notes, upcoming appointments, etc.  Non-urgent messages can be sent to your provider as well.   To learn more about what you can do with MyChart, go to [email or web address].    Your next appointment:   6 month(s)  The format for your next appointment:   In Person  Provider:   Cherlynn Kaiser, MD

## 2020-01-29 NOTE — Progress Notes (Signed)
Cardiology Office Note:    Date:  01/29/2020   ID:  [name], DOB 04/22/1956, MRN [id number]  PCP:  Ria Bush, MD  Cardiologist:  No primary care provider on file.  Electrophysiologist:  None   Referring MD: Ria Bush, MD   Chief Complaint: Moderate aortic valve stenosis  History of Present Illness:    Evan [name] is a 64 y.o. male with a history of depression, hyperlipidemia, hypertension, diabetes well controlled, nonalcoholic fatty liver disease, who presents today for evaluation of systolic murmur found to have moderate aortic valve stenosis on echocardiogram recently performed.  Patient tells me that he has had a murmur all his life and this excluded him for participating in the [employer].  He had an echocardiogram approximately 20 years ago, when he would have been in his mid 8s, where he was told he had mild aortic valve stenosis.  He has been aware of aortic stenosis but has not had an interval echocardiogram that he can recall.  He has never been told that he has a bicuspid aortic valve, however we discussed today that I am somewhat suspicious for this given early onset aortic valve stenosis and history of a murmur.  Interestingly, his wife has a known bicuspid aortic valve, but fortunately has no significant sequela from this as of yet.  We discussed first-degree family screening with echocardiogram in detail and discussed natural history of progression of aortic valve stenosis as well as potential dilation of ascending aorta if he does not fact have a bicuspid aortic valve.  I have independently reviewed the echocardiogram images and he has an indeterminate number of aortic valve cusp as far as I can tell.  He was noted to have a 37 mm ascending aorta, which may be upper limit of normal for his age when indexed to [place].  He denies cardiopulmonary symptoms.  He walks 5 miles a day without symptoms.  He notes occasional lightheadedness in the morning.  He will  drink some vegetable juice and attributes this sensation to taking his pills on an empty stomach.  He denies resting or exertional chest pain, chest pressure, shortness of breath.  Denies syncope or prominent lightheadedness.  Past Medical History:  Diagnosis Date  . Depression    intolerant of cymbalta and wellbutrin  . Diverticulosis of colon   . Esophagitis 03/2015   by EGD  . Gastritis 03/2015   by EGD - mild, chronic  . Hyperlipidemia   . Hypertension   . Lichen simplex chronicus 05/2015   R scrotum [name])  . Murmur    aortic sclerosis, SBE  prophlaxis not needed as per cardio  . NAFLD (nonalcoholic fatty liver disease) 01/2015   by [place]  . 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 T2DM (type 2 diabetes mellitus) ([place])    DSME 09/2013    Past Surgical History:  Procedure Laterality Date  . COLONOSCOPY  2006   diverticulosis per pt  . COLONOSCOPY  03/2015   2 polyps, rpt 5 yrs [name])  . ESOPHAGOGASTRODUODENOSCOPY  03/2015   mild chronic gastritis, esophagitis   . LUMBAR LAMINECTOMY  1989   L5-S1    Current Medications: Current Meds  Medication Sig  . aspirin 81 MG tablet Take 81 mg by mouth daily.    . Blood Glucose Monitoring Suppl (ONETOUCH VERIO FLEX SYSTEM) w/Device KIT 1 kit by Does not apply route daily. Use to check blood sugars 2 times daily  . Cholecalciferol (VITAMIN  D3) 1000 units CAPS Take 1 capsule (1,000 Units total) by mouth daily.  . Cyanocobalamin (VITAMIN B12) 3000 MCG SUBL Place 3,000 mcg under the tongue.  . dapagliflozin propanediol (FARXIGA) 10 MG TABS tablet Take 10 mg by mouth every morning. Take 1 tablet by mouth every morning.  Marland Kitchen glimepiride (AMARYL) 2 MG tablet Take 1 tablet (2 mg total) by mouth at bedtime.  Marland Kitchen ibuprofen (ADVIL,MOTRIN) 200 MG tablet Take 200 mg by mouth at bedtime as needed.  Marland Kitchen losartan (COZAAR) 100 MG tablet Take 1 tablet (100 mg total) by mouth daily.  . metFORMIN (GLUCOPHAGE) 1000 MG tablet Take 1 tablet  (1,000 mg total) by mouth 2 (two) times daily with a meal.  . omeprazole (PRILOSEC) 20 MG capsule Take 1 capsule (20 mg total) by mouth daily as needed.  Glory Rosebush VERIO test strip USE TO CHECK BLOOD SUGARS 2 TIMES DAILY  . OZEMPIC, 1 MG/DOSE, 2 MG/1.5ML SOPN INJECT 1MG INTO THE SKIN ONCE A WEEK  . rosuvastatin (CRESTOR) 5 MG tablet Take 1 tablet (5 mg total) by mouth daily.  . sildenafil (REVATIO) 20 MG tablet Take 2-5 tablets (40-100 mg total) by mouth daily as needed (relations).  . Turmeric 500 MG CAPS Take by mouth.     Allergies:   Penicillins   Social History   Socioeconomic History  . Marital status: Married    Spouse name: Not on file  . Number of children: Not on file  . Years of education: Not on file  . Highest education level: Not on file  Occupational History  . Not on file  Tobacco Use  . Smoking status: Never Smoker  . Smokeless tobacco: Never Used  Substance and Sexual Activity  . Alcohol use: Yes    Alcohol/week: 0.0 standard drinks    Comment: occasional  . Drug use: No  . Sexual activity: Not on file  Other Topics Concern  . 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  Keeps record of caloric intake   Social Determinants of Health   Financial Resource Strain:   . Difficulty of Paying Living Expenses:   Food Insecurity:   . Worried About RCharity fundraiserin the Last Year:   . RArboriculturistin the Last Year:   Transportation Needs:   . LFilm/video editor(Medical):   .Marland KitchenLack of Transportation (Non-Medical):   Physical Activity:   . Days of Exercise per Week:   . Minutes of Exercise per Session:   Stress:   . Feeling of Stress :   Social Connections:   . Frequency of Communication with Friends and  Family:   . Frequency of Social Gatherings with Friends and Family:   . Attends Religious Services:   . Active Member of Clubs or Organizations:   . Attends CArchivistMeetings:   .Marland KitchenMarital Status:      Family History: The patient's family history includes Alcohol abuse in his brother and mother; Alzheimer's disease in his father; CAD in his brother; Coronary artery disease in his maternal uncle; Diabetes in his brother and maternal grandfather; Emphysema in his mother; Hyperlipidemia in his father; Hypertension in his father; Prostate cancer in his brother; Schizophrenia in his  paternal uncle; Stroke in his father, maternal grandfather, maternal grandmother, paternal grandfather, and paternal grandmother.  ROS:   Please see the history of present illness.    All other systems reviewed and are negative.  EKGs/Labs/Other Studies Reviewed:    The following studies were reviewed today:  EKG: Sinus rhythm, rate 82 bpm, voltage criteria for LVH  Recent Labs: 12/19/2019: ALT 16; BUN 14; Creatinine, Ser 1.01; Potassium 4.9; Sodium 139  Recent Lipid Panel    Component Value Date/Time   CHOL 141 12/19/2019 0814   TRIG 144.0 12/19/2019 0814   HDL 65.60 12/19/2019 0814   CHOLHDL 2 12/19/2019 0814   VLDL 28.8 12/19/2019 0814   LDLCALC 46 12/19/2019 0814   LDLDIRECT 99.0 05/20/2015 0955    Physical Exam:    VS:  BP 124/80   Pulse 82   Ht '5\' 11"'  (1.803 m)   Wt 231 lb (104.8 kg)   SpO2 90%   BMI 32.22 kg/m     Wt Readings from Last 5 Encounters:  01/29/20 231 lb (104.8 kg)  12/25/19 234 lb 4 oz (106.3 kg)  12/12/19 238 lb 9.6 oz (108.2 kg)  09/27/19 262 lb 9 oz (119.1 kg)  09/07/19 270 lb 9.6 oz (122.7 kg)     Constitutional: No acute distress Eyes: sclera non-icteric, normal conjunctiva and lids ENMT: normal dentition, moist mucous membranes Cardiovascular: regular rhythm, normal rate, 3/6 late peaking systolic ejection murmur, preserved S2. S1 normal. No jugular  venous distention.  Respiratory: clear to auscultation bilaterally GI : normal bowel sounds, soft and nontender. No distention.   MSK: extremities warm, well perfused. No edema.  NEURO: grossly nonfocal exam, moves all extremities. PSYCH: alert and oriented x 3, normal mood and affect.   ASSESSMENT:    1. Moderate aortic stenosis by prior echocardiogram   2. Essential hypertension   3. Hyperlipidemia associated with type 2 diabetes mellitus ([place])   4. Controlled type 2 diabetes mellitus with complication, without long-term current use of insulin (HCC)    PLAN:    Stage B progressive aortic valve stenosis- Moderate aortic stenosis by prior echocardiogram - Plan: EKG 12-Lead, ECHOCARDIOGRAM COMPLETE -Aortic valve gradient is 37 mmHg with an aortic valve area approximated at 1.15 cm.  This is approaching severe and we should recheck his aortic valve gradient in 6 months.  Fortunately he is asymptomatic per his report.  He walks 5 miles a day and when we discussed objective testing for symptoms and aortic stenosis, he feels that this may not be necessary since he can closely monitor her symptoms at home.  This is reasonable.  Continue aspirin and statin for the indication of aortic stenosis.  Essential hypertension - Plan: EKG 12-Lead, ECHOCARDIOGRAM COMPLETE --Blood pressure well controlled on losartan 100 mg daily, continue.  Hyperlipidemia associated with type 2 diabetes mellitus (HCC)-continue Crestor 5 mg daily.  Lipids well controlled.  Controlled type 2 diabetes mellitus with complication, without long-term current use of insulin (HCC)-on Farxiga and Amaryl.  Not on insulin.  A1c 5.5 on recent check.  Continue statin.  Management of diabetes per PCP.  Total time of encounter: 45 minutes total time of encounter, including 25 minutes spent in face-to-face patient care on the date of this encounter. This time includes coordination of care and counseling regarding above mentioned problem  list. Remainder of non-face-to-face time involved reviewing chart documents/testing relevant to the patient encounter and documentation in the medical record. I have independently reviewed documentation from referring provider.   Nadean Corwin  Margaretann Loveless, [facility]    Medication Adjustments/Labs and Tests Ordered: Current medicines are reviewed at length with the patient today.  Concerns regarding medicines are outlined above.  Orders Placed This Encounter  Procedures  . EKG 12-Lead  . ECHOCARDIOGRAM COMPLETE   No orders of the defined types were placed in this encounter.   Patient Instructions  Medication Instructions:  CONTINUE WITH CURRENT MEDICATIONS. NO CHANGES.  *If you need a refill on your cardiac medications before your next appointment, please call your pharmacy*   Testing/Procedures: in 6 mo Your physician has requested that you have an echocardiogram. Echocardiography is a painless test that uses sound waves to create images of your heart. It provides your doctor with information about the size and shape of your heart and how well your heart's chambers and valves are working. This procedure takes approximately one hour. There are no restrictions for this procedure.  [address]   Follow-Up: At [facility], you and your health needs are our priority.  As part of our continuing mission to provide you with exceptional heart care, we have created designated Provider Care Teams.  These Care Teams include your primary Cardiologist (physician) and Advanced Practice Providers (APPs -  Physician Assistants and Nurse Practitioners) who all work together to provide you with the care you need, when you need it.  We recommend signing up for the patient portal called "MyChart".  Sign up information is provided on this After Visit Summary.  MyChart is used to connect with patients for Virtual Visits (Telemedicine).  Patients are able to view lab/test results,  encounter notes, upcoming appointments, etc.  Non-urgent messages can be sent to your provider as well.   To learn more about what you can do with MyChart, go to [email or web address].    Your next appointment:   6 month(s)  The format for your next appointment:   In Person  Provider:   Cherlynn Kaiser, MD

## 2020-02-07 ENCOUNTER — Other Ambulatory Visit: Payer: Self-pay

## 2020-02-07 MED ORDER — DAPAGLIFLOZIN PROPANEDIOL 10 MG PO TABS: 10.0000 mg | ORAL_TABLET | Freq: Every morning | ORAL | 2 refills | Status: DC

## 2020-03-11 ENCOUNTER — Other Ambulatory Visit: Payer: Self-pay

## 2020-03-11 ENCOUNTER — Other Ambulatory Visit (INDEPENDENT_AMBULATORY_CARE_PROVIDER_SITE_OTHER): Payer: Medicare Other

## 2020-03-11 DIAGNOSIS — E1169 Type 2 diabetes mellitus with other specified complication: Secondary | ICD-10-CM

## 2020-03-11 DIAGNOSIS — E669 Obesity, unspecified: Secondary | ICD-10-CM | POA: Diagnosis not present

## 2020-03-11 DIAGNOSIS — E7800 Pure hypercholesterolemia, unspecified: Secondary | ICD-10-CM | POA: Diagnosis not present

## 2020-03-11 LAB — COMPREHENSIVE METABOLIC PANEL
ALT: 17 U/L (ref 0–53)
AST: 16 U/L (ref 0–37)
Albumin: 4.5 g/dL (ref 3.5–5.2)
Alkaline Phosphatase: 48 U/L (ref 39–117)
BUN: 20 mg/dL (ref 6–23)
CO2: 28 mEq/L (ref 19–32)
Calcium: 9.6 mg/dL (ref 8.4–10.5)
Chloride: 99 mEq/L (ref 96–112)
Creatinine, Ser: 0.9 mg/dL (ref 0.40–1.50)
GFR: 84.24 mL/min (ref >60.00)
Glucose, Bld: 90 mg/dL (ref 70–99)
Potassium: 4.8 mEq/L (ref 3.5–5.1)
Sodium: 136 mEq/L (ref 135–145)
Total Bilirubin: 0.4 mg/dL (ref 0.2–1.2)
Total Protein: 7.4 g/dL (ref 6.0–8.3)

## 2020-03-11 LAB — LIPID PANEL
Cholesterol: 165 mg/dL (ref 0–200)
HDL: 82.5 mg/dL (ref >39.00)
LDL Cholesterol: 71 mg/dL (ref 0–99)
NonHDL: 82.96
Total CHOL/HDL Ratio: 2
Triglycerides: 59 mg/dL (ref 0.0–149.0)
VLDL: 11.8 mg/dL (ref 0.0–40.0)

## 2020-03-11 LAB — HEMOGLOBIN A1C: Hgb A1c MFr Bld: 5.6 % (ref 4.6–6.5)

## 2020-03-13 ENCOUNTER — Other Ambulatory Visit: Payer: Self-pay

## 2020-03-13 ENCOUNTER — Ambulatory Visit (INDEPENDENT_AMBULATORY_CARE_PROVIDER_SITE_OTHER): Payer: Medicare Other | Admitting: Endocrinology

## 2020-03-13 ENCOUNTER — Encounter: Payer: Self-pay | Admitting: Endocrinology

## 2020-03-13 VITALS — BP 126/84 | HR 78 | Ht 71.0 in | Wt 222.0 lb

## 2020-03-13 DIAGNOSIS — E7800 Pure hypercholesterolemia, unspecified: Secondary | ICD-10-CM

## 2020-03-13 DIAGNOSIS — Z23 Encounter for immunization: Secondary | ICD-10-CM | POA: Diagnosis not present

## 2020-03-13 DIAGNOSIS — E1169 Type 2 diabetes mellitus with other specified complication: Secondary | ICD-10-CM

## 2020-03-13 DIAGNOSIS — E669 Obesity, unspecified: Secondary | ICD-10-CM | POA: Diagnosis not present

## 2020-03-13 LAB — POCT GLUCOSE (DEVICE FOR HOME USE): Glucose Fasting, POC: 88 mg/dL (ref 70–99)

## 2020-03-13 NOTE — Patient Instructions (Signed)
Stop Glimeperide 

## 2020-03-13 NOTE — Progress Notes (Signed)
Patient ID: Evan [name], male   DOB: [birth year], 64 y.o.   MRN: [id number]          Reason for Appointment:Type 2 Diabetes and associated problems   History of Present Illness:          Date of diagnosis of type 2 diabetes mellitus: 2012        Background history:   Diagnosed incidentally with a high blood sugar Has been on metformin since diagnosis and subsequently Amaryl had been added He has had sporadic follow-up for the last few years His A1c had been below 7 to about 08/2015 Subsequently in 9/17 his A1c had gone up to 8%  Recent history:      Non-insulin hypoglycemic drugs : Farxiga 10 mg, Amaryl 41m hs ., metformin 1 g twice a day, Ozempic 1 mg weekly   His A1c is again below 6% and about the same at 5.6  She will   Current management, blood sugar patterns and problems identified:  He has only been taking 1 mg Amaryl, previously had been on 4 mg at one time  Even with reducing reduce his glimepiride his blood sugars are still about the same and mostly near normal  He does not feel hypoglycemic at any time  He has continued to do what ever exercise he can do with walking and other activities like push-ups  Since his last visit he has lost 16 pounds and continues to do well  He has tried to continue relatively low carbohydrate and low calorie diet most of the time  No nausea with Ozempic  Blood sugars are being checked tomorrow morning and about 3 hours after dinner  Meals: Usually skipping breakfast, dinner is usually a protein with vegetables or salad, low-fat intake       Side effects from medications have been: None  Glucose monitoring:  done 1 or less times a day         Glucometer: One Touch Verio .      Blood Glucose readings by review of monitor, did not have a monitor for download   PRE-MEAL Fasting Lunch  bedtime  Overall  Glucose range: 76-105   <114    Mean/median:      98   Previous readings:  PRE-MEAL Fasting Lunch Dinner Bedtime Overall   Glucose range:  74-133   85-103  77-117   Mean/median:  107  104   97  102    Self-care: The diet that the patient has been following is: tries to reduce high-fat and fried foods, Carbohydrates and avoiding drinks with sugar .       Dietician visit, most recent: 02/2017  Weight history:  Wt Readings from Last 3 Encounters:  03/13/20 222 lb (100.7 kg)  01/29/20 231 lb (104.8 kg)  12/25/19 234 lb 4 oz (106.3 kg)    Glycemic control:   Lab Results  Component Value Date   HGBA1C 5.6 03/11/2020   HGBA1C 5.5 12/05/2019   HGBA1C 6.9 (H) 09/04/2019   Lab Results  Component Value Date   MICROALBUR <0.7 09/04/2019   LDLCALC 71 03/11/2020   CREATININE 0.90 03/11/2020   Lab Results  Component Value Date   MICRALBCREAT 4.3 09/04/2019    Lab Results  Component Value Date   FRUCTOSAMINE 224 12/19/2019   FRUCTOSAMINE 239 12/13/2017   FRUCTOSAMINE 268 03/12/2017      Allergies as of 03/13/2020      Reactions   Penicillins    Respiratory distress  as infant      Medication List       Accurate as of March 13, 2020  9:52 AM. If you have any questions, ask your nurse or doctor.        aspirin 81 MG tablet Take 81 mg by mouth daily.   dapagliflozin propanediol 10 MG Tabs tablet Commonly known as: Farxiga Take 1 tablet (10 mg total) by mouth every morning. Take 1 tablet by mouth every morning.   glimepiride 2 MG tablet Commonly known as: AMARYL Take 1 tablet (2 mg total) by mouth at bedtime.   ibuprofen 200 MG tablet Commonly known as: ADVIL Take 200 mg by mouth at bedtime as needed.   losartan 100 MG tablet Commonly known as: COZAAR Take 1 tablet (100 mg total) by mouth daily.   metFORMIN 1000 MG tablet Commonly known as: GLUCOPHAGE Take 1 tablet (1,000 mg total) by mouth 2 (two) times daily with a meal.   omeprazole 20 MG capsule Commonly known as: PRILOSEC Take 1 capsule (20 mg total) by mouth daily as needed.   OneTouch Verio Flex System w/Device  Kit 1 kit by Does not apply route daily. Use to check blood sugars 2 times daily   OneTouch Verio test strip Generic drug: glucose blood USE TO CHECK BLOOD SUGARS 2 TIMES DAILY   Ozempic (1 MG/DOSE) 2 MG/1.5ML Sopn Generic drug: Semaglutide (1 MG/DOSE) INJECT 1MG INTO THE SKIN ONCE A WEEK   rosuvastatin 5 MG tablet Commonly known as: CRESTOR Take 1 tablet (5 mg total) by mouth daily.   sildenafil 20 MG tablet Commonly known as: REVATIO Take 2-5 tablets (40-100 mg total) by mouth daily as needed (relations).   Turmeric 500 MG Caps Take by mouth.   Vitamin B12 3000 MCG Subl Place 3,000 mcg under the tongue.   Vitamin D3 25 MCG (1000 UT) Caps Take 1 capsule (1,000 Units total) by mouth daily.       Allergies:  Allergies  Allergen Reactions  . Penicillins     Respiratory distress as infant    Past Medical History:  Diagnosis Date  . Depression    intolerant of cymbalta and wellbutrin  . Diverticulosis of colon   . Esophagitis 03/2015   by EGD  . Gastritis 03/2015   by EGD - mild, chronic  . Hyperlipidemia   . Hypertension   . Lichen simplex chronicus 05/2015   R scrotum [name])  . Murmur    aortic sclerosis, SBE  prophlaxis not needed as per cardio  . NAFLD (nonalcoholic fatty liver disease) 01/2015   by [place]  . 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 T2DM (type 2 diabetes mellitus) ([place])    DSME 09/2013    Past Surgical History:  Procedure Laterality Date  . COLONOSCOPY  2006   diverticulosis per pt  . COLONOSCOPY  03/2015   2 polyps, rpt 5 yrs [name])  . ESOPHAGOGASTRODUODENOSCOPY  03/2015   mild chronic gastritis, esophagitis   . LUMBAR LAMINECTOMY  1989   L5-S1    Family History  Problem Relation Age of Onset  . Alzheimer's disease Father   . Hypertension Father   . Stroke Father   . Hyperlipidemia Father   . Prostate cancer Brother   . Diabetes Brother   . Alcohol abuse Brother   . CAD Brother   . Coronary artery  disease Maternal Uncle   . Schizophrenia Paternal Uncle   . Emphysema Mother   . Alcohol abuse  Mother   . Stroke Maternal Grandmother   . Stroke Maternal Grandfather   . Diabetes Maternal Grandfather   . Stroke Paternal Grandmother   . Stroke Paternal Grandfather     Social History:  reports that he has never smoked. He has never used smokeless tobacco. He reports current alcohol use. He reports that he does not use drugs.   Review of Systems  Lipid history: On Crestor 5 mg and his LDL is consistently around 70  Previously had been on 10 mg dosage   Lab Results  Component Value Date   CHOL 165 03/11/2020   HDL 82.50 03/11/2020   LDLCALC 71 03/11/2020   LDLDIRECT 99.0 05/20/2015   TRIG 59.0 03/11/2020   CHOLHDL 2 03/11/2020           Hypertension: On Losartan 100 mg, 1/2 Tablet with good control  Blood pressure has been around 676-195 systolic at KDTO/67T diastolic  BP Readings from Last 3 Encounters:  03/13/20 126/84  01/29/20 124/80  12/25/19 124/68    FATTY liver: His ALT again is normal   Lab Results  Component Value Date   ALT 17 03/11/2020     Most recent foot exam: 03/2018    LABS:  Office Visit on 03/13/2020  Component Date Value Ref Range Status  . Glucose Fasting, POC 03/13/2020 88  70 - 99 mg/dL Final  Lab on 03/11/2020  Component Date Value Ref Range Status  . Cholesterol 03/11/2020 165  0 - 200 mg/dL Final   ATP III Classification       Desirable:  < 200 mg/dL               Borderline High:  200 - 239 mg/dL          High:  > = 240 mg/dL  . Triglycerides 03/11/2020 59.0  0 - 149 mg/dL Final   Normal:  <150 mg/dLBorderline High:  150 - 199 mg/dL  . HDL 03/11/2020 82.50  >39.00 mg/dL Final  . VLDL 03/11/2020 11.8  0.0 - 40.0 mg/dL Final  . LDL Cholesterol 03/11/2020 71  0 - 99 mg/dL Final  . Total CHOL/HDL Ratio 03/11/2020 2   Final                  Men          Women1/2 Average Risk     3.4          3.3Average Risk          5.0           4.42X Average Risk          9.6          7.13X Average Risk          15.0          11.0                      . NonHDL 03/11/2020 82.96   Final   NOTE:  Non-HDL goal should be 30 mg/dL higher than patient's LDL goal (i.e. LDL goal of < 70 mg/dL, would have non-HDL goal of < 100 mg/dL)  . Sodium 03/11/2020 136  135 - 145 mEq/L Final  . Potassium 03/11/2020 4.8  3.5 - 5.1 mEq/L Final  . Chloride 03/11/2020 99  96 - 112 mEq/L Final  . CO2 03/11/2020 28  19 - 32 mEq/L Final  . Glucose, Bld 03/11/2020 90  70 - 99 mg/dL Final  .  BUN 03/11/2020 20  6 - 23 mg/dL Final  . Creatinine, Ser 03/11/2020 0.90  0.40 - 1.50 mg/dL Final  . Total Bilirubin 03/11/2020 0.4  0.2 - 1.2 mg/dL Final  . Alkaline Phosphatase 03/11/2020 48  39 - 117 U/L Final  . AST 03/11/2020 16  0 - 37 U/L Final  . ALT 03/11/2020 17  0 - 53 U/L Final  . Total Protein 03/11/2020 7.4  6.0 - 8.3 g/dL Final  . Albumin 03/11/2020 4.5  3.5 - 5.2 g/dL Final  . GFR 03/11/2020 84.24  >60.00 mL/min Final  . Calcium 03/11/2020 9.6  8.4 - 10.5 mg/dL Final  . Hgb A1c MFr Bld 03/11/2020 5.6  4.6 - 6.5 % Final   Glycemic Control Guidelines for People with Diabetes:Non Diabetic:  <6%Goal of Therapy: <7%Additional Action Suggested:  >8%     Physical Examination:  BP 126/84   Pulse 78   Ht '5\' 11"'  (1.803 m)   Wt 222 lb (100.7 kg)   SpO2 98%   BMI 30.96 kg/m     ASSESSMENT:  Diabetes type 2, non-insulin-dependent  See history of present illness for detailed discussion of current diabetes management, blood sugar patterns and problems identified     A1c is still excellent at 5.6  He is on 4 drug regimen including Farxiga and Ozempic 1 mg weekly  His blood sugars are excellent and near normal when checked fasting and at bedtime No hypoglycemia with 1 mg Amaryl He continues to lose significant amount of weight also  HYPERTENSION: Well-controlled with losartan 50 mg regimen He does monitor at home and blood pressure is usually better  at home He will continue to follow up with PCP also  LIPIDS: LDL adequately controlled with 5 mg Crestor, to continue same dose    PLAN:    We will try to see if he can do without the Amaryl 1 mg dose He will stop that for at least a week to see if his blood sugars stay under at least 120/130 Also periodically check readings 2 hours after meals No change in other medications above Encouraged him to continue with his excellent diet and exercise regimen  Follow-up in 4 months  Influenza vaccine given  There are no Patient Instructions on file for this visit.  Elayne Snare 03/13/2020, 9:52 AM   Note: This office note was prepared with Dragon voice recognition system technology. Any transcriptional errors that result from this process are unintentional.

## 2020-04-19 ENCOUNTER — Other Ambulatory Visit: Payer: Self-pay | Admitting: Endocrinology

## 2020-05-03 ENCOUNTER — Other Ambulatory Visit: Payer: Self-pay | Admitting: Endocrinology

## 2020-05-15 DIAGNOSIS — U071 COVID-19: Secondary | ICD-10-CM

## 2020-05-15 HISTORY — DX: COVID-19: U07.1

## 2020-05-24 DIAGNOSIS — H5202 Hypermetropia, left eye: Secondary | ICD-10-CM | POA: Diagnosis not present

## 2020-05-24 DIAGNOSIS — E119 Type 2 diabetes mellitus without complications: Secondary | ICD-10-CM | POA: Diagnosis not present

## 2020-05-24 LAB — HM DIABETES EYE EXAM

## 2020-05-27 ENCOUNTER — Encounter: Payer: Self-pay | Admitting: *Deleted

## 2020-05-28 ENCOUNTER — Encounter: Payer: Self-pay | Admitting: Family Medicine

## 2020-06-10 ENCOUNTER — Encounter: Payer: Self-pay | Admitting: *Deleted

## 2020-06-10 DIAGNOSIS — Z20822 Contact with and (suspected) exposure to covid-19: Secondary | ICD-10-CM | POA: Diagnosis not present

## 2020-06-11 ENCOUNTER — Encounter: Payer: Self-pay | Admitting: Family Medicine

## 2020-06-11 NOTE — Telephone Encounter (Signed)
plz place on covid call list and touch base Thurs for symptoms.

## 2020-06-12 NOTE — Telephone Encounter (Signed)
Pt added to COVID Call List.

## 2020-06-13 NOTE — Telephone Encounter (Signed)
Spoke with pt asking for update.  Says he's feeling better.  C/o slight sore throat, mild HA, runny nose and sneezing but all these sxs are improving.

## 2020-06-13 NOTE — Telephone Encounter (Addendum)
First day of symptoms was 12/24. Noted.

## 2020-06-25 ENCOUNTER — Other Ambulatory Visit: Payer: Self-pay | Admitting: Family Medicine

## 2020-06-25 DIAGNOSIS — Z125 Encounter for screening for malignant neoplasm of prostate: Secondary | ICD-10-CM

## 2020-06-25 DIAGNOSIS — E118 Type 2 diabetes mellitus with unspecified complications: Secondary | ICD-10-CM

## 2020-06-26 ENCOUNTER — Other Ambulatory Visit: Payer: Self-pay

## 2020-06-26 ENCOUNTER — Other Ambulatory Visit (INDEPENDENT_AMBULATORY_CARE_PROVIDER_SITE_OTHER): Payer: Medicare Other

## 2020-06-26 DIAGNOSIS — Z125 Encounter for screening for malignant neoplasm of prostate: Secondary | ICD-10-CM | POA: Diagnosis not present

## 2020-06-26 DIAGNOSIS — E118 Type 2 diabetes mellitus with unspecified complications: Secondary | ICD-10-CM | POA: Diagnosis not present

## 2020-06-26 LAB — BASIC METABOLIC PANEL
BUN: 17 mg/dL (ref 6–23)
CO2: 29 mEq/L (ref 19–32)
Calcium: 9.7 mg/dL (ref 8.4–10.5)
Chloride: 94 mEq/L — ABNORMAL LOW (ref 96–112)
Creatinine, Ser: 0.92 mg/dL (ref 0.40–1.50)
GFR: 86.37 mL/min (ref >60.00)
Glucose, Bld: 87 mg/dL (ref 70–99)
Potassium: 4.5 mEq/L (ref 3.5–5.1)
Sodium: 131 mEq/L — ABNORMAL LOW (ref 135–145)

## 2020-06-26 LAB — PSA, MEDICARE: PSA: 3.1 ng/ml (ref 0.10–4.00)

## 2020-06-26 LAB — HEMOGLOBIN A1C: Hgb A1c MFr Bld: 5.4 % (ref 4.6–6.5)

## 2020-06-27 ENCOUNTER — Other Ambulatory Visit: Payer: Self-pay | Admitting: Endocrinology

## 2020-06-29 ENCOUNTER — Encounter: Payer: Self-pay | Admitting: Family Medicine

## 2020-06-29 ENCOUNTER — Other Ambulatory Visit: Payer: Self-pay | Admitting: Family Medicine

## 2020-06-29 DIAGNOSIS — E871 Hypo-osmolality and hyponatremia: Secondary | ICD-10-CM

## 2020-06-29 DIAGNOSIS — Z8042 Family history of malignant neoplasm of prostate: Secondary | ICD-10-CM

## 2020-06-29 DIAGNOSIS — R972 Elevated prostate specific antigen [PSA]: Secondary | ICD-10-CM

## 2020-07-02 NOTE — Telephone Encounter (Signed)
FYI

## 2020-07-08 ENCOUNTER — Encounter: Payer: Self-pay | Admitting: Family Medicine

## 2020-07-09 NOTE — Telephone Encounter (Signed)
Referral was sent to [facility] through [facility] on 07/04/20, not scheduled yet. They are behind on scheduling. I called and after 1 hour of waiting had to hang up to address another concern. Will keep trying to reach someone to schedule patient and then will update patient.

## 2020-07-11 ENCOUNTER — Other Ambulatory Visit (INDEPENDENT_AMBULATORY_CARE_PROVIDER_SITE_OTHER): Payer: Medicare Other

## 2020-07-11 ENCOUNTER — Other Ambulatory Visit: Payer: Self-pay

## 2020-07-11 ENCOUNTER — Encounter: Payer: Self-pay | Admitting: Family Medicine

## 2020-07-11 ENCOUNTER — Ambulatory Visit (HOSPITAL_COMMUNITY): Payer: Medicare Other | Attending: Internal Medicine

## 2020-07-11 DIAGNOSIS — E1169 Type 2 diabetes mellitus with other specified complication: Secondary | ICD-10-CM | POA: Diagnosis not present

## 2020-07-11 DIAGNOSIS — I10 Essential (primary) hypertension: Secondary | ICD-10-CM | POA: Diagnosis not present

## 2020-07-11 DIAGNOSIS — I350 Nonrheumatic aortic (valve) stenosis: Secondary | ICD-10-CM | POA: Diagnosis not present

## 2020-07-11 DIAGNOSIS — E669 Obesity, unspecified: Secondary | ICD-10-CM

## 2020-07-11 LAB — COMPREHENSIVE METABOLIC PANEL
ALT: 18 U/L (ref 0–53)
AST: 17 U/L (ref 0–37)
Albumin: 4.5 g/dL (ref 3.5–5.2)
Alkaline Phosphatase: 40 U/L (ref 39–117)
BUN: 18 mg/dL (ref 6–23)
CO2: 30 mEq/L (ref 19–32)
Calcium: 10 mg/dL (ref 8.4–10.5)
Chloride: 100 mEq/L (ref 96–112)
Creatinine, Ser: 0.97 mg/dL (ref 0.40–1.50)
GFR: 81.03 mL/min (ref >60.00)
Glucose, Bld: 90 mg/dL (ref 70–99)
Potassium: 4.4 mEq/L (ref 3.5–5.1)
Sodium: 137 mEq/L (ref 135–145)
Total Bilirubin: 0.5 mg/dL (ref 0.2–1.2)
Total Protein: 7.5 g/dL (ref 6.0–8.3)

## 2020-07-11 LAB — ECHOCARDIOGRAM COMPLETE
AR max vel: 1.34 cm2
AV Area VTI: 1.38 cm2
AV Area mean vel: 1.24 cm2
AV Mean grad: 41 mmHg
AV Peak grad: 59.3 mmHg
Ao pk vel: 3.85 m/s
Area-P 1/2: 3.31 cm2
S' Lateral: 2.1 cm

## 2020-07-11 LAB — MICROALBUMIN / CREATININE URINE RATIO
Creatinine,U: 82.6 mg/dL
Microalb Creat Ratio: 0.9 mg/g (ref 0.0–30.0)
Microalb, Ur: 0.7 mg/dL (ref 0.0–1.9)

## 2020-07-11 LAB — HEMOGLOBIN A1C: Hgb A1c MFr Bld: 5.4 % (ref 4.6–6.5)

## 2020-07-11 MED ORDER — PERFLUTREN LIPID MICROSPHERE
1.0000 mL | INTRAVENOUS | Status: AC | PRN
  Administered 2020-07-11: 3 mL via INTRAVENOUS

## 2020-07-13 NOTE — Telephone Encounter (Signed)
I've cancelled upcoming appt this week - was supposed to be just lab visit but that is no longer needed either.

## 2020-07-14 NOTE — Progress Notes (Signed)
Patient ID: Evan [name], male   DOB: Feb 03, 1956, 65 y.o.   MRN: [id number]          Reason for Appointment:Type 2 Diabetes and associated problems   History of Present Illness:          Date of diagnosis of type 2 diabetes mellitus: 2012        Background history:   Diagnosed incidentally with a high blood sugar Has been on metformin since diagnosis and subsequently Amaryl had been added He has had sporadic follow-up for the last few years His A1c had been below 7 to about 08/2015 Subsequently in 9/17 his A1c had gone up to 8%  Recent history:      Non-insulin hypoglycemic drugs : Farxiga 10 mg,  metformin 1 g twice a day, Ozempic 1 mg weekly   His A1c is again in the normal range at 5.4, was 5.6   Current management, blood sugar patterns and problems identified:  He has only been taking 1 mg Amaryl when he is going off his diet which is infrequent even though he was told to stop this completely  However on occasion when he takes in the evening day morning sugar may be as low as 61  He is not consistently active recently but is trying to do some exercise such as walking when the weather allows are splitting wood  He has been able to maintain his diet with reduced calories and carbohydrate content  With this his bedtime readings are near normal and usually lower than the next morning  HIGHEST fasting blood sugar 135 recently  No change in renal function with [place]  He has had some nausea, lasting about 30 minutes in the mornings, likely with Ozempic even with taking Prilosec  Blood sugars are being checked on waking up in the morning and about 3-4 hours after dinner  Meals: Usually skipping breakfast, dinner is usually a protein with vegetables or salad, low-fat intake       Side effects from medications have been: None  Glucose monitoring:  done 1 or less times a day         Glucometer: One Touch Verio  .      Blood Glucose readings by review of monitor,  download   PRE-MEAL Fasting Lunch Dinner Bedtime Overall  Glucose range:  61-135    90-111   Mean/median:  115    96 102   POST-MEAL PC Breakfast PC Lunch PC Dinner  Glucose range:   ?  Mean/median:      Previously:  PRE-MEAL Fasting Lunch  bedtime  Overall  Glucose range: 76-105   <114    Mean/median:      98    Self-care: The diet that the patient has been following is: tries to reduce high-fat and fried foods, Carbohydrates and avoiding drinks with sugar .       Dietician visit, most recent: 02/2017  Weight history: Highest recent weight 287 in 2019  Wt Readings from Last 3 Encounters:  07/15/20 219 lb 6.4 oz (99.5 kg)  03/13/20 222 lb (100.7 kg)  01/29/20 231 lb (104.8 kg)    Glycemic control:   Lab Results  Component Value Date   HGBA1C 5.4 07/11/2020   HGBA1C 5.4 06/26/2020   HGBA1C 5.6 03/11/2020   Lab Results  Component Value Date   MICROALBUR 0.7 07/11/2020   LDLCALC 71 03/11/2020   CREATININE 0.97 07/11/2020   Lab Results  Component Value Date   MICRALBCREAT  0.9 07/11/2020    Lab Results  Component Value Date   FRUCTOSAMINE 224 12/19/2019   FRUCTOSAMINE 239 12/13/2017   FRUCTOSAMINE 268 03/12/2017      Allergies as of 07/15/2020      Reactions   Penicillins    Respiratory distress as infant      Medication List       Accurate as of July 15, 2020 10:31 AM. If you have any questions, ask your nurse or doctor.        aspirin 81 MG tablet Take 81 mg by mouth daily.   Farxiga 10 MG Tabs tablet Generic drug: dapagliflozin propanediol TAKE 1 TABLET (10 MG TOTAL) BY MOUTH EVERY MORNING. TAKE 1 TABLET BY MOUTH EVERY MORNING.   glimepiride 2 MG tablet Commonly known as: AMARYL Take 1 tablet (2 mg total) by mouth at bedtime.   ibuprofen 200 MG tablet Commonly known as: ADVIL Take 200 mg by mouth at bedtime as needed.   losartan 100 MG tablet Commonly known as: COZAAR Take 1 tablet (100 mg total) by mouth daily.   metFORMIN  1000 MG tablet Commonly known as: GLUCOPHAGE TAKE 1 TABLET (1,000 MG TOTAL) BY MOUTH 2 TIMES DAILY WITH A MEAL.   omeprazole 20 MG capsule Commonly known as: PRILOSEC Take 1 capsule (20 mg total) by mouth daily as needed.   OneTouch Verio Flex System w/Device Kit 1 kit by Does not apply route daily. Use to check blood sugars 2 times daily   OneTouch Verio test strip Generic drug: glucose blood USE TO CHECK BLOOD SUGARS 2 TIMES DAILY   Ozempic (1 MG/DOSE) 4 MG/3ML Sopn Generic drug: Semaglutide (1 MG/DOSE) Inject 1 mg as directed once a week.   rosuvastatin 5 MG tablet Commonly known as: CRESTOR Take 1 tablet (5 mg total) by mouth daily.   sildenafil 20 MG tablet Commonly known as: REVATIO Take 2-5 tablets (40-100 mg total) by mouth daily as needed (relations).   Turmeric 500 MG Caps Take by mouth.   Vitamin B12 3000 MCG Subl Place 3,000 mcg under the tongue.   Vitamin D3 25 MCG (1000 UT) Caps Take 1 capsule (1,000 Units total) by mouth daily.       Allergies:  Allergies  Allergen Reactions   Penicillins     Respiratory distress as infant    Past Medical History:  Diagnosis Date   COVID-19 virus infection 05/2020   Depression    intolerant of cymbalta and wellbutrin   Diverticulosis of colon    Esophagitis 03/2015   by EGD   Gastritis 03/2015   by EGD - mild, chronic   Hyperlipidemia    Hypertension    Lichen simplex chronicus 05/2015   R scrotum [name])   Murmur    aortic sclerosis, SBE  prophlaxis not needed as per cardio   NAFLD (nonalcoholic fatty liver disease) 01/2015   by [place]   Ophthalmic migraine    with slurred speech ([place])   T2DM (type 2 diabetes mellitus) ([place])    DSME 09/2013    Past Surgical History:  Procedure Laterality Date   COLONOSCOPY  2006   diverticulosis per pt   COLONOSCOPY  03/2015   2 polyps, rpt 5 yrs [name])   ESOPHAGOGASTRODUODENOSCOPY  03/2015   mild chronic gastritis, esophagitis    LUMBAR  LAMINECTOMY  1989   L5-S1    Family History  Problem Relation Age of Onset   Alzheimer's disease Father    Hypertension Father    Stroke  Father    Hyperlipidemia Father    Prostate cancer Brother    Diabetes Brother    Alcohol abuse Brother    CAD Brother    Coronary artery disease Maternal Uncle    Schizophrenia Paternal Uncle    Emphysema Mother    Alcohol abuse Mother    Stroke Maternal Grandmother    Stroke Maternal Grandfather    Diabetes Maternal Grandfather    Stroke Paternal Grandmother    Stroke Paternal Grandfather     Social History:  reports that he has never smoked. He has never used smokeless tobacco. He reports current alcohol use. He reports that he does not use drugs.   Review of Systems  Lipid history: On Crestor 5 mg from his PCP and his LDL is consistently around 70     Lab Results  Component Value Date   CHOL 165 03/11/2020   HDL 82.50 03/11/2020   LDLCALC 71 03/11/2020   LDLDIRECT 99.0 05/20/2015   TRIG 59.0 03/11/2020   CHOLHDL 2 03/11/2020           Hypertension: On Losartan 100 mg, 1/2 pill with good control  Blood pressure has been around 998-338 systolic at SNKN/39J diastolic  BP Readings from Last 3 Encounters:  07/15/20 128/84  03/13/20 126/84  01/29/20 124/80   Only occasionally will feel little dizziness, he has significant aortic stenosis and is due to see his cardiologist  Most recent foot exam: 3/21    LABS:  Appointment on 07/11/2020  Component Date Value Ref Range Status   Area-P 1/2 07/11/2020 3.31  cm2 Final   S' Lateral 07/11/2020 2.10  cm Final   AV Area mean vel 07/11/2020 1.24  cm2 Final   AR max vel 07/11/2020 1.34  cm2 Final   AV Area VTI 07/11/2020 1.38  cm2 Final   Ao pk vel 07/11/2020 3.85  m/s Final   AV Mean grad 07/11/2020 41.0  mmHg Final   AV Peak grad 07/11/2020 59.3  mmHg Final  Lab on 07/11/2020  Component Date Value Ref Range Status   Microalb, Ur 07/11/2020 0.7   0.0 - 1.9 mg/dL Final   Creatinine,U 07/11/2020 82.6  mg/dL Final   Microalb Creat Ratio 07/11/2020 0.9  0.0 - 30.0 mg/g Final   Sodium 07/11/2020 137  135 - 145 mEq/L Final   Potassium 07/11/2020 4.4  3.5 - 5.1 mEq/L Final   Chloride 07/11/2020 100  96 - 112 mEq/L Final   CO2 07/11/2020 30  19 - 32 mEq/L Final   Glucose, Bld 07/11/2020 90  70 - 99 mg/dL Final   BUN 07/11/2020 18  6 - 23 mg/dL Final   Creatinine, Ser 07/11/2020 0.97  0.40 - 1.50 mg/dL Final   Total Bilirubin 07/11/2020 0.5  0.2 - 1.2 mg/dL Final   Alkaline Phosphatase 07/11/2020 40  39 - 117 U/L Final   AST 07/11/2020 17  0 - 37 U/L Final   ALT 07/11/2020 18  0 - 53 U/L Final   Total Protein 07/11/2020 7.5  6.0 - 8.3 g/dL Final   Albumin 07/11/2020 4.5  3.5 - 5.2 g/dL Final   GFR 07/11/2020 81.03  >60.00 mL/min Final   Calculated using the CKD-EPI Creatinine Equation (2021)   Calcium 07/11/2020 10.0  8.4 - 10.5 mg/dL Final   Hgb A1c MFr Bld 07/11/2020 5.4  4.6 - 6.5 % Final   Glycemic Control Guidelines for People with Diabetes:Non Diabetic:  <6%Goal of Therapy: <7%Additional Action Suggested:  >8%  Physical Examination:  BP 128/84    Pulse 69    Ht 5' 11" (1.803 m)    Wt 219 lb 6.4 oz (99.5 kg)    SpO2 97%    BMI 30.60 kg/m     ASSESSMENT:  Diabetes type 2, non-insulin-dependent  See history of present illness for detailed discussion of current diabetes management, blood sugar patterns and problems identified     A1c is still excellent at 5.4  He is on 3 drug regimen including Farxiga and Ozempic 1 mg weekly  His blood sugars are excellent and near normal, average in the morning also fairly good at 105 His weight loss has slowed down but he has apparently lost over 100 pounds since his retirement  HYPERTENSION: Well-controlled with losartan 50 mg and also monitored at home Microalbumin normal  Continue following up with PCP and cardiologist  No symptoms of neuropathy    PLAN:     Stop glimepiride completely May stop morning Metformin for now as long as blood sugars stay consistent Also since he has regular nausea symptoms in the morning he can reduce the Ozempic to 0.5 mg weekly, with his current supply of the 1 mg he can dial 38 clicks, showed him how this is done on the pen He will try to be consistent with some form of exercise Regular monitoring of blood pressure at home   Follow-up in 4 months   Patient Instructions  38  Clicks ozempic weekly  Check blood sugars on waking up 3-4 days a week  Also check blood sugars about 2 hours after meals and do this after different meals by rotation  Recommended blood sugar levels on waking up are 90-120 and about 2 hours after meal is 130-160  Please bring your blood sugar monitor to each visit, thank you  Stop am Metformin   Elayne Snare 07/15/2020, 10:31 AM   Note: This office note was prepared with Dragon voice recognition system technology. Any transcriptional errors that result from this process are unintentional.

## 2020-07-15 ENCOUNTER — Other Ambulatory Visit: Payer: Self-pay

## 2020-07-15 ENCOUNTER — Encounter: Payer: Self-pay | Admitting: Endocrinology

## 2020-07-15 ENCOUNTER — Ambulatory Visit (INDEPENDENT_AMBULATORY_CARE_PROVIDER_SITE_OTHER): Payer: Medicare Other | Admitting: Endocrinology

## 2020-07-15 VITALS — BP 128/84 | HR 69 | Ht 71.0 in | Wt 219.4 lb

## 2020-07-15 DIAGNOSIS — E1169 Type 2 diabetes mellitus with other specified complication: Secondary | ICD-10-CM

## 2020-07-15 DIAGNOSIS — E669 Obesity, unspecified: Secondary | ICD-10-CM | POA: Diagnosis not present

## 2020-07-15 NOTE — Patient Instructions (Signed)
38  Clicks ozempic weekly  Check blood sugars on waking up 3-4 days a week  Also check blood sugars about 2 hours after meals and do this after different meals by rotation  Recommended blood sugar levels on waking up are 90-120 and about 2 hours after meal is 130-160  Please bring your blood sugar monitor to each visit, thank you  Stop am Metformin

## 2020-07-16 ENCOUNTER — Ambulatory Visit: Payer: Medicare Other | Admitting: Family Medicine

## 2020-07-25 ENCOUNTER — Encounter: Payer: Self-pay | Admitting: Internal Medicine

## 2020-07-25 ENCOUNTER — Other Ambulatory Visit: Payer: Self-pay

## 2020-07-25 ENCOUNTER — Ambulatory Visit (INDEPENDENT_AMBULATORY_CARE_PROVIDER_SITE_OTHER): Payer: Medicare Other | Admitting: Internal Medicine

## 2020-07-25 VITALS — BP 130/70 | HR 75 | Ht 71.0 in | Wt 218.8 lb

## 2020-07-25 DIAGNOSIS — I350 Nonrheumatic aortic (valve) stenosis: Secondary | ICD-10-CM | POA: Diagnosis not present

## 2020-07-25 DIAGNOSIS — E785 Hyperlipidemia, unspecified: Secondary | ICD-10-CM

## 2020-07-25 DIAGNOSIS — I10 Essential (primary) hypertension: Secondary | ICD-10-CM

## 2020-07-25 DIAGNOSIS — E1169 Type 2 diabetes mellitus with other specified complication: Secondary | ICD-10-CM

## 2020-07-25 NOTE — Progress Notes (Signed)
Cardiology Office Note:    Date:  07/25/2020   ID:  Evan [name], DOB November 15, 1955, MRN [id number]  PCP:  Ria Bush, MD  Cardiologist:  No primary care provider on file.  Electrophysiologist:  None   Referring MD: Ria Bush, MD   Chief Complaint/Reason for Referral: Severe aortic valve stenosis.  History of Present Illness:    Evan [name] is a 65 y.o. male with a history of depression, hyperlipidemia, hypertension, diabetes well controlled, nonalcoholic fatty liver disease, who presents today for evaluation of systolic murmur found to have aortic valve stenosis, now felt to be severe by most recent echo.   He notes more of an ache in his chest, but remains active, walking 5 miles a day. The longer he walks, the less his chest hurts. We discussed that these are reassuring findings in setting of AS. Occasional lightheadedness.  We had previously discussed first deg family screening in [place]. We discussed in great detail today the natural history and management of aortic valve stenosis. Discussed interventional and operative management, expected recovery times, and sequela of each option.   Past Medical History:  Diagnosis Date  . COVID-19 virus infection 05/2020  . Depression    intolerant of cymbalta and wellbutrin  . Diverticulosis of colon   . Esophagitis 03/2015   by EGD  . Gastritis 03/2015   by EGD - mild, chronic  . Hyperlipidemia   . Hypertension   . Lichen simplex chronicus 05/2015   R scrotum [name])  . Murmur    aortic sclerosis, SBE  prophlaxis not needed as per cardio  . NAFLD (nonalcoholic fatty liver disease) 01/2015   by [place]  . 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 T2DM (type 2 diabetes mellitus) ([place])    DSME 09/2013    Past Surgical History:  Procedure Laterality Date  . COLONOSCOPY  2006   diverticulosis per pt  . COLONOSCOPY  03/2015   2 polyps, rpt 5 yrs [name])  . ESOPHAGOGASTRODUODENOSCOPY  03/2015   mild  chronic gastritis, esophagitis   . LUMBAR LAMINECTOMY  1989   L5-S1    Current Medications: Current Meds  Medication Sig  . aspirin 81 MG tablet Take 81 mg by mouth daily.  . Blood Glucose Monitoring Suppl (ONETOUCH VERIO FLEX SYSTEM) w/Device KIT 1 kit by Does not apply route daily. Use to check blood sugars 2 times daily  . Cholecalciferol (VITAMIN D3) 1000 units CAPS Take 1 capsule (1,000 Units total) by mouth daily.  . Cyanocobalamin (VITAMIN B12) 3000 MCG SUBL Place 3,000 mcg under the tongue.  Marland Kitchen FARXIGA 10 MG TABS tablet TAKE 1 TABLET (10 MG TOTAL) BY MOUTH EVERY MORNING. TAKE 1 TABLET BY MOUTH EVERY MORNING.  Marland Kitchen ibuprofen (ADVIL,MOTRIN) 200 MG tablet Take 200 mg by mouth at bedtime as needed.  Marland Kitchen losartan (COZAAR) 100 MG tablet Take 1 tablet (100 mg total) by mouth daily.  . metFORMIN (GLUCOPHAGE) 1000 MG tablet TAKE 1 TABLET (1,000 MG TOTAL) BY MOUTH 2 TIMES DAILY WITH A MEAL. (Patient taking differently: daily with breakfast.)  . omeprazole (PRILOSEC) 20 MG capsule Take 1 capsule (20 mg total) by mouth daily as needed.  Glory Rosebush VERIO test strip USE TO CHECK BLOOD SUGARS 2 TIMES DAILY  . rosuvastatin (CRESTOR) 5 MG tablet Take 1 tablet (5 mg total) by mouth daily.  . Semaglutide, 1 MG/DOSE, (OZEMPIC, 1 MG/DOSE,) 4 MG/3ML SOPN Inject 1 mg as directed once a week.  . sildenafil (REVATIO)  20 MG tablet Take 2-5 tablets (40-100 mg total) by mouth daily as needed (relations).  . Turmeric 500 MG CAPS Take by mouth.     Allergies:   Penicillins   Social History   Tobacco Use  . Smoking status: Never Smoker  . Smokeless tobacco: Never Used  Substance Use Topics  . Alcohol use: Yes    Alcohol/week: 0.0 standard drinks    Comment: occasional  . Drug use: No     Family History: The patient's family history includes Alcohol abuse in his brother and mother; Alzheimer's disease in his father; CAD in his brother; Coronary artery disease in his maternal uncle; Diabetes in his brother  and maternal grandfather; Emphysema in his mother; Hyperlipidemia in his father; Hypertension in his father; Prostate cancer in his brother; Schizophrenia in his paternal uncle; Stroke in his father, maternal grandfather, maternal grandmother, paternal grandfather, and paternal grandmother.  ROS:   Please see the history of present illness.    All other systems reviewed and are negative.  EKGs/Labs/Other Studies Reviewed:    The following studies were reviewed today:  EKG:  NSR  Recent Labs: 07/11/2020: ALT 18; BUN 18; Creatinine, Ser 0.97; Potassium 4.4; Sodium 137  Recent Lipid Panel    Component Value Date/Time   CHOL 165 03/11/2020 0907   TRIG 59.0 03/11/2020 0907   HDL 82.50 03/11/2020 0907   CHOLHDL 2 03/11/2020 0907   VLDL 11.8 03/11/2020 0907   LDLCALC 71 03/11/2020 0907   LDLDIRECT 99.0 05/20/2015 0955    Physical Exam:    VS:  BP 130/70   Pulse 75   Ht '5\' 11"'  (1.803 m)   Wt 218 lb 12.8 oz (99.2 kg)   BMI 30.52 kg/m     Wt Readings from Last 5 Encounters:  07/25/20 218 lb 12.8 oz (99.2 kg)  07/15/20 219 lb 6.4 oz (99.5 kg)  03/13/20 222 lb (100.7 kg)  01/29/20 231 lb (104.8 kg)  12/25/19 234 lb 4 oz (106.3 kg)    Constitutional: No acute distress Eyes: sclera non-icteric, normal conjunctiva and lids ENMT: normal dentition, moist mucous membranes Cardiovascular: regular rhythm, normal rate, 3/6 SEM, late peaking, S2 muffled. Radial pulses normal bilaterally. No jugular venous distention.  Respiratory: clear to auscultation bilaterally GI : normal bowel sounds, soft and nontender. No distention.   MSK: extremities warm, well perfused. No edema.  NEURO: grossly nonfocal exam, moves all extremities. PSYCH: alert and oriented x 3, normal mood and affect.   ASSESSMENT:    1. Severe aortic valve stenosis   2. Essential hypertension   3. Hyperlipidemia associated with type 2 diabetes mellitus ([place])    PLAN:    Severe aortic valve stenosis - Plan: Ambulatory  referral to Structural Heart/Valve Clinic  Given progression to severe aortic valve stenosis with mean gradient 41 mmHg, and some symptoms of atypical chest discomfort, would like to have him seen by the structural heart clinic sooner rather than later.  Probable bicuspid aortic valve stenosis.  This will be better seen on CT when TAVR scans are performed in preprocedural planning.  Discussed in great detail the work-up involved in severe aortic stenosis.  I answered all of his and his wife's questions to the best my ability and anticipate these will be answered even further at structural heart consultation. -Can continue aspirin and statin at this time  Essential hypertension -  Hyperlipidemia associated with type 2 diabetes mellitus ([place])  -Blood pressure is overall stable, would continue losartan 100 mg  daily -Continue Crestor 5 mg daily.   Total time of encounter: 45 minutes total time of encounter, including 30 minutes spent in face-to-face patient care on the date of this encounter. This time includes coordination of care and counseling regarding above mentioned problem list. Remainder of non-face-to-face time involved reviewing chart documents/testing relevant to the patient encounter and documentation in the medical record. I have independently reviewed documentation from referring provider.   Cherlynn Kaiser, MD [place]  [facility]    Medication Adjustments/Labs and Tests Ordered: Current medicines are reviewed at length with the patient today.  Concerns regarding medicines are outlined above.   Orders Placed This Encounter  Procedures  . Ambulatory referral to Structural Heart/Valve Clinic (only at [place])  . EKG 12-Lead    No orders of the defined types were placed in this encounter.   Patient Instructions  Medication Instructions:  Your physician recommends that you continue on your current medications as directed. Please refer to the Current Medication list  given to you today.  *If you need a refill on your cardiac medications before your next appointment, please call your pharmacy*   Follow-Up: At [facility], you and your health needs are our priority.  As part of our continuing mission to provide you with exceptional heart care, we have created designated Provider Care Teams.  These Care Teams include your primary Cardiologist (physician) and Advanced Practice Providers (APPs -  Physician Assistants and Nurse Practitioners) who all work together to provide you with the care you need, when you need it.  We recommend signing up for the patient portal called "MyChart".  Sign up information is provided on this After Visit Summary.  MyChart is used to connect with patients for Virtual Visits (Telemedicine).  Patients are able to view lab/test results, encounter notes, upcoming appointments, etc.  Non-urgent messages can be sent to your provider as well.   To learn more about what you can do with MyChart, go to [email or web address].    Your next appointment:   3 month(s)  The format for your next appointment:   In Person  Provider:   Cherlynn Kaiser, MD   Other Instructions Structural Heart referral made for TAVR evaluation.

## 2020-07-25 NOTE — Patient Instructions (Signed)
Medication Instructions:  Your physician recommends that you continue on your current medications as directed. Please refer to the Current Medication list given to you today.  *If you need a refill on your cardiac medications before your next appointment, please call your pharmacy*   Follow-Up: At [facility], you and your health needs are our priority.  As part of our continuing mission to provide you with exceptional heart care, we have created designated Provider Care Teams.  These Care Teams include your primary Cardiologist (physician) and Advanced Practice Providers (APPs -  Physician Assistants and Nurse Practitioners) who all work together to provide you with the care you need, when you need it.  We recommend signing up for the patient portal called "MyChart".  Sign up information is provided on this After Visit Summary.  MyChart is used to connect with patients for Virtual Visits (Telemedicine).  Patients are able to view lab/test results, encounter notes, upcoming appointments, etc.  Non-urgent messages can be sent to your provider as well.   To learn more about what you can do with MyChart, go to [email or web address].    Your next appointment:   3 month(s)  The format for your next appointment:   In Person  Provider:   Cherlynn Kaiser, MD   Other Instructions Structural Heart referral made for TAVR evaluation.

## 2020-08-01 DIAGNOSIS — N5201 Erectile dysfunction due to arterial insufficiency: Secondary | ICD-10-CM | POA: Diagnosis not present

## 2020-08-01 DIAGNOSIS — R972 Elevated prostate specific antigen [PSA]: Secondary | ICD-10-CM | POA: Diagnosis not present

## 2020-08-01 NOTE — Progress Notes (Signed)
[facility] Consult Note  Chief Complaint  Patient presents with  . New Patient (Initial Visit)    Aortic stenosis   History of Present Illness: 65 yo male with history of depression, GERD, HTN, hyperlipidemia, fatty liver disease, diabetes mellitus and bicuspid aortic valve with severe aortic stenosis who is here today as new consult, referred by Dr. Margaretann Loveless, for further discussion regarding his aortic stenosis and AVR vs TAVR. He has been followed for moderate aortic stenosis. Echo 07/11/20 with LVEF=55-60%, severe LVH. The aortic valve appears to be bicuspid. There is severe calcification of the aortic valve leaflets. Mean gradient 41 mmHg, peak gradient 59 mmHg. AVA 1.24 cm2, dimensionless index 0.35. His PSA has been elevated. He is having a biopsy soon.   He tells me today that he has had resting chest pressure and pain. This seems to improve when he walks. No exertional chest pain. He has felt his heart fluttering and this lasts for a few seconds. He has no dyspnea or fatigue. No lower extremity edema. No dizziness, near syncope or syncope. He is very active and has not seen any change in his exercise tolerance. He lives in [place] with his wife. He sees a dentist regularly and has a cavity that needs to be repaired. He is retired from [employer].    Primary Care Physician: Ria Bush, MD Primary Cardiologist: Margaretann Loveless Referring Cardiologist: Margaretann Loveless  Past Medical History:  Diagnosis Date  . Aortic stenosis   . COVID-19 virus infection 05/2020  . Depression    intolerant of cymbalta and wellbutrin  . Diverticulosis of colon   . Esophagitis 03/2015   by EGD  . Gastritis 03/2015   by EGD - mild, chronic  . Hyperlipidemia   . Hypertension   . Lichen simplex chronicus 05/2015   R scrotum [name])  . Murmur    aortic sclerosis, SBE  prophlaxis not needed as per cardio  . NAFLD (nonalcoholic fatty liver disease) 01/2015   by [place]  .  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 T2DM (type 2 diabetes mellitus) ([place])    DSME 09/2013    Past Surgical History:  Procedure Laterality Date  . COLONOSCOPY  2006   diverticulosis per pt  . COLONOSCOPY  03/2015   2 polyps, rpt 5 yrs [name])  . ESOPHAGOGASTRODUODENOSCOPY  03/2015   mild chronic gastritis, esophagitis   . LUMBAR LAMINECTOMY  1989   L5-S1    Current Outpatient Medications  Medication Sig Dispense Refill  . aspirin 81 MG tablet Take 81 mg by mouth daily.    . Blood Glucose Monitoring Suppl (ONETOUCH VERIO FLEX SYSTEM) w/Device KIT 1 kit by Does not apply route daily. Use to check blood sugars 2 times daily 1 kit 2  . Cholecalciferol (VITAMIN D3) 1000 units CAPS Take 1 capsule (1,000 Units total) by mouth daily. 30 capsule   . Cyanocobalamin (VITAMIN B12) 3000 MCG SUBL Place 3,000 mcg under the tongue.    Marland Kitchen FARXIGA 10 MG TABS tablet TAKE 1 TABLET (10 MG TOTAL) BY MOUTH EVERY MORNING. TAKE 1 TABLET BY MOUTH EVERY MORNING. 30 tablet 2  . ibuprofen (ADVIL,MOTRIN) 200 MG tablet Take 200 mg by mouth at bedtime as needed.    Marland Kitchen losartan (COZAAR) 100 MG tablet Take 1 tablet (100 mg total) by mouth daily. 90 tablet 3  . metFORMIN (GLUCOPHAGE) 1000 MG tablet TAKE 1 TABLET (1,000 MG TOTAL) BY MOUTH 2 TIMES DAILY WITH A MEAL. (  Patient taking differently: daily with breakfast.) 180 tablet 1  . omeprazole (PRILOSEC) 20 MG capsule Take 1 capsule (20 mg total) by mouth daily as needed.    Glory Rosebush VERIO test strip USE TO CHECK BLOOD SUGARS 2 TIMES DAILY 200 strip 3  . rosuvastatin (CRESTOR) 5 MG tablet Take 1 tablet (5 mg total) by mouth daily. 90 tablet 3  . Semaglutide, 1 MG/DOSE, (OZEMPIC, 1 MG/DOSE,) 4 MG/3ML SOPN Inject 1 mg as directed once a week. 9 mL 1  . sildenafil (REVATIO) 20 MG tablet Take 2-5 tablets (40-100 mg total) by mouth daily as needed (relations). 30 tablet 3  . Turmeric 500 MG CAPS Take by mouth.     No current facility-administered  medications for this visit.    Allergies  Allergen Reactions  . Penicillins     Respiratory distress as infant    Social History   Socioeconomic History  . Marital status: Married    Spouse name: Not on file  . Number of children: 2  . Years of education: Not on file  . Highest education level: Not on file  Occupational History  . Occupation: Retired-Worked for the [employer]  . Smoking status: Never Smoker  . Smokeless tobacco: Never Used  Substance and Sexual Activity  . Alcohol use: Yes    Alcohol/week: 0.0 standard drinks    Comment: occasional  . Drug use: No  . Sexual activity: Not on file  Other Topics Concern  . 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  Keeps record of caloric intake   Social Determinants of Health   Financial Resource Strain: Not on file  Food Insecurity: Not on file  Transportation Needs: Not on file  Physical Activity: Not on file  Stress: Not on file  Social Connections: Not on file  Intimate Partner Violence: Not on file    Family History  Problem Relation Age of Onset  . Alzheimer's disease Father   . Hypertension Father   . Stroke Father   . Hyperlipidemia Father   . Prostate cancer Brother   . Diabetes Brother   . Alcohol abuse Brother   . CAD Brother   . Coronary artery disease Maternal Uncle   . Schizophrenia Paternal Uncle   . Emphysema Mother   . Alcohol abuse Mother   . Stroke Maternal Grandmother   . Stroke Maternal Grandfather   . Diabetes Maternal Grandfather   . Stroke Paternal Grandmother   . Stroke Paternal Grandfather     Review of Systems:  As stated in the HPI and otherwise negative.   BP 126/72   Pulse 83   Ht '5\' 11"'  (1.803 m)   Wt 225 lb  3.2 oz (102.2 kg)   SpO2 96%   BMI 31.41 kg/m   Physical Examination: General: Well developed, well nourished, NAD  HEENT: OP clear, mucus membranes moist  SKIN: warm, dry. No rashes. Neuro: No focal deficits  Musculoskeletal: Muscle strength 5/5 all ext  Psychiatric: Mood and affect normal  Neck: No JVD, no carotid bruits, no thyromegaly, no lymphadenopathy.  Lungs:Clear bilaterally, no wheezes, rhonci, crackles Cardiovascular: Regular rate and rhythm. Loud, harsh, late peaking systolic murmur.  Abdomen:Soft. Bowel sounds present. Non-tender.  Extremities:  No lower extremity edema. Pulses are 2 + in the bilateral DP/PT.  EKG:  EKG is not ordered today. The ekg from 07/25/20 is personally reviewed and shows NSR  Echo 07/11/20: 1. Aortic valve is bicuspid or functionally bicuspid. Aortic stenosis is  severe with peak/mean transaortic gradients 68/41 mmHg.  2. Left ventricular ejection fraction, by estimation, is 55 to 60%. The  left ventricle has normal function. The left ventricle has no regional  wall motion abnormalities. There is severe concentric left ventricular  hypertrophy. Left ventricular diastolic  parameters are consistent with Grade I diastolic dysfunction (impaired  relaxation). Elevated left atrial pressure.  3. Right ventricular systolic function is normal. The right ventricular  size is normal. There is normal pulmonary artery systolic pressure. The  estimated right ventricular systolic pressure is 12.2 mmHg.  4. Left atrial size was mildly dilated.  5. The mitral valve is normal in structure. Trivial mitral valve  regurgitation. No evidence of mitral stenosis.  6. The aortic valve is bicuspid. There is severe calcifcation of the  aortic valve. There is severe thickening of the aortic valve. Aortic valve  regurgitation is mild. Severe aortic valve stenosis. Aortic valve mean  gradient measures 41.0 mmHg.  7. The inferior vena cava is normal in size with  greater than 50%  respiratory variability, suggesting right atrial pressure of 3 mmHg.   FINDINGS  Left Ventricle: Left ventricular ejection fraction, by estimation, is 55  to 60%. The left ventricle has normal function. The left ventricle has no  regional wall motion abnormalities. The left ventricular internal cavity  size was normal in size. There is  severe concentric left ventricular hypertrophy. Left ventricular  diastolic parameters are consistent with Grade I diastolic dysfunction  (impaired relaxation). Elevated left atrial pressure.   Right Ventricle: The right ventricular size is normal. No increase in  right ventricular wall thickness. Right ventricular systolic function is  normal. There is normal pulmonary artery systolic pressure. The tricuspid  regurgitant velocity is 2.44 m/s, and  with an assumed right atrial pressure of 3 mmHg, the estimated right  ventricular systolic pressure is 48.2 mmHg.   Left Atrium: Left atrial size was mildly dilated.   Right Atrium: Right atrial size was normal in size.   Pericardium: There is no evidence of pericardial effusion.   Mitral Valve: The mitral valve is normal in structure. Trivial mitral  valve regurgitation. No evidence of mitral valve stenosis.   Tricuspid Valve: The tricuspid valve is normal in structure. Tricuspid  valve regurgitation is mild . No evidence of tricuspid stenosis.   Aortic Valve: The aortic valve is bicuspid. There is severe calcifcation  of the aortic valve. There is severe thickening of the aortic valve.  Aortic valve regurgitation is mild. Severe aortic stenosis is present.  Aortic valve mean gradient measures 41.0  mmHg. Aortic valve peak gradient measures 59.3 mmHg. Aortic valve area, by  VTI measures 1.38 cm.   Pulmonic Valve: The pulmonic valve was normal in structure. Pulmonic valve  regurgitation is not visualized. No evidence of pulmonic stenosis.   Aorta: The aortic root is normal in  size and structure.   Venous: The inferior vena cava is normal in size with greater than 50%  respiratory variability, suggesting right atrial pressure of 3 mmHg.   IAS/Shunts: No atrial level shunt detected by color flow Doppler.     LEFT VENTRICLE  PLAX 2D  LVIDd:     3.50 cm Diastology  LVIDs:  2.10 cm LV e' medial:  6.31 cm/s  LV PW:     1.60 cm LV E/e' medial: 13.7  LV IVS:    1.50 cm LV e' lateral:  6.66 cm/s  LVOT diam:   2.25 cm LV E/e' lateral: 13.0  LV SV:     117  LV SV Index:  53  LVOT Area:   3.98 cm     RIGHT VENTRICLE  RV Basal diam: 3.00 cm  RV S prime:   10.60 cm/s  TAPSE (M-mode): 2.4 cm  RVSP:      26.8 mmHg   LEFT ATRIUM       Index    RIGHT ATRIUM      Index  LA diam:    3.40 cm 1.54 cm/m RA Pressure: 3.00 mmHg  LA Vol (A2C):  65.4 ml 29.67 ml/m RA Area:   11.90 cm  LA Vol (A4C):  40.8 ml 18.51 ml/m RA Volume:  27.00 ml 12.25 ml/m  LA Biplane Vol: 53.6 ml 24.32 ml/m  AORTIC VALVE  AV Area (Vmax):  1.34 cm  AV Area (Vmean):  1.24 cm  AV Area (VTI):   1.38 cm  AV Vmax:      385.00 cm/s  AV Vmean:     279.000 cm/s  AV VTI:      0.847 m  AV Peak Grad:   59.3 mmHg  AV Mean Grad:   41.0 mmHg  LVOT Vmax:     130.00 cm/s  LVOT Vmean:    87.300 cm/s  LVOT VTI:     0.295 m  LVOT/AV VTI ratio: 0.35    AORTA  Ao Root diam: 3.80 cm   MITRAL VALVE        TRICUSPID VALVE  MV Area (PHT): 3.31 cm   TR Peak grad:  23.8 mmHg  MV Decel Time: 229 msec   TR Vmax:    244.00 cm/s  MV E velocity: 86.50 cm/s  Estimated RAP: 3.00 mmHg  MV A velocity: 105.00 cm/s RVSP:      26.8 mmHg  MV E/A ratio: 0.82               SHUNTS               Systemic VTI: 0.30 m               Systemic Diam: 2.25 cm   Recent Labs: 07/11/2020: ALT 18; BUN 18; Creatinine, Ser 0.97; Potassium 4.4;  Sodium 137    Wt Readings from Last 3 Encounters:  08/02/20 225 lb 3.2 oz (102.2 kg)  07/25/20 218 lb 12.8 oz (99.2 kg)  07/15/20 219 lb 6.4 oz (99.5 kg)     Other studies Reviewed: Additional studies/ records that were reviewed today include: EKG, echo images, office notes Review of the above records demonstrates: severe AS   STS Risk Score: Risk of Mortality: 0.982% Renal Failure: 1.262% Permanent Stroke: 1.056% Prolonged Ventilation: 4.176% DSW Infection: 0.144% Reoperation: 2.772% Morbidity or Mortality: 7.401% Short Length of Stay: 56.404% Long Length of Stay: 2.970%    Assessment and Plan:   1. Severe Aortic Valve Stenosis: He has a bicuspid aortic valve with severe stenosis. The valve does seem to open but based on the mean gradient he is in the severe range. He is overall asymptomatic at this time but he does have resting chest pain. I think we should start with a cardiac cath to exclude CAD. We will arrange a right and left heart cath  at [facility] 08/15[facility].  I have reviewed the risks, indications, and alternatives to cardiac catheterization, possible angioplasty, and stenting with the patient. Risks include but are not limited to bleeding, infection, vascular injury, stroke, myocardial infection, arrhythmia, kidney injury, radiation-related injury in the case of prolonged fluoroscopy use, emergency cardiac surgery, and death. The patient understands the risks of serious complication is 1-2 in 2952 with diagnostic cardiac cath and 1-2% or less with angioplasty/stenting.  After his cath, we will proceed with the gated cardiac CT and CTA of the chest/abdomen and pelvis to fully evaluate his aortic valve and aorta. He would be a candidate for surgical AVR or TAVR given his young age and overall lack of significant comorbidities.   After his testing, we will review the findings with the multi-disciplinary valve team. If there are no concerning findings on his cardiac cath or CT  scans, we may be able to delay AVR given his lack of symptoms.     I have reviewed the natural history of aortic stenosis with the patient and their family members  who are present today. We have discussed the limitations of medical therapy and the poor prognosis associated with symptomatic aortic stenosis. We have reviewed potential treatment options, including palliative medical therapy, conventional surgical aortic valve replacement, and transcatheter aortic valve replacement. We discussed treatment options in the context of the patient's specific comorbid medical conditions.      Current medicines are reviewed at length with the patient today.  The patient does not have concerns regarding medicines.  The following changes have been made:  no change  Labs/ tests ordered today include:   Orders Placed This Encounter  Procedures  . CT CORONARY MORPH W/CTA COR W/SCORE W/CA W/CM &/OR WO/CM  . CT ANGIO ABDOMEN PELVIS  W &/OR WO CONTRAST  . CT ANGIO CHEST AORTA W/CM & OR WO/CM  . Basic metabolic panel  . CBC with Differential/Platelet   Disposition:   F/u with me in 6 weeks.   Signed, Lauree Chandler, MD 08/02/2020 10:37 AM    [facility] [place], [place], [place]  [address] Phone: [phone]; Fax: [phone]

## 2020-08-01 NOTE — H&P (View-Only) (Signed)
[facility] Consult Note  Chief Complaint  Patient presents with  . New Patient (Initial Visit)    Aortic stenosis   History of Present Illness: 65 yo male with history of depression, GERD, HTN, hyperlipidemia, fatty liver disease, diabetes mellitus and bicuspid aortic valve with severe aortic stenosis who is here today as new consult, referred by Dr. Margaretann Loveless, for further discussion regarding his aortic stenosis and AVR vs TAVR. He has been followed for moderate aortic stenosis. Echo 07/11/20 with LVEF=55-60%, severe LVH. The aortic valve appears to be bicuspid. There is severe calcification of the aortic valve leaflets. Mean gradient 41 mmHg, peak gradient 59 mmHg. AVA 1.24 cm2, dimensionless index 0.35. His PSA has been elevated. He is having a biopsy soon.   He tells me today that he has had resting chest pressure and pain. This seems to improve when he walks. No exertional chest pain. He has felt his heart fluttering and this lasts for a few seconds. He has no dyspnea or fatigue. No lower extremity edema. No dizziness, near syncope or syncope. He is very active and has not seen any change in his exercise tolerance. He lives in [place] with his wife. He sees a dentist regularly and has a cavity that needs to be repaired. He is retired from [employer].    Primary Care Physician: Ria Bush, MD Primary Cardiologist: Margaretann Loveless Referring Cardiologist: Margaretann Loveless  Past Medical History:  Diagnosis Date  . Aortic stenosis   . COVID-19 virus infection 05/2020  . Depression    intolerant of cymbalta and wellbutrin  . Diverticulosis of colon   . Esophagitis 03/2015   by EGD  . Gastritis 03/2015   by EGD - mild, chronic  . Hyperlipidemia   . Hypertension   . Lichen simplex chronicus 05/2015   R scrotum [name])  . Murmur    aortic sclerosis, SBE  prophlaxis not needed as per cardio  . NAFLD (nonalcoholic fatty liver disease) 01/2015   by [place]  .  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 T2DM (type 2 diabetes mellitus) ([facility])    DSME 09/2013    Past Surgical History:  Procedure Laterality Date  . COLONOSCOPY  2006   diverticulosis per pt  . COLONOSCOPY  03/2015   2 polyps, rpt 5 yrs [name])  . ESOPHAGOGASTRODUODENOSCOPY  03/2015   mild chronic gastritis, esophagitis   . LUMBAR LAMINECTOMY  1989   L5-S1    Current Outpatient Medications  Medication Sig Dispense Refill  . aspirin 81 MG tablet Take 81 mg by mouth daily.    . Blood Glucose Monitoring Suppl (ONETOUCH VERIO FLEX SYSTEM) w/Device KIT 1 kit by Does not apply route daily. Use to check blood sugars 2 times daily 1 kit 2  . Cholecalciferol (VITAMIN D3) 1000 units CAPS Take 1 capsule (1,000 Units total) by mouth daily. 30 capsule   . Cyanocobalamin (VITAMIN B12) 3000 MCG SUBL Place 3,000 mcg under the tongue.    Marland Kitchen FARXIGA 10 MG TABS tablet TAKE 1 TABLET (10 MG TOTAL) BY MOUTH EVERY MORNING. TAKE 1 TABLET BY MOUTH EVERY MORNING. 30 tablet 2  . ibuprofen (ADVIL,MOTRIN) 200 MG tablet Take 200 mg by mouth at bedtime as needed.    Marland Kitchen losartan (COZAAR) 100 MG tablet Take 1 tablet (100 mg total) by mouth daily. 90 tablet 3  . metFORMIN (GLUCOPHAGE) 1000 MG tablet TAKE 1 TABLET (1,000 MG TOTAL) BY MOUTH 2 TIMES DAILY WITH A MEAL. (  Patient taking differently: daily with breakfast.) 180 tablet 1  . omeprazole (PRILOSEC) 20 MG capsule Take 1 capsule (20 mg total) by mouth daily as needed.    Glory Rosebush VERIO test strip USE TO CHECK BLOOD SUGARS 2 TIMES DAILY 200 strip 3  . rosuvastatin (CRESTOR) 5 MG tablet Take 1 tablet (5 mg total) by mouth daily. 90 tablet 3  . Semaglutide, 1 MG/DOSE, (OZEMPIC, 1 MG/DOSE,) 4 MG/3ML SOPN Inject 1 mg as directed once a week. 9 mL 1  . sildenafil (REVATIO) 20 MG tablet Take 2-5 tablets (40-100 mg total) by mouth daily as needed (relations). 30 tablet 3  . Turmeric 500 MG CAPS Take by mouth.     No current facility-administered  medications for this visit.    Allergies  Allergen Reactions  . Penicillins     Respiratory distress as infant    Social History   Socioeconomic History  . Marital status: Married    Spouse name: Not on file  . Number of children: 2  . Years of education: Not on file  . Highest education level: Not on file  Occupational History  . Occupation: Retired-Worked for the [employer]  . Smoking status: Never Smoker  . Smokeless tobacco: Never Used  Substance and Sexual Activity  . Alcohol use: Yes    Alcohol/week: 0.0 standard drinks    Comment: occasional  . Drug use: No  . Sexual activity: Not on file  Other Topics Concern  . 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  Keeps record of caloric intake   Social Determinants of Health   Financial Resource Strain: Not on file  Food Insecurity: Not on file  Transportation Needs: Not on file  Physical Activity: Not on file  Stress: Not on file  Social Connections: Not on file  Intimate Partner Violence: Not on file    Family History  Problem Relation Age of Onset  . Alzheimer's disease Father   . Hypertension Father   . Stroke Father   . Hyperlipidemia Father   . Prostate cancer Brother   . Diabetes Brother   . Alcohol abuse Brother   . CAD Brother   . Coronary artery disease Maternal Uncle   . Schizophrenia Paternal Uncle   . Emphysema Mother   . Alcohol abuse Mother   . Stroke Maternal Grandmother   . Stroke Maternal Grandfather   . Diabetes Maternal Grandfather   . Stroke Paternal Grandmother   . Stroke Paternal Grandfather     Review of Systems:  As stated in the HPI and otherwise negative.   BP 126/72   Pulse 83   Ht '5\' 11"'  (1.803 m)   Wt 225 lb  3.2 oz (102.2 kg)   SpO2 96%   BMI 31.41 kg/m   Physical Examination: General: Well developed, well nourished, NAD  HEENT: OP clear, mucus membranes moist  SKIN: warm, dry. No rashes. Neuro: No focal deficits  Musculoskeletal: Muscle strength 5/5 all ext  Psychiatric: Mood and affect normal  Neck: No JVD, no carotid bruits, no thyromegaly, no lymphadenopathy.  Lungs:Clear bilaterally, no wheezes, rhonci, crackles Cardiovascular: Regular rate and rhythm. Loud, harsh, late peaking systolic murmur.  Abdomen:Soft. Bowel sounds present. Non-tender.  Extremities:  No lower extremity edema. Pulses are 2 + in the bilateral DP/PT.  EKG:  EKG is not ordered today. The ekg from 07/25/20 is personally reviewed and shows NSR  Echo 07/11/20: 1. Aortic valve is bicuspid or functionally bicuspid. Aortic stenosis is  severe with peak/mean transaortic gradients 68/41 mmHg.  2. Left ventricular ejection fraction, by estimation, is 55 to 60%. The  left ventricle has normal function. The left ventricle has no regional  wall motion abnormalities. There is severe concentric left ventricular  hypertrophy. Left ventricular diastolic  parameters are consistent with Grade I diastolic dysfunction (impaired  relaxation). Elevated left atrial pressure.  3. Right ventricular systolic function is normal. The right ventricular  size is normal. There is normal pulmonary artery systolic pressure. The  estimated right ventricular systolic pressure is 16.1 mmHg.  4. Left atrial size was mildly dilated.  5. The mitral valve is normal in structure. Trivial mitral valve  regurgitation. No evidence of mitral stenosis.  6. The aortic valve is bicuspid. There is severe calcifcation of the  aortic valve. There is severe thickening of the aortic valve. Aortic valve  regurgitation is mild. Severe aortic valve stenosis. Aortic valve mean  gradient measures 41.0 mmHg.  7. The inferior vena cava is normal in size with  greater than 50%  respiratory variability, suggesting right atrial pressure of 3 mmHg.   FINDINGS  Left Ventricle: Left ventricular ejection fraction, by estimation, is 55  to 60%. The left ventricle has normal function. The left ventricle has no  regional wall motion abnormalities. The left ventricular internal cavity  size was normal in size. There is  severe concentric left ventricular hypertrophy. Left ventricular  diastolic parameters are consistent with Grade I diastolic dysfunction  (impaired relaxation). Elevated left atrial pressure.   Right Ventricle: The right ventricular size is normal. No increase in  right ventricular wall thickness. Right ventricular systolic function is  normal. There is normal pulmonary artery systolic pressure. The tricuspid  regurgitant velocity is 2.44 m/s, and  with an assumed right atrial pressure of 3 mmHg, the estimated right  ventricular systolic pressure is 09.6 mmHg.   Left Atrium: Left atrial size was mildly dilated.   Right Atrium: Right atrial size was normal in size.   Pericardium: There is no evidence of pericardial effusion.   Mitral Valve: The mitral valve is normal in structure. Trivial mitral  valve regurgitation. No evidence of mitral valve stenosis.   Tricuspid Valve: The tricuspid valve is normal in structure. Tricuspid  valve regurgitation is mild . No evidence of tricuspid stenosis.   Aortic Valve: The aortic valve is bicuspid. There is severe calcifcation  of the aortic valve. There is severe thickening of the aortic valve.  Aortic valve regurgitation is mild. Severe aortic stenosis is present.  Aortic valve mean gradient measures 41.0  mmHg. Aortic valve peak gradient measures 59.3 mmHg. Aortic valve area, by  VTI measures 1.38 cm.   Pulmonic Valve: The pulmonic valve was normal in structure. Pulmonic valve  regurgitation is not visualized. No evidence of pulmonic stenosis.   Aorta: The aortic root is normal in  size and structure.   Venous: The inferior vena cava is normal in size with greater than 50%  respiratory variability, suggesting right atrial pressure of 3 mmHg.   IAS/Shunts: No atrial level shunt detected by color flow Doppler.     LEFT VENTRICLE  PLAX 2D  LVIDd:     3.50 cm Diastology  LVIDs:  2.10 cm LV e' medial:  6.31 cm/s  LV PW:     1.60 cm LV E/e' medial: 13.7  LV IVS:    1.50 cm LV e' lateral:  6.66 cm/s  LVOT diam:   2.25 cm LV E/e' lateral: 13.0  LV SV:     117  LV SV Index:  53  LVOT Area:   3.98 cm     RIGHT VENTRICLE  RV Basal diam: 3.00 cm  RV S prime:   10.60 cm/s  TAPSE (M-mode): 2.4 cm  RVSP:      26.8 mmHg   LEFT ATRIUM       Index    RIGHT ATRIUM      Index  LA diam:    3.40 cm 1.54 cm/m RA Pressure: 3.00 mmHg  LA Vol (A2C):  65.4 ml 29.67 ml/m RA Area:   11.90 cm  LA Vol (A4C):  40.8 ml 18.51 ml/m RA Volume:  27.00 ml 12.25 ml/m  LA Biplane Vol: 53.6 ml 24.32 ml/m  AORTIC VALVE  AV Area (Vmax):  1.34 cm  AV Area (Vmean):  1.24 cm  AV Area (VTI):   1.38 cm  AV Vmax:      385.00 cm/s  AV Vmean:     279.000 cm/s  AV VTI:      0.847 m  AV Peak Grad:   59.3 mmHg  AV Mean Grad:   41.0 mmHg  LVOT Vmax:     130.00 cm/s  LVOT Vmean:    87.300 cm/s  LVOT VTI:     0.295 m  LVOT/AV VTI ratio: 0.35    AORTA  Ao Root diam: 3.80 cm   MITRAL VALVE        TRICUSPID VALVE  MV Area (PHT): 3.31 cm   TR Peak grad:  23.8 mmHg  MV Decel Time: 229 msec   TR Vmax:    244.00 cm/s  MV E velocity: 86.50 cm/s  Estimated RAP: 3.00 mmHg  MV A velocity: 105.00 cm/s RVSP:      26.8 mmHg  MV E/A ratio: 0.82               SHUNTS               Systemic VTI: 0.30 m               Systemic Diam: 2.25 cm   Recent Labs: 07/11/2020: ALT 18; BUN 18; Creatinine, Ser 0.97; Potassium 4.4;  Sodium 137    Wt Readings from Last 3 Encounters:  08/02/20 225 lb 3.2 oz (102.2 kg)  07/25/20 218 lb 12.8 oz (99.2 kg)  07/15/20 219 lb 6.4 oz (99.5 kg)     Other studies Reviewed: Additional studies/ records that were reviewed today include: EKG, echo images, office notes Review of the above records demonstrates: severe AS   STS Risk Score: Risk of Mortality: 0.982% Renal Failure: 1.262% Permanent Stroke: 1.056% Prolonged Ventilation: 4.176% DSW Infection: 0.144% Reoperation: 2.772% Morbidity or Mortality: 7.401% Short Length of Stay: 56.404% Long Length of Stay: 2.970%    Assessment and Plan:   1. Severe Aortic Valve Stenosis: He has a bicuspid aortic valve with severe stenosis. The valve does seem to open but based on the mean gradient he is in the severe range. He is overall asymptomatic at this time but he does have resting chest pain. I think we should start with a cardiac cath to exclude CAD. We will arrange a right and left heart cath  at [facility] 08/15/20.  I have reviewed the risks, indications, and alternatives to cardiac catheterization, possible angioplasty, and stenting with the patient. Risks include but are not limited to bleeding, infection, vascular injury, stroke, myocardial infection, arrhythmia, kidney injury, radiation-related injury in the case of prolonged fluoroscopy use, emergency cardiac surgery, and death. The patient understands the risks of serious complication is 1-2 in 8006 with diagnostic cardiac cath and 1-2% or less with angioplasty/stenting.  After his cath, we will proceed with the gated cardiac CT and CTA of the chest/abdomen and pelvis to fully evaluate his aortic valve and aorta. He would be a candidate for surgical AVR or TAVR given his young age and overall lack of significant comorbidities.   After his testing, we will review the findings with the multi-disciplinary valve team. If there are no concerning findings on his cardiac cath or CT  scans, we may be able to delay AVR given his lack of symptoms.     I have reviewed the natural history of aortic stenosis with the patient and their family members  who are present today. We have discussed the limitations of medical therapy and the poor prognosis associated with symptomatic aortic stenosis. We have reviewed potential treatment options, including palliative medical therapy, conventional surgical aortic valve replacement, and transcatheter aortic valve replacement. We discussed treatment options in the context of the patient's specific comorbid medical conditions.      Current medicines are reviewed at length with the patient today.  The patient does not have concerns regarding medicines.  The following changes have been made:  no change  Labs/ tests ordered today include:   Orders Placed This Encounter  Procedures  . CT CORONARY MORPH W/CTA COR W/SCORE W/CA W/CM &/OR WO/CM  . CT ANGIO ABDOMEN PELVIS  W &/OR WO CONTRAST  . CT ANGIO CHEST AORTA W/CM & OR WO/CM  . Basic metabolic panel  . CBC with Differential/Platelet   Disposition:   F/u with me in 6 weeks.   Signed, Lauree Chandler, MD 08/02/2020 10:37 AM    [facility] [place], [place],   [address] Phone: [phone]; Fax: [phone]

## 2020-08-02 ENCOUNTER — Encounter: Payer: Self-pay | Admitting: Cardiovascular Disease

## 2020-08-02 ENCOUNTER — Other Ambulatory Visit: Payer: Self-pay

## 2020-08-02 ENCOUNTER — Ambulatory Visit (INDEPENDENT_AMBULATORY_CARE_PROVIDER_SITE_OTHER): Payer: Medicare Other | Admitting: Cardiovascular Disease

## 2020-08-02 VITALS — BP 126/72 | HR 83 | Ht 71.0 in | Wt 225.2 lb

## 2020-08-02 DIAGNOSIS — I350 Nonrheumatic aortic (valve) stenosis: Secondary | ICD-10-CM

## 2020-08-02 LAB — CBC WITH DIFFERENTIAL/PLATELET
Basophils Absolute: 0.1 10*3/uL (ref 0.0–0.2)
Basos: 1 %
EOS (ABSOLUTE): 0.1 10*3/uL (ref 0.0–0.4)
Eos: 2 %
Hematocrit: 43.3 % (ref 37.5–51.0)
Hemoglobin: 14.5 g/dL (ref 13.0–17.7)
Immature Grans (Abs): 0 10*3/uL (ref 0.0–0.1)
Immature Granulocytes: 1 %
Lymphocytes Absolute: 1.8 10*3/uL (ref 0.7–3.1)
Lymphs: 29 %
MCH: 33.1 pg — ABNORMAL HIGH (ref 26.6–33.0)
MCHC: 33.5 g/dL (ref 31.5–35.7)
MCV: 99 fL — ABNORMAL HIGH (ref 79–97)
Monocytes Absolute: 0.9 10*3/uL (ref 0.1–0.9)
Monocytes: 14 %
Neutrophils Absolute: 3.3 10*3/uL (ref 1.4–7.0)
Neutrophils: 53 %
Platelets: 264 10*3/uL (ref 150–450)
RBC: 4.38 x10E6/uL (ref 4.14–5.80)
RDW: 13.3 % (ref 11.6–15.4)
WBC: 6.2 10*3/uL (ref 3.4–10.8)

## 2020-08-02 LAB — BASIC METABOLIC PANEL
BUN/Creatinine Ratio: 19 (ref 10–24)
BUN: 16 mg/dL (ref 8–27)
CO2: 23 mmol/L (ref 20–29)
Calcium: 9.8 mg/dL (ref 8.6–10.2)
Chloride: 99 mmol/L (ref 96–106)
Creatinine, Ser: 0.83 mg/dL (ref 0.76–1.27)
GFR calc Af Amer: 105 mL/min/{1.73_m2} (ref >59)
GFR calc non Af Amer: 91 mL/min/{1.73_m2} (ref >59)
Glucose: 125 mg/dL — ABNORMAL HIGH (ref 65–99)
Potassium: 4.9 mmol/L (ref 3.5–5.2)
Sodium: 138 mmol/L (ref 134–144)

## 2020-08-02 NOTE — Patient Instructions (Addendum)
COVID SCREENING INFORMATION (3/1): You are scheduled for your drive-thru COVID screening on: 3/1 between 1PM and 2PM. Pre-Procedural COVID-19 Testing Site [address] [place], [place] [address] You will need to go home after your screening and quarantine until your procedure.   CATHETERIZATION INSTRUCTIONS (3/3): You are scheduled for a Cardiac Catheterization on: August 15, 2020 with Dr. Angelena Form.  1. Please arrive at the [facility] (Main Entrance A) at [facility]: [address] [place], [place] [address] at 7:00AM. (This time is two hours before your procedure to ensure your preparation). Free valet parking service is available. You are allowed ONE visitor in the waiting room during your procedure. Both you and your guest must wear masks. Special note: Every effort is made to have your procedure done on time. Please understand that emergencies sometimes delay scheduled procedures.  2. Diet: Do not eat solid foods after midnight.  You may have clear liquids until 5am upon the day of the procedure.  3. Labs: TODAY! BMET, CBC  4. Medication instructions in preparation for your procedure:  1) HOLD METFORMIN the morning of and 48 hours after your cath  2) MAKE SURE TO TAKE YOUR ASPIRIN the AM of your procedure  3) You may take your other medications as directed with sips of water  5. Plan for one night stay--bring personal belongings. 6. Bring a current list of your medications and current insurance cards. 7. You MUST have a responsible person to drive you home. 8. Someone MUST be with you the first 24 hours after you arrive home or your discharge will be delayed. 9. Please wear clothes that are easy to get on and off and wear slip-on shoes.  Thank you for allowing [place] to care for you!   -- [facility]

## 2020-08-13 ENCOUNTER — Other Ambulatory Visit (HOSPITAL_COMMUNITY): Payer: Medicare Other

## 2020-08-13 ENCOUNTER — Telehealth: Payer: Self-pay | Admitting: *Deleted

## 2020-08-13 NOTE — Telephone Encounter (Signed)
Pt contacted pre-catheterization scheduled at [facility] for: Thursday August 15, 2020 9 AM Verified arrival time and place: [facility]) at: 7 AM   No solid food after midnight prior to cath, clear liquids until 5 AM day of procedure.  Hold: Metformin-day of procedure and 48 hours post procedure Farxiga-AM of procedure Sildenafil-until post procedure  Except hold medications AM meds can be  taken pre-cath with sips of water including: ASA 81 mg   Confirmed patient has responsible adult to drive home post procedure and be with patient first 24 hours after arriving home: yes  You are allowed ONE visitor in the waiting room during the time you are at the hospital for your procedure. Both you and your visitor must wear a mask once you enter the hospital.   COVID-19 positive 06/10/20 (see 08/02/20 MyChart message for result),  pt denies any symptoms concerning for COVID-19.

## 2020-08-15 ENCOUNTER — Encounter (HOSPITAL_COMMUNITY)
Admission: RE | Disposition: A | Discharge status: Home / Self Care | Payer: Self-pay | Source: Home / Self Care | Attending: Cardiovascular Disease

## 2020-08-15 ENCOUNTER — Encounter (HOSPITAL_COMMUNITY): Payer: Self-pay | Admitting: Cardiovascular Disease

## 2020-08-15 ENCOUNTER — Ambulatory Visit (HOSPITAL_COMMUNITY)
Admission: RE | Admit: 2020-08-15 | Discharge: 2020-08-15 | Disposition: A | Discharge status: Home / Self Care | Payer: Medicare Other | Attending: Cardiovascular Disease | Admitting: Cardiovascular Disease

## 2020-08-15 ENCOUNTER — Other Ambulatory Visit: Payer: Self-pay

## 2020-08-15 DIAGNOSIS — Z79899 Other long term (current) drug therapy: Secondary | ICD-10-CM | POA: Insufficient documentation

## 2020-08-15 DIAGNOSIS — I2510 Atherosclerotic heart disease of native coronary artery without angina pectoris: Secondary | ICD-10-CM | POA: Insufficient documentation

## 2020-08-15 DIAGNOSIS — Z7984 Long term (current) use of oral hypoglycemic drugs: Secondary | ICD-10-CM | POA: Insufficient documentation

## 2020-08-15 DIAGNOSIS — Z880 Allergy status to penicillin: Secondary | ICD-10-CM | POA: Diagnosis not present

## 2020-08-15 DIAGNOSIS — K5730 Diverticulosis of large intestine without perforation or abscess without bleeding: Secondary | ICD-10-CM | POA: Insufficient documentation

## 2020-08-15 DIAGNOSIS — Z7982 Long term (current) use of aspirin: Secondary | ICD-10-CM | POA: Diagnosis not present

## 2020-08-15 DIAGNOSIS — I350 Nonrheumatic aortic (valve) stenosis: Secondary | ICD-10-CM | POA: Diagnosis not present

## 2020-08-15 DIAGNOSIS — I700 Atherosclerosis of aorta: Secondary | ICD-10-CM | POA: Insufficient documentation

## 2020-08-15 HISTORY — PX: RIGHT/LEFT HEART CATH AND CORONARY ANGIOGRAPHY: CATH118266

## 2020-08-15 LAB — POCT I-STAT 7, (LYTES, BLD GAS, ICA,H+H)
Acid-Base Excess: 0 mmol/L (ref 0.0–2.0)
Bicarbonate: 25.7 mmol/L (ref 20.0–28.0)
Calcium, Ion: 1.16 mmol/L (ref 1.15–1.40)
HCT: 43 % (ref 39.0–52.0)
Hemoglobin: 14.6 g/dL (ref 13.0–17.0)
O2 Saturation: 98 %
Potassium: 3.8 mmol/L (ref 3.5–5.1)
Sodium: 140 mmol/L (ref 135–145)
TCO2: 27 mmol/L (ref 22–32)
pCO2 arterial: 46.4 mmHg (ref 32.0–48.0)
pH, Arterial: 7.351 (ref 7.350–7.450)
pO2, Arterial: 109 mmHg — ABNORMAL HIGH (ref 83.0–108.0)

## 2020-08-15 LAB — POCT I-STAT EG7
Acid-Base Excess: 1 mmol/L (ref 0.0–2.0)
Bicarbonate: 27.5 mmol/L (ref 20.0–28.0)
Calcium, Ion: 1.27 mmol/L (ref 1.15–1.40)
HCT: 44 % (ref 39.0–52.0)
Hemoglobin: 15 g/dL (ref 13.0–17.0)
O2 Saturation: 79 %
Potassium: 4 mmol/L (ref 3.5–5.1)
Sodium: 138 mmol/L (ref 135–145)
TCO2: 29 mmol/L (ref 22–32)
pCO2, Ven: 50.7 mmHg (ref 44.0–60.0)
pH, Ven: 7.341 (ref 7.250–7.430)
pO2, Ven: 46 mmHg — ABNORMAL HIGH (ref 32.0–45.0)

## 2020-08-15 LAB — GLUCOSE, CAPILLARY: Glucose-Capillary: 121 mg/dL — ABNORMAL HIGH (ref 70–99)

## 2020-08-15 SURGERY — RIGHT/LEFT HEART CATH AND CORONARY ANGIOGRAPHY
Anesthesia: LOCAL

## 2020-08-15 MED ORDER — MIDAZOLAM HCL 2 MG/2ML IJ SOLN
INTRAMUSCULAR | Status: AC
  Filled 2020-08-15: qty 2

## 2020-08-15 MED ORDER — ONDANSETRON HCL 4 MG/2ML IJ SOLN: 4.0000 mg | Freq: Four times a day (QID) | INTRAMUSCULAR | Status: DC | PRN

## 2020-08-15 MED ORDER — SODIUM CHLORIDE 0.9 % WEIGHT BASED INFUSION: 1.0000 mL/kg/h | INTRAVENOUS | Status: DC

## 2020-08-15 MED ORDER — MIDAZOLAM HCL 2 MG/2ML IJ SOLN
INTRAMUSCULAR | Status: DC | PRN
  Administered 2020-08-15: 2 mg via INTRAVENOUS

## 2020-08-15 MED ORDER — FENTANYL CITRATE (PF) 100 MCG/2ML IJ SOLN
INTRAMUSCULAR | Status: DC | PRN
  Administered 2020-08-15: 50 ug via INTRAVENOUS

## 2020-08-15 MED ORDER — HEPARIN (PORCINE) IN NACL 1000-0.9 UT/500ML-% IV SOLN
INTRAVENOUS | Status: AC
  Filled 2020-08-15: qty 500

## 2020-08-15 MED ORDER — HEPARIN (PORCINE) IN NACL 1000-0.9 UT/500ML-% IV SOLN
INTRAVENOUS | Status: DC | PRN
  Administered 2020-08-15 (×2): 500 mL

## 2020-08-15 MED ORDER — LIDOCAINE HCL (PF) 1 % IJ SOLN
INTRAMUSCULAR | Status: AC
  Filled 2020-08-15: qty 30

## 2020-08-15 MED ORDER — SODIUM CHLORIDE 0.9% FLUSH: 3.0000 mL | INTRAVENOUS | Status: DC | PRN

## 2020-08-15 MED ORDER — VERAPAMIL HCL 2.5 MG/ML IV SOLN: INTRAVENOUS | Status: DC | PRN

## 2020-08-15 MED ORDER — SODIUM CHLORIDE 0.9 % WEIGHT BASED INFUSION
3.0000 mL/kg/h | INTRAVENOUS | Status: AC
  Administered 2020-08-15: 3 mL/kg/h via INTRAVENOUS

## 2020-08-15 MED ORDER — LABETALOL HCL 5 MG/ML IV SOLN: 10.0000 mg | INTRAVENOUS | Status: DC | PRN

## 2020-08-15 MED ORDER — HEPARIN SODIUM (PORCINE) 1000 UNIT/ML IJ SOLN
INTRAMUSCULAR | Status: AC
  Filled 2020-08-15: qty 1

## 2020-08-15 MED ORDER — HEPARIN SODIUM (PORCINE) 1000 UNIT/ML IJ SOLN
INTRAMUSCULAR | Status: DC | PRN
  Administered 2020-08-15: 5000 [IU] via INTRAVENOUS

## 2020-08-15 MED ORDER — FENTANYL CITRATE (PF) 100 MCG/2ML IJ SOLN
INTRAMUSCULAR | Status: AC
  Filled 2020-08-15: qty 2

## 2020-08-15 MED ORDER — HYDRALAZINE HCL 20 MG/ML IJ SOLN
10.0000 mg | INTRAMUSCULAR | Status: DC | PRN
Start: 2020-08-15 — End: 2020-08-15

## 2020-08-15 MED ORDER — SODIUM CHLORIDE 0.9% FLUSH: 3.0000 mL | Freq: Two times a day (BID) | INTRAVENOUS | Status: DC

## 2020-08-15 MED ORDER — SODIUM CHLORIDE 0.9 % IV SOLN: 250.0000 mL | INTRAVENOUS | Status: DC | PRN

## 2020-08-15 MED ORDER — IOHEXOL 350 MG/ML SOLN
INTRAVENOUS | Status: DC | PRN
  Administered 2020-08-15: 45 mL

## 2020-08-15 MED ORDER — LIDOCAINE HCL (PF) 1 % IJ SOLN
INTRAMUSCULAR | Status: DC | PRN
  Administered 2020-08-15 (×2): 2 mL

## 2020-08-15 MED ORDER — SODIUM CHLORIDE 0.9 % IV SOLN: INTRAVENOUS | Status: AC

## 2020-08-15 MED ORDER — ASPIRIN 81 MG PO CHEW: 81.0000 mg | CHEWABLE_TABLET | ORAL | Status: DC

## 2020-08-15 MED ORDER — VERAPAMIL HCL 2.5 MG/ML IV SOLN
INTRAVENOUS | Status: AC
  Filled 2020-08-15: qty 2

## 2020-08-15 MED ORDER — ACETAMINOPHEN 325 MG PO TABS: 650.0000 mg | ORAL_TABLET | ORAL | Status: DC | PRN

## 2020-08-15 SURGICAL SUPPLY — 12 items

## 2020-08-15 NOTE — Discharge Instructions (Signed)
 HOLD METFORMIN FOR A FULL 48 HOURS AFTER DISCHARGE.  Radial Site Care  This sheet gives you information about how to care for yourself after your procedure. Your health care provider may also give you more specific instructions. If you have problems or questions, contact your health care provider. What can I expect after the procedure? After the procedure, it is common to have:  Bruising and tenderness at the catheter insertion area. Follow these instructions at home: Medicines  Take over-the-counter and prescription medicines only as told by your health care provider. Insertion site care 1. Follow instructions from your health care provider about how to take care of your insertion site. Make sure you: ? Wash your hands with soap and water before you remove your bandage (dressing). If soap and water are not available, use hand sanitizer. ? May remove dressing in 24 hours. 2. Check your insertion site every day for signs of infection. Check for: ? Redness, swelling, or pain. ? Fluid or blood. ? Pus or a bad smell. ? Warmth. 3. Do no take baths, swim, or use a hot tub for 5 days. 4. You may shower 24-48 hours after the procedure. ? Remove the dressing and gently wash the site with plain soap and water. ? Pat the area dry with a clean towel. ? Do not rub the site. That could cause bleeding. 5. Do not apply powder or lotion to the site. Activity  1. For 24 hours after the procedure, or as directed by your health care provider: ? Do not flex or bend the affected arm. ? Do not push or pull heavy objects with the affected arm. ? Do not drive yourself home from the hospital or clinic. You may drive 24 hours after the procedure. ? Do not operate machinery or power tools. ? KEEP ARM ELEVATED THE REMAINDER OF THE DAY. 2. Do not push, pull or lift anything that is heavier than 10 lb for 5 days. 3. Ask your health care provider when it is okay to: ? Return to work or school. ? Resume  usual physical activities or sports. ? Resume sexual activity. General instructions  If the catheter site starts to bleed, raise your arm and put firm pressure on the site. If the bleeding does not stop, get help right away. This is a medical emergency.  DRINK PLENTY OF FLUIDS FOR THE NEXT 2-3 DAYS.  No alcohol consumption for 24 hours after receiving sedation.  If you went home on the same day as your procedure, a responsible adult should be with you for the first 24 hours after you arrive home.  Keep all follow-up visits as told by your health care provider. This is important. Contact a health care provider if:  You have a fever.  You have redness, swelling, or yellow drainage around your insertion site. Get help right away if:  You have unusual pain at the radial site.  The catheter insertion area swells very fast.  The insertion area is bleeding, and the bleeding does not stop when you hold steady pressure on the area.  Your arm or hand becomes pale, cool, tingly, or numb. These symptoms may represent a serious problem that is an emergency. Do not wait to see if the symptoms will go away. Get medical help right away. Call your local emergency services (911 in the [place] Do not drive yourself to the hospital. Summary  After the procedure, it is common to have bruising and tenderness at the site.  Follow   instructions from your health care provider about how to take care of your radial site wound. Check the wound every day for signs of infection.  This information is not intended to replace advice given to you by your health care provider. Make sure you discuss any questions you have with your health care provider. Document Revised: 07/07/2017 Document Reviewed: 07/07/2017 Elsevier Patient Education  2020 Elsevier Inc. 

## 2020-08-15 NOTE — Research (Signed)
[place] Informed Consent   Subject Name: Evan [name]  Subject met inclusion and exclusion criteria.  The informed consent form, study requirements and expectations were reviewed with the subject and questions and concerns were addressed prior to the signing of the consent form.  The subject verbalized understanding of the trail requirements.  The subject agreed to participate in the [facility] trial and signed the informed consent.  The informed consent was obtained prior to performance of any protocol-specific procedures for the subject.  A copy of the signed informed consent was given to the subject and a copy was placed in the subject's medical record.  Philemon Kingdom D 08/15/2020, 0740 AM

## 2020-08-15 NOTE — Interval H&P Note (Signed)
History and Physical Interval Note:  08/15/2020 7:32 AM  Evan [name]  has presented today for surgery, with the diagnosis of aortic stenosis.  The various methods of treatment have been discussed with the patient and family. After consideration of risks, benefits and other options for treatment, the patient has consented to  Procedure(s): RIGHT/LEFT HEART CATH AND CORONARY ANGIOGRAPHY (N/A) as a surgical intervention.  The patient's history has been reviewed, patient examined, no change in status, stable for surgery.  I have reviewed the patient's chart and labs.  Questions were answered to the patient's satisfaction.    Cath Lab Visit (complete for each Cath Lab visit)  Clinical Evaluation Leading to the Procedure:   ACS: No.  Non-ACS:    Anginal Classification: CCS I  Anti-ischemic medical therapy: No Therapy  Non-Invasive Test Results: No non-invasive testing performed  Prior CABG: No previous CABG        Lauree Chandler

## 2020-08-16 ENCOUNTER — Other Ambulatory Visit: Payer: Self-pay

## 2020-08-16 MED ORDER — METOPROLOL TARTRATE 50 MG PO TABS: ORAL_TABLET | ORAL | 0 refills | Status: DC

## 2020-08-20 ENCOUNTER — Encounter (HOSPITAL_COMMUNITY): Payer: Self-pay

## 2020-08-20 ENCOUNTER — Other Ambulatory Visit: Payer: Self-pay

## 2020-08-20 ENCOUNTER — Ambulatory Visit (HOSPITAL_COMMUNITY)
Admission: RE | Admit: 2020-08-20 | Discharge: 2020-08-20 | Disposition: A | Discharge status: Home / Self Care | Payer: Medicare Other | Source: Home / Self Care | Attending: Cardiovascular Disease | Admitting: Cardiovascular Disease

## 2020-08-20 ENCOUNTER — Encounter: Payer: Self-pay | Admitting: Family Medicine

## 2020-08-20 ENCOUNTER — Ambulatory Visit (HOSPITAL_COMMUNITY): Admission: RE | Admit: 2020-08-20 | Payer: Medicare Other | Source: Home / Self Care | Admitting: Cardiovascular Disease

## 2020-08-20 DIAGNOSIS — Z79899 Other long term (current) drug therapy: Secondary | ICD-10-CM | POA: Diagnosis not present

## 2020-08-20 DIAGNOSIS — Z880 Allergy status to penicillin: Secondary | ICD-10-CM | POA: Diagnosis not present

## 2020-08-20 DIAGNOSIS — Z7984 Long term (current) use of oral hypoglycemic drugs: Secondary | ICD-10-CM | POA: Diagnosis not present

## 2020-08-20 DIAGNOSIS — I350 Nonrheumatic aortic (valve) stenosis: Secondary | ICD-10-CM | POA: Diagnosis not present

## 2020-08-20 DIAGNOSIS — I708 Atherosclerosis of other arteries: Secondary | ICD-10-CM | POA: Diagnosis not present

## 2020-08-20 DIAGNOSIS — R972 Elevated prostate specific antigen [PSA]: Secondary | ICD-10-CM | POA: Insufficient documentation

## 2020-08-20 DIAGNOSIS — Z01818 Encounter for other preprocedural examination: Secondary | ICD-10-CM | POA: Diagnosis not present

## 2020-08-20 DIAGNOSIS — K5730 Diverticulosis of large intestine without perforation or abscess without bleeding: Secondary | ICD-10-CM | POA: Diagnosis not present

## 2020-08-20 DIAGNOSIS — Z7982 Long term (current) use of aspirin: Secondary | ICD-10-CM | POA: Diagnosis not present

## 2020-08-20 DIAGNOSIS — I700 Atherosclerosis of aorta: Secondary | ICD-10-CM | POA: Diagnosis not present

## 2020-08-20 MED ORDER — IOHEXOL 350 MG/ML SOLN
100.0000 mL | Freq: Once | INTRAVENOUS | Status: AC | PRN
  Administered 2020-08-20: 100 mL via INTRAVENOUS

## 2020-09-16 ENCOUNTER — Encounter: Payer: Self-pay | Admitting: Physician Assistant

## 2020-09-16 ENCOUNTER — Other Ambulatory Visit: Payer: Self-pay | Admitting: Physician Assistant

## 2020-09-16 ENCOUNTER — Ambulatory Visit (INDEPENDENT_AMBULATORY_CARE_PROVIDER_SITE_OTHER): Payer: Medicare Other | Admitting: Cardiovascular Disease

## 2020-09-16 ENCOUNTER — Encounter: Payer: Self-pay | Admitting: Cardiovascular Disease

## 2020-09-16 ENCOUNTER — Other Ambulatory Visit: Payer: Self-pay

## 2020-09-16 VITALS — BP 108/60 | HR 74 | Ht 71.0 in | Wt 218.4 lb

## 2020-09-16 DIAGNOSIS — I350 Nonrheumatic aortic (valve) stenosis: Secondary | ICD-10-CM

## 2020-09-16 DIAGNOSIS — K769 Liver disease, unspecified: Secondary | ICD-10-CM

## 2020-09-16 MED ORDER — CEPHALEXIN 500 MG PO CAPS: ORAL_CAPSULE | ORAL | 3 refills | Status: DC

## 2020-09-16 NOTE — Progress Notes (Signed)
Structural Heart Clinic Note  Chief Complaint  Patient presents with  . Follow-up    Severe aortic stenosis   History of Present Illness: 65 yo male with history of depression, GERD, HTN, hyperlipidemia, fatty liver disease, diabetes mellitus and bicuspid aortic valve with severe aortic stenosis who is here today for follow up in the structural heart clinic to discuss his aortic stenosis. I met him in February 2022 as a new consult, referred by Dr. Margaretann Loveless, for further discussion regarding his aortic stenosis and AVR vs TAVR. He has been followed for moderate aortic stenosis. Echo 07/11/20 with LVEF=55-60%, severe LVH. The aortic valve appears to be bicuspid. There is severe calcification of the aortic valve leaflets. Mean gradient 41 mmHg, peak gradient 59 mmHg. AVA 1.24 cm2, dimensionless index 0.35. At his first visit here he described resting chest pain but no exertional dyspnea or chest pain. Cardiac cath 08/15/20 with no evidence of CAD. Gated cardiac CT 08/22/20 with bicuspid aortic valve with thickened and calcified leaflets with partial fusion of the right and left coronary cusps with severely reduced cusp separation c/w severe AS. Valve calcium score of 2886. AV area 559 mm2 suitable for a 29 mm Edwards Sapien 3 valve (Edwards rep has commented on the use of a 26 mm Sapien 3 valve).  CTA of the chest/abdomen and pelvis with anatomy suitable for transfemoral approach to TAVR. Incidental finding of a hypervascular lesion in the right lobe of the liver as well as left adrenal nodule.   He is here today for follow up. The patient denies any lower extremity edema, orthopnea, PND, dizziness, near syncope or syncope. He has had some chest pressure and palpitations. He is concerned today about the liver lesion found on his CT scan. He is having a prostate biopsy tomorrow. He lives in [place] with his wife. He sees a dentist regularly and has a cavity that needs to be repaired. He is seeing his  dentist soon.He is retired from [employer].    Primary Care Physician: Ria Bush, MD Primary Cardiologist: Margaretann Loveless Referring Cardiologist: Margaretann Loveless  Past Medical History:  Diagnosis Date  . Aortic stenosis   . COVID-19 virus infection 05/2020  . Depression    intolerant of cymbalta and wellbutrin  . Diverticulosis of colon   . Esophagitis 03/2015   by EGD  . Gastritis 03/2015   by EGD - mild, chronic  . Hyperlipidemia   . Hypertension   . Lichen simplex chronicus 05/2015   R scrotum [name])  . Murmur    aortic sclerosis, SBE  prophlaxis not needed as per cardio  . NAFLD (nonalcoholic fatty liver disease) 01/2015   by [place]  . 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 T2DM (type 2 diabetes mellitus) ([place])    DSME 09/2013    Past Surgical History:  Procedure Laterality Date  . COLONOSCOPY  2006   diverticulosis per pt  . COLONOSCOPY  03/2015   2 polyps, rpt 5 yrs [name])  . ESOPHAGOGASTRODUODENOSCOPY  03/2015   mild chronic gastritis, esophagitis   . LUMBAR LAMINECTOMY  1989   L5-S1  . RIGHT/LEFT HEART CATH AND CORONARY ANGIOGRAPHY N/A 08/15/2020   Procedure: RIGHT/LEFT HEART CATH AND CORONARY ANGIOGRAPHY;  Surgeon: Burnell Blanks, MD;  Location: [facility];  Service: Cardiovascular;  Laterality: N/A;    Current Outpatient Medications  Medication Sig Dispense Refill  . aspirin EC 81 MG tablet Take 81 mg by mouth every  evening. Swallow whole.    . Blood Glucose Monitoring Suppl (ONETOUCH VERIO FLEX SYSTEM) w/Device KIT 1 kit by Does not apply route daily. Use to check blood sugars 2 times daily 1 kit 2  . Cholecalciferol (VITAMIN D3) 50 MCG (2000 UT) TABS Take 2,000 Units by mouth daily.    . Cyanocobalamin (VITAMIN B-12) 2500 MCG SUBL Take 2,500 mcg by mouth daily.    Marland Kitchen FARXIGA 10 MG TABS tablet TAKE 1 TABLET (10 MG TOTAL) BY MOUTH EVERY MORNING. TAKE 1 TABLET BY MOUTH EVERY MORNING. 30 tablet 2  . fluticasone (FLONASE)  50 MCG/ACT nasal spray Place 1 spray into both nostrils daily as needed for allergies or rhinitis.    Marland Kitchen ibuprofen (ADVIL,MOTRIN) 200 MG tablet Take 400 mg by mouth every 8 (eight) hours as needed (PAIN).    Marland Kitchen losartan (COZAAR) 100 MG tablet Take 50 mg by mouth daily.    . Magnesium 250 MG TABS Take 250 mg by mouth every evening.    . metFORMIN (GLUCOPHAGE) 1000 MG tablet TAKE 1 TABLET (1,000 MG TOTAL) BY MOUTH 2 TIMES DAILY WITH A MEAL. 180 tablet 1  . metoprolol tartrate (LOPRESSOR) 50 MG tablet Take one tablet by mouth as directed prior to 3/8 scan 1 tablet 0  . omeprazole (PRILOSEC) 20 MG capsule Take 1 capsule (20 mg total) by mouth daily as needed.    Glory Rosebush VERIO test strip USE TO CHECK BLOOD SUGARS 2 TIMES DAILY 200 strip 3  . rosuvastatin (CRESTOR) 5 MG tablet Take 1 tablet (5 mg total) by mouth daily. 90 tablet 3  . Semaglutide, 1 MG/DOSE, (OZEMPIC, 1 MG/DOSE,) 4 MG/3ML SOPN Inject 1 mg as directed once a week. 9 mL 1  . sildenafil (REVATIO) 20 MG tablet Take 2-5 tablets (40-100 mg total) by mouth daily as needed (relations). 30 tablet 3  . TURMERIC PO Take 1,800 mg by mouth every evening.    . Vitamin D-Vitamin K (K2 PLUS D3 PO) Take 1 tablet by mouth every evening.    . Zinc 50 MG TABS Take 50 mg by mouth every evening.     No current facility-administered medications for this visit.    Allergies  Allergen Reactions  . Penicillins     Respiratory distress as infant    Social History   Socioeconomic History  . Marital status: Married    Spouse name: Not on file  . Number of children: 2  . Years of education: Not on file  . Highest education level: Not on file  Occupational History  . Occupation: Retired-Worked for the [employer]  . Smoking status: Never Smoker  . Smokeless tobacco: Never Used  Substance and Sexual Activity  . Alcohol use: Yes    Alcohol/week: 0.0 standard drinks    Comment: occasional  . Drug use: No  . Sexual activity:  Not on file  Other Topics Concern  . 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  Keeps record of caloric intake   Social Determinants of Health   Financial Resource Strain: Not on file  Food Insecurity: Not on file  Transportation Needs: Not on file  Physical Activity: Not on file  Stress: Not on file  Social  Connections: Not on file  Intimate Partner Violence: Not on file    Family History  Problem Relation Age of Onset  . Alzheimer's disease Father   . Hypertension Father   . Stroke Father   . Hyperlipidemia Father   . Prostate cancer Brother   . Diabetes Brother   . Alcohol abuse Brother   . CAD Brother   . Coronary artery disease Maternal Uncle   . Schizophrenia Paternal Uncle   . Emphysema Mother   . Alcohol abuse Mother   . Stroke Maternal Grandmother   . Stroke Maternal Grandfather   . Diabetes Maternal Grandfather   . Stroke Paternal Grandmother   . Stroke Paternal Grandfather     Review of Systems:  As stated in the HPI and otherwise negative.   BP 108/60   Pulse 74   Ht '5\' 11"'  (1.803 m)   Wt 218 lb 6.4 oz (99.1 kg)   SpO2 97%   BMI 30.46 kg/m   Physical Examination:  General: Well developed, well nourished, NAD  HEENT: OP clear, mucus membranes moist  SKIN: warm, dry. No rashes. Neuro: No focal deficits  Musculoskeletal: Muscle strength 5/5 all ext  Psychiatric: Mood and affect normal  Neck: No JVD, no carotid bruits, no thyromegaly, no lymphadenopathy.  Lungs:Clear bilaterally, no wheezes, rhonci, crackles Cardiovascular: Regular rate and rhythm. Systolic murmur.  Abdomen:Soft. Bowel sounds present. Non-tender.  Extremities: No lower extremity edema. Pulses are 2 + in the bilateral DP/PT.  EKG:  EKG is not  ordered today.  Echo 07/11/20: 1. Aortic valve is bicuspid or functionally bicuspid. Aortic stenosis is  severe with peak/mean transaortic gradients 68/41 mmHg.  2. Left ventricular ejection fraction, by estimation, is 55 to 60%. The  left ventricle has normal function. The left ventricle has no regional  wall motion abnormalities. There is severe concentric left ventricular  hypertrophy. Left ventricular diastolic  parameters are consistent with Grade I diastolic dysfunction (impaired  relaxation). Elevated left atrial pressure.  3. Right ventricular systolic function is normal. The right ventricular  size is normal. There is normal pulmonary artery systolic pressure. The  estimated right ventricular systolic pressure is 62.2 mmHg.  4. Left atrial size was mildly dilated.  5. The mitral valve is normal in structure. Trivial mitral valve  regurgitation. No evidence of mitral stenosis.  6. The aortic valve is bicuspid. There is severe calcifcation of the  aortic valve. There is severe thickening of the aortic valve. Aortic valve  regurgitation is mild. Severe aortic valve stenosis. Aortic valve mean  gradient measures 41.0 mmHg.  7. The inferior vena cava is normal in size with greater than 50%  respiratory variability, suggesting right atrial pressure of 3 mmHg.   FINDINGS  Left Ventricle: Left ventricular ejection fraction, by estimation, is 55  to 60%. The left ventricle has normal function. The left ventricle has no  regional wall motion abnormalities. The left ventricular internal cavity  size was normal in size. There is  severe concentric left ventricular hypertrophy. Left ventricular  diastolic parameters are consistent with Grade I diastolic dysfunction  (impaired relaxation). Elevated left atrial pressure.   Right Ventricle: The right ventricular size is normal. No increase in  right ventricular wall thickness. Right ventricular systolic function is  normal. There  is normal pulmonary artery systolic pressure. The tricuspid  regurgitant velocity is 2.44 m/s, and  with an assumed right atrial pressure of 3 mmHg, the estimated right  ventricular systolic pressure is 63.3 mmHg.  Left Atrium: Left atrial size was mildly dilated.   Right Atrium: Right atrial size was normal in size.   Pericardium: There is no evidence of pericardial effusion.   Mitral Valve: The mitral valve is normal in structure. Trivial mitral  valve regurgitation. No evidence of mitral valve stenosis.   Tricuspid Valve: The tricuspid valve is normal in structure. Tricuspid  valve regurgitation is mild . No evidence of tricuspid stenosis.   Aortic Valve: The aortic valve is bicuspid. There is severe calcifcation  of the aortic valve. There is severe thickening of the aortic valve.  Aortic valve regurgitation is mild. Severe aortic stenosis is present.  Aortic valve mean gradient measures 41.0  mmHg. Aortic valve peak gradient measures 59.3 mmHg. Aortic valve area, by  VTI measures 1.38 cm.   Pulmonic Valve: The pulmonic valve was normal in structure. Pulmonic valve  regurgitation is not visualized. No evidence of pulmonic stenosis.   Aorta: The aortic root is normal in size and structure.   Venous: The inferior vena cava is normal in size with greater than 50%  respiratory variability, suggesting right atrial pressure of 3 mmHg.   IAS/Shunts: No atrial level shunt detected by color flow Doppler.     LEFT VENTRICLE  PLAX 2D  LVIDd:     3.50 cm Diastology  LVIDs:     2.10 cm LV e' medial:  6.31 cm/s  LV PW:     1.60 cm LV E/e' medial: 13.7  LV IVS:    1.50 cm LV e' lateral:  6.66 cm/s  LVOT diam:   2.25 cm LV E/e' lateral: 13.0  LV SV:     117  LV SV Index:  53  LVOT Area:   3.98 cm     RIGHT VENTRICLE  RV Basal diam: 3.00 cm  RV S prime:   10.60 cm/s  TAPSE (M-mode): 2.4 cm  RVSP:      26.8 mmHg   LEFT ATRIUM        Index    RIGHT ATRIUM      Index  LA diam:    3.40 cm 1.54 cm/m RA Pressure: 3.00 mmHg  LA Vol (A2C):  65.4 ml 29.67 ml/m RA Area:   11.90 cm  LA Vol (A4C):  40.8 ml 18.51 ml/m RA Volume:  27.00 ml 12.25 ml/m  LA Biplane Vol: 53.6 ml 24.32 ml/m  AORTIC VALVE  AV Area (Vmax):  1.34 cm  AV Area (Vmean):  1.24 cm  AV Area (VTI):   1.38 cm  AV Vmax:      385.00 cm/s  AV Vmean:     279.000 cm/s  AV VTI:      0.847 m  AV Peak Grad:   59.3 mmHg  AV Mean Grad:   41.0 mmHg  LVOT Vmax:     130.00 cm/s  LVOT Vmean:    87.300 cm/s  LVOT VTI:     0.295 m  LVOT/AV VTI ratio: 0.35    AORTA  Ao Root diam: 3.80 cm   MITRAL VALVE        TRICUSPID VALVE  MV Area (PHT): 3.31 cm   TR Peak grad:  23.8 mmHg  MV Decel Time: 229 msec   TR Vmax:    244.00 cm/s  MV E velocity: 86.50 cm/s  Estimated RAP: 3.00 mmHg  MV A velocity: 105.00 cm/s RVSP:      26.8 mmHg  MV E/A ratio: 0.82  SHUNTS               Systemic VTI: 0.30 m               Systemic Diam: 2.25 cm   Gated Cardiac CT 08/22/20: Cardiac TAVR CT  TECHNIQUE: The patient was scanned on a Siemens Force 951 slice scanner. A 120 kV retrospective scan was triggered in the descending thoracic aorta at 111 HU's. Gantry rotation speed was 270 msecs and collimation was .9 mm. The 3D data set was reconstructed in 5% intervals of the R-R cycle. Systolic and diastolic phases were analyzed on a dedicated work station using MPR, MIP and VRT modes. The patient received 162m OMNIPAQUE IOHEXOL 350 MG/ML SOLN of contrast.  FINDINGS: Aortic Valve: Bicuspid aortic valve with partial fusion of right and left coronary cusps, raphe present. Severely reduced cusp separation. Severely thickened, severely calcified aortic valve cusps.  AV calcium score: 2886  Virtual Basal Annulus  Measurements:  Maximum/Minimum Diameter: 32.1 x 23.6 mm  Perimeter: 86.3 mm  Area: 559 mm2  No significant LVOT calcifications.  Based on these measurements, the annulus would be suitable for a 29 mm Sapien 3 valve.  Sinus of Valsalva Measurements:  Non-coronary:  36 mm  Right - coronary:  36 mm  Left - coronary:  36 mm  Sinus of Valsalva Height:  Left: 20.4 mm  Right:22.4 mm  Aorta: Conventional 3 vessel branch pattern of aortic arch. No coarctation of the aorta. Minimal aortic atherosclerosis.  Sinotubular Junction:  30 mm  Ascending Thoracic Aorta:  35 mm  Aortic Arch:  29 mm  Descending Thoracic Aorta:  27 mm  Coronary Artery Height above Annulus:  Left Main: 15.1 mm  Right Coronary: 18.4 mm  Coronary Arteries: Coronary calcium score 96, 49th percentile for age and sex matched peers.  Optimum Fluoroscopic Angle for Delivery: LAO 3 CAU 3  IMPRESSION: 1. Bicuspid aortic valve with partial fusion of left and right coronary cusps and raphe present. Severely reduced cusp separation. Severely thickened, severely calcified aortic valve cusps.  2.  AV calcium score: 2886  3.  Annulus area 559 mm2, suitable for 29 mm Sapien 3 valve.  4.  Sufficient coronary artery height from annulus.  5.  Normal caliber ascending thoracic aorta.  6. Optimum Fluoroscopic Angle for Delivery: LAO 3 CAU 3   Electronically Signed   By: GCherlynn Kaiser  On: 08/22/2020 13:17   Addended by [name] MD on 08/22/2020 1:19 PM    Study Result  Narrative & Impression  EXAM: OVER-READ INTERPRETATION  CT CHEST  The following report is an over-read performed by radiologist Dr. DVinnie Langtonof [facility], [place] 08/20/2020. This over-read does not include interpretation of cardiac or coronary anatomy or pathology. The coronary calcium score/coronary CTA interpretation by the cardiologist is attached.  COMPARISON:   None.  FINDINGS: Extracardiac findings will be described separately under dictation for contemporaneously obtained CTA chest, abdomen and pelvis.  IMPRESSION: Please see separate dictation for contemporaneously obtained CTA chest, abdomen and pelvis dated 08/20/2020 for full description of relevant extracardiac findings.   CT chest/abdomen/pelvis 08/22/20:  TECHNIQUE: Multidetector CT imaging through the chest, abdomen and pelvis was performed using the standard protocol during bolus administration of intravenous contrast. Multiplanar reconstructed images and MIPs were obtained and reviewed to evaluate the vascular anatomy.  CONTRAST:  1079mOMNIPAQUE IOHEXOL 350 MG/ML SOLN  COMPARISON:  No prior chest CT.  CT of the abdomen 12/18/2016.  FINDINGS:  CTA CHEST FINDINGS  Cardiovascular: Heart size is normal. There is no significant pericardial fluid, thickening or pericardial calcification. There is aortic atherosclerosis, as well as atherosclerosis of the great vessels of the mediastinum and the coronary arteries, including calcified atherosclerotic plaque in the left anterior descending and right coronary arteries. Severe thickening calcification of the aortic valve.  Mediastinum/Lymph Nodes: No pathologically enlarged mediastinal or hilar lymph nodes. Esophagus is unremarkable in appearance. No axillary lymphadenopathy.  Lungs/Pleura: No suspicious appearing pulmonary nodules or masses are noted. No acute consolidative airspace disease. No pleural effusions.  Musculoskeletal/Soft Tissues: There are no aggressive appearing lytic or blastic lesions noted in the visualized portions of the skeleton.  CTA ABDOMEN AND PELVIS FINDINGS  Hepatobiliary: In the right lobe of the liver there is a 2.3 x 1.5 cm hypervascular lesion (axial image 116 of series 4), incompletely characterized. No other suspicious appearing cystic or solid hepatic lesions. No intra or  extrahepatic biliary ductal dilatation. Gallbladder is normal in appearance.  Pancreas: No pancreatic mass. No pancreatic ductal dilatation. No pancreatic or peripancreatic fluid collections or inflammatory changes.  Spleen: Unremarkable.  Adrenals/Urinary Tract: Bilateral kidneys and the right adrenal gland are normal in appearance. 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 No hydroureteronephrosis. Urinary bladder is normal in appearance.  Stomach/Bowel: The appearance of the stomach is normal. There is no pathologic dilatation of small bowel or colon. Several colonic diverticulae are noted, without surrounding inflammatory changes to suggest an acute diverticulitis at this time. Appendicoliths at the neck of the appendix. Appendix is otherwise normal in appearance.  Vascular/Lymphatic: Minimal atherosclerotic calcifications in the abdominal aorta, without evidence of aneurysm or dissection in the abdominal or pelvic vasculature. Vascular findings and measurements pertinent to potential TAVR procedure, as detailed below. No lymphadenopathy noted in the abdomen or pelvis.  Reproductive: Prostate gland and seminal vesicles are unremarkable in appearance.  Other: No significant volume of ascites.  No pneumoperitoneum.  Musculoskeletal: There are no aggressive appearing lytic or blastic lesions noted in the visualized portions of the skeleton.  VASCULAR MEASUREMENTS PERTINENT TO TAVR:  AORTA:  Minimal Aortic Diameter-19 x 17 mm  Severity of Aortic Calcification-minimal  RIGHT PELVIS:  Right Common Iliac Artery -  Minimal Diameter-12.5 x 12.1 mm  Tortuosity-mild  Calcification-none  Right External Iliac Artery -  Minimal Diameter-9.9 x 10.2 mm  Tortuosity-severe  Calcification-none  Right Common  Femoral Artery -  Minimal Diameter-9.5 x 9.7 mm  Tortuosity-mild  Calcification-none  LEFT PELVIS:  Left Common Iliac Artery -  Minimal Diameter-11.6 x 11.4 mm  Tortuosity-mild  Calcification-none  Left External Iliac Artery -  Minimal Diameter-10.4 x 10.4 mm  Tortuosity-moderate  Calcification-none  Left Common Femoral Artery -  Minimal Diameter-10.7 x 9.9 mm  Tortuosity-mild  Calcification-none  Review of the MIP images confirms the above findings.  IMPRESSION: 1. Vascular findings and measurements pertinent to potential TAVR procedure, as detailed above. 2. Severe thickening calcification of the aortic valve, compatible with reported clinical history of severe aortic stenosis. 3. Aortic atherosclerosis, in addition to 2 vessel coronary artery disease. Please note that although the presence of coronary artery calcium documents the presence of coronary artery disease, the severity of this disease and any potential stenosis cannot be assessed on this non-gated CT examination. Assessment for potential risk factor modification, dietary therapy or pharmacologic therapy may be warranted, if clinically indicated. 4. 2.3 x 1.5 cm  hypervascular lesion in the right lobe of the liver, not evident on prior examination from 2018. This may represent a benign perfusion anomaly or flash fill cavernous hemangioma, however, the possibility of an aggressive hypervascular lesion is not excluded. Definitive characterization with follow-up nonemergent abdominal MRI with and without IV gadolinium is recommended in the near future to better evaluate this finding. 5. Colonic diverticulosis without evidence of acute diverticulitis at this time. 6. Appendicoliths at the neck of the appendix. Currently, the appendix is otherwise normal in appearance. 7. 1.5 cm left adrenal nodule, similar in retrospect to remote prior study from 2018, favored to represent a benign  lesions such as a lipid poor adenoma. Attention at time of forthcoming abdominal MRI is recommended for additional characterization.  Recent Labs: 07/11/2020: ALT 18 08/02/2020: BUN 16; Creatinine, Ser 0.83; Platelets 264 08/15/2020: Hemoglobin 15.0; Hemoglobin 14.6; Potassium 4.0; Potassium 3.8; Sodium 138; Sodium 140    Wt Readings from Last 3 Encounters:  09/16/20 218 lb 6.4 oz (99.1 kg)  08/15/20 215 lb (97.5 kg)  08/02/20 225 lb 3.2 oz (102.2 kg)     Other studies Reviewed: Additional studies/ records that were reviewed today include: EKG, echo images, office notes Review of the above records demonstrates: severe AS   STS Risk Score: Risk of Mortality: 0.982% Renal Failure: 1.262% Permanent Stroke: 1.056% Prolonged Ventilation: 4.176% DSW Infection: 0.144% Reoperation: 2.772% Morbidity or Mortality: 7.401% Short Length of Stay: 56.404% Long Length of Stay: 2.970%    Assessment and Plan:   1. Severe Aortic Valve Stenosis: He has a bicuspid aortic valve with severe stenosis. The valve appears to have severe stenosis by gated cardiac CT. He is having some dyspnea and chest pressure as well as fluttering. I think he he would benefit from AVR. He would be a candidate for TAVR or surgical AVR. His CT scans have been completed and if we choose to proceed with TAVR, he has transfemoral access. Incidental finding on CT abdomen of liver lesion and adrenal nodule. Will arrange MRI of the abdomen with/without gadolinium. BMET today. Following his MRI, will make further plans regarding AVR. He will need carotid artery dopplers and f/u in the CT surgery office.    I have reviewed the natural history of aortic stenosis with the patient and their family members  who are present today. We have discussed the limitations of medical therapy and the poor prognosis associated with symptomatic aortic stenosis. We have reviewed potential treatment options, including palliative medical  therapy, conventional surgical aortic valve replacement, and transcatheter aortic valve replacement. We discussed treatment options in the context of the patient's specific comorbid medical conditions.   I have answered all of his questions today.      Current medicines are reviewed at length with the patient today.  The patient does not have concerns regarding medicines.  The following changes have been made:  no change  Labs/ tests ordered today include:   Orders Placed This Encounter  Procedures  . MR ABDOMEN WWO CONTRAST  . Basic metabolic panel   Disposition:   F/u with the valve team as above.   Signed, Lauree Chandler, MD 09/16/2020 2:52 PM    [facility] [address], [place], [place]  [address] Phone: [phone]; Fax: [phone]

## 2020-09-16 NOTE — Addendum Note (Signed)
Addended by: Rodman Key on: 09/16/2020 03:17 PM   Modules accepted: Orders

## 2020-09-16 NOTE — Patient Instructions (Signed)
Medication Instructions:  No changes *If you need a refill on your cardiac medications before your next appointment, please call your pharmacy*   Lab Work: Today: BMET  If you have labs (blood work) drawn today and your tests are completely normal, you will receive your results only by: Marland Kitchen MyChart Message (if you have MyChart) OR . A paper copy in the mail If you have any lab test that is abnormal or we need to change your treatment, we will call you to review the results.   Testing/Procedures: MRI ABDOMEN - to eval liver lesion   Follow-Up: With TAVR team as planned. We will be setting up your next studies and will contact you with the next steps.

## 2020-09-17 DIAGNOSIS — R972 Elevated prostate specific antigen [PSA]: Secondary | ICD-10-CM | POA: Diagnosis not present

## 2020-09-17 LAB — BASIC METABOLIC PANEL
BUN/Creatinine Ratio: 15 (ref 10–24)
BUN: 15 mg/dL (ref 8–27)
CO2: 23 mmol/L (ref 20–29)
Calcium: 9.6 mg/dL (ref 8.6–10.2)
Chloride: 100 mmol/L (ref 96–106)
Creatinine, Ser: 1 mg/dL (ref 0.76–1.27)
Glucose: 133 mg/dL — ABNORMAL HIGH (ref 65–99)
Potassium: 4.3 mmol/L (ref 3.5–5.2)
Sodium: 140 mmol/L (ref 134–144)
eGFR: 82 mL/min/{1.73_m2} (ref >59)

## 2020-09-24 DIAGNOSIS — R972 Elevated prostate specific antigen [PSA]: Secondary | ICD-10-CM | POA: Diagnosis not present

## 2020-09-24 DIAGNOSIS — N5201 Erectile dysfunction due to arterial insufficiency: Secondary | ICD-10-CM | POA: Diagnosis not present

## 2020-09-25 ENCOUNTER — Ambulatory Visit (HOSPITAL_COMMUNITY)
Admission: RE | Admit: 2020-09-25 | Discharge: 2020-09-25 | Disposition: A | Discharge status: Home / Self Care | Payer: Medicare Other | Source: Ambulatory Visit | Attending: Cardiovascular Disease | Admitting: Cardiovascular Disease

## 2020-09-25 ENCOUNTER — Ambulatory Visit (HOSPITAL_BASED_OUTPATIENT_CLINIC_OR_DEPARTMENT_OTHER)
Admission: RE | Admit: 2020-09-25 | Discharge: 2020-09-25 | Disposition: A | Discharge status: Home / Self Care | Payer: Medicare Other | Source: Ambulatory Visit

## 2020-09-25 ENCOUNTER — Other Ambulatory Visit: Payer: Self-pay

## 2020-09-25 ENCOUNTER — Encounter: Payer: Self-pay | Admitting: Physical Therapy

## 2020-09-25 ENCOUNTER — Ambulatory Visit: Payer: Medicare Other | Attending: Physician Assistant | Admitting: Physical Therapy

## 2020-09-25 DIAGNOSIS — R2689 Other abnormalities of gait and mobility: Secondary | ICD-10-CM | POA: Diagnosis not present

## 2020-09-25 DIAGNOSIS — I350 Nonrheumatic aortic (valve) stenosis: Secondary | ICD-10-CM | POA: Diagnosis not present

## 2020-09-25 DIAGNOSIS — D3500 Benign neoplasm of unspecified adrenal gland: Secondary | ICD-10-CM | POA: Diagnosis not present

## 2020-09-25 DIAGNOSIS — K769 Liver disease, unspecified: Secondary | ICD-10-CM | POA: Insufficient documentation

## 2020-09-25 DIAGNOSIS — K7689 Other specified diseases of liver: Secondary | ICD-10-CM | POA: Diagnosis not present

## 2020-09-25 DIAGNOSIS — E119 Type 2 diabetes mellitus without complications: Secondary | ICD-10-CM | POA: Insufficient documentation

## 2020-09-25 DIAGNOSIS — E785 Hyperlipidemia, unspecified: Secondary | ICD-10-CM | POA: Insufficient documentation

## 2020-09-25 DIAGNOSIS — M5137 Other intervertebral disc degeneration, lumbosacral region: Secondary | ICD-10-CM | POA: Diagnosis not present

## 2020-09-25 DIAGNOSIS — I10 Essential (primary) hypertension: Secondary | ICD-10-CM | POA: Insufficient documentation

## 2020-09-25 DIAGNOSIS — Q433 Congenital malformations of intestinal fixation: Secondary | ICD-10-CM | POA: Diagnosis not present

## 2020-09-25 MED ORDER — GADOBUTROL 1 MMOL/ML IV SOLN
10.0000 mL | Freq: Once | INTRAVENOUS | Status: AC | PRN
  Administered 2020-09-25: 10 mL via INTRAVENOUS

## 2020-09-25 NOTE — Progress Notes (Signed)
Carotid arterial duplex completed.  Results can be found under chart review under CV PROC. 09/25/2020 1:10 PM Guilianna Mckoy RVT, RDMS

## 2020-09-25 NOTE — Therapy (Signed)
[place], [place], [address] Phone: [phone]   Fax:  [phone]  Physical Therapy Pre-TAVR Evaluation  Patient Details  Name: Evan [name] MRN: [id number] Date of Birth: 01-09-1956 Referring Provider (PT): Eileen Stanford, [place]   Encounter Date: 09/25/2020   PT End of Session - 09/25/20 1236    Visit Number 1    Number of Visits 1    Date for PT Re-Evaluation 09/25/20    Authorization Type MCR A&B    PT Start Time 7169    PT Stop Time 1215    PT Time Calculation (min) 30 min    Equipment Utilized During Treatment Gait belt    Activity Tolerance Patient tolerated treatment well    Behavior During Therapy [facility] for tasks assessed/performed           Past Medical History:  Diagnosis Date  . Aortic stenosis   . COVID-19 virus infection 05/2020  . Depression    intolerant of cymbalta and wellbutrin  . Diverticulosis of colon   . Esophagitis 03/2015   by EGD  . Gastritis 03/2015   by EGD - mild, chronic  . Hyperlipidemia   . Hypertension   . Lichen simplex chronicus 05/2015   R scrotum [name])  . Murmur    aortic sclerosis, SBE  prophlaxis not needed as per cardio  . NAFLD (nonalcoholic fatty liver disease) 01/2015   by [place]  . 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 T2DM (type 2 diabetes mellitus) ([place])    DSME 09/2013    Past Surgical History:  Procedure Laterality Date  . COLONOSCOPY  2006   diverticulosis per pt  . COLONOSCOPY  03/2015   2 polyps, rpt 5 yrs [name])  . ESOPHAGOGASTRODUODENOSCOPY  03/2015   mild chronic gastritis, esophagitis   . LUMBAR LAMINECTOMY  1989   L5-S1  . RIGHT/LEFT HEART CATH AND CORONARY ANGIOGRAPHY N/A 08/15/2020   Procedure: RIGHT/LEFT HEART CATH AND CORONARY ANGIOGRAPHY;  Surgeon: Burnell Blanks, MD;  Location: [facility];  Service: Cardiovascular;  Laterality: N/A;    There were no vitals filed for this  visit.    Subjective Assessment - 09/25/20 1146    Subjective Patient reports some slight pressure in his chest and will feel a fluttering while at rest. He does feel symptoms feel more frequent and he gets dizzy when bending over mainly worsening over past 6 months. States he has never really had much energy.    Pertinent History Previous L5-S1 surgery    Limitations Walking    How long can you stand comfortably? No limitation    How long can you walk comfortably? No limitation, patient walks more than 5 miles per day    Patient Stated Goals Get heart better.    Currently in Pain? No/denies              [facility] PT Assessment - 09/25/20 0001      Assessment   Medical Diagnosis Severe Aortic Stenosis    Referring Provider (PT) Eileen Stanford, PA-C    Onset Date/Surgical Date --   6 months   Hand Dominance Right    Next MD Visit 09/25/2020    Prior Therapy No      Precautions   Precautions None      Restrictions   Weight Bearing Restrictions No      Balance Screen   Has the patient fallen in the past  6 months No    Has the patient had a decrease in activity level because of a fear of falling?  No    Is the patient reluctant to leave their home because of a fear of falling?  No      Home Social worker Private residence    Living Arrangements Spouse/significant other;Children    Type of [place] to enter    Entrance Stairs-Number of Steps 3    [place] Two level      Prior Function   Level of [place] Retired    Leisure Walking      Cognition   Overall Cognitive Status Within Functional Limits for tasks assessed      Observation/Other Assessments   Observations Patient appears in no apparent distress    Focus on Therapeutic Outcomes (FOTO)  NA      Posture/Postural Control   Posture Comments Rounded shoulder posture      ROM / Strength   AROM / PROM / Strength AROM;Strength       AROM   Overall AROM Comments BUE and BLE grossly WFL, states slight limitation right shoulder due to impingment      Strength   Overall Strength Comments BUE and BLE grossly 5/5 MMT    Strength Assessment Site Hand    Right/Left hand Right;Left    Right Hand Grip (lbs) 75, 75, 70    Left Hand Grip (lbs) 75, 75, 73      Transfers   Transfers Independent with all Transfers      Ambulation/Gait   Ambulation/Gait Yes    Ambulation/Gait Assistance 7: Independent    Gait Comments Compensated trendelenburg gait            [facility] Pre-Surgical Assessment - 09/25/20 0001    5 Meter Walk Test- trial 1 4 sec    5 Meter Walk Test- trial 2 4 sec.     5 Meter Walk Test- trial 3 4 sec.    5 meter walk test average 4 sec    4 Stage Balance Test tolerated for:  4 sec.    4 Stage Balance Test Position 4    Sit To Stand Test- trial 1 14 sec.    ADL/IADL Independent with: Bathing;Dressing;Meal prep;Finances;Yard work    ADL/IADL Therapist, sports Index Vulnerable    Other comment 10/12 fraility score    6 Minute Walk- Baseline yes    BP (mmHg) 128/107   retaken 5 min later and 125/70   HR (bpm) 83    02 Sat (%RA) 97 %    Modified Borg Scale for Dyspnea 2- Mild shortness of breath    Perceived Rate of Exertion (Borg) 6-    6 Minute Walk Post Test yes    BP (mmHg) 124/70    HR (bpm) 88    02 Sat (%RA) 98 %    Modified Borg Scale for Dyspnea 3- Moderate shortness of breath or breathing difficulty    Perceived Rate of Exertion (Borg) 9- very light    Aerobic Endurance Distance Walked 1550                    Objective measurements completed on examination: See above findings.               PT Education - 09/25/20 1235    Education Details Exam findings    Person(s) Educated Patient  Methods Explanation    Comprehension Verbalized understanding                       Plan - 09/25/20 1237    Clinical Impression Statement See below:    Personal Factors and  Comorbidities Fitness;Time since onset of injury/illness/exacerbation;Past/Current Experience;Comorbidity 3+    Comorbidities history of depression, GERD, HTN, hyperlipidemia, fatty liver disease, diabetes mellitus and bicuspid aortic valve with severe aortic stenosis    Examination-Activity Limitations Locomotion Level    Examination-Participation Restrictions Community Activity    Stability/Clinical Decision Making Stable/Uncomplicated    Clinical Decision Making Low    Rehab Potential Good    PT Frequency One time visit    PT Treatment/Interventions ADLs/Self Care Home Management;Energy conservation;Therapeutic exercise;Gait training    PT Next Visit Plan NA    PT Home Exercise Plan NA    Consulted and Agree with Plan of Care Patient           Clinical Impression Statement: Pt is a 65 yo male presenting to OP PT for evaluation prior to possible TAVR surgery due to severe aortic stenosis. Pt reports onset of chest tightness and endurance deficit approximately worsening 6 months ago. Symptoms are limiting community activity. Pt presents with good ROM and strength, good balance and is not at high fall risk 4 stage balance test, good walking speed and good aerobic endurance per 6 minute walk test. Pt ambulated a total of 1550 feet in 6 minute walk. Shortness of breath and RPE increased with 6 minute walk test. Based on the Short Physical Performance Battery, patient has a frailty rating of 10/12 with </= 5/12 considered frail.   Patient demonstrates the following deficits and impairments:  Abnormal gait,Cardiopulmonary status limiting activity,Decreased activity tolerance,Decreased endurance  Visit Diagnosis: Other abnormalities of gait and mobility     Problem List Patient Active Problem List   Diagnosis Date Noted  . Rising PSA level 08/20/2020  . Severe aortic stenosis   . Family history of prostate cancer 06/29/2020  . Medicare annual wellness visit, initial 12/21/2018  .  Advanced care planning/counseling discussion 12/21/2018  . Chronic pain of left knee 10/07/2017  . Seasonal allergic rhinitis 10/07/2017  . Aortic atherosclerosis ([place]) 12/19/2016  . Hepatic steatosis 12/19/2016  . Carotid stenosis 04/04/2016  . OSA (obstructive sleep apnea) 02/18/2016  . Continuous RUQ abdominal pain 02/18/2016  . Chronic cough 09/18/2015  . Moderate aortic stenosis by prior echocardiogram 05/20/2015  . Obesity, Class I, BMI 30-34.9 07/27/2014  . Lichen simplex chronicus 03/26/2014  . Chronic pain of right knee 10/09/2013  . GERD (gastroesophageal reflux disease) 12/22/2012  . Ophthalmic migraine   . Controlled diabetes mellitus type 2 with complications ([place]) 45/36/4680  . Hyperlipidemia associated with type 2 diabetes mellitus ([place]) 12/03/2009  . MDD (major depressive disorder), recurrent, in full remission ([place]) 12/03/2009  . Essential hypertension 12/03/2009  . DIVERTICULOSIS, COLON 12/03/2009  . 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 SHOULDER IMPINGEMENT SYNDROME, RIGHT 12/03/2009    Hilda Blades, PT, DPT, LAT, ATC 09/25/20  12:44 PM Phone: [phone] Fax: [facility] [address] [place], [place], [address] Phone: [phone]   Fax:  [phone]  Name: Evan [name] MRN: [id number] Date of Birth: 15-Feb-1956

## 2020-10-07 ENCOUNTER — Encounter: Payer: Medicare Other | Admitting: Thoracic Surgery (Cardiothoracic Vascular Surgery)

## 2020-10-08 ENCOUNTER — Encounter: Payer: Medicare Other | Admitting: Surgery

## 2020-10-09 ENCOUNTER — Institutional Professional Consult (permissible substitution) (INDEPENDENT_AMBULATORY_CARE_PROVIDER_SITE_OTHER): Payer: Medicare Other | Admitting: Surgery

## 2020-10-09 ENCOUNTER — Other Ambulatory Visit: Payer: Self-pay

## 2020-10-09 VITALS — Ht 71.0 in

## 2020-10-09 DIAGNOSIS — I350 Nonrheumatic aortic (valve) stenosis: Secondary | ICD-10-CM

## 2020-10-11 ENCOUNTER — Encounter: Payer: Self-pay | Admitting: Surgery

## 2020-10-11 NOTE — Progress Notes (Signed)
Patient ID: Evan [name], male   DOB: 1955/12/13, 65 y.o.   MRN: [id number]  [facility] CONSULTATION REPORT  Referring Provider is Elouise Munroe, MD Primary Cardiologist is No primary care provider on file. PCP is Ria Bush, MD  Chief Complaint  Patient presents with  . Aortic Stenosis    Initial surgical consult, review CTA chest/abd/morph 3/8, cath 3/3, echo 1/27    HPI:  The patient is a 65 year old gentleman with a history of hypertension, hyperlipidemia, type 2 diabetes, fatty liver disease, GERD, depression, and bicuspid aortic valve disease with severe aortic stenosis.  He had a 2D echocardiogram in August 2021 that showed a mean gradient of 37 mmHg with a valve area of 1.15 cm.  Left ventricular ejection fraction is 55 to 60% with moderate LVH and grade 1 diastolic dysfunction.  His most recent echo on 07/11/2020 showed an increase in the mean gradient to 41 mmHg consistent with severe aortic stenosis.  There is mild aortic insufficiency.  The valve is severely calcified and bicuspid.  Left ventricular ejection fraction is 55 to 60% with severe concentric LVH and grade 1 diastolic dysfunction.  He underwent cardiac catheterization on 08/15/2020 which showed no evidence of coronary disease.  The mean gradient across aortic valve was 34 mmHg with peak to peak gradient of 44 mmHg.  The patient is here today with his wife.  He reports having exertional chest pressure as well as some at rest he has had frequent episodes of palpitations.  He denies any shortness of breath or fatigue.  He has had dizzy spells with position changes.  He remains active although he has noted some recent decrease in his exercise tolerance.  Past Medical History:  Diagnosis Date  . Aortic stenosis   . COVID-19 virus infection 05/2020  . Depression    intolerant of cymbalta and wellbutrin  . Diverticulosis of colon   .  Esophagitis 03/2015   by EGD  . Gastritis 03/2015   by EGD - mild, chronic  . Hyperlipidemia   . Hypertension   . Lichen simplex chronicus 05/2015   R scrotum [name])  . Murmur    aortic sclerosis, SBE  prophlaxis not needed as per cardio  . NAFLD (nonalcoholic fatty liver disease) 01/2015   by [place]  . 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 T2DM (type 2 diabetes mellitus) ([place])    DSME 09/2013    Past Surgical History:  Procedure Laterality Date  . COLONOSCOPY  2006   diverticulosis per pt  . COLONOSCOPY  03/2015   2 polyps, rpt 5 yrs [name])  . ESOPHAGOGASTRODUODENOSCOPY  03/2015   mild chronic gastritis, esophagitis   . LUMBAR LAMINECTOMY  1989   L5-S1  . RIGHT/LEFT HEART CATH AND CORONARY ANGIOGRAPHY N/A 08/15/2020   Procedure: RIGHT/LEFT HEART CATH AND CORONARY ANGIOGRAPHY;  Surgeon: Burnell Blanks, MD;  Location: [facility];  Service: Cardiovascular;  Laterality: N/A;    Family History  Problem Relation Age of Onset  . Alzheimer's disease Father   . Hypertension Father   . Stroke Father   . Hyperlipidemia Father   . Prostate cancer Brother   . Diabetes Brother   . Alcohol abuse Brother   . CAD Brother   . Coronary artery disease Maternal Uncle   . Schizophrenia Paternal Uncle   . Emphysema Mother   . Alcohol abuse Mother   . Stroke  Maternal Grandmother   . Stroke Maternal Grandfather   . Diabetes Maternal Grandfather   . Stroke Paternal Grandmother   . Stroke Paternal Grandfather     Social History   Socioeconomic History  . Marital status: Married    Spouse name: Not on file  . Number of children: 2  . Years of education: Not on file  . Highest education level: Not on file  Occupational History  . Occupation: Retired-Worked for the [employer]  . Smoking status: Never Smoker  . Smokeless tobacco: Never Used  Substance and Sexual Activity  . Alcohol use: Yes    Alcohol/week: 0.0 standard drinks     Comment: occasional  . Drug use: No  . Sexual activity: Not on file  Other Topics Concern  . 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  Keeps record of caloric intake   Social Determinants of Health   Financial Resource Strain: Not on file  Food Insecurity: Not on file  Transportation Needs: Not on file  Physical Activity: Not on file  Stress: Not on file  Social Connections: Not on file  Intimate Partner Violence: Not on file    Current Outpatient Medications  Medication Sig Dispense Refill  . aspirin EC 81 MG tablet Take 81 mg by mouth every evening. Swallow whole.    . Blood Glucose Monitoring Suppl (ONETOUCH VERIO FLEX SYSTEM) w/Device KIT 1 kit by Does not apply route daily. Use to check blood sugars 2 times daily 1 kit 2  . cephALEXin (KEFLEX) 500 MG capsule TAKE FOUR TABLETS BY MOUTH 60 MINUTES PRIOR TO ANY DENTAL APPOINTMENTS 12 capsule 3  . Cholecalciferol (VITAMIN D3) 50 MCG (2000 UT) TABS Take 2,000 Units by mouth daily.    . Cyanocobalamin (VITAMIN B-12) 2500 MCG SUBL Take 2,500 mcg by mouth daily.    .Marland KitchenFARXIGA 10 MG TABS tablet TAKE 1 TABLET (10 MG TOTAL) BY MOUTH EVERY MORNING. TAKE 1 TABLET BY MOUTH EVERY MORNING. 30 tablet 2  . fluticasone (FLONASE) 50 MCG/ACT nasal spray Place 1 spray into both nostrils daily as needed for allergies or rhinitis.    .Marland Kitchenibuprofen (ADVIL,MOTRIN) 200 MG tablet Take 400 mg by mouth every 8 (eight) hours as needed (PAIN).    .Marland Kitchenlosartan (COZAAR) 100 MG tablet Take 50 mg by mouth daily.    . Magnesium 250 MG TABS Take 250 mg by mouth every evening.    . metFORMIN (GLUCOPHAGE) 1000 MG tablet TAKE 1 TABLET (1,000 MG TOTAL) BY MOUTH 2 TIMES DAILY WITH A MEAL. 180 tablet 1  . metoprolol tartrate  (LOPRESSOR) 50 MG tablet Take one tablet by mouth as directed prior to 3/8 scan 1 tablet 0  . omeprazole (PRILOSEC) 20 MG capsule Take 1 capsule (20 mg total) by mouth daily as needed.    .Glory RosebushVERIO test strip USE TO CHECK BLOOD SUGARS 2 TIMES DAILY 200 strip 3  . rosuvastatin (CRESTOR) 5 MG tablet Take 1 tablet (5 mg total) by mouth daily. 90 tablet 3  . Semaglutide, 1 MG/DOSE, (OZEMPIC, 1 MG/DOSE,) 4 MG/3ML SOPN Inject 1 mg as directed once a week. 9 mL 1  . sildenafil (REVATIO) 20 MG tablet Take 2-5 tablets (40-100  mg total) by mouth daily as needed (relations). 30 tablet 3  . TURMERIC PO Take 1,800 mg by mouth every evening.    . Vitamin D-Vitamin K (K2 PLUS D3 PO) Take 1 tablet by mouth every evening.    . Zinc 50 MG TABS Take 50 mg by mouth every evening.     No current facility-administered medications for this visit.    Allergies  Allergen Reactions  . Penicillins     Respiratory distress as infant      Review of Systems:   General:  normal appetite, normal energy, no weight gain, no weight loss, no fever  Cardiac:  + chest pain with exertion, + chest pain at rest, no SOB with  exertion, no resting SOB, no PND, no orthopnea, + palpitations, no arrhythmia, no atrial fibrillation, no LE edema, + dizzy spells, no syncope  Respiratory:  no shortness of breath, no home oxygen, no productive cough, no dry cough, no bronchitis, no wheezing, no hemoptysis, no asthma, no pain with inspiration or cough, no sleep apnea, no CPAP at night  GI:   no difficulty swallowing, no reflux, no frequent heartburn, no hiatal hernia, no abdominal pain, no constipation, no diarrhea, no hematochezia, no hematemesis, no melena  GU:   no dysuria,  no frequency, no urinary tract infection, no hematuria, no enlarged prostate, no kidney stones, no kidney disease  Vascular:  no pain suggestive of claudication, no pain in feet, no leg cramps, no varicose veins, no DVT, no non-healing foot ulcer  Neuro:   no  stroke, no TIA's, no seizures, no headaches, no temporary blindness one eye,  no slurred speech, no peripheral neuropathy, no chronic pain, no instability of gait, no memory/cognitive dysfunction  Musculoskeletal: + arthritis, no joint swelling, no myalgias, no difficulty walking, normal mobility   Skin:   no rash, no itching, no skin infections, no pressure sores or ulcerations  Psych:   no anxiety, no depression, no nervousness, no unusual recent stress  Eyes:   no blurry vision, + floaters, no recent vision changes, does not wear glasses or contacts  ENT:   + hearing loss, no loose or painful teeth, no dentures, last saw dentist 09/17/20.  Had a cavity that needed to be repaired.  Hematologic:  no easy bruising, no abnormal bleeding, no clotting disorder, no frequent epistaxis  Endocrine:  + diabetes, does check CBG's at home.        Physical Exam:   Ht _0  (1.803 m)   BMI 30.46 kg/m   General:  well-appearing  HEENT:  Unremarkable, NCAT, PERLA, EOMI  Neck:   no JVD, no bruits, no adenopathy   Chest:   clear to auscultation, symmetrical breath sounds, no wheezes, no rhonchi   CV:   RRR, grade lll/VI crescendo/decrescendo murmur heard best at RSB,  no diastolic murmur  Abdomen:  soft, non-tender, no masses   Extremities:  warm, well-perfused, pulses palpable at ankle, no LE edema  Rectal/GU  Deferred  Neuro:   Grossly non-focal and symmetrical throughout  Skin:   Clean and dry, no rashes, no breakdown   Diagnostic Tests:    ECHOCARDIOGRAM REPORT       Patient Name:  Evan [name] Date of Exam: 07/11/2020  Medical Rec #: [id number]    Height:    71.0 in  Accession #:  [id number]   Weight:    222.0 lb  Date of Birth: [birth year]    BSA:     2.204 m  Patient Age:  33 years    BP:      126/84 mmHg  Patient Gender: M        HR:      84 bpm.  Exam Location: [place]   Procedure: 2D Echo, Cardiac Doppler, Color Doppler  and Intracardiac       Opacification Agent   Indications:  Aortic atherosclerosis; I35.0 Nonrheumatic aortic (valve)         stenosis    History:    Patient has prior history of Echocardiogram examinations,  most         recent 01/15/2020. Signs/Symptoms:Murmur; Risk         Factors:Hypertension, Diabetes and Dyslipidemia. Chronic  cough.         Chronic migraine. Obstructive sleep apnea.    Sonographer:  Diamond Nickel RCS  Referring Phys: [id number] [place]    1. Aortic valve is bicuspid or functionally bicuspid. Aortic stenosis is  severe with peak/mean transaortic gradients 68/41 mmHg.  2. Left ventricular ejection fraction, by estimation, is 55 to 60%. The  left ventricle has normal function. The left ventricle has no regional  wall motion abnormalities. There is severe concentric left ventricular  hypertrophy. Left ventricular diastolic  parameters are consistent with Grade I diastolic dysfunction (impaired  relaxation). Elevated left atrial pressure.  3. Right ventricular systolic function is normal. The right ventricular  size is normal. There is normal pulmonary artery systolic pressure. The  estimated right ventricular systolic pressure is 59.7 mmHg.  4. Left atrial size was mildly dilated.  5. The mitral valve is normal in structure. Trivial mitral valve  regurgitation. No evidence of mitral stenosis.  6. The aortic valve is bicuspid. There is severe calcifcation of the  aortic valve. There is severe thickening of the aortic valve. Aortic valve  regurgitation is mild. Severe aortic valve stenosis. Aortic valve mean  gradient measures 41.0 mmHg.  7. The inferior vena cava is normal in size with greater than 50%  respiratory variability, suggesting right atrial pressure of 3 mmHg.   FINDINGS  Left Ventricle: Left ventricular ejection fraction, by estimation, is 55  to 60%. The left  ventricle has normal function. The left ventricle has no  regional wall motion abnormalities. The left ventricular internal cavity  size was normal in size. There is  severe concentric left ventricular hypertrophy. Left ventricular  diastolic parameters are consistent with Grade I diastolic dysfunction  (impaired relaxation). Elevated left atrial pressure.   Right Ventricle: The right ventricular size is normal. No increase in  right ventricular wall thickness. Right ventricular systolic function is  normal. There is normal pulmonary artery systolic pressure. The tricuspid  regurgitant velocity is 2.44 m/s, and  with an assumed right atrial pressure of 3 mmHg, the estimated right  ventricular systolic pressure is 41.6 mmHg.   Left Atrium: Left atrial size was mildly dilated.   Right Atrium: Right atrial size was normal in size.   Pericardium: There is no evidence of pericardial effusion.   Mitral Valve: The mitral valve is normal in structure. Trivial mitral  valve regurgitation. No evidence of mitral valve stenosis.   Tricuspid Valve: The tricuspid valve is normal in structure. Tricuspid  valve regurgitation is mild . No evidence of tricuspid stenosis.   Aortic Valve: The aortic valve is bicuspid. There is severe calcifcation  of the aortic valve. There is severe thickening of the aortic valve.  Aortic valve regurgitation is mild. Severe aortic stenosis  is present.  Aortic valve mean gradient measures 41.0  mmHg. Aortic valve peak gradient measures 59.3 mmHg. Aortic valve area, by  VTI measures 1.38 cm.   Pulmonic Valve: The pulmonic valve was normal in structure. Pulmonic valve  regurgitation is not visualized. No evidence of pulmonic stenosis.   Aorta: The aortic root is normal in size and structure.   Venous: The inferior vena cava is normal in size with greater than 50%  respiratory variability, suggesting right atrial pressure of 3 mmHg.   IAS/Shunts: No atrial level  shunt detected by color flow Doppler.     LEFT VENTRICLE  PLAX 2D  LVIDd:     3.50 cm Diastology  LVIDs:     2.10 cm LV e' medial:  6.31 cm/s  LV PW:     1.60 cm LV E/e' medial: 13.7  LV IVS:    1.50 cm LV e' lateral:  6.66 cm/s  LVOT diam:   2.25 cm LV E/e' lateral: 13.0  LV SV:     117  LV SV Index:  53  LVOT Area:   3.98 cm     RIGHT VENTRICLE  RV Basal diam: 3.00 cm  RV S prime:   10.60 cm/s  TAPSE (M-mode): 2.4 cm  RVSP:      26.8 mmHg   LEFT ATRIUM       Index    RIGHT ATRIUM      Index  LA diam:    3.40 cm 1.54 cm/m RA Pressure: 3.00 mmHg  LA Vol (A2C):  65.4 ml 29.67 ml/m RA Area:   11.90 cm  LA Vol (A4C):  40.8 ml 18.51 ml/m RA Volume:  27.00 ml 12.25 ml/m  LA Biplane Vol: 53.6 ml 24.32 ml/m  AORTIC VALVE  AV Area (Vmax):  1.34 cm  AV Area (Vmean):  1.24 cm  AV Area (VTI):   1.38 cm  AV Vmax:      385.00 cm/s  AV Vmean:     279.000 cm/s  AV VTI:      0.847 m  AV Peak Grad:   59.3 mmHg  AV Mean Grad:   41.0 mmHg  LVOT Vmax:     130.00 cm/s  LVOT Vmean:    87.300 cm/s  LVOT VTI:     0.295 m  LVOT/AV VTI ratio: 0.35    AORTA  Ao Root diam: 3.80 cm   MITRAL VALVE        TRICUSPID VALVE  MV Area (PHT): 3.31 cm   TR Peak grad:  23.8 mmHg  MV Decel Time: 229 msec   TR Vmax:    244.00 cm/s  MV E velocity: 86.50 cm/s  Estimated RAP: 3.00 mmHg  MV A velocity: 105.00 cm/s RVSP:      26.8 mmHg  MV E/A ratio: 0.82               SHUNTS               Systemic VTI: 0.30 m               Systemic Diam: 2.25 cm   Ena Dawley MD  Electronically signed by Ena Dawley MD  Signature Date/Time: 07/11/2020/4:00:50 PM     Physicians  Panel Physicians Referring Physician Case Authorizing Physician  Burnell Blanks, MD (Primary)      Procedures  RIGHT/LEFT HEART  CATH AND CORONARY ANGIOGRAPHY   Conclusion  1. No angiographic evidence of CAD 2. Severe aortic stenosis by echo  with likely bicuspid aortic valve. Cath with mean gradient 34 mmHg, peak to peak gradient 44 mmHg.   Recommendations: Will continue workup for AVR vs TAVR. Will plan CT scans and then will review with valve team. At this time he is relatively asymptomatic and we may be able to follow his aortic stenosis for now.     Recommendations  Antiplatelet/Anticoag Will continue workup for AVR vs TAVR. Will plan CT scans and then will review with valve team. At this time he is relatively asymptomatic and we may be able to follow his aortic stenosis for now.   Indications  Severe aortic stenosis [I35.0 (ICD-10-CM)]   Procedural Details  Technical Details Indication: severe aortic stenosis  Procedure: The risks, benefits, complications, treatment options, and expected outcomes were discussed with the patient. The patient and/or family concurred with the proposed plan, giving informed consent. The patient was brought to the cath lab after IV hydration was given. The patient was sedated with Versed and Fentanyl. The IV catheter in the right antecubital vein was changed for a 5 [place] sheath. Right heart catheterization performed with a balloon tipped catheter. The right wrist was prepped and draped in a sterile fashion. 1% lidocaine was used for local anesthesia. Using the modified Seldinger access technique, a 5 French sheath was placed in the right radial artery. 3 mg Verapamil was given through the sheath. 5000 units IV heparin was given. Standard diagnostic catheters were used to perform selective coronary angiography. The aortic valve was crossed with the J wire and the JL3.5 catheter. LV pressures measured. The sheath was removed from the right radial artery and a Terumo hemostasis band was applied at the arteriotomy site on the right wrist.    Estimated blood loss <50 mL.   During  this procedure medications were administered to achieve and maintain moderate conscious sedation while the patient's heart rate, blood pressure, and oxygen saturation were continuously monitored and I was present face-to-face 100% of this time.   Medications (Filter: Administrations occurring from [phone] to [phone] on 08/15/20)  midazolam (VERSED) injection (mg) Total dose:  2 mg  Date/Time Rate/Dose/Volume Action   08/15/20 0857 2 mg Given    fentaNYL (SUBLIMAZE) injection (mcg) Total dose:  50 mcg  Date/Time Rate/Dose/Volume Action   08/15/20 0857 50 mcg Given    lidocaine (PF) (XYLOCAINE) 1 % injection (mL) Total volume:  4 mL  Date/Time Rate/Dose/Volume Action   08/15/20 0905 2 mL Given   0907 2 mL Given    Heparin (Porcine) in NaCl 1000-0.9 UT/500ML-% SOLN (mL) Total volume:  1,000 mL  Date/Time Rate/Dose/Volume Action   08/15/20 0907 500 mL Given   0907 500 mL Given    Radial Cocktail/Verapamil only Total dose:  Cannot be calculated*  *Administration dose not documented Date/Time Rate/Dose/Volume Action   08/15/20 0908  Given    heparin sodium (porcine) injection (Units) Total dose:  5,000 Units  Date/Time Rate/Dose/Volume Action   08/15/20 0916 5,000 Units Given    iohexol (OMNIPAQUE) 350 MG/ML injection (mL) Total volume:  45 mL  Date/Time Rate/Dose/Volume Action   08/15/20 0937 45 mL Given     Sedation Time  Sedation Time Physician-1: 28 minutes 38 seconds   Contrast  Medication Name Total Dose  iohexol (OMNIPAQUE) 350 MG/ML injection 45 mL    Radiation/Fluoro  Fluoro time: 5.4 (min) DAP: 16279 (mGycm2) Cumulative Air Kerma: 737 (mGy)   Complications   Complications documented before study signed (08/15/2020 9:45 AM)     RIGHT/LEFT  HEART CATH AND CORONARY ANGIOGRAPHY  None Documented by Burnell Blanks, MD 08/15/2020 9:34 AM  Date Found: 08/15/2020  Time Range: Intraprocedure       Coronary  Findings   Diagnostic Dominance: Left  Left Anterior Descending  Vessel is large.  Left Circumflex  Vessel is large.  Right Coronary Artery  Vessel is moderate in size.   Intervention   No interventions have been documented.  Coronary Diagrams   Diagnostic Dominance: Left    Intervention    Implants    No implant documentation for this case.   Syngo Images  Show images for CARDIAC CATHETERIZATION  Images on Long Term Storage  Show images for Katelyn, [place] to Procedure Log  Procedure Log     Hemo Data  Flowsheet Row Most Recent Value  Fick Cardiac Output 7.74 L/min  Fick Cardiac Output Index 3.55 (L/min)/BSA  Aortic Mean Gradient 33.96 mmHg  Aortic Peak Gradient 44 mmHg  Aortic Valve Area 1.33  Aortic Value Area Index 0.61 cm2/BSA  RA A Wave 7 mmHg  RA V Wave 1 mmHg  RA Mean 1 mmHg  RV Systolic Pressure 20 mmHg  RV Diastolic Pressure -1 mmHg  RV EDP 2 mmHg  PA Systolic Pressure 17 mmHg  PA Diastolic Pressure 2 mmHg  PA Mean 9 mmHg  PW A Wave 7 mmHg  PW V Wave 8 mmHg  PW Mean 5 mmHg  AO Systolic Pressure 89 mmHg  AO Diastolic Pressure 52 mmHg  AO Mean 69 mmHg  LV Systolic Pressure 654 mmHg  LV Diastolic Pressure 2 mmHg  LV EDP 5 mmHg  AOp Systolic Pressure 650 mmHg  AOp Diastolic Pressure 63 mmHg  AOp Mean Pressure 82 mmHg  LVp Systolic Pressure 354 mmHg  LVp Diastolic Pressure 1 mmHg  LVp EDP Pressure 5 mmHg  QP/QS 1  TPVR Index 2.54 HRUI  TSVR Index 19.43 HRUI  PVR SVR Ratio 0.06  TPVR/TSVR Ratio 0.13    ADDENDUM REPORT: 08/22/2020 13:17  CLINICAL DATA:  Pre-op transcatheter aortic valve replacement (TAVR)  EXAM: Cardiac TAVR CT  TECHNIQUE: The patient was scanned on a Siemens Force 656 slice scanner. A 120 kV retrospective scan was triggered in the descending thoracic aorta at 111 HU's. Gantry rotation speed was 270 msecs and collimation was .9 mm. The 3D data set was reconstructed in 5% intervals of the  R-R cycle. Systolic and diastolic phases were analyzed on a dedicated work station using MPR, MIP and VRT modes. The patient received 1107m OMNIPAQUE IOHEXOL 350 MG/ML SOLN of contrast.  FINDINGS: Aortic Valve: Bicuspid aortic valve with partial fusion of right and left coronary cusps, raphe present. Severely reduced cusp separation. Severely thickened, severely calcified aortic valve cusps.  AV calcium score: 2886  Virtual Basal Annulus Measurements:  Maximum/Minimum Diameter: 32.1 x 23.6 mm  Perimeter: 86.3 mm  Area: 559 mm2  No significant LVOT calcifications.  Based on these measurements, the annulus would be suitable for a 29 mm Sapien 3 valve.  Sinus of Valsalva Measurements:  Non-coronary:  36 mm  Right - coronary:  36 mm  Left - coronary:  36 mm  Sinus of Valsalva Height:  Left: 20.4 mm  Right:22.4 mm  Aorta: Conventional 3 vessel branch pattern of aortic arch. No coarctation of the aorta. Minimal aortic atherosclerosis.  Sinotubular Junction:  30 mm  Ascending Thoracic Aorta:  35 mm  Aortic Arch:  29 mm  Descending Thoracic Aorta:  27 mm  Coronary Artery  Height above Annulus:  Left Main: 15.1 mm  Right Coronary: 18.4 mm  Coronary Arteries: Coronary calcium score 96, 49th percentile for age and sex matched peers.  Optimum Fluoroscopic Angle for Delivery: LAO 3 CAU 3  IMPRESSION: 1. Bicuspid aortic valve with partial fusion of left and right coronary cusps and raphe present. Severely reduced cusp separation. Severely thickened, severely calcified aortic valve cusps.  2.  AV calcium score: 2886  3.  Annulus area 559 mm2, suitable for 29 mm Sapien 3 valve.  4.  Sufficient coronary artery height from annulus.  5.  Normal caliber ascending thoracic aorta.  6. Optimum Fluoroscopic Angle for Delivery: LAO 3 CAU 3   Electronically Signed   By: Cherlynn Kaiser   On: 08/22/2020 13:17   Addended by  Elouise Munroe, MD on 08/22/2020 1:19 PM    Study Result  Narrative & Impression  EXAM: OVER-READ INTERPRETATION  CT CHEST  The following report is an over-read performed by radiologist Dr. Vinnie Langton of [facility], [place] on 08/20/2020. This over-read does not include interpretation of cardiac or coronary anatomy or pathology. The coronary calcium score/coronary CTA interpretation by the cardiologist is attached.  COMPARISON:  None.  FINDINGS: Extracardiac findings will be described separately under dictation for contemporaneously obtained CTA chest, abdomen and pelvis.  IMPRESSION: Please see separate dictation for contemporaneously obtained CTA chest, abdomen and pelvis dated 08/20/2020 for full description of relevant extracardiac findings.  Electronically Signed: By: Vinnie Langton M.D. On: 08/20/2020 11:26     Narrative & Impression  CLINICAL DATA:  65 year old male with history of aortic stenosis. Preprocedural study prior to potential transcatheter aortic valve replacement (TAVR) procedure.  EXAM: CT ANGIOGRAPHY CHEST, ABDOMEN AND PELVIS  TECHNIQUE: Multidetector CT imaging through the chest, abdomen and pelvis was performed using the standard protocol during bolus administration of intravenous contrast. Multiplanar reconstructed images and MIPs were obtained and reviewed to evaluate the vascular anatomy.  CONTRAST:  171m OMNIPAQUE IOHEXOL 350 MG/ML SOLN  COMPARISON:  No prior chest CT.  CT of the abdomen 12/18/2016.  FINDINGS: CTA CHEST FINDINGS  Cardiovascular: Heart size is normal. There is no significant pericardial fluid, thickening or pericardial calcification. There is aortic atherosclerosis, as well as atherosclerosis of the great vessels of the mediastinum and the coronary arteries, including calcified atherosclerotic plaque in the left anterior descending and right coronary arteries. Severe thickening  calcification of the aortic valve.  Mediastinum/Lymph Nodes: No pathologically enlarged mediastinal or hilar lymph nodes. Esophagus is unremarkable in appearance. No axillary lymphadenopathy.  Lungs/Pleura: No suspicious appearing pulmonary nodules or masses are noted. No acute consolidative airspace disease. No pleural effusions.  Musculoskeletal/Soft Tissues: There are no aggressive appearing lytic or blastic lesions noted in the visualized portions of the skeleton.  CTA ABDOMEN AND PELVIS FINDINGS  Hepatobiliary: In the right lobe of the liver there is a 2.3 x 1.5 cm hypervascular lesion (axial image 116 of series 4), incompletely characterized. No other suspicious appearing cystic or solid hepatic lesions. No intra or extrahepatic biliary ductal dilatation. Gallbladder is normal in appearance.  Pancreas: No pancreatic mass. No pancreatic ductal dilatation. No pancreatic or peripancreatic fluid collections or inflammatory changes.  Spleen: Unremarkable.  Adrenals/Urinary Tract: Bilateral kidneys and the right adrenal gland are normal in appearance. 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 No hydroureteronephrosis. Urinary bladder is normal in appearance.  Stomach/Bowel: The appearance of  the stomach is normal. There is no pathologic dilatation of small bowel or colon. Several colonic diverticulae are noted, without surrounding inflammatory changes to suggest an acute diverticulitis at this time. Appendicoliths at the neck of the appendix. Appendix is otherwise normal in appearance.  Vascular/Lymphatic: Minimal atherosclerotic calcifications in the abdominal aorta, without evidence of aneurysm or dissection in the abdominal or pelvic vasculature. Vascular findings and measurements pertinent to potential TAVR procedure, as  detailed below. No lymphadenopathy noted in the abdomen or pelvis.  Reproductive: Prostate gland and seminal vesicles are unremarkable in appearance.  Other: No significant volume of ascites.  No pneumoperitoneum.  Musculoskeletal: There are no aggressive appearing lytic or blastic lesions noted in the visualized portions of the skeleton.  VASCULAR MEASUREMENTS PERTINENT TO TAVR:  AORTA:  Minimal Aortic Diameter-19 x 17 mm  Severity of Aortic Calcification-minimal  RIGHT PELVIS:  Right Common Iliac Artery -  Minimal Diameter-12.5 x 12.1 mm  Tortuosity-mild  Calcification-none  Right External Iliac Artery -  Minimal Diameter-9.9 x 10.2 mm  Tortuosity-severe  Calcification-none  Right Common Femoral Artery -  Minimal Diameter-9.5 x 9.7 mm  Tortuosity-mild  Calcification-none  LEFT PELVIS:  Left Common Iliac Artery -  Minimal Diameter-11.6 x 11.4 mm  Tortuosity-mild  Calcification-none  Left External Iliac Artery -  Minimal Diameter-10.4 x 10.4 mm  Tortuosity-moderate  Calcification-none  Left Common Femoral Artery -  Minimal Diameter-10.7 x 9.9 mm  Tortuosity-mild  Calcification-none  Review of the MIP images confirms the above findings.  IMPRESSION: 1. Vascular findings and measurements pertinent to potential TAVR procedure, as detailed above. 2. Severe thickening calcification of the aortic valve, compatible with reported clinical history of severe aortic stenosis. 3. Aortic atherosclerosis, in addition to 2 vessel coronary artery disease. Please note that although the presence of coronary artery calcium documents the presence of coronary artery disease, the severity of this disease and any potential stenosis cannot be assessed on this non-gated CT examination. Assessment for potential risk factor modification, dietary therapy or pharmacologic therapy may be warranted, if clinically indicated. 4.  2.3 x 1.5 cm hypervascular lesion in the right lobe of the liver, not evident on prior examination from 2018. This may represent a benign perfusion anomaly or flash fill cavernous hemangioma, however, the possibility of an aggressive hypervascular lesion is not excluded. Definitive characterization with follow-up nonemergent abdominal MRI with and without IV gadolinium is recommended in the near future to better evaluate this finding. 5. Colonic diverticulosis without evidence of acute diverticulitis at this time. 6. Appendicoliths at the neck of the appendix. Currently, the appendix is otherwise normal in appearance. 7. 1.5 cm left adrenal nodule, similar in retrospect to remote prior study from 2018, favored to represent a benign lesions such as a lipid poor adenoma. Attention at time of forthcoming abdominal MRI is recommended for additional characterization.   Electronically Signed   By: Vinnie Langton M.D.   On: 08/20/2020 13:20    [facility] Adult Cardiac Surgery Database Version 4.20 RISK SCORES Procedure: Isolated AVR Risk of Mortality: 0.922% Renal Failure: 1.713% Permanent Stroke: 0.780% Prolonged Ventilation: 3.379% DSW Infection: 0.085% Reoperation: 3.459% Morbidity or Mortality: 6.387% Short Length of Stay: 59.499% Long Length of Stay: 1.956%  Impression:  This 65 year old gentleman has stage D, severe, symptomatic bicuspid aortic valve stenosis with New York Heart Association class I-II symptoms consistent with chronic diastolic congestive heart failure.  He reports some chest tightness at rest as well as with exertion and has recently noticed decreased exertional tolerance.  He has also had episodes of positional dizziness when standing up or bending over.  I have personally reviewed his 2D echocardiogram, cardiac catheterization, and CTA studies.  His echocardiogram shows a bicuspid aortic valve with severe leaflet calcification and restricted mobility.   His mean gradient is 41 mmHg consistent with severe aortic stenosis.  Left ventricular ejection fraction is 55 to 60% with severe concentric LVH and grade 1 diastolic dysfunction.  Cardiac catheterization shows no coronary disease.  The peak to peak gradient was measured at 44 mmHg with a mean gradient of 34 mmHg.  I agree that aortic valve replacement is indicated to prevent progressive left ventricular deterioration.  He already has severe concentric LVH.  His symptoms are still relatively mild although he has noted a recent decrease in his exertional tolerance.  The patient and his wife for counseled at length regarding treatment alternatives for management of severe symptomatic aortic stenosis. The risks and benefits of surgical intervention has been discussed in detail. Long-term prognosis with medical therapy was discussed. Alternative approaches such as conventional surgical aortic valve replacement, transcatheter aortic valve replacement, and palliative medical therapy were compared and contrasted at length. This discussion was placed in the context of the patient's own specific clinical presentation and past medical history. All of their questions have been addressed.  He said that he is not interested in open surgery because multiple family members have had open heart surgery previously and had a tough time with it.  I think he is a reasonable candidate for TAVR although I stressed the risk of structural valve deterioration and the fact that we do not have a good long-term data on durability with these valves.  He understands would like to proceed with TAVR if that is an option.  Following the decision to proceed with transcatheter aortic valve replacement, a discussion was held regarding what types of management strategies would be attempted intraoperatively in the event of life-threatening complications, including whether or not the patient would be considered a candidate for the use of  cardiopulmonary bypass and/or conversion to open sternotomy for attempted surgical intervention.  He would be a candidate for emergent sternotomy to manage any intraoperative complications.  The patient is aware of the fact that transient use of cardiopulmonary bypass may be necessary. The patient has been advised of a variety of complications that might develop including but not limited to risks of death, stroke, paravalvular leak, aortic dissection or other major vascular complications, aortic annulus rupture, device embolization, cardiac rupture or perforation, mitral regurgitation, acute myocardial infarction, arrhythmia, heart block or bradycardia requiring permanent pacemaker placement, congestive heart failure, respiratory failure, renal failure, pneumonia, infection, other late complications related to structural valve deterioration or migration, or other complications that might ultimately cause a temporary or permanent loss of functional independence or other long term morbidity. The patient provides full informed consent for the procedure as described and all questions were answered.     Plan:  He would like to proceed with transfemoral transcatheter aortic valve replacement but feels like next week would be too soon for him.  I told him that our team will be in contact with him to schedule this when it would be best for him.  I spent 60 minutes performing this consultation and > 50% of this time was spent face to face counseling and coordinating the care of this patient's severe symptomatic aortic stenosis.     Gaye Pollack, MD 10/09/2020

## 2020-10-14 ENCOUNTER — Other Ambulatory Visit: Payer: Self-pay | Admitting: Endocrinology

## 2020-10-22 ENCOUNTER — Encounter: Payer: Self-pay | Admitting: Internal Medicine

## 2020-10-22 ENCOUNTER — Other Ambulatory Visit: Payer: Self-pay

## 2020-10-22 ENCOUNTER — Ambulatory Visit (INDEPENDENT_AMBULATORY_CARE_PROVIDER_SITE_OTHER): Payer: Medicare Other | Admitting: Internal Medicine

## 2020-10-22 VITALS — BP 132/76 | HR 89 | Ht 71.0 in | Wt 222.4 lb

## 2020-10-22 DIAGNOSIS — E1169 Type 2 diabetes mellitus with other specified complication: Secondary | ICD-10-CM | POA: Diagnosis not present

## 2020-10-22 DIAGNOSIS — I10 Essential (primary) hypertension: Secondary | ICD-10-CM

## 2020-10-22 DIAGNOSIS — E785 Hyperlipidemia, unspecified: Secondary | ICD-10-CM

## 2020-10-22 DIAGNOSIS — I350 Nonrheumatic aortic (valve) stenosis: Secondary | ICD-10-CM

## 2020-10-22 NOTE — Patient Instructions (Signed)

## 2020-10-22 NOTE — Progress Notes (Signed)
Cardiology Office Note:    Date:  10/22/2020   ID:  WADELL CRADDOCK, DOB 22-Oct-1955, MRN [id number]  PCP:  Ria Bush, MD  Cardiologist:  None  Electrophysiologist:  None   Referring MD: Ria Bush, MD   Chief Complaint/Reason for Referral: Severe aortic valve stenosis.  History of Present Illness:    Evan [name] is a 65 y.o. male with a history of depression, hyperlipidemia, hypertension, diabetes well controlled, nonalcoholic fatty liver disease, who presents today for evaluation of systolic murmur found to have aortic valve stenosis, now felt to be severe by most recent echo.   He notes more of an ache in his chest, and dizziness.  This has been worsening over the last month.  He has severe bicuspid aortic valve stenosis anticipating TAVR next week.  Today, he is feeling more chest pressure since last month, and is currently feeling minor fatigue. He states his dizziness seems to have lessened in the last week. He denies any shortness of breath, or palpitations. No pre-syncope or syncope to note. Also has no lower extremity edema, orthopnea or PND.  We discussed in detail that Theodosia Quay, RN will call him preprocedurally for TAVR medication recommendations.  He will also have TAVR team follow-ups shortly after implantation coordinated by Nell Range, PA.  Past Medical History:  Diagnosis Date  . Aortic stenosis   . COVID-19 virus infection 05/2020  . Depression    intolerant of cymbalta and wellbutrin  . Diverticulosis of colon   . Esophagitis 03/2015   by EGD  . Gastritis 03/2015   by EGD - mild, chronic  . Hyperlipidemia   . Hypertension   . Lichen simplex chronicus 05/2015   R scrotum [name])  . Murmur    aortic sclerosis, SBE  prophlaxis not needed as per cardio  . NAFLD (nonalcoholic fatty liver disease) 01/2015   by [place]  . 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 T2DM (type 2 diabetes mellitus) ([place])    DSME 09/2013    Past  Surgical History:  Procedure Laterality Date  . COLONOSCOPY  2006   diverticulosis per pt  . COLONOSCOPY  03/2015   2 polyps, rpt 5 yrs [name])  . ESOPHAGOGASTRODUODENOSCOPY  03/2015   mild chronic gastritis, esophagitis   . LUMBAR LAMINECTOMY  1989   L5-S1  . RIGHT/LEFT HEART CATH AND CORONARY ANGIOGRAPHY N/A 08/15/2020   Procedure: RIGHT/LEFT HEART CATH AND CORONARY ANGIOGRAPHY;  Surgeon: Burnell Blanks, MD;  Location: [facility];  Service: Cardiovascular;  Laterality: N/A;    Current Medications: Current Meds  Medication Sig  . aspirin EC 81 MG tablet Take 81 mg by mouth every evening. Swallow whole.  . Blood Glucose Monitoring Suppl (ONETOUCH VERIO FLEX SYSTEM) w/Device KIT 1 kit by Does not apply route daily. Use to check blood sugars 2 times daily  . cephALEXin (KEFLEX) 500 MG capsule TAKE FOUR TABLETS BY MOUTH 60 MINUTES PRIOR TO ANY DENTAL APPOINTMENTS  . Cholecalciferol (VITAMIN D3) 50 MCG (2000 UT) TABS Take 2,000 Units by mouth daily.  . Cyanocobalamin (VITAMIN B-12) 2500 MCG SUBL Take 2,500 mcg by mouth daily.  Marland Kitchen FARXIGA 10 MG TABS tablet TAKE 1 TABLET (10 MG TOTAL) BY MOUTH EVERY MORNING. TAKE 1 TABLET BY MOUTH EVERY MORNING.  . fluticasone (FLONASE) 50 MCG/ACT nasal spray Place 1 spray into both nostrils daily as needed for allergies or rhinitis.  Marland Kitchen ibuprofen (ADVIL,MOTRIN) 200 MG tablet Take 400 mg by mouth  every 8 (eight) hours as needed (PAIN).  Marland Kitchen losartan (COZAAR) 100 MG tablet Take 50 mg by mouth daily.  . Magnesium 250 MG TABS Take 250 mg by mouth every evening.  . metFORMIN (GLUCOPHAGE) 1000 MG tablet TAKE 1 TABLET (1,000 MG TOTAL) BY MOUTH 2 TIMES DAILY WITH A MEAL.  . metoprolol tartrate (LOPRESSOR) 50 MG tablet Take one tablet by mouth as directed prior to 3/8 scan  . omeprazole (PRILOSEC) 20 MG capsule Take 1 capsule (20 mg total) by mouth daily as needed.  Glory Rosebush VERIO test strip USE TO CHECK BLOOD SUGARS 2 TIMES DAILY  . rosuvastatin  (CRESTOR) 5 MG tablet Take 1 tablet (5 mg total) by mouth daily.  . Semaglutide, 1 MG/DOSE, (OZEMPIC, 1 MG/DOSE,) 4 MG/3ML SOPN Inject 1 mg as directed once a week.  . sildenafil (REVATIO) 20 MG tablet Take 2-5 tablets (40-100 mg total) by mouth daily as needed (relations).  . TURMERIC PO Take 1,800 mg by mouth every evening.  . Vitamin D-Vitamin K (K2 PLUS D3 PO) Take 1 tablet by mouth every evening.  . Zinc 50 MG TABS Take 50 mg by mouth every evening.     Allergies:   Penicillins   Social History   Tobacco Use  . Smoking status: Never Smoker  . Smokeless tobacco: Never Used  Substance Use Topics  . Alcohol use: Yes    Alcohol/week: 0.0 standard drinks    Comment: occasional  . Drug use: No     Family History: The patient's family history includes Alcohol abuse in his brother and mother; Alzheimer's disease in his father; CAD in his brother; Coronary artery disease in his maternal uncle; Diabetes in his brother and maternal grandfather; Emphysema in his mother; Hyperlipidemia in his father; Hypertension in his father; Prostate cancer in his brother; Schizophrenia in his paternal uncle; Stroke in his father, maternal grandfather, maternal grandmother, paternal grandfather, and paternal grandmother.  ROS:   Please see the history of present illness.    (+) Chest pressure (+) Dizziness (+) Fatigue All other systems reviewed and are negative.  EKGs/Labs/Other Studies Reviewed:    The following studies were reviewed today:  Vas US Carotid Duplex Bilateral 09/25/2020: Right Carotid: The extracranial vessels were near-normal with only minimal  wall         thickening or plaque.   Left Carotid: The extracranial vessels were near-normal with only minimal  wall        thickening or plaque.   Vertebrals: Bilateral vertebral arteries demonstrate antegrade flow.  Subclavians: Normal flow hemodynamics were seen in bilateral subclavian         arteries.  Right/Left Heart Cath 08/15/2020: 1. No angiographic evidence of CAD 2. Severe aortic stenosis by echo with likely bicuspid aortic valve. Cath with mean gradient 34 mmHg, peak to peak gradient 44 mmHg.   Recommendations: Will continue workup for AVR vs TAVR. Will plan CT scans and then will review with valve team. At this time he is relatively asymptomatic and we may be able to follow his aortic stenosis for now.    Echo 07/11/2020: 1. Aortic valve is bicuspid or functionally bicuspid. Aortic stenosis is  severe with peak/mean transaortic gradients 68/41 mmHg.  2. Left ventricular ejection fraction, by estimation, is 55 to 60%. The  left ventricle has normal function. The left ventricle has no regional  wall motion abnormalities. There is severe concentric left ventricular  hypertrophy. Left ventricular diastolic  parameters are consistent with Grade I diastolic dysfunction (  impaired  relaxation). Elevated left atrial pressure.  3. Right ventricular systolic function is normal. The right ventricular  size is normal. There is normal pulmonary artery systolic pressure. The  estimated right ventricular systolic pressure is 62.5 mmHg.  4. Left atrial size was mildly dilated.  5. The mitral valve is normal in structure. Trivial mitral valve  regurgitation. No evidence of mitral stenosis.  6. The aortic valve is bicuspid. There is severe calcifcation of the  aortic valve. There is severe thickening of the aortic valve. Aortic valve  regurgitation is mild. Severe aortic valve stenosis. Aortic valve mean  gradient measures 41.0 mmHg.  7. The inferior vena cava is normal in size with greater than 50%  respiratory variability, suggesting right atrial pressure of 3 mmHg.   EKG:  10/22/2020: EKG was not ordered today.  07/25/2020: NSR  Recent Labs: 07/11/2020: ALT 18 08/02/2020: Platelets 264 08/15/2020: Hemoglobin 15.0; Hemoglobin 14.6 09/16/2020: BUN 15; Creatinine, Ser 1.00;  Potassium 4.3; Sodium 140  Recent Lipid Panel    Component Value Date/Time   CHOL 165 03/11/2020 0907   TRIG 59.0 03/11/2020 0907   HDL 82.50 03/11/2020 0907   CHOLHDL 2 03/11/2020 0907   VLDL 11.8 03/11/2020 0907   LDLCALC 71 03/11/2020 0907   LDLDIRECT 99.0 05/20/2015 0955    Physical Exam:    VS:  BP 132/76   Pulse 89   Ht _0  (1.803 m)   Wt 222 lb 6.4 oz (100.9 kg)   SpO2 97%   BMI 31.02 kg/m     Wt Readings from Last 5 Encounters:  10/22/20 222 lb 6.4 oz (100.9 kg)  09/16/20 218 lb 6.4 oz (99.1 kg)  08/15/20 215 lb (97.5 kg)  08/02/20 225 lb 3.2 oz (102.2 kg)  07/25/20 218 lb 12.8 oz (99.2 kg)    Constitutional: No acute distress Eyes: sclera non-icteric, normal conjunctiva and lids ENMT: normal dentition, moist mucous membranes Cardiovascular: regular rhythm, normal rate, 3/6 SEM, late peaking, S2 muffled. Radial pulses normal bilaterally. No jugular venous distention.  Respiratory: clear to auscultation bilaterally GI : normal bowel sounds, soft and nontender. No distention.   MSK: extremities warm, well perfused. No edema.  NEURO: grossly nonfocal exam, moves all extremities. PSYCH: alert and oriented x 3, normal mood and affect.   ASSESSMENT:    1. Severe aortic stenosis   2. Essential hypertension   3. Hyperlipidemia associated with type 2 diabetes mellitus (HCC)    PLAN:    Severe aortic valve stenosis - -anticipating TAVR on 10/29/2020.  He feels more symptomatic in the last few weeks with chest pressure and dizziness, would recommend continuing plan for TAVR next week. -Can continue aspirin and statin at this time  Essential hypertension -  Hyperlipidemia associated with type 2 diabetes mellitus ([place])  -Blood pressure is overall stable, would continue losartan 100 mg daily  Hyperlipidemia -Continue Crestor 5 mg daily.   Total time of encounter: 20 minutes total time of encounter, including 15 minutes spent in face-to-face patient care on the  date of this encounter. This time includes coordination of care and counseling regarding above mentioned problem list. Remainder of non-face-to-face time involved reviewing chart documents/testing relevant to the patient encounter and documentation in the medical record. I have independently reviewed documentation from referring provider.   Cherlynn Kaiser, MD [place]  [facility]    Medication Adjustments/Labs and Tests Ordered: Current medicines are reviewed at length with the patient today.  Concerns regarding medicines are outlined above.  No orders of the defined types were placed in this encounter.   No orders of the defined types were placed in this encounter.   Patient Instructions  Medication Instructions:  No Changes In Medications at this time.  *If you need a refill on your cardiac medications before your next appointment, please call your pharmacy*  Follow-Up: At [facility], you and your health needs are our priority.  As part of our continuing mission to provide you with exceptional heart care, we have created designated Provider Care Teams.  These Care Teams include your primary Cardiologist (physician) and Advanced Practice Providers (APPs -  Physician Assistants and Nurse Practitioners) who all work together to provide you with the care you need, when you need it.  Your next appointment:   6 month(s)  The format for your next appointment:   In Person  Provider:   Cherlynn Kaiser, MD       [facility],acting as a scribe for Elouise Munroe, MD.,have documented all relevant documentation on the behalf of Elouise Munroe, MD,as directed by  Elouise Munroe, MD while in the presence of Elouise Munroe, MD.  I, Elouise Munroe, MD, have reviewed all documentation for this visit. The documentation on 10/22/20 for the exam, diagnosis, procedures, and orders are all accurate and complete.

## 2020-10-24 ENCOUNTER — Other Ambulatory Visit: Payer: Self-pay

## 2020-10-24 DIAGNOSIS — I350 Nonrheumatic aortic (valve) stenosis: Secondary | ICD-10-CM

## 2020-10-24 NOTE — Telephone Encounter (Signed)
Spoke with pt on the phone regarding progression of symptoms. Pt feels like his chest pain and pressure is getting worst as well as shortness of breath and lightheadedness.  Pt states that his resting heart rate was in the mid 60s and now it is starting to increase to the mid 80s.  Pt would also like to know when to go to the ED, explained to pt ED precautions. Pt verbalizes understanding.  Will forward to Dr. Margaretann Loveless to further advise.

## 2020-10-25 ENCOUNTER — Other Ambulatory Visit (HOSPITAL_COMMUNITY): Payer: Medicare Other

## 2020-10-25 ENCOUNTER — Encounter (HOSPITAL_COMMUNITY): Payer: Self-pay | Admitting: *Deleted

## 2020-10-25 ENCOUNTER — Emergency Department (HOSPITAL_COMMUNITY): Payer: Medicare Other

## 2020-10-25 ENCOUNTER — Other Ambulatory Visit: Payer: Self-pay

## 2020-10-25 ENCOUNTER — Emergency Department (HOSPITAL_COMMUNITY)
Admission: EM | Admit: 2020-10-25 | Discharge: 2020-10-25 | Disposition: A | Discharge status: Home / Self Care | Payer: Medicare Other | Attending: Emergency Medicine | Admitting: Emergency Medicine

## 2020-10-25 ENCOUNTER — Telehealth: Payer: Self-pay

## 2020-10-25 DIAGNOSIS — R079 Chest pain, unspecified: Secondary | ICD-10-CM | POA: Diagnosis not present

## 2020-10-25 DIAGNOSIS — Q231 Congenital insufficiency of aortic valve: Secondary | ICD-10-CM | POA: Diagnosis not present

## 2020-10-25 DIAGNOSIS — Q230 Congenital stenosis of aortic valve: Secondary | ICD-10-CM

## 2020-10-25 DIAGNOSIS — E1169 Type 2 diabetes mellitus with other specified complication: Secondary | ICD-10-CM | POA: Diagnosis not present

## 2020-10-25 DIAGNOSIS — Z7982 Long term (current) use of aspirin: Secondary | ICD-10-CM | POA: Insufficient documentation

## 2020-10-25 DIAGNOSIS — E785 Hyperlipidemia, unspecified: Secondary | ICD-10-CM | POA: Insufficient documentation

## 2020-10-25 DIAGNOSIS — I350 Nonrheumatic aortic (valve) stenosis: Secondary | ICD-10-CM

## 2020-10-25 DIAGNOSIS — I10 Essential (primary) hypertension: Secondary | ICD-10-CM | POA: Diagnosis not present

## 2020-10-25 DIAGNOSIS — Z7984 Long term (current) use of oral hypoglycemic drugs: Secondary | ICD-10-CM | POA: Diagnosis not present

## 2020-10-25 DIAGNOSIS — Z79899 Other long term (current) drug therapy: Secondary | ICD-10-CM | POA: Insufficient documentation

## 2020-10-25 DIAGNOSIS — Z954 Presence of other heart-valve replacement: Secondary | ICD-10-CM | POA: Insufficient documentation

## 2020-10-25 DIAGNOSIS — Z8616 Personal history of COVID-19: Secondary | ICD-10-CM | POA: Diagnosis not present

## 2020-10-25 DIAGNOSIS — Z20822 Contact with and (suspected) exposure to covid-19: Secondary | ICD-10-CM | POA: Insufficient documentation

## 2020-10-25 DIAGNOSIS — R0789 Other chest pain: Secondary | ICD-10-CM | POA: Diagnosis not present

## 2020-10-25 HISTORY — DX: Cardiomegaly: I51.7

## 2020-10-25 HISTORY — DX: Liver disease, unspecified: K76.9

## 2020-10-25 HISTORY — DX: Benign neoplasm of unspecified adrenal gland: D35.00

## 2020-10-25 LAB — RESP PANEL BY RT-PCR (FLU A&B, COVID) ARPGX2
Influenza A by PCR: NEGATIVE
Influenza B by PCR: NEGATIVE
SARS Coronavirus 2 by RT PCR: NEGATIVE

## 2020-10-25 LAB — CBC
HCT: 44.9 % (ref 39.0–52.0)
Hemoglobin: 15.2 g/dL (ref 13.0–17.0)
MCH: 32.1 pg (ref 26.0–34.0)
MCHC: 33.9 g/dL (ref 30.0–36.0)
MCV: 94.9 fL (ref 80.0–100.0)
Platelets: 265 10*3/uL (ref 150–400)
RBC: 4.73 MIL/uL (ref 4.22–5.81)
RDW: 14.6 % (ref 11.5–15.5)
WBC: 8.5 10*3/uL (ref 4.0–10.5)
nRBC: 0 % (ref 0.0–0.2)

## 2020-10-25 LAB — BASIC METABOLIC PANEL
Anion gap: 9 (ref 5–15)
BUN: 13 mg/dL (ref 8–23)
CO2: 26 mmol/L (ref 22–32)
Calcium: 9 mg/dL (ref 8.9–10.3)
Chloride: 99 mmol/L (ref 98–111)
Creatinine, Ser: 0.88 mg/dL (ref 0.61–1.24)
GFR, Estimated: >60 mL/min (ref >60)
Glucose, Bld: 114 mg/dL — ABNORMAL HIGH (ref 70–99)
Potassium: 4.3 mmol/L (ref 3.5–5.1)
Sodium: 134 mmol/L — ABNORMAL LOW (ref 135–145)

## 2020-10-25 LAB — TROPONIN I (HIGH SENSITIVITY)
Troponin I (High Sensitivity): 5 ng/L (ref <18)
Troponin I (High Sensitivity): 5 ng/L (ref <18)

## 2020-10-25 NOTE — Telephone Encounter (Signed)
  [facility]   MULTIDISCIPLINARY HEART VALVE TEAM  Pt was scheduled for TAVR on 11/05/20 and due to escalating symptoms I requested that our team accommodate a 5th TAVR case on 10/29/20.  Team can accommodate and I contacted the pt to make him aware that surgery will be moved up to 10/29/20.  During my discussion with the pt by phone he is noticeably short of breath (which is new) and he only feels like he is getting any air when he "pants:  The pt also said that his chest is hurting and the discomfort is not going away. I advised the pt that he needs to proceed to the [facility] ER immediately for evaluation and pt agreed with plan.

## 2020-10-25 NOTE — ED Provider Notes (Addendum)
[facility] EMERGENCY DEPARTMENT Provider Note   CSN: [id number] Arrival date & time: 10/25/20  0240     History Chief Complaint  Patient presents with  . Chest Pain    Evan [name] is a 65 y.o. male.  The history is provided by the patient and the spouse.  Chest Pain Pain location:  L chest Pain radiates to:  Does not radiate Pain severity:  Moderate Onset quality:  Unable to specify Duration:  1 month Timing:  Constant Progression:  Worsening Relieved by:  Nothing Ineffective treatments:  None tried  Pt is scheduled to a aortic valve replacement on Tuesday 5/17.  Pt reports the pain has been getting worse.  Pt called cardiology today and was advised to come in to be seen     Past Medical History:  Diagnosis Date  . Aortic stenosis   . COVID-19 virus infection 05/2020  . Depression    intolerant of cymbalta and wellbutrin  . Diverticulosis of colon   . Esophagitis 03/2015   by EGD  . Gastritis 03/2015   by EGD - mild, chronic  . Hyperlipidemia   . Hypertension   . Lichen simplex chronicus 05/2015   R scrotum [name])  . Murmur    aortic sclerosis, SBE  prophlaxis not needed as per cardio  . NAFLD (nonalcoholic fatty liver disease) 01/2015   by [place]  . 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 T2DM (type 2 diabetes mellitus) ([place])    DSME 09/2013    Patient Active Problem List   Diagnosis Date Noted  . Rising PSA level 08/20/2020  . Severe aortic stenosis   . Family history of prostate cancer 06/29/2020  . Medicare annual wellness visit, initial 12/21/2018  . Advanced care planning/counseling discussion 12/21/2018  . Chronic pain of left knee 10/07/2017  . Seasonal allergic rhinitis 10/07/2017  . Aortic atherosclerosis ([place]) 12/19/2016  . Hepatic steatosis 12/19/2016  . Carotid stenosis 04/04/2016  . OSA (obstructive sleep apnea) 02/18/2016  . Continuous RUQ abdominal pain 02/18/2016  . Chronic cough 09/18/2015  .  Moderate aortic stenosis by prior echocardiogram 05/20/2015  . Obesity, Class I, BMI 30-34.9 07/27/2014  . Lichen simplex chronicus 03/26/2014  . Chronic pain of right knee 10/09/2013  . GERD (gastroesophageal reflux disease) 12/22/2012  . Ophthalmic migraine   . Controlled diabetes mellitus type 2 with complications ([place]) 97/35/3299  . Hyperlipidemia associated with type 2 diabetes mellitus ([place]) 12/03/2009  . MDD (major depressive disorder), recurrent, in full remission ([place]) 12/03/2009  . Essential hypertension 12/03/2009  . DIVERTICULOSIS, COLON 12/03/2009  . 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 SHOULDER IMPINGEMENT SYNDROME, RIGHT 12/03/2009    Past Surgical History:  Procedure Laterality Date  . COLONOSCOPY  2006   diverticulosis per pt  . COLONOSCOPY  03/2015   2 polyps, rpt 5 yrs [name])  . ESOPHAGOGASTRODUODENOSCOPY  03/2015   mild chronic gastritis, esophagitis   . LUMBAR LAMINECTOMY  1989   L5-S1  . RIGHT/LEFT HEART CATH AND CORONARY ANGIOGRAPHY N/A 08/15/2020   Procedure: RIGHT/LEFT HEART CATH AND CORONARY ANGIOGRAPHY;  Surgeon: Burnell Blanks, MD;  Location: [facility];  Service: Cardiovascular;  Laterality: N/A;       Family History  Problem Relation Age of Onset  . Alzheimer's disease Father   . Hypertension Father   . Stroke Father   . Hyperlipidemia Father   . Prostate cancer Brother   . Diabetes Brother   . Alcohol  abuse Brother   . CAD Brother   . Coronary artery disease Maternal Uncle   . Schizophrenia Paternal Uncle   . Emphysema Mother   . Alcohol abuse Mother   . Stroke Maternal Grandmother   . Stroke Maternal Grandfather   . Diabetes Maternal Grandfather   . Stroke Paternal Grandmother   . Stroke Paternal Grandfather     Social History   Tobacco Use  . Smoking status: Never Smoker  . Smokeless tobacco: Never Used  Substance Use Topics  . Alcohol use: Not Currently    Alcohol/week: 0.0 standard drinks    Comment:  occasional  . Drug use: No    Home Medications Prior to Admission medications   Medication Sig Start Date End Date Taking? Authorizing Provider  aspirin EC 81 MG tablet Take 81 mg by mouth every evening. Swallow whole.    [provider]  Blood Glucose Monitoring Suppl ([place]) w/Device KIT 1 kit by Does not apply route daily. Use to check blood sugars 2 times daily 01/27/17   Elayne Snare, MD  cephALEXin (KEFLEX) 500 MG capsule TAKE FOUR TABLETS BY MOUTH 60 MINUTES PRIOR TO ANY DENTAL APPOINTMENTS Patient taking differently: Take 2,000 mg by mouth See admin instructions. TAKE 2000 MG BY MOUTH 60 MINUTES PRIOR TO ANY DENTAL APPOINTMENTS 09/16/20   Burnell Blanks, MD  Cholecalciferol (VITAMIN D3) 50 MCG (2000 UT) TABS Take 2,000 Units by mouth daily.    [provider]  Cyanocobalamin (VITAMIN B-12) 2500 MCG SUBL Take 2,500 mcg by mouth daily.    [provider]  FARXIGA 10 MG TABS tablet TAKE 1 TABLET (10 MG TOTAL) BY MOUTH EVERY MORNING. TAKE 1 TABLET BY MOUTH EVERY MORNING. Patient taking differently: Take 5 mg by mouth daily. 05/03/20   Elayne Snare, MD  fluticasone (FLONASE) 50 MCG/ACT nasal spray Place 1 spray into both nostrils daily as needed for allergies or rhinitis.    [provider]  ibuprofen (ADVIL,MOTRIN) 200 MG tablet Take 400 mg by mouth every 8 (eight) hours as needed for moderate pain.    [provider]  losartan (COZAAR) 100 MG tablet Take 50 mg by mouth daily.    [provider]  Magnesium 250 MG TABS Take 250 mg by mouth every evening.    [provider]  metFORMIN (GLUCOPHAGE) 1000 MG tablet TAKE 1 TABLET (1,000 MG TOTAL) BY MOUTH 2 TIMES DAILY WITH A MEAL. Patient taking differently: Take 1,000 mg by mouth every evening. 10/14/20   Elayne Snare, MD  metoprolol tartrate (LOPRESSOR) 50 MG tablet Take one tablet by mouth as directed prior to 3/8 scan Patient not taking: Reported on  10/24/2020 08/16/20   Burnell Blanks, MD  omeprazole (PRILOSEC) 20 MG capsule Take 1 capsule (20 mg total) by mouth daily as needed. 12/25/19   Ria Bush, MD  [facility] VERIO test strip USE TO CHECK BLOOD SUGARS 2 TIMES DAILY 08/30/19   Elayne Snare, MD  rosuvastatin (CRESTOR) 5 MG tablet Take 1 tablet (5 mg total) by mouth daily. 12/25/19   Ria Bush, MD  Semaglutide, 1 MG/DOSE, (OZEMPIC, 1 MG/DOSE,) 4 MG/3ML SOPN Inject 1 mg as directed once a week. Patient taking differently: Inject 0.5 mg as directed every Monday. 06/28/20   Elayne Snare, MD  sildenafil (REVATIO) 20 MG tablet Take 2-5 tablets (40-100 mg total) by mouth daily as needed (relations). 12/25/19   Ria Bush, MD  TURMERIC PO Take 1,800 mg by mouth every evening.  [provider]  Vitamin D-Vitamin K (K2 PLUS D3 PO) Take 1 tablet by mouth every evening.    [provider]  Zinc 50 MG TABS Take 50 mg by mouth every evening.    [provider]    Allergies    Penicillins  Review of Systems   Review of Systems  Cardiovascular: Positive for chest pain.  All other systems reviewed and are negative.   Physical Exam Updated Vital Signs BP 117/73   Pulse 79   Temp 98.7 F (37.1 C)   Resp (!) 33   Ht _0  (1.803 m)   Wt 97.5 kg   SpO2 100%   BMI 29.99 kg/m   Physical Exam Vitals and nursing note reviewed.  Constitutional:      Appearance: He is well-developed.  HENT:     Head: Normocephalic and atraumatic.  Eyes:     Conjunctiva/sclera: Conjunctivae normal.  Cardiovascular:     Rate and Rhythm: Normal rate and regular rhythm.     Heart sounds: Murmur heard.   Systolic murmur is present with a grade of 6/6.   Pulmonary:     Effort: Pulmonary effort is normal. No respiratory distress.     Breath sounds: Normal breath sounds.  Abdominal:     Palpations: Abdomen is soft.     Tenderness: There is no abdominal tenderness.  Musculoskeletal:        General: Normal  range of motion.     Cervical back: Neck supple.  Skin:    General: Skin is warm and dry.  Neurological:     General: No focal deficit present.     Mental Status: He is alert.  Psychiatric:        Mood and Affect: Mood normal.     ED Results / Procedures / Treatments   Labs (all labs ordered are listed, but only abnormal results are displayed) Labs Reviewed  BASIC METABOLIC PANEL  CBC  TROPONIN I (HIGH SENSITIVITY)    EKG None  Radiology DG Chest 2 View  Result Date: 10/25/2020 CLINICAL DATA:  Chest pain for 1 month worsened in past several days, planned AVR EXAM: CHEST - 2 VIEW COMPARISON:  07/26/2007 FINDINGS: Normal heart size, mediastinal contours, and pulmonary vascularity. Lungs clear. No infiltrate, pleural effusion, or pneumothorax. Osseous structures unremarkable. IMPRESSION: No acute abnormalities. Electronically Signed   By: Lavonia Dana M.D.   On: 10/25/2020 11:04    Procedures Procedures   Medications Ordered in ED Medications - No data to display  ED Course  I have reviewed the triage vital signs and the nursing notes.  Pertinent labs & imaging results that were available during my care of the patient were reviewed by me and considered in my medical decision making (see chart for details).    MDM Rules/Calculators/A&P                          MDM:  I spoke with Cardiology who will see here for evaluation.  Dr. Claiborne Billings saw and evaluated pt.  He feels pt can go home safely.  Pt is to return as scheduled for procedure Final Clinical Impression(s) / ED Diagnoses Final diagnoses:  Chest pain, unspecified type    Rx / DC Orders ED Discharge Orders    None       Sidney Ace 10/25/20 1233    Pattricia Boss, MD 10/25/20 1631    Fransico Meadow, PA-C 10/25/20 1706  Pattricia Boss, MD 10/27/20 [phone]

## 2020-10-25 NOTE — ED Triage Notes (Signed)
C/o chest pain onset 1 month ago states pain has gotten worse last several days states he is to have AVR 5/17

## 2020-10-25 NOTE — Consult Note (Addendum)
Cardiology Consultation   Patient ID: HURBERT DURAN MRN: [id number]; DOB: 12-12-1955   Admission date: 10/25/2020  PCP:  Ria Bush, MD   [facility] Providers Cardiologist:  None        Chief Complaint:  Chest pain  Patient Profile:   Evan [name] is a 65 y.o. 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 Sofia PA-C.  History of Present Illness:   Evan [name] established care in 11/2019 for murmur found to have bicuspid aortic valve with moderate aortic stenosis at the time. He had a repeat echo 6 months later (06/2020) showing severe calcification of the aortic valve leaflets, progression to severe AS with echo otherwise showing EF 55-60%, grade 1 DD, normal RV, mild LAE, mild AI. He followed up in 07/2020 at which time he was describing a persistent chest ache. Typically the longer he walked, the less his chest would hurt which was felt reassuring. However, given progression in his AS, he underwent workup with a R/LHC with no evidence of CAD, cath with mean gradient 70mHg and peak to peak gradient 465mg. He was seen by the structural heart team who discussed options of SAVR vs TAVR. Gated cardiac CT 08/22/20 with bicuspid aortic valve with thickened and calcified leaflets with partial fusion of the right and left coronary cusps with severely reduced cusp separation c/w severe AS. CTA of the chest/abdomen and pelvis showed anatomy suitable for transfemoral approach to TAVR. There was an incidental finding of a hypervascular lesion in the right lobe of the liver as well as left adrenal nodule. He underwent MRI of the abdomen showing the appearance of the liver  favored focal nodular hyperplasia which could be surveilled in 1 year as well as a small left adrenal adenoma. The patient was not interested in pursuing open heart surgery. He was felt to be a reasonable candidate for TAVR which was scheduled for 10/29/20.  He was contacted for his instructions today and noted to be SOB on the phone which was new. He reports a generalized sense of constant mild dyspnea over the last 2-3 days. It is not worse lying flat or doing activity (he has stuck to ADLs lately). He also reports a continuous chest aching that seems more noticeable than before. It does not worsen with exertion. For both the dyspnea and shortness of breath, he generally feels better when he lays back in a reclined position. As a result, he states he is feeling better this afternoon and wishes to go home. He has noticed some dizziness when he is standing, but does not occur with the onset of position change. He has not had any near-syncope, syncope, palpitations, bleeding, fever, or cough. hsTroponin negative, BNP 134, glu 114, Covid panel panel, CXR NAD. EKG NSR no acute changes. VSS. They have noted while sleeping on telemetry here his RR and O2 will drop periodically by pulse oximetry. He reports a sleep study 3 years ago at which time he was told he had borderline OSA although weighed 100lb more at that time (intentional weight loss).   Past Medical History:  Diagnosis Date  . Adrenal adenoma   . Aortic stenosis   . COVID-19 virus infection 05/2020  .  Depression    intolerant of cymbalta and wellbutrin  . Diverticulosis of colon   . Esophagitis 03/2015   by EGD  . Gastritis 03/2015   by EGD - mild, chronic  . Hyperlipidemia   . Hypertension   . Left ventricular hypertrophy   . Lichen simplex chronicus 05/2015   R scrotum [name])  . Liver lesion    2022 CTs: There was an incidental finding of a hypervascular lesion in the right lobe of the liver as well as left adrenal nodule. He  underwent MRI of the abdomen showing the appearance of the liver favored focal nodular hyperplasia, as well as small left adrenal adenoma.  . Murmur    aortic sclerosis, SBE  prophlaxis not needed as per cardio  . NAFLD (nonalcoholic fatty liver disease) 01/2015   by [place]  . 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 T2DM (type 2 diabetes mellitus) ([place])    DSME 09/2013    Past Surgical History:  Procedure Laterality Date  . COLONOSCOPY  2006   diverticulosis per pt  . COLONOSCOPY  03/2015   2 polyps, rpt 5 yrs [name])  . ESOPHAGOGASTRODUODENOSCOPY  03/2015   mild chronic gastritis, esophagitis   . LUMBAR LAMINECTOMY  1989   L5-S1  . RIGHT/LEFT HEART CATH AND CORONARY ANGIOGRAPHY N/A 08/15/2020   Procedure: RIGHT/LEFT HEART CATH AND CORONARY ANGIOGRAPHY;  Surgeon: Burnell Blanks, MD;  Location: [facility];  Service: Cardiovascular;  Laterality: N/A;     Medications Prior to Admission: Prior to Admission medications   Medication Sig Start Date End Date Taking? Authorizing Provider  aspirin EC 81 MG tablet Take 81 mg by mouth every evening. Swallow whole.    [provider]  Blood Glucose Monitoring Suppl ([place]) w/Device KIT 1 kit by Does not apply route daily. Use to check blood sugars 2 times daily 01/27/17   Elayne Snare, MD  cephALEXin (KEFLEX) 500 MG capsule TAKE FOUR TABLETS BY MOUTH 60 MINUTES PRIOR TO ANY DENTAL APPOINTMENTS Patient taking differently: Take 2,000 mg by mouth See admin instructions. TAKE 2000 MG BY MOUTH 60 MINUTES PRIOR TO ANY DENTAL APPOINTMENTS 09/16/20   Burnell Blanks, MD  Cholecalciferol (VITAMIN D3) 50 MCG (2000 UT) TABS Take 2,000 Units by mouth daily.    [provider]  Cyanocobalamin (VITAMIN B-12) 2500 MCG SUBL Take 2,500 mcg by mouth daily.    [provider]  FARXIGA 10 MG TABS tablet TAKE 1 TABLET (10 MG TOTAL) BY MOUTH EVERY MORNING. TAKE 1 TABLET BY MOUTH EVERY  MORNING. Patient taking differently: Take 5 mg by mouth daily. 05/03/20   Elayne Snare, MD  fluticasone (FLONASE) 50 MCG/ACT nasal spray Place 1 spray into both nostrils daily as needed for allergies or rhinitis.    [provider]  ibuprofen (ADVIL,MOTRIN) 200 MG tablet Take 400 mg by mouth every 8 (eight) hours as needed for moderate pain.    [provider]  losartan (COZAAR) 100 MG tablet Take 50 mg by mouth daily.    [provider]  Magnesium 250 MG TABS Take 250 mg by mouth every evening.    [provider]  metFORMIN (GLUCOPHAGE) 1000 MG tablet TAKE 1 TABLET (1,000 MG TOTAL) BY MOUTH 2 TIMES DAILY WITH A MEAL. Patient taking differently: Take 1,000 mg by mouth every evening. 10/14/20   Elayne Snare, MD  metoprolol tartrate (LOPRESSOR) 50 MG tablet Take one tablet by mouth as directed prior  to 3/8 scan Patient not taking: Reported on 10/24/2020 08/16/20   Burnell Blanks, MD  omeprazole (PRILOSEC) 20 MG capsule Take 1 capsule (20 mg total) by mouth daily as needed. 12/25/19   Ria Bush, MD  [facility] VERIO test strip USE TO CHECK BLOOD SUGARS 2 TIMES DAILY 08/30/19   Elayne Snare, MD  rosuvastatin (CRESTOR) 5 MG tablet Take 1 tablet (5 mg total) by mouth daily. 12/25/19   Ria Bush, MD  Semaglutide, 1 MG/DOSE, (OZEMPIC, 1 MG/DOSE,) 4 MG/3ML SOPN Inject 1 mg as directed once a week. Patient taking differently: Inject 0.5 mg as directed every Monday. 06/28/20   Elayne Snare, MD  sildenafil (REVATIO) 20 MG tablet Take 2-5 tablets (40-100 mg total) by mouth daily as needed (relations). 12/25/19   Ria Bush, MD  TURMERIC PO Take 1,800 mg by mouth every evening.    [provider]  Vitamin D-Vitamin K (K2 PLUS D3 PO) Take 1 tablet by mouth every evening.    [provider]  Zinc 50 MG TABS Take 50 mg by mouth every evening.    [provider]     Allergies:    Allergies  Allergen Reactions  . Penicillins      Respiratory distress as infant    Social History:   Social History   Socioeconomic History  . Marital status: Married    Spouse name: Not on file  . Number of children: 2  . Years of education: Not on file  . Highest education level: Not on file  Occupational History  . Occupation: Retired-Worked for the [employer]  . Smoking status: Never Smoker  . Smokeless tobacco: Never Used  Substance and Sexual Activity  . Alcohol use: Not Currently    Alcohol/week: 0.0 standard drinks    Comment: occasional  . Drug use: No  . Sexual activity: Not on file  Other Topics Concern  . 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  Keeps record of caloric intake   Social Determinants of Health   Financial Resource Strain: Not on file  Food Insecurity: Not on file  Transportation Needs: Not on file  Physical Activity: Not on file  Stress: Not on file  Social Connections: Not on file  Intimate Partner Violence: Not on file    Family History:   The patient's family history includes Alcohol abuse in his brother and mother; Alzheimer's disease in his father; CAD in his brother; Coronary artery disease in his maternal uncle; Diabetes in his brother and maternal grandfather; Emphysema in his mother; Hyperlipidemia in his father; Hypertension in his father; Prostate cancer in his brother; Schizophrenia in his paternal uncle; Stroke in his father, maternal grandfather, maternal grandmother, paternal grandfather, and paternal grandmother.    ROS:  Please see the history of present illness.  All other ROS reviewed and negative.     Physical Exam/Data:   Vitals:   10/25/20 1245 10/25/20 1337 10/25/20 1345 10/25/20 1435  BP: 117/72 125/63  112/70 118/69  Pulse: 80 79 83 81  Resp: (!) 23 13 (!) 21 18  Temp:      SpO2: 99% 99% 97% 94%  Weight:      Height:  No intake or output data in the 24 hours ending 10/25/20 1543 Last 3 Weights 10/25/2020 10/22/2020 09/16/2020  Weight (lbs) 215 lb 222 lb 6.4 oz 218 lb 6.4 oz  Weight (kg) 97.523 kg 100.88 kg 99.066 kg     Body mass index is 29.99 kg/m.  General: Well developed, well nourished WM, in no acute distress. Head: Normocephalic, atraumatic, sclera non-icteric, no xanthomas, nares are without discharge. Neck: Bilateral carotid bruits (no significant carotid disease by recent duplex). JVP not elevated. Lungs: Clear bilaterally to auscultation without wheezes, rales, or rhonchi. Breathing is unlabored. Heart: RRR 3/6 SEM best heard at RUSB, S2 muffled. No rubs or gallops.   Abdomen: Soft, non-tender, non-distended with normoactive bowel sounds. No rebound/guarding. Extremities: No clubbing or cyanosis. No edema. Distal pedal pulses are 2+ and equal bilaterally. Neuro: Alert and oriented X 3. Moves all extremities spontaneously. Psych:  Responds to questions appropriately with a normal affect.   EKG:  The ECG that was done today was personally reviewed and demonstrates NSR 82bpm no acute STT changes  Relevant CV Studies: Cath 08/15/20 1. No angiographic evidence of CAD 2. Severe aortic stenosis by echo with likely bicuspid aortic valve. Cath with mean gradient 34 mmHg, peak to peak gradient 44 mmHg.   Recommendations: Will continue workup for AVR vs TAVR. Will plan CT scans and then will review with valve team. At this time he is relatively asymptomatic and we may be able to follow his aortic stenosis for now.   2D echo 07/11/20 1. Aortic valve is bicuspid or functionally bicuspid. Aortic stenosis is  severe with peak/mean transaortic gradients 68/41 mmHg.  2. Left ventricular ejection fraction, by estimation, is 55 to 60%. The  left ventricle has normal function. The  left ventricle has no regional  wall motion abnormalities. There is severe concentric left ventricular  hypertrophy. Left ventricular diastolic  parameters are consistent with Grade I diastolic dysfunction (impaired  relaxation). Elevated left atrial pressure.  3. Right ventricular systolic function is normal. The right ventricular  size is normal. There is normal pulmonary artery systolic pressure. The  estimated right ventricular systolic pressure is 97.0 mmHg.  4. Left atrial size was mildly dilated.  5. The mitral valve is normal in structure. Trivial mitral valve  regurgitation. No evidence of mitral stenosis.  6. The aortic valve is bicuspid. There is severe calcifcation of the  aortic valve. There is severe thickening of the aortic valve. Aortic valve  regurgitation is mild. Severe aortic valve stenosis. Aortic valve mean  gradient measures 41.0 mmHg.  7. The inferior vena cava is normal in size with greater than 50%  respiratory variability, suggesting right atrial pressure of 3 mmHg.    CT Coronary Morph  IMPRESSION: 1. Bicuspid aortic valve with partial fusion of left and right coronary cusps and raphe present. Severely reduced cusp separation. Severely thickened, severely calcified aortic valve cusps.  2.  AV calcium score: 2886  3.  Annulus area 559 mm2, suitable for 29 mm Sapien 3 valve.  4.  Sufficient coronary artery height from annulus.  5.  Normal caliber ascending thoracic aorta.  6. Optimum Fluoroscopic Angle for Delivery: LAO 3 CAU 3   Laboratory Data:  High Sensitivity Troponin:   Recent Labs  Lab 10/25/20 1046 10/25/20 1242  TROPONINIHS 5 5      Chemistry Recent Labs  Lab 10/25/20 1046  NA 134*  K 4.3  CL 99  CO2 26  GLUCOSE 114*  BUN 13  CREATININE  0.88  CALCIUM 9.0  GFRNONAA >60  ANIONGAP 9    Hematology Recent Labs  Lab 10/25/20 1046  WBC 8.5  RBC 4.73  HGB 15.2  HCT 44.9  MCV 94.9  MCH 32.1  MCHC 33.9  RDW  14.6  PLT 265   BNPNo results for input(s): BNP, PROBNP in the last 168 hours.  DDimer No results for input(s): DDIMER in the last 168 hours.   Radiology/Studies:  DG Chest 2 View  Result Date: 10/25/2020 CLINICAL DATA:  Chest pain for 1 month worsened in past several days, planned AVR EXAM: CHEST - 2 VIEW COMPARISON:  07/26/2007 FINDINGS: Normal heart size, mediastinal contours, and pulmonary vascularity. Lungs clear. No infiltrate, pleural effusion, or pneumothorax. Osseous structures unremarkable. IMPRESSION: No acute abnormalities. Electronically Signed   By: Lavonia Dana M.D.   On: 10/25/2020 11:04     Assessment and Plan:   1. Dyspnea and chest pain in the setting of known severe aortic stenosis with bicuspid AV - symptoms seem somewhat atypical and clinically no evidence of volume overload, ACS, or pulmonary abnormalities on CXR - troponins negative, Covid negative - I received sign out from our covering cardmaster that patient was provided with all the instructions he needs for TAVR on Tuesday - discussed option of overnight observation but patient is quite clear that he wishes to go home as he is feeling better - return precautions/warning sx reviewed with pt - will review finalized recommendations with MD  2. Essential HTN - controlled on present regimen  3. Hyperlipidemia - continue statin  4. Diabetes mellitus type 2 - no change in regimen at this time, follow pre-TAVR instructions as advised  5. Possible OSA - noted to have episodic decreases in RR and O2 sat with sleep on telemetry - consider repeat sleep study after DC   For questions or updates, please contact [place] Please consult [email or web address] for contact info under     Signed, Charlie Pitter, PA-C  10/25/2020 3:43 PM     Patient seen and examined. Agree with assessment and plan.  Evan [name] is a 65 year old gentleman with bicuspid aortic valve stenosis who is scheduled to undergo TAVR on Oct 29, 2020.  He has a history of LVH, depression, hypertension, hyperlipidemia, diabetes mellitus, as well as nonalcoholic fatty liver disease.  The patient had not wanted to pursue open surgical valve repair for his bicuspid valve and is scheduled for this procedure to be done by Drs. McAlhany and [place].  Prior catheterization did not reveal any cardiac struct of disease.  An echo Doppler study has shown a mean gradient of 41.  Presently there are no signs of overt CHF.  He is somewhat anxious.  He has noticed some slight increase in shortness of breath over the past month.  HEENT is unremarkable.  He has transmitted murmur to his carotids.  Lungs are clear without wheezes or rales.  Rhythm is regular with a 2-3/6 mid peaking systolic murmur there is no S3 gallop.  Abdomen was soft nontender.  There is no edema.  ECG shows sinus rhythm at 82 bpm without acute ST-T abnormalities.  Caryl Pina we were planning to potentially keep the patient overnight for observation.  However, he has felt improved since being here particularly lying down.  He has stable hemodynamics.  Okay to discharge today with plans to keep scheduled TAVR surgical appointment on Oct 29, 2020.   Troy Sine, MD, [facility] 10/25/2020 4:41 PM

## 2020-10-25 NOTE — ED Notes (Signed)
Pt in XRAY 

## 2020-10-25 NOTE — Discharge Instructions (Signed)
Follow up as scheduled.  Return if any problems.

## 2020-10-25 NOTE — Telephone Encounter (Signed)
Thanks Lauren!

## 2020-10-28 ENCOUNTER — Other Ambulatory Visit: Payer: Self-pay

## 2020-10-28 ENCOUNTER — Encounter (HOSPITAL_COMMUNITY): Payer: Self-pay

## 2020-10-28 ENCOUNTER — Encounter (HOSPITAL_COMMUNITY)
Admission: RE | Admit: 2020-10-28 | Discharge: 2020-10-28 | Disposition: A | Discharge status: Home / Self Care | Payer: Medicare Other | Source: Ambulatory Visit | Attending: Cardiovascular Disease | Admitting: Cardiovascular Disease

## 2020-10-28 DIAGNOSIS — Z01812 Encounter for preprocedural laboratory examination: Secondary | ICD-10-CM | POA: Insufficient documentation

## 2020-10-28 DIAGNOSIS — I350 Nonrheumatic aortic (valve) stenosis: Secondary | ICD-10-CM

## 2020-10-28 HISTORY — DX: Other complications of anesthesia, initial encounter: T88.59XA

## 2020-10-28 HISTORY — DX: Family history of other specified conditions: Z84.89

## 2020-10-28 LAB — PROTIME-INR
INR: 1 (ref 0.8–1.2)
Prothrombin Time: 13 seconds (ref 11.4–15.2)

## 2020-10-28 LAB — CBC
HCT: 44.2 % (ref 39.0–52.0)
Hemoglobin: 14.7 g/dL (ref 13.0–17.0)
MCH: 31.4 pg (ref 26.0–34.0)
MCHC: 33.3 g/dL (ref 30.0–36.0)
MCV: 94.4 fL (ref 80.0–100.0)
Platelets: 272 10*3/uL (ref 150–400)
RBC: 4.68 MIL/uL (ref 4.22–5.81)
RDW: 14.1 % (ref 11.5–15.5)
WBC: 6.9 10*3/uL (ref 4.0–10.5)
nRBC: 0 % (ref 0.0–0.2)

## 2020-10-28 LAB — COMPREHENSIVE METABOLIC PANEL
ALT: 29 U/L (ref 0–44)
AST: 28 U/L (ref 15–41)
Albumin: 3.7 g/dL (ref 3.5–5.0)
Alkaline Phosphatase: 51 U/L (ref 38–126)
Anion gap: 10 (ref 5–15)
BUN: 15 mg/dL (ref 8–23)
CO2: 23 mmol/L (ref 22–32)
Calcium: 9.4 mg/dL (ref 8.9–10.3)
Chloride: 102 mmol/L (ref 98–111)
Creatinine, Ser: 0.77 mg/dL (ref 0.61–1.24)
GFR, Estimated: >60 mL/min (ref >60)
Glucose, Bld: 111 mg/dL — ABNORMAL HIGH (ref 70–99)
Potassium: 3.8 mmol/L (ref 3.5–5.1)
Sodium: 135 mmol/L (ref 135–145)
Total Bilirubin: 0.6 mg/dL (ref 0.3–1.2)
Total Protein: 7 g/dL (ref 6.5–8.1)

## 2020-10-28 LAB — URINALYSIS, ROUTINE W REFLEX MICROSCOPIC
Bacteria, UA: NONE SEEN
Bilirubin Urine: NEGATIVE
Glucose, UA: >=500 mg/dL — AB
Hgb urine dipstick: NEGATIVE
Ketones, ur: NEGATIVE mg/dL
Leukocytes,Ua: NEGATIVE
Nitrite: NEGATIVE
Protein, ur: NEGATIVE mg/dL
Specific Gravity, Urine: 1.012 (ref 1.005–1.030)
pH: 5 (ref 5.0–8.0)

## 2020-10-28 LAB — BLOOD GAS, ARTERIAL
Acid-Base Excess: 2.3 mmol/L — ABNORMAL HIGH (ref 0.0–2.0)
Bicarbonate: 26 mmol/L (ref 20.0–28.0)
Drawn by: 58793
FIO2: 21
O2 Saturation: 97.7 %
Patient temperature: 37
pCO2 arterial: 38.6 mmHg (ref 32.0–48.0)
pH, Arterial: 7.445 (ref 7.350–7.450)
pO2, Arterial: 102 mmHg (ref 83.0–108.0)

## 2020-10-28 LAB — TYPE AND SCREEN
ABO/RH(D): O POS
Antibody Screen: NEGATIVE

## 2020-10-28 LAB — HEMOGLOBIN A1C
Hgb A1c MFr Bld: 6.1 % — ABNORMAL HIGH (ref 4.8–5.6)
Mean Plasma Glucose: 128.37 mg/dL

## 2020-10-28 LAB — SURGICAL PCR SCREEN
MRSA, PCR: NEGATIVE
Staphylococcus aureus: NEGATIVE

## 2020-10-28 LAB — GLUCOSE, CAPILLARY: Glucose-Capillary: 136 mg/dL — ABNORMAL HIGH (ref 70–99)

## 2020-10-28 MED ORDER — DEXMEDETOMIDINE HCL IN NACL 400 MCG/100ML IV SOLN
0.1000 ug/kg/h | INTRAVENOUS | Status: AC
  Administered 2020-10-29: 1 ug/kg/h via INTRAVENOUS
  Filled 2020-10-28: qty 100

## 2020-10-28 MED ORDER — SODIUM CHLORIDE 0.9 % IV SOLN
INTRAVENOUS | Status: DC
  Filled 2020-10-28: qty 30

## 2020-10-28 MED ORDER — MAGNESIUM SULFATE 50 % IJ SOLN
40.0000 meq | INTRAMUSCULAR | Status: DC
  Filled 2020-10-28: qty 9.85

## 2020-10-28 MED ORDER — POTASSIUM CHLORIDE 2 MEQ/ML IV SOLN
80.0000 meq | INTRAVENOUS | Status: DC
  Filled 2020-10-28: qty 40

## 2020-10-28 MED ORDER — NOREPINEPHRINE 4 MG/250ML-% IV SOLN
0.0000 ug/min | INTRAVENOUS | Status: DC
  Filled 2020-10-28: qty 250

## 2020-10-28 MED ORDER — VANCOMYCIN HCL 1500 MG/300ML IV SOLN
1500.0000 mg | INTRAVENOUS | Status: AC
  Administered 2020-10-29: 1500 mg via INTRAVENOUS
  Filled 2020-10-28: qty 300

## 2020-10-28 NOTE — Progress Notes (Signed)
PCP - Ria Bush, MD Cardiologist - Cherlynn Kaiser, MD  PPM/ICD - denies Device Orders - N/A Rep Notified - N/A  Chest x-ray - 10/25/2020 EKG - 10/28/2020 Stress Test - denies ECHO - 07/11/2020 Cardiac Cath - 08/15/2020  Sleep Study - 04/02/16 CPAP - no  Fasting Blood Sugar - 100 Checks Blood Sugar once a day CBG today 137 A1C - done on 10/28/2020  Blood Thinner Instructions: N/A Per MD, patient stopped taking metformin on 10/27/2020; the last dose was on 10/26/2020; patient will continue to take all other medications without change through the day before surgery. The morning of surgery he will not take any medications.   ERAS Protcol - no  COVID TEST- patient has COVID test done on 10/25/2020 and was negative. After the test he quarantine at home.    Anesthesia review: Baldwin Crown reviewed the chart while patient was in PAT appointment.   Patient denies shortness of breath, fever, cough and chest pain at PAT appointment   All instructions explained to the patient, with a verbal understanding of the material. Patient agrees to go over the instructions while at home for a better understanding. Patient also instructed to self quarantine after being tested for COVID-19. The opportunity to ask questions was provided.

## 2020-10-28 NOTE — H&P (Signed)
[address]       [place] [address]             [phone]      Cardiothoracic Surgery Admission History and Physical  Referring Provider is Elouise Munroe, MD Primary Cardiologist is No primary care provider on file. PCP is Ria Bush, MD      Chief Complaint  Patient presents with  . Aortic Stenosis        HPI:  The patient is a 65 year old gentleman with a history of hypertension, hyperlipidemia, type 2 diabetes, fatty liver disease, GERD, depression, and bicuspid aortic valve disease with severe aortic stenosis.  He had a 2D echocardiogram in August 2021 that showed a mean gradient of 37 mmHg with a valve area of 1.15 cm.  Left ventricular ejection fraction is 55 to 60% with moderate LVH and grade 1 diastolic dysfunction.  His most recent echo on 07/11/2020 showed an increase in the mean gradient to 41 mmHg consistent with severe aortic stenosis.  There is mild aortic insufficiency.  The valve is severely calcified and bicuspid.  Left ventricular ejection fraction is 55 to 60% with severe concentric LVH and grade 1 diastolic dysfunction.  He underwent cardiac catheterization on 08/15/2020 which showed no evidence of coronary disease.  The mean gradient across aortic valve was 34 mmHg with peak to peak gradient of 44 mmHg.  The patient lives with his wife.  He reports having exertional chest pressure as well as some at rest he has had frequent episodes of palpitations.  He denies any shortness of breath or fatigue.  He has had dizzy spells with position changes.  He remains active although he has noted some recent decrease in his exercise tolerance.      Past Medical History:  Diagnosis Date  . Aortic stenosis   . COVID-19 virus infection 05/2020  . Depression    intolerant of cymbalta and wellbutrin  . Diverticulosis of colon   . Esophagitis 03/2015   by EGD  . Gastritis 03/2015   by EGD - mild, chronic  . Hyperlipidemia   .  Hypertension   . Lichen simplex chronicus 05/2015   R scrotum [name])  . Murmur    aortic sclerosis, SBE  prophlaxis not needed as per cardio  . NAFLD (nonalcoholic fatty liver disease) 01/2015   by [place]  . 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 T2DM (type 2 diabetes mellitus) ([place])    DSME 09/2013         Past Surgical History:  Procedure Laterality Date  . COLONOSCOPY  2006   diverticulosis per pt  . COLONOSCOPY  03/2015   2 polyps, rpt 5 yrs [name])  . ESOPHAGOGASTRODUODENOSCOPY  03/2015   mild chronic gastritis, esophagitis   . LUMBAR LAMINECTOMY  1989   L5-S1  . RIGHT/LEFT HEART CATH AND CORONARY ANGIOGRAPHY N/A 08/15/2020   Procedure: RIGHT/LEFT HEART CATH AND CORONARY ANGIOGRAPHY;  Surgeon: Burnell Blanks, MD;  Location: [facility];  Service: Cardiovascular;  Laterality: N/A;         Family History  Problem Relation Age of Onset  . Alzheimer's disease Father   . Hypertension Father   . Stroke Father   . Hyperlipidemia Father   . Prostate cancer Brother   . Diabetes Brother   . Alcohol abuse Brother   . CAD Brother   . Coronary artery disease Maternal Uncle   . Schizophrenia Paternal  Uncle   . Emphysema Mother   . Alcohol abuse Mother   . Stroke Maternal Grandmother   . Stroke Maternal Grandfather   . Diabetes Maternal Grandfather   . Stroke Paternal Grandmother   . Stroke Paternal Grandfather     Social History        Socioeconomic History  . Marital status: Married    Spouse name: Not on file  . Number of children: 2  . Years of education: Not on file  . Highest education level: Not on file  Occupational History  . Occupation: Retired-Worked for the [employer]  . Smoking status: Never Smoker  . Smokeless tobacco: Never Used  Substance and Sexual Activity  . Alcohol use: Yes    Alcohol/week: 0.0 standard drinks    Comment: occasional  . Drug  use: No  . Sexual activity: Not on file  Other Topics Concern  . 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  Keeps record of caloric intake   Social Determinants of Health   Financial Resource Strain: Not on file  Food Insecurity: Not on file  Transportation Needs: Not on file  Physical Activity: Not on file  Stress: Not on file  Social Connections: Not on file  Intimate Partner Violence: Not on file          Current Outpatient Medications  Medication Sig Dispense Refill  . aspirin EC 81 MG tablet Take 81 mg by mouth every evening. Swallow whole.    . Blood Glucose Monitoring Suppl (ONETOUCH VERIO FLEX SYSTEM) w/Device KIT 1 kit by Does not apply route daily. Use to check blood sugars 2 times daily 1 kit 2  . cephALEXin (KEFLEX) 500 MG capsule TAKE FOUR TABLETS BY MOUTH 60 MINUTES PRIOR TO ANY DENTAL APPOINTMENTS 12 capsule 3  . Cholecalciferol (VITAMIN D3) 50 MCG (2000 UT) TABS Take 2,000 Units by mouth daily.    . Cyanocobalamin (VITAMIN B-12) 2500 MCG SUBL Take 2,500 mcg by mouth daily.    .Marland KitchenFARXIGA 10 MG TABS tablet TAKE 1 TABLET (10 MG TOTAL) BY MOUTH EVERY MORNING. TAKE 1 TABLET BY MOUTH EVERY MORNING. 30 tablet 2  . fluticasone (FLONASE) 50 MCG/ACT nasal spray Place 1 spray into both nostrils daily as needed for allergies or rhinitis.    .Marland Kitchenibuprofen (ADVIL,MOTRIN) 200 MG tablet Take 400 mg by mouth every 8 (eight) hours as needed (PAIN).    .Marland Kitchenlosartan (COZAAR) 100 MG tablet Take 50 mg by mouth daily.    . Magnesium 250 MG TABS Take 250 mg by mouth every evening.    . metFORMIN (GLUCOPHAGE) 1000 MG tablet TAKE 1 TABLET (1,000 MG TOTAL) BY MOUTH 2 TIMES DAILY WITH A MEAL. 180 tablet 1  . metoprolol tartrate (LOPRESSOR)  50 MG tablet Take one tablet by mouth as directed prior to 3/8 scan 1 tablet 0  . omeprazole (PRILOSEC) 20 MG capsule Take 1 capsule (20 mg total) by mouth daily as needed.    .Glory RosebushVERIO test strip USE TO CHECK BLOOD SUGARS 2 TIMES DAILY 200 strip 3  . rosuvastatin (CRESTOR) 5 MG tablet Take 1 tablet (5 mg total) by mouth daily. 90 tablet 3  . Semaglutide, 1 MG/DOSE, (OZEMPIC,  1 MG/DOSE,) 4 MG/3ML SOPN Inject 1 mg as directed once a week. 9 mL 1  . sildenafil (REVATIO) 20 MG tablet Take 2-5 tablets (40-100 mg total) by mouth daily as needed (relations). 30 tablet 3  . TURMERIC PO Take 1,800 mg by mouth every evening.    . Vitamin D-Vitamin K (K2 PLUS D3 PO) Take 1 tablet by mouth every evening.    . Zinc 50 MG TABS Take 50 mg by mouth every evening.     No current facility-administered medications for this visit.         Allergies  Allergen Reactions  . Penicillins     Respiratory distress as infant      Review of Systems:              General:                      normal appetite, normal energy, no weight gain, no weight loss, no fever             Cardiac:                       + chest pain with exertion, + chest pain at rest, no SOB with  exertion, no resting SOB, no PND, no orthopnea, + palpitations, no arrhythmia, no atrial fibrillation, no LE edema, + dizzy spells, no syncope             Respiratory:                 no shortness of breath, no home oxygen, no productive cough, no dry cough, no bronchitis, no wheezing, no hemoptysis, no asthma, no pain with inspiration or cough, no sleep apnea, no CPAP at night             GI:                               no difficulty swallowing, no reflux, no frequent heartburn, no hiatal hernia, no abdominal pain, no constipation, no diarrhea, no hematochezia, no hematemesis, no melena             GU:                              no dysuria,  no frequency, no urinary tract infection, no hematuria, no enlarged prostate, no  kidney stones, no kidney disease             Vascular:                     no pain suggestive of claudication, no pain in feet, no leg cramps, no varicose veins, no DVT, no non-healing foot ulcer             Neuro:                         no stroke, no TIA's, no seizures, no headaches, no temporary blindness one eye,  no slurred speech, no peripheral neuropathy, no chronic pain, no instability of gait, no memory/cognitive dysfunction             Musculoskeletal:         + arthritis, no joint swelling, no myalgias, no difficulty walking, normal mobility  Skin:                            no rash, no itching, no skin infections, no pressure sores or ulcerations             Psych:                         no anxiety, no depression, no nervousness, no unusual recent stress             Eyes:                           no blurry vision, + floaters, no recent vision changes, does not wear glasses or contacts             ENT:                            + hearing loss, no loose or painful teeth, no dentures, last saw dentist 09/17/20.  Had a cavity that needed to be repaired.             Hematologic:               no easy bruising, no abnormal bleeding, no clotting disorder, no frequent epistaxis             Endocrine:                   + diabetes, does check CBG's at home.                                         Physical Exam:              Ht '5\' 11"'  (1.803 m)   BMI 30.46 kg/m              General:                      well-appearing             HEENT:                       Unremarkable, NCAT, PERLA, EOMI             Neck:                           no JVD, no bruits, no adenopathy              Chest:                          clear to auscultation, symmetrical breath sounds, no wheezes, no rhonchi              CV:                              RRR, grade lll/VI crescendo/decrescendo murmur heard best at RSB,  no diastolic murmur             Abdomen:  soft, non-tender, no  masses              Extremities:                 warm, well-perfused, pulses palpable at ankle, no LE edema             Rectal/GU                   Deferred             Neuro:                         Grossly non-focal and symmetrical throughout             Skin:                            Clean and dry, no rashes, no breakdown   Diagnostic Tests:    ECHOCARDIOGRAM REPORT       Patient Name:  Evan [name] Date of Exam: 07/11/2020  Medical Rec #: [id number]    Height:    71.0 in  Accession #:  [id number]   Weight:    222.0 lb  Date of Birth: [birth year]    BSA:     2.204 m  Patient Age:  80 years    BP:      126/84 mmHg  Patient Gender: M        HR:      84 bpm.  Exam Location: [facility]   Procedure: 2D Echo, Cardiac Doppler, Color Doppler and Intracardiac       Opacification Agent   Indications:  Aortic atherosclerosis; I35.0 Nonrheumatic aortic (valve)         stenosis    History:    Patient has prior history of Echocardiogram examinations,  most         recent 01/15/2020. Signs/Symptoms:Murmur; Risk         Factors:Hypertension, Diabetes and Dyslipidemia. Chronic  cough.         Chronic migraine. Obstructive sleep apnea.    Sonographer:  Diamond Nickel RCS  Referring Phys: [id number] [place]    1. Aortic valve is bicuspid or functionally bicuspid. Aortic stenosis is  severe with peak/mean transaortic gradients 68/41 mmHg.  2. Left ventricular ejection fraction, by estimation, is 55 to 60%. The  left ventricle has normal function. The left ventricle has no regional  wall motion abnormalities. There is severe concentric left ventricular  hypertrophy. Left ventricular diastolic  parameters are consistent with Grade I diastolic dysfunction (impaired  relaxation). Elevated left atrial pressure.  3. Right ventricular systolic function is  normal. The right ventricular  size is normal. There is normal pulmonary artery systolic pressure. The  estimated right ventricular systolic pressure is 85.6 mmHg.  4. Left atrial size was mildly dilated.  5. The mitral valve is normal in structure. Trivial mitral valve  regurgitation. No evidence of mitral stenosis.  6. The aortic valve is bicuspid. There is severe calcifcation of the  aortic valve. There is severe thickening of the aortic valve. Aortic valve  regurgitation is mild. Severe aortic valve stenosis. Aortic valve mean  gradient measures 41.0 mmHg.  7. The inferior vena cava is normal in size with greater than 50%  respiratory variability, suggesting right atrial pressure of 3 mmHg.   FINDINGS  Left Ventricle: Left ventricular ejection fraction, by  estimation, is 55  to 60%. The left ventricle has normal function. The left ventricle has no  regional wall motion abnormalities. The left ventricular internal cavity  size was normal in size. There is  severe concentric left ventricular hypertrophy. Left ventricular  diastolic parameters are consistent with Grade I diastolic dysfunction  (impaired relaxation). Elevated left atrial pressure.   Right Ventricle: The right ventricular size is normal. No increase in  right ventricular wall thickness. Right ventricular systolic function is  normal. There is normal pulmonary artery systolic pressure. The tricuspid  regurgitant velocity is 2.44 m/s, and  with an assumed right atrial pressure of 3 mmHg, the estimated right  ventricular systolic pressure is 91.4 mmHg.   Left Atrium: Left atrial size was mildly dilated.   Right Atrium: Right atrial size was normal in size.   Pericardium: There is no evidence of pericardial effusion.   Mitral Valve: The mitral valve is normal in structure. Trivial mitral  valve regurgitation. No evidence of mitral valve stenosis.   Tricuspid Valve: The tricuspid valve is normal in structure.  Tricuspid  valve regurgitation is mild . No evidence of tricuspid stenosis.   Aortic Valve: The aortic valve is bicuspid. There is severe calcifcation  of the aortic valve. There is severe thickening of the aortic valve.  Aortic valve regurgitation is mild. Severe aortic stenosis is present.  Aortic valve mean gradient measures 41.0  mmHg. Aortic valve peak gradient measures 59.3 mmHg. Aortic valve area, by  VTI measures 1.38 cm.   Pulmonic Valve: The pulmonic valve was normal in structure. Pulmonic valve  regurgitation is not visualized. No evidence of pulmonic stenosis.   Aorta: The aortic root is normal in size and structure.   Venous: The inferior vena cava is normal in size with greater than 50%  respiratory variability, suggesting right atrial pressure of 3 mmHg.   IAS/Shunts: No atrial level shunt detected by color flow Doppler.     LEFT VENTRICLE  PLAX 2D  LVIDd:     3.50 cm Diastology  LVIDs:     2.10 cm LV e' medial:  6.31 cm/s  LV PW:     1.60 cm LV E/e' medial: 13.7  LV IVS:    1.50 cm LV e' lateral:  6.66 cm/s  LVOT diam:   2.25 cm LV E/e' lateral: 13.0  LV SV:     117  LV SV Index:  53  LVOT Area:   3.98 cm     RIGHT VENTRICLE  RV Basal diam: 3.00 cm  RV S prime:   10.60 cm/s  TAPSE (M-mode): 2.4 cm  RVSP:      26.8 mmHg   LEFT ATRIUM       Index    RIGHT ATRIUM      Index  LA diam:    3.40 cm 1.54 cm/m RA Pressure: 3.00 mmHg  LA Vol (A2C):  65.4 ml 29.67 ml/m RA Area:   11.90 cm  LA Vol (A4C):  40.8 ml 18.51 ml/m RA Volume:  27.00 ml 12.25 ml/m  LA Biplane Vol: 53.6 ml 24.32 ml/m  AORTIC VALVE  AV Area (Vmax):  1.34 cm  AV Area (Vmean):  1.24 cm  AV Area (VTI):   1.38 cm  AV Vmax:      385.00 cm/s  AV Vmean:     279.000 cm/s  AV VTI:      0.847 m  AV Peak Grad:   59.3 mmHg  AV Mean Grad:  41.0 mmHg  LVOT Vmax:     130.00 cm/s  LVOT  Vmean:    87.300 cm/s  LVOT VTI:     0.295 m  LVOT/AV VTI ratio: 0.35    AORTA  Ao Root diam: 3.80 cm   MITRAL VALVE        TRICUSPID VALVE  MV Area (PHT): 3.31 cm   TR Peak grad:  23.8 mmHg  MV Decel Time: 229 msec   TR Vmax:    244.00 cm/s  MV E velocity: 86.50 cm/s  Estimated RAP: 3.00 mmHg  MV A velocity: 105.00 cm/s RVSP:      26.8 mmHg  MV E/A ratio: 0.82               SHUNTS               Systemic VTI: 0.30 m               Systemic Diam: 2.25 cm   Ena Dawley MD  Electronically signed by Ena Dawley MD  Signature Date/Time: 07/11/2020/4:00:50 PM     Physicians  Panel Physicians Referring Physician Case Authorizing Physician  Burnell Blanks, MD (Primary)      Procedures  RIGHT/LEFT HEART CATH AND CORONARY ANGIOGRAPHY   Conclusion  1. No angiographic evidence of CAD 2. Severe aortic stenosis by echo with likely bicuspid aortic valve. Cath with mean gradient 34 mmHg, peak to peak gradient 44 mmHg.   Recommendations: Will continue workup for AVR vs TAVR. Will plan CT scans and then will review with valve team. At this time he is relatively asymptomatic and we may be able to follow his aortic stenosis for now.     Recommendations  Antiplatelet/Anticoag Will continue workup for AVR vs TAVR. Will plan CT scans and then will review with valve team. At this time he is relatively asymptomatic and we may be able to follow his aortic stenosis for now.   Indications  Severe aortic stenosis [I35.0 (ICD-10-CM)]   Procedural Details  Technical Details Indication: severe aortic stenosis  Procedure: The risks, benefits, complications, treatment options, and expected outcomes were discussed with the patient. The patient and/or family concurred with the proposed plan, giving informed consent. The patient was brought to the cath lab after IV hydration was given.  The patient was sedated with Versed and Fentanyl. The IV catheter in the right antecubital vein was changed for a 5 [place] sheath. Right heart catheterization performed with a balloon tipped catheter. The right wrist was prepped and draped in a sterile fashion. 1% lidocaine was used for local anesthesia. Using the modified Seldinger access technique, a 5 French sheath was placed in the right radial artery. 3 mg Verapamil was given through the sheath. 5000 units IV heparin was given. Standard diagnostic catheters were used to perform selective coronary angiography. The aortic valve was crossed with the J wire and the JL3.5 catheter. LV pressures measured. The sheath was removed from the right radial artery and a Terumo hemostasis band was applied at the arteriotomy site on the right wrist.    Estimated blood loss <50 mL.   During this procedure medications were administered to achieve and maintain moderate conscious sedation while the patient's heart rate, blood pressure, and oxygen saturation were continuously monitored and I was present face-to-face 100% of this time.   Medications (Filter: Administrations occurring from [phone] to 0937 on 08/15/20)  midazolam (VERSED) injection (mg) Total dose:  2 mg  Date/Time Rate/Dose/Volume Action   08/15/20  0857 2 mg Given    fentaNYL (SUBLIMAZE) injection (mcg) Total dose:  50 mcg  Date/Time Rate/Dose/Volume Action   08/15/20 0857 50 mcg Given    lidocaine (PF) (XYLOCAINE) 1 % injection (mL) Total volume:  4 mL  Date/Time Rate/Dose/Volume Action   08/15/20 0905 2 mL Given   0907 2 mL Given    Heparin (Porcine) in NaCl 1000-0.9 UT/500ML-% SOLN (mL) Total volume:  1,000 mL  Date/Time Rate/Dose/Volume Action   08/15/20 0907 500 mL Given   0907 500 mL Given    Radial Cocktail/Verapamil only Total dose:  Cannot be calculated*  *Administration dose not documented Date/Time Rate/Dose/Volume Action   08/15/20 0908  Given     heparin sodium (porcine) injection (Units) Total dose:  5,000 Units  Date/Time Rate/Dose/Volume Action   08/15/20 0916 5,000 Units Given    iohexol (OMNIPAQUE) 350 MG/ML injection (mL) Total volume:  45 mL  Date/Time Rate/Dose/Volume Action   08/15/20 0937 45 mL Given     Sedation Time  Sedation Time Physician-1: 28 minutes 38 seconds   Contrast  Medication Name Total Dose  iohexol (OMNIPAQUE) 350 MG/ML injection 45 mL    Radiation/Fluoro  Fluoro time: 5.4 (min) DAP: 16279 (mGycm2) Cumulative Air Kerma: 814 (mGy)   Complications   Complications documented before study signed (08/15/2020 9:45 AM)     RIGHT/LEFT HEART CATH AND CORONARY ANGIOGRAPHY  None Documented by Burnell Blanks, MD 08/15/2020 9:34 AM  Date Found: 08/15/2020  Time Range: Intraprocedure       Coronary Findings   Diagnostic Dominance: Left  Left Anterior Descending  Vessel is large.  Left Circumflex  Vessel is large.  Right Coronary Artery  Vessel is moderate in size.   Intervention   No interventions have been documented.  Coronary Diagrams   Diagnostic Dominance: Left    Intervention    Implants       No implant documentation for this case.   Syngo Images  Show images for CARDIAC CATHETERIZATION  Images on Long Term Storage  Show images for [place], [place] to Procedure Log  Procedure Log     Hemo Data  Flowsheet Row Most Recent Value  Fick Cardiac Output 7.74 L/min  Fick Cardiac Output Index 3.55 (L/min)/BSA  Aortic Mean Gradient 33.96 mmHg  Aortic Peak Gradient 44 mmHg  Aortic Valve Area 1.33  Aortic Value Area Index 0.61 cm2/BSA  RA A Wave 7 mmHg  RA V Wave 1 mmHg  RA Mean 1 mmHg  RV Systolic Pressure 20 mmHg  RV Diastolic Pressure -1 mmHg  RV EDP 2 mmHg  PA Systolic Pressure 17 mmHg  PA Diastolic Pressure 2 mmHg  PA Mean 9 mmHg  PW A Wave 7 mmHg  PW V Wave 8 mmHg  PW  Mean 5 mmHg  AO Systolic Pressure 89 mmHg  AO Diastolic Pressure 52 mmHg  AO Mean 69 mmHg  LV Systolic Pressure 481 mmHg  LV Diastolic Pressure 2 mmHg  LV EDP 5 mmHg  AOp Systolic Pressure 856 mmHg  AOp Diastolic Pressure 63 mmHg  AOp Mean Pressure 82 mmHg  LVp Systolic Pressure 314 mmHg  LVp Diastolic Pressure 1 mmHg  LVp EDP Pressure 5 mmHg  QP/QS 1  TPVR Index 2.54 HRUI  TSVR Index 19.43 HRUI  PVR SVR Ratio 0.06  TPVR/TSVR Ratio 0.13    ADDENDUM REPORT: 08/22/2020 13:17  CLINICAL DATA: Pre-op transcatheter aortic valve replacement (TAVR)  EXAM: Cardiac TAVR CT  TECHNIQUE: The patient was  scanned on a [employer] 390 slice scanner. A 120 kV retrospective scan was triggered in the descending thoracic aorta at 111 HU's. Gantry rotation speed was 270 msecs and collimation was .9 mm. The 3D data set was reconstructed in 5% intervals of the R-R cycle. Systolic and diastolic phases were analyzed on a dedicated work station using MPR, MIP and VRT modes. The patient received 171m OMNIPAQUE IOHEXOL 350 MG/ML SOLN of contrast.  FINDINGS: Aortic Valve: Bicuspid aortic valve with partial fusion of right and left coronary cusps, raphe present. Severely reduced cusp separation. Severely thickened, severely calcified aortic valve cusps.  AV calcium score: 2886  Virtual Basal Annulus Measurements:  Maximum/Minimum Diameter: 32.1 x 23.6 mm  Perimeter: 86.3 mm  Area: 559 mm2  No significant LVOT calcifications.  Based on these measurements, the annulus would be suitable for a 29 mm Sapien 3 valve.  Sinus of Valsalva Measurements:  Non-coronary: 36 mm  Right - coronary: 36 mm  Left - coronary: 36 mm  Sinus of Valsalva Height:  Left: 20.4 mm  Right:22.4 mm  Aorta: Conventional 3 vessel branch pattern of aortic arch. No coarctation of the aorta. Minimal aortic atherosclerosis.  Sinotubular Junction: 30 mm  Ascending Thoracic  Aorta: 35 mm  Aortic Arch: 29 mm  Descending Thoracic Aorta: 27 mm  Coronary Artery Height above Annulus:  Left Main: 15.1 mm  Right Coronary: 18.4 mm  Coronary Arteries: Coronary calcium score 96, 49th percentile for age and sex matched peers.  Optimum Fluoroscopic Angle for Delivery: LAO 3 CAU 3  IMPRESSION: 1. Bicuspid aortic valve with partial fusion of left and right coronary cusps and raphe present. Severely reduced cusp separation. Severely thickened, severely calcified aortic valve cusps.  2. AV calcium score: 2886  3. Annulus area 559 mm2, suitable for 29 mm Sapien 3 valve.  4. Sufficient coronary artery height from annulus.  5. Normal caliber ascending thoracic aorta.  6. Optimum Fluoroscopic Angle for Delivery: LAO 3 CAU 3   Electronically Signed By: GCherlynn KaiserOn: 08/22/2020 13:17   Addended by [name] MD on 08/22/2020 1:19 PM    Study Result  Narrative & Impression  EXAM: OVER-READ INTERPRETATION CT CHEST  The following report is an over-read performed by radiologist Dr. DVinnie Langtonof [facility], [place] 08/20/2020. This over-read does not include interpretation of cardiac or coronary anatomy or pathology. The coronary calcium score/coronary CTA interpretation by the cardiologist is attached.  COMPARISON: None.  FINDINGS: Extracardiac findings will be described separately under dictation for contemporaneously obtained CTA chest, abdomen and pelvis.  IMPRESSION: Please see separate dictation for contemporaneously obtained CTA chest, abdomen and pelvis dated 08/20/2020 for full description of relevant extracardiac findings.  Electronically Signed: By: DVinnie LangtonM.D. On: 08/20/2020 11:26     Narrative & Impression  CLINICAL DATA: 65year old male with history of aortic stenosis. Preprocedural study prior to potential transcatheter aortic valve replacement  (TAVR) procedure.  EXAM: CT ANGIOGRAPHY CHEST, ABDOMEN AND PELVIS  TECHNIQUE: Multidetector CT imaging through the chest, abdomen and pelvis was performed using the standard protocol during bolus administration of intravenous contrast. Multiplanar reconstructed images and MIPs were obtained and reviewed to evaluate the vascular anatomy.  CONTRAST: 1027mOMNIPAQUE IOHEXOL 350 MG/ML SOLN  COMPARISON: No prior chest CT. CT of the abdomen 12/18/2016.  FINDINGS: CTA CHEST FINDINGS  Cardiovascular: Heart size is normal. There is no significant pericardial fluid, thickening or pericardial calcification. There is aortic atherosclerosis, as well as atherosclerosis of the great vessels  of the mediastinum and the coronary arteries, including calcified atherosclerotic plaque in the left anterior descending and right coronary arteries. Severe thickening calcification of the aortic valve.  Mediastinum/Lymph Nodes: No pathologically enlarged mediastinal or hilar lymph nodes. Esophagus is unremarkable in appearance. No axillary lymphadenopathy.  Lungs/Pleura: No suspicious appearing pulmonary nodules or masses are noted. No acute consolidative airspace disease. No pleural effusions.  Musculoskeletal/Soft Tissues: There are no aggressive appearing lytic or blastic lesions noted in the visualized portions of the skeleton.  CTA ABDOMEN AND PELVIS FINDINGS  Hepatobiliary: In the right lobe of the liver there is a 2.3 x 1.5 cm hypervascular lesion (axial image 116 of series 4), incompletely characterized. No other suspicious appearing cystic or solid hepatic lesions. No intra or extrahepatic biliary ductal dilatation. Gallbladder is normal in appearance.  Pancreas: No pancreatic mass. No pancreatic ductal dilatation. No pancreatic or peripancreatic fluid collections or inflammatory changes.  Spleen: Unremarkable.  Adrenals/Urinary Tract: Bilateral kidneys and the right  adrenal gland are normal in appearance. 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 No hydroureteronephrosis. Urinary bladder is normal in appearance.  Stomach/Bowel: The appearance of the stomach is normal. There is no pathologic dilatation of small bowel or colon. Several colonic diverticulae are noted, without surrounding inflammatory changes to suggest an acute diverticulitis at this time. Appendicoliths at the neck of the appendix. Appendix is otherwise normal in appearance.  Vascular/Lymphatic: Minimal atherosclerotic calcifications in the abdominal aorta, without evidence of aneurysm or dissection in the abdominal or pelvic vasculature. Vascular findings and measurements pertinent to potential TAVR procedure, as detailed below. No lymphadenopathy noted in the abdomen or pelvis.  Reproductive: Prostate gland and seminal vesicles are unremarkable in appearance.  Other: No significant volume of ascites. No pneumoperitoneum.  Musculoskeletal: There are no aggressive appearing lytic or blastic lesions noted in the visualized portions of the skeleton.  VASCULAR MEASUREMENTS PERTINENT TO TAVR:  AORTA:  Minimal Aortic Diameter-19 x 17 mm  Severity of Aortic Calcification-minimal  RIGHT PELVIS:  Right Common Iliac Artery -  Minimal Diameter-12.5 x 12.1 mm  Tortuosity-mild  Calcification-none  Right External Iliac Artery -  Minimal Diameter-9.9 x 10.2 mm  Tortuosity-severe  Calcification-none  Right Common Femoral Artery -  Minimal Diameter-9.5 x 9.7 mm  Tortuosity-mild  Calcification-none  LEFT PELVIS:  Left Common Iliac Artery -  Minimal Diameter-11.6 x 11.4 mm  Tortuosity-mild  Calcification-none  Left External Iliac Artery -  Minimal Diameter-10.4 x 10.4  mm  Tortuosity-moderate  Calcification-none  Left Common Femoral Artery -  Minimal Diameter-10.7 x 9.9 mm  Tortuosity-mild  Calcification-none  Review of the MIP images confirms the above findings.  IMPRESSION: 1. Vascular findings and measurements pertinent to potential TAVR procedure, as detailed above. 2. Severe thickening calcification of the aortic valve, compatible with reported clinical history of severe aortic stenosis. 3. Aortic atherosclerosis, in addition to 2 vessel coronary artery disease. Please note that although the presence of coronary artery calcium documents the presence of coronary artery disease, the severity of this disease and any potential stenosis cannot be assessed on this non-gated CT examination. Assessment for potential risk factor modification, dietary therapy or pharmacologic therapy may be warranted, if clinically indicated. 4. 2.3 x 1.5 cm hypervascular lesion in the right lobe of the liver, not evident on prior examination from 2018. This may represent a benign perfusion anomaly or flash fill cavernous hemangioma, however, the possibility of  an aggressive hypervascular lesion is not excluded. Definitive characterization with follow-up nonemergent abdominal MRI with and without IV gadolinium is recommended in the near future to better evaluate this finding. 5. Colonic diverticulosis without evidence of acute diverticulitis at this time. 6. Appendicoliths at the neck of the appendix. Currently, the appendix is otherwise normal in appearance. 7. 1.5 cm left adrenal nodule, similar in retrospect to remote prior study from 2018, favored to represent a benign lesions such as a lipid poor adenoma. Attention at time of forthcoming abdominal MRI is recommended for additional characterization.   Electronically Signed By: Vinnie Langton M.D. On: 08/20/2020 13:20    [facility] Adult Cardiac Surgery Database Version 4.20 RISK  SCORES Procedure: Isolated AVR Risk of Mortality: 0.922% Renal Failure: 1.713% Permanent Stroke: 0.780% Prolonged Ventilation: 3.379% DSW Infection: 0.085% Reoperation: 3.459% Morbidity or Mortality: 6.387% Short Length of Stay: 59.499% Long Length of Stay: 1.956%  Impression:  This 64 year old gentleman has stage D, severe, symptomatic bicuspid aortic valve stenosis with New York Heart Association class I-II symptoms consistent with chronic diastolic congestive heart failure.  He reports some chest tightness at rest as well as with exertion and has recently noticed decreased exertional tolerance.  He has also had episodes of positional dizziness when standing up or bending over.  I have personally reviewed his 2D echocardiogram, cardiac catheterization, and CTA studies.  His echocardiogram shows a bicuspid aortic valve with severe leaflet calcification and restricted mobility.  His mean gradient is 41 mmHg consistent with severe aortic stenosis.  Left ventricular ejection fraction is 55 to 60% with severe concentric LVH and grade 1 diastolic dysfunction.  Cardiac catheterization shows no coronary disease.  The peak to peak gradient was measured at 44 mmHg with a mean gradient of 34 mmHg.  I agree that aortic valve replacement is indicated to prevent progressive left ventricular deterioration.  He already has severe concentric LVH.  His symptoms are still relatively mild although he has noted a recent decrease in his exertional tolerance.  The patient and his wife for counseled at length regarding treatment alternatives for management of severe symptomatic aortic stenosis. The risks and benefits of surgical intervention has been discussed in detail. Long-term prognosis with medical therapy was discussed. Alternative approaches such as conventional surgical aortic valve replacement, transcatheter aortic valve replacement, and palliative medical therapy were compared and contrasted at  length. This discussion was placed in the context of the patient's own specific clinical presentation and past medical history. All of their questions have been addressed.  He said that he is not interested in open surgery because multiple family members have had open heart surgery previously and had a tough time with it.  I think he is a reasonable candidate for TAVR although I stressed the risk of structural valve deterioration and the fact that we do not have a good long-term data on durability with these valves.  He understands and would like to proceed with TAVR if that is an option.  Following the decision to proceed with transcatheter aortic valve replacement, a discussion was held regarding what types of management strategies would be attempted intraoperatively in the event of life-threatening complications, including whether or not the patient would be considered a candidate for the use of cardiopulmonary bypass and/or conversion to open sternotomy for attempted surgical intervention.  He would be a candidate for emergent sternotomy to manage any intraoperative complications.  The patient is aware of the fact that transient use of cardiopulmonary bypass may be necessary. The patient  has been advised of a variety of complications that might develop including but not limited to risks of death, stroke, paravalvular leak, aortic dissection or other major vascular complications, aortic annulus rupture, device embolization, cardiac rupture or perforation, mitral regurgitation, acute myocardial infarction, arrhythmia, heart block or bradycardia requiring permanent pacemaker placement, congestive heart failure, respiratory failure, renal failure, pneumonia, infection, other late complications related to structural valve deterioration or migration, or other complications that might ultimately cause a temporary or permanent loss of functional independence or other long term morbidity. The patient provides full  informed consent for the procedure as described and all questions were answered.     Plan:  Transfemoral transcatheter aortic valve replacement.       Gaye Pollack, MD

## 2020-10-28 NOTE — Progress Notes (Addendum)
Surgical Instructions    Your procedure is scheduled on Tuesday, May 17th, 2022.  Report to [facility] Main Entrance "A" at 11:45 A.M., then check in with the Admitting office.  Call this number if you have problems the morning of surgery:  [phone]   If you have any questions prior to your surgery date call [phone]: Open Monday-Friday 8am-4pm    Remember:  Do not eat or drink after midnight the night before your surgery    Stop Metformin on 05/15 (Sunday). You will take your last dose on 05/14 (Saturday). Continue taking all other medications without change through the day before surgery. On the morning of surgery do not take any medication.             Do not wear jewelry.            Do not wear lotions, powders, colognes, or deodorant.            Men may shave face and neck.            Do not bring valuables to the hospital.            [facility] is not responsible for any belongings or valuables.  Do NOT Smoke (Tobacco/Vaping) or drink Alcohol 24 hours prior to your procedure If you use a CPAP at night, you may bring all equipment for your overnight stay.   Contacts, glasses, dentures or bridgework may not be worn into surgery, please bring cases for these belongings   For patients admitted to the hospital, discharge time will be determined by your treatment team.   Patients discharged the day of surgery will not be allowed to drive home, and someone needs to stay with them for 24 hours.    Special instructions:   [place]- Preparing For Surgery  Before surgery, you can play an important role. Because skin is not sterile, your skin needs to be as free of germs as possible. You can reduce the number of germs on your skin by washing with CHG (chlorahexidine gluconate) Soap before surgery.  CHG is an antiseptic cleaner which kills germs and bonds with the skin to continue killing germs even after washing.    Oral Hygiene is also important to reduce your risk of  infection.  Remember - BRUSH YOUR TEETH THE MORNING OF SURGERY WITH YOUR REGULAR TOOTHPASTE  Please do not use if you have an allergy to CHG or antibacterial soaps. If your skin becomes reddened/irritated stop using the CHG.  Do not shave (including legs and underarms) for at least 48 hours prior to first CHG shower. It is OK to shave your face.  Please follow these instructions carefully.   1. Shower the NIGHT BEFORE SURGERY and the MORNING OF SURGERY  2. If you chose to wash your hair, wash your hair first as usual with your normal shampoo.  3. After you shampoo, rinse your hair and body thoroughly to remove the shampoo.  4. Use CHG Soap as you would any other liquid soap. You can apply CHG directly to the skin and wash gently with a scrungie or a clean washcloth.   5. Apply the CHG Soap to your body ONLY FROM THE NECK DOWN.  Do not use on open wounds or open sores. Avoid contact with your eyes, ears, mouth and genitals (private parts). Wash Face and genitals (private parts)  with your normal soap.   6. Wash thoroughly, paying special attention to the area where your surgery  will be performed.  7. Thoroughly rinse your body with warm water from the neck down.  8. DO NOT shower/wash with your normal soap after using and rinsing off the CHG Soap.  9. Pat yourself dry with a CLEAN TOWEL.  10. Wear CLEAN PAJAMAS to bed the night before surgery  11. Place CLEAN SHEETS on your bed the night before your surgery  12. DO NOT SLEEP WITH PETS.   Day of Surgery: Take a shower with CHG soap. Wear Clean/Comfortable clothing the morning of surgery Do not apply any deodorants/lotions.   Remember to brush your teeth WITH YOUR REGULAR TOOTHPASTE.   Please read over the following fact sheets that you were given.

## 2020-10-29 ENCOUNTER — Inpatient Hospital Stay (HOSPITAL_COMMUNITY): Payer: Medicare Other | Admitting: Anesthesiology

## 2020-10-29 ENCOUNTER — Inpatient Hospital Stay (HOSPITAL_COMMUNITY): Payer: Medicare Other

## 2020-10-29 ENCOUNTER — Other Ambulatory Visit: Payer: Self-pay | Admitting: Physician Assistant

## 2020-10-29 ENCOUNTER — Encounter (HOSPITAL_COMMUNITY): Payer: Self-pay | Admitting: Cardiovascular Disease

## 2020-10-29 ENCOUNTER — Encounter (HOSPITAL_COMMUNITY)
Admission: RE | Disposition: A | Discharge status: Home / Self Care | Payer: Self-pay | Source: Home / Self Care | Attending: Cardiovascular Disease

## 2020-10-29 ENCOUNTER — Inpatient Hospital Stay (HOSPITAL_COMMUNITY)
Admission: RE | Admit: 2020-10-29 | Discharge: 2020-10-30 | DRG: 267 | Disposition: A | Discharge status: Home / Self Care | Payer: Medicare Other | Attending: Cardiovascular Disease | Admitting: Cardiovascular Disease

## 2020-10-29 ENCOUNTER — Inpatient Hospital Stay (HOSPITAL_COMMUNITY): Payer: Medicare Other | Admitting: Physician Assistant

## 2020-10-29 DIAGNOSIS — Z79899 Other long term (current) drug therapy: Secondary | ICD-10-CM

## 2020-10-29 DIAGNOSIS — Z8249 Family history of ischemic heart disease and other diseases of the circulatory system: Secondary | ICD-10-CM | POA: Diagnosis not present

## 2020-10-29 DIAGNOSIS — I350 Nonrheumatic aortic (valve) stenosis: Secondary | ICD-10-CM

## 2020-10-29 DIAGNOSIS — Z7982 Long term (current) use of aspirin: Secondary | ICD-10-CM | POA: Diagnosis not present

## 2020-10-29 DIAGNOSIS — Z952 Presence of prosthetic heart valve: Secondary | ICD-10-CM

## 2020-10-29 DIAGNOSIS — I5032 Chronic diastolic (congestive) heart failure: Secondary | ICD-10-CM | POA: Diagnosis not present

## 2020-10-29 DIAGNOSIS — E785 Hyperlipidemia, unspecified: Secondary | ICD-10-CM | POA: Diagnosis not present

## 2020-10-29 DIAGNOSIS — E669 Obesity, unspecified: Secondary | ICD-10-CM | POA: Diagnosis present

## 2020-10-29 DIAGNOSIS — I10 Essential (primary) hypertension: Secondary | ICD-10-CM | POA: Diagnosis present

## 2020-10-29 DIAGNOSIS — Z8616 Personal history of COVID-19: Secondary | ICD-10-CM

## 2020-10-29 DIAGNOSIS — E119 Type 2 diabetes mellitus without complications: Secondary | ICD-10-CM | POA: Diagnosis present

## 2020-10-29 DIAGNOSIS — E118 Type 2 diabetes mellitus with unspecified complications: Secondary | ICD-10-CM | POA: Diagnosis present

## 2020-10-29 DIAGNOSIS — E1169 Type 2 diabetes mellitus with other specified complication: Secondary | ICD-10-CM | POA: Diagnosis present

## 2020-10-29 DIAGNOSIS — K760 Fatty (change of) liver, not elsewhere classified: Secondary | ICD-10-CM | POA: Diagnosis present

## 2020-10-29 DIAGNOSIS — I110 Hypertensive heart disease with heart failure: Secondary | ICD-10-CM | POA: Diagnosis present

## 2020-10-29 DIAGNOSIS — Z7984 Long term (current) use of oral hypoglycemic drugs: Secondary | ICD-10-CM | POA: Diagnosis not present

## 2020-10-29 DIAGNOSIS — Q230 Congenital stenosis of aortic valve: Secondary | ICD-10-CM

## 2020-10-29 DIAGNOSIS — G479 Sleep disorder, unspecified: Secondary | ICD-10-CM | POA: Diagnosis present

## 2020-10-29 DIAGNOSIS — E278 Other specified disorders of adrenal gland: Secondary | ICD-10-CM | POA: Diagnosis present

## 2020-10-29 DIAGNOSIS — G4733 Obstructive sleep apnea (adult) (pediatric): Secondary | ICD-10-CM | POA: Diagnosis present

## 2020-10-29 DIAGNOSIS — I6529 Occlusion and stenosis of unspecified carotid artery: Secondary | ICD-10-CM | POA: Diagnosis present

## 2020-10-29 DIAGNOSIS — I2510 Atherosclerotic heart disease of native coronary artery without angina pectoris: Secondary | ICD-10-CM | POA: Diagnosis not present

## 2020-10-29 DIAGNOSIS — Z006 Encounter for examination for normal comparison and control in clinical research program: Secondary | ICD-10-CM | POA: Diagnosis not present

## 2020-10-29 DIAGNOSIS — K219 Gastro-esophageal reflux disease without esophagitis: Secondary | ICD-10-CM | POA: Diagnosis present

## 2020-10-29 HISTORY — DX: Presence of prosthetic heart valve: Z95.2

## 2020-10-29 HISTORY — PX: TRANSCATHETER AORTIC VALVE REPLACEMENT, TRANSFEMORAL: SHX6400

## 2020-10-29 HISTORY — PX: ULTRASOUND GUIDANCE FOR VASCULAR ACCESS: SHX6516

## 2020-10-29 HISTORY — DX: Nonrheumatic aortic (valve) stenosis: I35.0

## 2020-10-29 HISTORY — PX: INTRAOPERATIVE TRANSTHORACIC ECHOCARDIOGRAM: SHX6523

## 2020-10-29 LAB — POCT I-STAT, CHEM 8
BUN: 15 mg/dL (ref 8–23)
BUN: 15 mg/dL (ref 8–23)
BUN: 14 mg/dL (ref 8–23)
Calcium, Ion: 1.26 mmol/L (ref 1.15–1.40)
Calcium, Ion: 1.28 mmol/L (ref 1.15–1.40)
Calcium, Ion: 1.29 mmol/L (ref 1.15–1.40)
Chloride: 102 mmol/L (ref 98–111)
Chloride: 102 mmol/L (ref 98–111)
Chloride: 104 mmol/L (ref 98–111)
Creatinine, Ser: 0.6 mg/dL — ABNORMAL LOW (ref 0.61–1.24)
Creatinine, Ser: 0.6 mg/dL — ABNORMAL LOW (ref 0.61–1.24)
Creatinine, Ser: 0.7 mg/dL (ref 0.61–1.24)
Glucose, Bld: 151 mg/dL — ABNORMAL HIGH (ref 70–99)
Glucose, Bld: 151 mg/dL — ABNORMAL HIGH (ref 70–99)
Glucose, Bld: 136 mg/dL — ABNORMAL HIGH (ref 70–99)
HCT: 42 % (ref 39.0–52.0)
HCT: 43 % (ref 39.0–52.0)
HCT: 42 % (ref 39.0–52.0)
Hemoglobin: 14.3 g/dL (ref 13.0–17.0)
Hemoglobin: 14.6 g/dL (ref 13.0–17.0)
Hemoglobin: 14.3 g/dL (ref 13.0–17.0)
Potassium: 3.8 mmol/L (ref 3.5–5.1)
Potassium: 4 mmol/L (ref 3.5–5.1)
Potassium: 4.1 mmol/L (ref 3.5–5.1)
Sodium: 140 mmol/L (ref 135–145)
Sodium: 140 mmol/L (ref 135–145)
Sodium: 141 mmol/L (ref 135–145)
TCO2: 26 mmol/L (ref 22–32)
TCO2: 28 mmol/L (ref 22–32)
TCO2: 25 mmol/L (ref 22–32)

## 2020-10-29 LAB — ECHOCARDIOGRAM LIMITED
AR max vel: 2.28 cm2
AV Area VTI: 2.34 cm2
AV Area mean vel: 2.05 cm2
AV Mean grad: 23.3 mmHg
AV Peak grad: 32 mmHg
Ao pk vel: 2.83 m/s
S' Lateral: 3 cm

## 2020-10-29 LAB — ABO/RH: ABO/RH(D): O POS

## 2020-10-29 LAB — GLUCOSE, CAPILLARY
Glucose-Capillary: 137 mg/dL — ABNORMAL HIGH (ref 70–99)
Glucose-Capillary: 115 mg/dL — ABNORMAL HIGH (ref 70–99)
Glucose-Capillary: 180 mg/dL — ABNORMAL HIGH (ref 70–99)

## 2020-10-29 LAB — POCT I-STAT 7, (LYTES, BLD GAS, ICA,H+H)
Acid-Base Excess: 1 mmol/L (ref 0.0–2.0)
Bicarbonate: 27.1 mmol/L (ref 20.0–28.0)
Calcium, Ion: 1.27 mmol/L (ref 1.15–1.40)
HCT: 45 % (ref 39.0–52.0)
Hemoglobin: 15.3 g/dL (ref 13.0–17.0)
O2 Saturation: 99 %
Potassium: 3.8 mmol/L (ref 3.5–5.1)
Sodium: 140 mmol/L (ref 135–145)
TCO2: 28 mmol/L (ref 22–32)
pCO2 arterial: 45.8 mmHg (ref 32.0–48.0)
pH, Arterial: 7.38 (ref 7.350–7.450)
pO2, Arterial: 145 mmHg — ABNORMAL HIGH (ref 83.0–108.0)

## 2020-10-29 SURGERY — IMPLANTATION, AORTIC VALVE, TRANSCATHETER, FEMORAL APPROACH
Anesthesia: Monitor Anesthesia Care | Site: Groin | Laterality: Left

## 2020-10-29 MED ORDER — VANCOMYCIN HCL 1250 MG/250ML IV SOLN
1250.0000 mg | Freq: Once | INTRAVENOUS | Status: AC
  Administered 2020-10-30: 1250 mg via INTRAVENOUS
  Filled 2020-10-29: qty 250

## 2020-10-29 MED ORDER — MORPHINE SULFATE (PF) 2 MG/ML IV SOLN: 1.0000 mg | INTRAVENOUS | Status: DC | PRN

## 2020-10-29 MED ORDER — LACTATED RINGERS IV SOLN: INTRAVENOUS | Status: DC

## 2020-10-29 MED ORDER — ORAL CARE MOUTH RINSE: 15.0000 mL | Freq: Once | OROMUCOSAL | Status: DC

## 2020-10-29 MED ORDER — CHLORHEXIDINE GLUCONATE 0.12 % MT SOLN: 15.0000 mL | Freq: Once | OROMUCOSAL | Status: AC

## 2020-10-29 MED ORDER — CHLORHEXIDINE GLUCONATE 4 % EX LIQD: 60.0000 mL | Freq: Once | CUTANEOUS | Status: DC

## 2020-10-29 MED ORDER — HEPARIN SODIUM (PORCINE) 1000 UNIT/ML IJ SOLN
INTRAMUSCULAR | Status: DC | PRN
  Administered 2020-10-29: 15000 [IU] via INTRAVENOUS

## 2020-10-29 MED ORDER — PHENYLEPHRINE HCL-NACL 10-0.9 MG/250ML-% IV SOLN
INTRAVENOUS | Status: DC | PRN
  Administered 2020-10-29: 25 ug/min via INTRAVENOUS

## 2020-10-29 MED ORDER — SODIUM CHLORIDE 0.9% FLUSH: 3.0000 mL | INTRAVENOUS | Status: DC | PRN

## 2020-10-29 MED ORDER — HEPARIN SODIUM (PORCINE) 1000 UNIT/ML IJ SOLN
INTRAMUSCULAR | Status: AC
  Filled 2020-10-29: qty 2

## 2020-10-29 MED ORDER — SODIUM CHLORIDE 0.9% FLUSH
3.0000 mL | Freq: Two times a day (BID) | INTRAVENOUS | Status: DC
  Administered 2020-10-30 (×2): 3 mL via INTRAVENOUS

## 2020-10-29 MED ORDER — CLOPIDOGREL BISULFATE 75 MG PO TABS
75.0000 mg | ORAL_TABLET | Freq: Every day | ORAL | Status: DC
  Administered 2020-10-30: 75 mg via ORAL
  Filled 2020-10-29: qty 1

## 2020-10-29 MED ORDER — HEPARIN SODIUM (PORCINE) 1000 UNIT/ML IJ SOLN
INTRAMUSCULAR | Status: AC
  Filled 2020-10-29: qty 1

## 2020-10-29 MED ORDER — PROPOFOL 10 MG/ML IV BOLUS
INTRAVENOUS | Status: DC | PRN
  Administered 2020-10-29 (×2): 20 mg via INTRAVENOUS

## 2020-10-29 MED ORDER — ONDANSETRON HCL 4 MG/2ML IJ SOLN
INTRAMUSCULAR | Status: AC
  Filled 2020-10-29: qty 2

## 2020-10-29 MED ORDER — CEFAZOLIN SODIUM-DEXTROSE 2-4 GM/100ML-% IV SOLN: 2.0000 g | Freq: Three times a day (TID) | INTRAVENOUS | Status: DC

## 2020-10-29 MED ORDER — FENTANYL CITRATE (PF) 250 MCG/5ML IJ SOLN
INTRAMUSCULAR | Status: AC
  Filled 2020-10-29: qty 5

## 2020-10-29 MED ORDER — LACTATED RINGERS IV SOLN: INTRAVENOUS | Status: DC | PRN

## 2020-10-29 MED ORDER — INSULIN ASPART 100 UNIT/ML IJ SOLN
0.0000 [IU] | Freq: Three times a day (TID) | INTRAMUSCULAR | Status: DC
  Administered 2020-10-29: 4 [IU] via SUBCUTANEOUS

## 2020-10-29 MED ORDER — ONDANSETRON HCL 4 MG/2ML IJ SOLN: 4.0000 mg | Freq: Four times a day (QID) | INTRAMUSCULAR | Status: DC | PRN

## 2020-10-29 MED ORDER — CHLORHEXIDINE GLUCONATE 4 % EX LIQD: 30.0000 mL | CUTANEOUS | Status: DC

## 2020-10-29 MED ORDER — TRAMADOL HCL 50 MG PO TABS: 50.0000 mg | ORAL_TABLET | ORAL | Status: DC | PRN

## 2020-10-29 MED ORDER — PANTOPRAZOLE SODIUM 40 MG PO TBEC
40.0000 mg | DELAYED_RELEASE_TABLET | Freq: Every day | ORAL | Status: DC
  Administered 2020-10-29 – 2020-10-30 (×2): 40 mg via ORAL
  Filled 2020-10-29 (×2): qty 1

## 2020-10-29 MED ORDER — ACETAMINOPHEN 325 MG PO TABS
650.0000 mg | ORAL_TABLET | Freq: Four times a day (QID) | ORAL | Status: DC | PRN
  Administered 2020-10-29 – 2020-10-30 (×2): 650 mg via ORAL
  Filled 2020-10-29 (×2): qty 2

## 2020-10-29 MED ORDER — ROSUVASTATIN CALCIUM 5 MG PO TABS
5.0000 mg | ORAL_TABLET | Freq: Every day | ORAL | Status: DC
  Administered 2020-10-29 – 2020-10-30 (×2): 5 mg via ORAL
  Filled 2020-10-29 (×2): qty 1

## 2020-10-29 MED ORDER — NITROGLYCERIN IN D5W 200-5 MCG/ML-% IV SOLN: 0.0000 ug/min | INTRAVENOUS | Status: DC

## 2020-10-29 MED ORDER — ONDANSETRON HCL 4 MG/2ML IJ SOLN
INTRAMUSCULAR | Status: DC | PRN
  Administered 2020-10-29: 4 mg via INTRAVENOUS

## 2020-10-29 MED ORDER — SODIUM CHLORIDE 0.9 % IV SOLN: 250.0000 mL | INTRAVENOUS | Status: DC | PRN

## 2020-10-29 MED ORDER — CLOPIDOGREL BISULFATE 75 MG PO TABS: 75.0000 mg | ORAL_TABLET | Freq: Every day | ORAL | Status: DC

## 2020-10-29 MED ORDER — SODIUM CHLORIDE 0.9 % IV SOLN: INTRAVENOUS | Status: AC

## 2020-10-29 MED ORDER — CHLORHEXIDINE GLUCONATE 0.12 % MT SOLN: 15.0000 mL | Freq: Once | OROMUCOSAL | Status: DC

## 2020-10-29 MED ORDER — ASPIRIN EC 81 MG PO TBEC
81.0000 mg | DELAYED_RELEASE_TABLET | Freq: Every evening | ORAL | Status: DC
  Administered 2020-10-29: 81 mg via ORAL
  Filled 2020-10-29: qty 1

## 2020-10-29 MED ORDER — SODIUM CHLORIDE 0.9 % IV SOLN
INTRAVENOUS | Status: DC | PRN
  Administered 2020-10-29: 500 mL

## 2020-10-29 MED ORDER — MIDAZOLAM HCL 5 MG/5ML IJ SOLN
INTRAMUSCULAR | Status: DC | PRN
  Administered 2020-10-29: 2 mg via INTRAVENOUS

## 2020-10-29 MED ORDER — CHLORHEXIDINE GLUCONATE 0.12 % MT SOLN
OROMUCOSAL | Status: AC
  Administered 2020-10-29: 15 mL via OROMUCOSAL
  Filled 2020-10-29: qty 15

## 2020-10-29 MED ORDER — PHENYLEPHRINE HCL-NACL 20-0.9 MG/250ML-% IV SOLN: 0.0000 ug/min | INTRAVENOUS | Status: DC

## 2020-10-29 MED ORDER — SODIUM CHLORIDE 0.9 % IV SOLN
INTRAVENOUS | Status: AC
  Filled 2020-10-29 (×3): qty 1.2

## 2020-10-29 MED ORDER — OXYCODONE HCL 5 MG PO TABS: 5.0000 mg | ORAL_TABLET | ORAL | Status: DC | PRN

## 2020-10-29 MED ORDER — SODIUM CHLORIDE 0.9 % IV SOLN: INTRAVENOUS | Status: DC

## 2020-10-29 MED ORDER — ACETAMINOPHEN 650 MG RE SUPP: 650.0000 mg | Freq: Four times a day (QID) | RECTAL | Status: DC | PRN

## 2020-10-29 MED ORDER — CHLORHEXIDINE GLUCONATE 4 % EX LIQD
60.0000 mL | Freq: Once | CUTANEOUS | Status: DC
Start: 2020-10-30 — End: 2020-10-29

## 2020-10-29 MED ORDER — LIDOCAINE HCL (PF) 1 % IJ SOLN
INTRAMUSCULAR | Status: AC
  Filled 2020-10-29: qty 30

## 2020-10-29 MED ORDER — FLUTICASONE PROPIONATE 50 MCG/ACT NA SUSP
1.0000 | Freq: Every day | NASAL | Status: DC | PRN
  Filled 2020-10-29: qty 16

## 2020-10-29 MED ORDER — MIDAZOLAM HCL 2 MG/2ML IJ SOLN
INTRAMUSCULAR | Status: AC
  Filled 2020-10-29: qty 2

## 2020-10-29 MED ORDER — PROTAMINE SULFATE 10 MG/ML IV SOLN
INTRAVENOUS | Status: AC
  Filled 2020-10-29: qty 10

## 2020-10-29 MED ORDER — IODIXANOL 320 MG/ML IV SOLN
INTRAVENOUS | Status: DC | PRN
  Administered 2020-10-29: 55 mg

## 2020-10-29 MED ORDER — PROTAMINE SULFATE 10 MG/ML IV SOLN
INTRAVENOUS | Status: DC | PRN
  Administered 2020-10-29: 150 mg via INTRAVENOUS

## 2020-10-29 MED ORDER — PROPOFOL 500 MG/50ML IV EMUL
INTRAVENOUS | Status: DC | PRN
  Administered 2020-10-29: 25 ug/kg/min via INTRAVENOUS

## 2020-10-29 SURGICAL SUPPLY — 86 items
ADH SKN CLS APL DERMABOND .7 (GAUZE/BANDAGES/DRESSINGS) ×3
APL PRP STRL LF DISP 70% ISPRP (MISCELLANEOUS) ×3
BAG DECANTER FOR FLEXI CONT (MISCELLANEOUS) IMPLANT
BAG SNAP BAND KOVER 36X36 (MISCELLANEOUS) ×4 IMPLANT
BLADE CLIPPER SURG (BLADE) IMPLANT
BLADE OSCILLATING /SAGITTAL (BLADE) IMPLANT
BLADE STERNUM SYSTEM 6 (BLADE) IMPLANT
BLADE SURG 10 STRL SS (BLADE) IMPLANT
CABLE ADAPT CONN TEMP 6FT (ADAPTER) ×4 IMPLANT
CATH DIAG EXPO 6F AL1 (CATHETERS) IMPLANT
CATH DIAG EXPO 6F VENT PIG 145 (CATHETERS) ×8 IMPLANT
CATH EXTERNAL FEMALE PUREWICK (CATHETERS) IMPLANT
CATH INFINITI 6F AL2 (CATHETERS) IMPLANT
CATH S G BIP PACING (CATHETERS) ×4 IMPLANT
CHLORAPREP W/TINT 26 (MISCELLANEOUS) ×4 IMPLANT
CLIP VESOCCLUDE MED 24/CT (CLIP) IMPLANT
CLIP VESOCCLUDE SM WIDE 24/CT (CLIP) IMPLANT
CLOSURE MYNX CONTROL 6F/7F (Vascular Products) ×2 IMPLANT
CNTNR URN SCR LID CUP LEK RST (MISCELLANEOUS) ×6 IMPLANT
CONT SPEC 4OZ STRL OR WHT (MISCELLANEOUS) ×8
COVER BACK TABLE 80X110 HD (DRAPES) ×4 IMPLANT
COVER WAND RF STERILE (DRAPES) ×4 IMPLANT
DECANTER SPIKE VIAL GLASS SM (MISCELLANEOUS) ×4 IMPLANT
DERMABOND ADVANCED (GAUZE/BANDAGES/DRESSINGS) ×1
DERMABOND ADVANCED .7 DNX12 (GAUZE/BANDAGES/DRESSINGS) ×3 IMPLANT
DEVICE CLOSURE PERCLS PRGLD 6F (VASCULAR PRODUCTS) ×6 IMPLANT
DRAPE INCISE IOBAN 66X45 STRL (DRAPES) IMPLANT
DRSG TEGADERM 4X4.5 CHG (GAUZE/BANDAGES/DRESSINGS) ×4 IMPLANT
DRSG TEGADERM 4X4.75 (GAUZE/BANDAGES/DRESSINGS) ×8 IMPLANT
ELECT CAUTERY BLADE 6.4 (BLADE) IMPLANT
ELECT REM PT RETURN 9FT ADLT (ELECTROSURGICAL) ×8
ELECTRODE REM PT RTRN 9FT ADLT (ELECTROSURGICAL) ×6 IMPLANT
FELT TEFLON 6X6 (MISCELLANEOUS) IMPLANT
GAUZE SPONGE 2X2 8PLY STRL LF (GAUZE/BANDAGES/DRESSINGS) ×2 IMPLANT
GAUZE SPONGE 4X4 12PLY STRL (GAUZE/BANDAGES/DRESSINGS) ×4 IMPLANT
GLOVE BIO SURGEON STRL SZ7.5 (GLOVE) ×4 IMPLANT
GLOVE BIO SURGEON STRL SZ8 (GLOVE) IMPLANT
GLOVE EUDERMIC 7 POWDERFREE (GLOVE) IMPLANT
GLOVE ORTHO TXT STRL SZ7.5 (GLOVE) IMPLANT
GOWN STRL REUS W/ TWL LRG LVL3 (GOWN DISPOSABLE) IMPLANT
GOWN STRL REUS W/ TWL XL LVL3 (GOWN DISPOSABLE) ×3 IMPLANT
GOWN STRL REUS W/TWL LRG LVL3 (GOWN DISPOSABLE)
GOWN STRL REUS W/TWL XL LVL3 (GOWN DISPOSABLE) ×4
GUIDEWIRE SAFE TJ AMPLATZ EXST (WIRE) ×4 IMPLANT
INSERT FOGARTY SM (MISCELLANEOUS) IMPLANT
KIT BASIN OR (CUSTOM PROCEDURE TRAY) ×4 IMPLANT
KIT HEART LEFT (KITS) ×4 IMPLANT
KIT SUCTION CATH 14FR (SUCTIONS) IMPLANT
KIT TURNOVER KIT B (KITS) ×4 IMPLANT
LOOP VESSEL MAXI BLUE (MISCELLANEOUS) IMPLANT
LOOP VESSEL MINI RED (MISCELLANEOUS) IMPLANT
NS IRRIG 1000ML POUR BTL (IV SOLUTION) ×4 IMPLANT
PACK ENDO MINOR (CUSTOM PROCEDURE TRAY) ×4 IMPLANT
PAD ARMBOARD 7.5X6 YLW CONV (MISCELLANEOUS) ×8 IMPLANT
PAD ELECT DEFIB RADIOL ZOLL (MISCELLANEOUS) ×4 IMPLANT
PENCIL BUTTON HOLSTER BLD 10FT (ELECTRODE) IMPLANT
PERCLOSE PROGLIDE 6F (VASCULAR PRODUCTS) ×8
POSITIONER HEAD DONUT 9IN (MISCELLANEOUS) ×4 IMPLANT
SET MICROPUNCTURE 5F STIFF (MISCELLANEOUS) ×4 IMPLANT
SHEATH BRITE TIP 7FR 35CM (SHEATH) ×4 IMPLANT
SHEATH PINNACLE 6F 10CM (SHEATH) ×4 IMPLANT
SHEATH PINNACLE 8F 10CM (SHEATH) ×4 IMPLANT
SLEEVE REPOSITIONING LENGTH 30 (MISCELLANEOUS) ×4 IMPLANT
SPONGE GAUZE 2X2 STER 10/PKG (GAUZE/BANDAGES/DRESSINGS) ×2
STOPCOCK MORSE 400PSI 3WAY (MISCELLANEOUS) ×8 IMPLANT
SUT ETHIBOND X763 2 0 SH 1 (SUTURE) IMPLANT
SUT GORETEX CV 4 TH 22 36 (SUTURE) IMPLANT
SUT GORETEX CV4 TH-18 (SUTURE) IMPLANT
SUT MNCRL AB 3-0 PS2 18 (SUTURE) IMPLANT
SUT PROLENE 5 0 C 1 36 (SUTURE) IMPLANT
SUT PROLENE 6 0 C 1 30 (SUTURE) IMPLANT
SUT SILK  1 MH (SUTURE) ×4
SUT SILK 1 MH (SUTURE) ×3 IMPLANT
SUT VIC AB 2-0 CT1 27 (SUTURE)
SUT VIC AB 2-0 CT1 TAPERPNT 27 (SUTURE) IMPLANT
SUT VIC AB 2-0 CTX 36 (SUTURE) IMPLANT
SUT VIC AB 3-0 SH 8-18 (SUTURE) IMPLANT
SYR 50ML LL SCALE MARK (SYRINGE) ×4 IMPLANT
SYR BULB IRRIG 60ML STRL (SYRINGE) IMPLANT
SYR MEDRAD MARK V 150ML (SYRINGE) ×4 IMPLANT
TOWEL GREEN STERILE (TOWEL DISPOSABLE) ×8 IMPLANT
TRANSDUCER W/STOPCOCK (MISCELLANEOUS) ×8 IMPLANT
TRAY FOLEY SLVR 16FR TEMP STAT (SET/KITS/TRAYS/PACK) IMPLANT
VALVE HEART TRANSCATH SZ3 29MM (Valve) ×2 IMPLANT
WIRE EMERALD 3MM-J .035X150CM (WIRE) ×4 IMPLANT
WIRE EMERALD 3MM-J .035X260CM (WIRE) ×4 IMPLANT

## 2020-10-29 NOTE — Progress Notes (Signed)
  Echocardiogram 2D Echocardiogram has been performed.  Evan [name] 10/29/2020, 4:40 PM

## 2020-10-29 NOTE — Anesthesia Preprocedure Evaluation (Signed)
Anesthesia Evaluation  Patient identified by MRN, date of birth, ID band Patient awake    Reviewed: Allergy & Precautions, H&P , NPO status , Patient's Chart, lab work & pertinent test results  Airway Mallampati: II   Neck ROM: full    Dental   Pulmonary sleep apnea ,    breath sounds clear to auscultation       Cardiovascular hypertension, + Valvular Problems/Murmurs AS  Rhythm:regular Rate:Normal  Severe AS   Neuro/Psych  Headaches, PSYCHIATRIC DISORDERS Depression    GI/Hepatic GERD  ,  Endo/Other  diabetes, Type 2  Renal/GU      Musculoskeletal   Abdominal   Peds  Hematology   Anesthesia Other Findings   Reproductive/Obstetrics                             Anesthesia Physical Anesthesia Plan  ASA: III  Anesthesia Plan: MAC   Post-op Pain Management:    Induction: Intravenous  PONV Risk Score and Plan: 1 and Ondansetron, Dexamethasone and Treatment may vary due to age or medical condition  Airway Management Planned: Simple Face Mask  Additional Equipment: Arterial line  Intra-op Plan:   Post-operative Plan:   Informed Consent: I have reviewed the patients History and Physical, chart, labs and discussed the procedure including the risks, benefits and alternatives for the proposed anesthesia with the patient or authorized representative who has indicated his/her understanding and acceptance.     Dental advisory given  Plan Discussed with: CRNA, Anesthesiologist and Surgeon  Anesthesia Plan Comments:         Anesthesia Quick Evaluation

## 2020-10-29 NOTE — Anesthesia Procedure Notes (Signed)
Procedure Name: MAC Date/Time: 10/29/2020 3:18 PM Performed by: Renato Shin, CRNA Pre-anesthesia Checklist: Patient identified, Emergency Drugs available, Suction available and Patient being monitored Patient Re-evaluated:Patient Re-evaluated prior to induction Oxygen Delivery Method: Simple face mask Preoxygenation: Pre-oxygenation with 100% oxygen Induction Type: IV induction Placement Confirmation: positive ETCO2 and breath sounds checked- equal and bilateral Dental Injury: Teeth and Oropharynx as per pre-operative assessment

## 2020-10-29 NOTE — Transfer of Care (Signed)
Immediate Anesthesia Transfer of Care Note  Patient: LAMONT TANT  Procedure(s) Performed: TRANSCATHETER AORTIC VALVE REPLACEMENT, TRANSFEMORAL (Bilateral Groin) ULTRASOUND GUIDANCE FOR VASCULAR ACCESS (Bilateral Groin) INTRAOPERATIVE TRANSTHORACIC ECHOCARDIOGRAM (Left Chest)  Patient Location: PACU and Cath Lab  Anesthesia Type:MAC  Level of Consciousness: awake and patient cooperative  Airway & Oxygen Therapy: Patient Spontanous Breathing and Patient connected to nasal cannula oxygen  Post-op Assessment: Report given to RN, Post -op Vital signs reviewed and stable and on neo gtt  Post vital signs: Reviewed and stable  Last Vitals:  Vitals Value Taken Time  BP    Temp 36.3 C 10/29/20 1707  Pulse 76 10/29/20 1707  Resp 19 10/29/20 1707  SpO2      Last Pain:  Vitals:   10/29/20 1707  TempSrc: Temporal  PainSc: 0-No pain      Patients Stated Pain Goal: 3 (89/21/19 4174)  Complications: No complications documented.

## 2020-10-29 NOTE — Op Note (Signed)
[facility]   MULTIDISCIPLINARY HEART VALVE TEAM   TAVR OPERATIVE NOTE   Date of Procedure:  10/29/2020  Preoperative Diagnosis: Severe Aortic Stenosis   Postoperative Diagnosis: Same   Procedure:    Transcatheter Aortic Valve Replacement - Percutaneous Right Transfemoral Approach  Edwards Sapien 3  THV (size 29 mm, model # 9600TFX, serial # [id number])   Co-Surgeons:  Gaye Pollack, MD and  Lauree Chandler, MD   Anesthesiologist:  A. Hodierne, MD  Echocardiographer:  Osborne Oman, MD  Pre-operative Echo Findings:  Severe aortic stenosis  Normal left ventricular systolic function  Post-operative Echo Findings:  No paravalvular leak  Normal left ventricular systolic function   BRIEF CLINICAL NOTE AND INDICATIONS FOR SURGERY  This 65 year old gentleman has stage D, severe, symptomatic bicuspid aortic valve stenosis with New York Heart Association class I-II symptoms consistent with chronic diastolic congestive heart failure. He reports some chest tightness at rest as well as with exertion and has recently noticed decreased exertional tolerance. He has also had episodes of positional dizziness when standing up or bending over. I have personally reviewed his 2D echocardiogram, cardiac catheterization, and CTA studies. His echocardiogram shows a bicuspid aortic valve with severe leaflet calcification and restricted mobility. His mean gradient is 41 mmHg consistent with severe aortic stenosis. Left ventricular ejection fraction is 55 to 60% with severe concentric LVH and grade 1 diastolic dysfunction. Cardiac catheterization shows no coronary disease. The peak to peak gradient was measured at 44 mmHg with a mean gradient of 34 mmHg. I agree that aortic valve replacement is indicated to prevent progressive left ventricular deterioration. He already has severe concentric LVH. His symptoms are still relatively mild although he has noted a recent  decrease in his exertional tolerance.  The patientand his wife forcounseled at length regarding treatment alternatives for management of severe symptomatic aortic stenosis. The risks and benefits of surgical intervention has been discussed in detail. Long-term prognosis with medical therapy was discussed. Alternative approaches such as conventional surgical aortic valve replacement, transcatheter aortic valve replacement, and palliative medical therapy were compared and contrasted at length. This discussion was placed in the context of the patient's own specific clinical presentation and past medical history. All of their questions have been addressed. He said that he is not interested in open surgery because multiple family members have had open heart surgery previously and had a tough time with it. I think he is a reasonable candidate for TAVR although I stressed the risk of structural valve deterioration and the fact that we do not have a good long-term data on durability with these valves. He understands and would like to proceed with TAVR if that is an option.  Following the decision to proceed with transcatheter aortic valve replacement, a discussion was held regarding what types of management strategies would be attempted intraoperatively in the event of life-threatening complications, including whether or not the patient would be considered a candidate for the use of cardiopulmonary bypass and/or conversion to open sternotomy for attempted surgical intervention.He would be a candidate for emergent sternotomy to manage any intraoperative complications. The patient is aware of the fact that transient use of cardiopulmonary bypass may be necessary. The patient has been advised of a variety of complications that might develop including but not limited to risks of death, stroke, paravalvular leak, aortic dissection or other major vascular complications, aortic annulus rupture, device embolization,  cardiac rupture or perforation, mitral regurgitation, acute myocardial infarction, arrhythmia, heart block or bradycardia requiring  permanent pacemaker placement, congestive heart failure, respiratory failure, renal failure, pneumonia, infection, other late complications related to structural valve deterioration or migration, or other complications that might ultimately cause a temporary or permanent loss of functional independence or other long term morbidity. The patient provides full informed consent for the procedure as described and all questions were answered.    DETAILS OF THE OPERATIVE PROCEDURE  PREPARATION:    The patient was brought to the operating room on the above mentioned date and appropriate monitoring was established by the anesthesia team. The patient was placed in the supine position on the operating table.  Intravenous antibiotics were administered. The patient was monitored closely throughout the procedure under conscious sedation.    Baseline transthoracic echocardiogram was performed. The patient's abdomen and both groins were prepped and draped in a sterile manner. A time out procedure was performed.   PERIPHERAL ACCESS:    Using the modified Seldinger technique, femoral arterial and venous access was obtained with placement of 6 Fr sheaths on the left side.  A pigtail diagnostic catheter was passed through the left arterial sheath under fluoroscopic guidance into the aortic root.  A temporary transvenous pacemaker catheter was passed through the left femoral venous sheath under fluoroscopic guidance into the right ventricle.  The pacemaker was tested to ensure stable lead placement and pacemaker capture. Aortic root angiography was performed in order to determine the optimal angiographic angle for valve deployment.   TRANSFEMORAL ACCESS:   Percutaneous transfemoral access and sheath placement was performed using ultrasound guidance.  The right common femoral artery was  cannulated using a micropuncture needle and appropriate location was verified using hand injection angiogram.  A pair of Abbott Perclose percutaneous closure devices were placed and a 6 French sheath replaced into the femoral artery.  The patient was heparinized systemically and ACT verified > 250 seconds.    A 16 Fr transfemoral E-sheath was introduced into the right common femoral artery after progressively dilating over an Amplatz superstiff wire. An AL-2 catheter was used to direct a straight-tip exchange length wire across the native aortic valve into the left ventricle. This was exchanged out for a pigtail catheter and position was confirmed in the LV apex. Simultaneous LV and Ao pressures were recorded.  The pigtail catheter was exchanged for an Amplatz Extra-stiff wire in the LV apex.    BALLOON AORTIC VALVULOPLASTY:   Not performed.   TRANSCATHETER HEART VALVE DEPLOYMENT:   An Edwards Sapien 3 Ultra transcatheter heart valve (size 29 mm) was prepared and crimped per manufacturer's guidelines, and the proper orientation of the valve is confirmed on the [employer] delivery system. The valve was advanced through the introducer sheath using normal technique until in an appropriate position in the abdominal aorta beyond the sheath tip. The balloon was then retracted and using the fine-tuning wheel was centered on the valve. The valve was then advanced across the aortic arch using appropriate flexion of the catheter. The valve was carefully positioned across the aortic valve annulus. The Commander catheter was retracted using normal technique. Once final position of the valve has been confirmed by angiographic assessment, the valve is deployed while temporarily holding ventilation and during rapid ventricular pacing to maintain systolic blood pressure < 50 mmHg and pulse pressure < 10 mmHg. The balloon inflation is held for >3 seconds after reaching full deployment volume. Once the balloon has  fully deflated the balloon is retracted into the ascending aorta and valve function is assessed using echocardiography. There  is felt to be no paravalvular leak and no central aortic insufficiency. Post-procedural gradients were acceptable.  The patient's hemodynamic recovery following valve deployment is good.  The deployment balloon and guidewire are both removed.    PROCEDURE COMPLETION:   The sheath was removed and femoral artery closure performed.  Protamine was administered once femoral arterial repair was complete. The temporary pacemaker, pigtail catheters and femoral sheaths were removed with manual pressure used for hemostasis.  A Mynx femoral closure device was utilized following removal of the diagnostic sheath in the left femoral artery.  The patient tolerated the procedure well and is transported to the cath lab recovery area in stable condition. There were no immediate intraoperative complications. All sponge instrument and needle counts are verified correct at completion of the operation.   No blood products were administered during the operation.  The patient received a total of 55 mL of intravenous contrast during the procedure.   Gaye Pollack, MD 10/29/2020

## 2020-10-29 NOTE — Anesthesia Procedure Notes (Signed)
Arterial Line Insertion Start/End5/17/2022 3:40 PM, 10/29/2020 3:40 PM Performed by: CRNA  Patient location: Pre-op. Preanesthetic checklist: patient identified, IV checked, site marked, risks and benefits discussed, surgical consent, monitors and equipment checked, pre-op evaluation, timeout performed and anesthesia consent Lidocaine 1% used for infiltration Left, radial was placed Catheter size: 20 G Hand hygiene performed  and Seldinger technique used Allen's test indicative of satisfactory collateral circulation Attempts: 2 Procedure performed without using ultrasound guided technique. Following insertion, dressing applied and Biopatch. Post procedure assessment: normal  Patient tolerated the procedure well with no immediate complications.

## 2020-10-29 NOTE — CV Procedure (Signed)
[facility]  TAVR OPERATIVE NOTE   Date of Procedure:  10/29/2020  Preoperative Diagnosis: Severe Aortic Stenosis   Postoperative Diagnosis: Same   Procedure:    Transcatheter Aortic Valve Replacement - Transfemoral Approach  Edwards Sapien 3 THV (size 29 mm, model #[id number] , serial # [id number])   Co-Surgeons:  Lauree Chandler, MD and Gaye Pollack, MD   Anesthesiologist:  Hodierne  Echocardiographer:  Gasper Sells  Pre-operative Echo Findings:  Severe aortic stenosis  Normal left ventricular systolic function  Post-operative Echo Findings:  No paravalvular leak  Normal left ventricular systolic function  BRIEF CLINICAL NOTE AND INDICATIONS FOR SURGERY  65 yo male with history of depression, GERD, HTN, hyperlipidemia, fatty liver disease, diabetes mellitus and bicuspid aortic valve with severe aortic stenosis who is here today for TAVR. He has been followed for moderate aortic stenosis. Echo 07/11/20 with LVEF=55-60%, severe LVH. The aortic valve appears to be bicuspid. There is severe calcification of the aortic valve leaflets. Mean gradient 41 mmHg, peak gradient 59 mmHg. AVA 1.24 cm2, dimensionless index 0.35. At his first visit here he described resting chest pain but no exertional dyspnea or chest pain. Cardiac cath 08/15/20 with no evidence of CAD. Gated cardiac CT 08/22/20 with bicuspid aortic valve with thickened and calcified leaflets with partial fusion of the right and left coronary cusps with severely reduced cusp separation c/w severe AS. Valve calcium score of 2886. AV area 559 mm2 suitable for a 29 mm Edwards Sapien 3 valve (Edwards rep has commented on the use of a 26 mm Sapien 3 valve).  CTA of the chest/abdomen and pelvis with anatomy suitable for transfemoral approach to TAVR. Incidental finding of a hypervascular lesion in the right lobe of the liver as well as left adrenal nodule.   During the course of the patient's preoperative work up  they have been evaluated comprehensively by a multidisciplinary team of specialists coordinated through the [facility] in the [facility].  They have been demonstrated to suffer from symptomatic severe aortic stenosis as noted above. The patient has been counseled extensively as to the relative risks and benefits of all options for the treatment of severe aortic stenosis including long term medical therapy, conventional surgery for aortic valve replacement, and transcatheter aortic valve replacement.  The patient has been independently evaluated by Dr. Cyndia Bent with CT surgery and they are felt to be at high risk for conventional surgical aortic valve replacement. The surgeon indicated the patient would be a poor candidate for conventional surgery. Based upon review of all of the patient's preoperative diagnostic tests they are felt to be candidate for transcatheter aortic valve replacement using the transfemoral approach as an alternative to high risk conventional surgery.    Following the decision to proceed with transcatheter aortic valve replacement, a discussion has been held regarding what types of management strategies would be attempted intraoperatively in the event of life-threatening complications, including whether or not the patient would be considered a candidate for the use of cardiopulmonary bypass and/or conversion to open sternotomy for attempted surgical intervention.  The patient has been advised of a variety of complications that might develop peculiar to this approach including but not limited to risks of death, stroke, paravalvular leak, aortic dissection or other major vascular complications, aortic annulus rupture, device embolization, cardiac rupture or perforation, acute myocardial infarction, arrhythmia, heart block or bradycardia requiring permanent pacemaker placement, congestive heart failure, respiratory failure, renal failure,  pneumonia, infection,  other late complications related to structural valve deterioration or migration, or other complications that might ultimately cause a temporary or permanent loss of functional independence or other long term morbidity.  The patient provides full informed consent for the procedure as described and all questions were answered preoperatively.    DETAILS OF THE OPERATIVE PROCEDURE  PREPARATION:   The patient is brought to the operating room on the above mentioned date and central monitoring was established by the anesthesia team including placement of a radial arterial line. The patient is placed in the supine position on the operating table.  Intravenous antibiotics are administered. Conscious sedation is used.   Baseline transthoracic echocardiogram was performed. The patient's chest, abdomen, both groins, and both lower extremities are prepared and draped in a sterile manner. A time out procedure is performed.   PERIPHERAL ACCESS:   Using the modified Seldinger technique, femoral arterial and venous access were obtained with placement of a 6 Fr sheath in the artery and a 7 Fr sheath in the vein on the left side using u/s guidance.  A pigtail diagnostic catheter was passed through the femoral arterial sheath under fluoroscopic guidance into the aortic root.  A temporary transvenous pacemaker catheter was passed through the femoral venous sheath under fluoroscopic guidance into the right ventricle.  The pacemaker was tested to ensure stable lead placement and pacemaker capture. Aortic root angiography was performed in order to determine the optimal angiographic angle for valve deployment.  TRANSFEMORAL ACCESS:  A micropuncture kit was used to gain access to the right femoral artery using u/s guidance. Position confirmed with angiography. Pre-closure with double ProGlide closure devices. The patient was heparinized systemically and ACT verified > 250 seconds.    A 16 Fr  transfemoral E-sheath was introduced into the right femoral artery after progressively dilating over an Amplatz superstiff wire. An AL-2 catheter was used to direct a straight-tip exchange length wire across the native aortic valve into the left ventricle. This was exchanged out for a pigtail catheter and position was confirmed in the LV apex. Simultaneous LV and Ao pressures were recorded.  The pigtail catheter was then exchanged for an Amplatz Extra-stiff wire in the LV apex.   TRANSCATHETER HEART VALVE DEPLOYMENT:  An Edwards Sapien 3 THV (size 29 mm) was prepared and crimped per manufacturer's guidelines, and the proper orientation of the valve is confirmed on the [employer] delivery system. The valve was advanced through the introducer sheath using normal technique until in an appropriate position in the abdominal aorta beyond the sheath tip. The balloon was then retracted and using the fine-tuning wheel was centered on the valve. The valve was then advanced across the aortic arch using appropriate flexion of the catheter. The valve was carefully positioned across the aortic valve annulus. The Commander catheter was retracted using normal technique. Once final position of the valve has been confirmed by angiographic assessment, the valve is deployed while temporarily holding ventilation and during rapid ventricular pacing to maintain systolic blood pressure < 50 mmHg and pulse pressure < 10 mmHg. The balloon inflation is held for >3 seconds after reaching full deployment volume. Once the balloon has fully deflated the balloon is retracted into the ascending aorta and valve function is assessed using TTE. There is felt to be no paravalvular leak and no central aortic insufficiency.  The patient's hemodynamic recovery following valve deployment is good.  The deployment balloon and guidewire are both removed. Echo demostrated acceptable post-procedural gradients, stable mitral valve function,  and no AI.    PROCEDURE COMPLETION:  The sheath was then removed and closure devices were completed. Protamine was administered once femoral arterial repair was complete. The temporary pacemaker, pigtail catheters and femoral sheaths were removed with a Mynx closure device placed in the artery and manual pressure used for venous hemostasis.    The patient tolerated the procedure well and is transported to the surgical intensive care in stable condition. There were no immediate intraoperative complications. All sponge instrument and needle counts are verified correct at completion of the operation.   No blood products were administered during the operation.  The patient received a total of 55 mL of intravenous contrast during the procedure.  Lauree Chandler MD 10/29/2020 4:56 PM

## 2020-10-29 NOTE — Interval H&P Note (Signed)
History and Physical Interval Note:  10/29/2020 1:53 PM  Evan [name]  has presented today for surgery, with the diagnosis of Severe Aortic Stenosis.  The various methods of treatment have been discussed with the patient and family. After consideration of risks, benefits and other options for treatment, the patient has consented to  Procedure(s): TRANSCATHETER AORTIC VALVE REPLACEMENT, TRANSFEMORAL (Bilateral) TRANSESOPHAGEAL ECHOCARDIOGRAM (TEE) (N/A) ULTRASOUND GUIDANCE FOR VASCULAR ACCESS (Bilateral) INTRAOPERATIVE TRANSTHORACIC ECHOCARDIOGRAM (Left) as a surgical intervention.  The patient's history has been reviewed, patient examined, no change in status, stable for surgery.  I have reviewed the patient's chart and labs.  Questions were answered to the patient's satisfaction.     Gaye Pollack

## 2020-10-29 NOTE — Progress Notes (Signed)
Patient arrived from cath lab to 4e07. Patient vital signs obtained and patient placed on monitor. CCMD made aware. Patient with B groins. Left with slight bruise but soft. Right level 0. Pt with palpable pulses in feet. Patient currently eating sand which. Call bell with in reach. Report given to oncoming shift RN . Will monitor. Jesenia Spera, Bettina Gavia RN

## 2020-10-30 ENCOUNTER — Inpatient Hospital Stay (HOSPITAL_COMMUNITY): Payer: Medicare Other

## 2020-10-30 ENCOUNTER — Encounter (HOSPITAL_COMMUNITY): Payer: Self-pay | Admitting: Cardiovascular Disease

## 2020-10-30 DIAGNOSIS — Z952 Presence of prosthetic heart valve: Secondary | ICD-10-CM

## 2020-10-30 DIAGNOSIS — Z8616 Personal history of COVID-19: Secondary | ICD-10-CM | POA: Diagnosis not present

## 2020-10-30 DIAGNOSIS — I5032 Chronic diastolic (congestive) heart failure: Secondary | ICD-10-CM | POA: Diagnosis not present

## 2020-10-30 DIAGNOSIS — I350 Nonrheumatic aortic (valve) stenosis: Principal | ICD-10-CM

## 2020-10-30 DIAGNOSIS — Z006 Encounter for examination for normal comparison and control in clinical research program: Secondary | ICD-10-CM | POA: Diagnosis not present

## 2020-10-30 LAB — BASIC METABOLIC PANEL
Anion gap: 6 (ref 5–15)
BUN: 11 mg/dL (ref 8–23)
CO2: 28 mmol/L (ref 22–32)
Calcium: 8.8 mg/dL — ABNORMAL LOW (ref 8.9–10.3)
Chloride: 105 mmol/L (ref 98–111)
Creatinine, Ser: 0.82 mg/dL (ref 0.61–1.24)
GFR, Estimated: >60 mL/min (ref >60)
Glucose, Bld: 69 mg/dL — ABNORMAL LOW (ref 70–99)
Potassium: 3.7 mmol/L (ref 3.5–5.1)
Sodium: 139 mmol/L (ref 135–145)

## 2020-10-30 LAB — CBC
HCT: 40 % (ref 39.0–52.0)
Hemoglobin: 13 g/dL (ref 13.0–17.0)
MCH: 31.4 pg (ref 26.0–34.0)
MCHC: 32.5 g/dL (ref 30.0–36.0)
MCV: 96.6 fL (ref 80.0–100.0)
Platelets: 221 10*3/uL (ref 150–400)
RBC: 4.14 MIL/uL — ABNORMAL LOW (ref 4.22–5.81)
RDW: 14.2 % (ref 11.5–15.5)
WBC: 9 10*3/uL (ref 4.0–10.5)
nRBC: 0 % (ref 0.0–0.2)

## 2020-10-30 LAB — MAGNESIUM: Magnesium: 1.9 mg/dL (ref 1.7–2.4)

## 2020-10-30 LAB — GLUCOSE, CAPILLARY: Glucose-Capillary: 107 mg/dL — ABNORMAL HIGH (ref 70–99)

## 2020-10-30 MED ORDER — LOSARTAN POTASSIUM 50 MG PO TABS
50.0000 mg | ORAL_TABLET | Freq: Every day | ORAL | Status: DC
  Administered 2020-10-30: 50 mg via ORAL
  Filled 2020-10-30: qty 1

## 2020-10-30 MED ORDER — CLOPIDOGREL BISULFATE 75 MG PO TABS: 75.0000 mg | ORAL_TABLET | Freq: Every day | ORAL | 1 refills | Status: DC

## 2020-10-30 MED ORDER — PANTOPRAZOLE SODIUM 40 MG PO TBEC: 40.0000 mg | DELAYED_RELEASE_TABLET | Freq: Every day | ORAL | 1 refills | Status: DC

## 2020-10-30 NOTE — Discharge Summary (Addendum)
[place] VALVE TEAM  Discharge Summary    Patient ID: Evan [name] MRN: [id number]; DOB: [birth year]  Admit date: 10/29/2020 Discharge date: 10/30/2020  Primary Care Provider: Ria Bush, MD  Primary Cardiologist: Elouise Munroe, MD / Dr. Angelena Form & Dr. Cyndia Bent (TAVR)  Discharge Diagnoses    Principal Problem:   S/P TAVR (transcatheter aortic valve replacement) Active Problems:   Controlled diabetes mellitus type 2 with complications [facility])   Essential hypertension   GERD (gastroesophageal reflux disease)   Obesity, Class I, BMI 30-34.9   OSA (obstructive sleep apnea)   Carotid stenosis   Severe aortic stenosis   Hyperlipidemia   NAFLD (nonalcoholic fatty liver disease)   Allergies Allergies  Allergen Reactions  . Penicillins     Respiratory distress as infant    Diagnostic Studies/Procedures    TAVR OPERATIVE NOTE   Date of Procedure:                10/29/2020  Preoperative Diagnosis:      Severe Aortic Stenosis   Postoperative Diagnosis:    Same   Procedure:        Transcatheter Aortic Valve Replacement - Percutaneous Right Transfemoral Approach             Edwards Sapien 3  THV (size 29 mm, model # 9600TFX, serial # [id number])              Co-Surgeons:                        Gaye Pollack, MD and  Lauree Chandler, MD   Anesthesiologist:                  Renaldo Reel, MD  Echocardiographer:              Osborne Oman, MD  Pre-operative Echo Findings: ? Severe aortic stenosis ? Normal left ventricular systolic function  Post-operative Echo Findings: ? No paravalvular leak ? Normal left ventricular systolic function  _____________    Echo 10/30/20: complete but pending formal read at the time of discharge   History of Present Illness     Evan [name] is a 65 y.o. male with a history of depression, GERD, HTN, HLD, fatty liver disease, DMT2 andbicuspid aortic valve  withsevere aortic stenosis who presented to [facility] on 10/29/20 for planned TAVR.   He has been followed for moderate aortic stenosis with a bicuspid aortic valve by Dr. Margaretann Loveless. Echo 07/11/20 showed LVEF=55-60%, severe LVH, bicuspid AoV with severe calcification of the aortic valve leaflets with a mean gradient of 41 mmHg, peak gradient 59 mmHg. AVA 1.24 cm2, dimensionless index 0.35. He was referred to Dr. Angelena Form with the structural heart team. At this visit, he described resting chest pain but no exertional dyspnea or chest pain. Cardiac cath 08/15/20 showed no evidence of CAD. Gated cardiac CT 08/22/20 with bicuspid aortic valve with thickened and calcified leaflets with partial fusion of the right and left coronary cusps with severely reduced cusp separation c/w severe AS. Valve calcium score of 2886. AV area 559 mm2 suitable for a 29 mm Edwards Sapien 3 valve (Edwards rep has commented on the use of a 26 mm Sapien 3 valve).  CTA of the chest/abdomen and pelvis with anatomy suitable for transfemoral approach to TAVR. Incidental finding of a hypervascular lesion in the right lobe of the liver as well as left adrenal nodule. Follow up MRI  favored focal nodular hyperplasia and confirmed the presence of an adrenal nodule. He was seen back in the office in April and reported chest pressure and palpitations.  The patient was not interested in pursuing open heart surgery. He was felt to be a reasonable candidate for TAVR which was scheduled for 10/29/20.  He was seen in the ER for chest pain and shortness of breath. He ruled out for MI and overall examination was normal. He was discharged home with plans to return for planned TAVR 10/29/20.  Hospital Course     Consultants: none    Severe AS: s/p successful TAVR with a 29 mm Edwards Sapien 3 THV via the TF approach on 10/29/20. Post operative echo competed but pending formal read at the time of discharge. Groin sites are stable. ECG with sinus and no high grade heart  block. Continue Asprin and started on Plavix $RemoveB'75mg'utXdsTaB$  daily x 6 months.   GERD: Prilosec was changed to Protonix given potential drug drug interaction with Plavix   DMT2: treated with SSI while admitted. Resume home meds at discharge. Okay to resume Metformin after 48 hours after contrast dye exposure (5/19PM)   Incidental findings: pre TAVR CT showed the following which will be addressed in the outpatient setting.   Liver lesion: pre TAVR CT showed a 2.3 x 1.5 cm hypervascular lesion in the right lobe of the liver, not evident on prior examination from 2018. This may represent a benign perfusion anomaly or flash fill cavernous hemangioma, however, the possibility of an aggressive hypervascular lesion is not excluded. Abdominal MRI was ordered which favored focal nodular hyperplasia. Radiologist wrote "If the patient has abnormal liver enzymes or if otherwise clinically warranted, these could be surveilled with follow hepatic protocol MRI in 1 years time. If such follow up is elected, I would recommend using Eovist contrast medium with the MRI, which can provide even greater diagnostic specificity for Evan [name]." Of note, recent LFTs were normal.   Adrenal nodule: 1.5 cm left adrenal nodule similar in retrospect to remote prior study from 2018, favored to represent a benign lesions such as a lipid poor adenoma. MRI confirmed adrenal nodule.  _____________  Discharge Vitals Blood pressure (!) 145/84, pulse 76, temperature 98.1 F (36.7 C), temperature source Oral, resp. rate 20, height $RemoveBe'5\' 11"'mhQaFfeaa$  (1.803 m), weight 98 kg, SpO2 98 %.  Filed Weights   10/29/20 1200 10/30/20 0615  Weight: 97.5 kg 98 kg    GEN: Well nourished, well developed, in no acute distress HEENT: normal Neck: no JVD or masses Cardiac: RRR; soft flow murmur. No rubs, or gallops,no edema  Respiratory:  clear to auscultation bilaterally, normal work of breathing GI: soft, nontender, nondistended, + BS MS: no deformity or atrophy Skin:  warm and dry, no rash.  Groin sites clear without hematoma or ecchymosis  Neuro:  Alert and Oriented x 3, Strength and sensation are intact Psych: euthymic mood, full affect   Labs & Radiologic Studies    CBC Recent Labs    10/28/20 1630 10/29/20 1531 10/29/20 1728 10/30/20 0213  WBC 6.9  --   --  9.0  HGB 14.7   < > 14.3 13.0  HCT 44.2   < > 42.0 40.0  MCV 94.4  --   --  96.6  PLT 272  --   --  221   < > = values in this interval not displayed.   Basic Metabolic Panel Recent Labs    10/28/20 1630 10/29/20 1531 10/29/20 1728 10/30/20  0213  NA 135   < > 141 139  K 3.8   < > 4.1 3.7  CL 102   < > 104 105  CO2 23  --   --  28  GLUCOSE 111*   < > 136* 69*  BUN 15   < > 14 11  CREATININE 0.77   < > 0.70 0.82  CALCIUM 9.4  --   --  8.8*  MG  --   --   --  1.9   < > = values in this interval not displayed.   Liver Function Tests Recent Labs    10/28/20 1630  AST 28  ALT 29  ALKPHOS 51  BILITOT 0.6  PROT 7.0  ALBUMIN 3.7   No results for input(s): LIPASE, AMYLASE in the last 72 hours. Cardiac Enzymes No results for input(s): CKTOTAL, CKMB, CKMBINDEX, TROPONINI in the last 72 hours. BNP Invalid input(s): POCBNP D-Dimer No results for input(s): DDIMER in the last 72 hours. Hemoglobin A1C Recent Labs    10/28/20 1630  HGBA1C 6.1*   Fasting Lipid Panel No results for input(s): CHOL, HDL, LDLCALC, TRIG, CHOLHDL, LDLDIRECT in the last 72 hours. Thyroid Function Tests No results for input(s): TSH, T4TOTAL, T3FREE, THYROIDAB in the last 72 hours.  Invalid input(s): FREET3 _____________  DG Chest 2 View  Result Date: 10/25/2020 CLINICAL DATA:  Chest pain for 1 month worsened in past several days, planned AVR EXAM: CHEST - 2 VIEW COMPARISON:  07/26/2007 FINDINGS: Normal heart size, mediastinal contours, and pulmonary vascularity. Lungs clear. No infiltrate, pleural effusion, or pneumothorax. Osseous structures unremarkable. IMPRESSION: No acute abnormalities.  Electronically Signed   By: Lavonia Dana M.D.   On: 10/25/2020 11:04   ECHOCARDIOGRAM LIMITED  Result Date: 10/29/2020    ECHOCARDIOGRAM LIMITED REPORT   Patient Name:   Evan [name] Date of Exam: 10/29/2020 Medical Rec #:  [id number]       Height:       71.0 in Accession #:    [id number]      Weight:       215.0 lb Date of Birth:  10/22/1955        BSA:          2.174 m Patient Age:    29 years        BP:           134/85 mmHg Patient Gender: M               HR:           66 bpm. Exam Location:  Inpatient Procedure: Limited Echo, Cardiac Doppler and Color Doppler Indications:     I35.0 Nonrheumatic aortic (valve) stenosis  History:         Patient has prior history of Echocardiogram examinations, most                  recent 07/11/2020. Aortic Valve Disease, Signs/Symptoms:Chest                  Pain; Risk Factors:Hypertension, Dyslipidemia and Diabetes.                  Severe aortic stenosis.  Sonographer:     Roseanna Rainbow RDCS Referring Phys:  [place] Diagnosing Phys: Rudean Haskell MD  Sonographer Comments: TAVR procedure. IMPRESSIONS  1. The aortic valve is calcified and appeared functional bicuspid. Aortic valve regurgitation is not visualized. Moderate to severe aortic valve stenosis. Procedural  echocardiogram performed: Placement of a 29 mm Edwards SAPIEN valve without residual paravalvular leak. Post procedural mean aortic gradient of 4 mm Hg.  2. Left ventricular ejection fraction, by estimation, is 55 to 60%. The left ventricle has normal function. The left ventricle has no regional wall motion abnormalities. There is severe concentric left ventricular hypertrophy.  3. Right ventricular systolic function is normal. The right ventricular size is normal.  4. The mitral valve is grossly normal. No evidence of mitral valve regurgitation. FINDINGS  Left Ventricle: Left ventricular ejection fraction, by estimation, is 55 to 60%. The left ventricle has normal function. The left  ventricle has no regional wall motion abnormalities. The left ventricular internal cavity size was normal in size. There is  severe concentric left ventricular hypertrophy. Right Ventricle: The right ventricular size is normal. No increase in right ventricular wall thickness. Right ventricular systolic function is normal. Mitral Valve: The mitral valve is grossly normal. Tricuspid Valve: The tricuspid valve is grossly normal. Tricuspid valve regurgitation is not demonstrated. Aortic Valve: The aortic valve is calcified. Aortic valve regurgitation is not visualized. Moderate to severe aortic stenosis is present. Aortic valve mean gradient measures 23.3 mmHg. Aortic valve peak gradient measures 32.0 mmHg. Aortic valve area, by VTI measures 2.34 cm. LEFT VENTRICLE PLAX 2D LVIDd:         4.30 cm LVIDs:         3.00 cm LV PW:         1.70 cm LV IVS:        1.30 cm LVOT diam:     2.70 cm LV SV:         150 LV SV Index:   69 LVOT Area:     5.73 cm  LEFT ATRIUM         Index LA diam:    3.20 cm 1.47 cm/m  AORTIC VALVE AV Area (Vmax):    2.28 cm AV Area (Vmean):   2.05 cm AV Area (VTI):     2.34 cm AV Vmax:           283.00 cm/s AV Vmean:          215.400 cm/s AV VTI:            0.642 m AV Peak Grad:      32.0 mmHg AV Mean Grad:      23.3 mmHg LVOT Vmax:         112.50 cm/s LVOT Vmean:        77.250 cm/s LVOT VTI:          0.262 m LVOT/AV VTI ratio: 0.41  AORTA Ao Root diam: 4.20 cm Ao Asc diam:  3.50 cm  SHUNTS Systemic VTI:  0.26 m Systemic Diam: 2.70 cm Rudean Haskell MD Electronically signed by Rudean Haskell MD Signature Date/Time: 10/29/2020/5:28:37 PM    Final    Structural Heart Procedure  Result Date: 10/29/2020 See surgical note for result.  Disposition   Pt is being discharged home today in good condition.  Follow-up Plans & Appointments     Follow-up Information    Eileen Stanford, PA-C. Go on 11/06/2020.   Specialties: Cardiology, Radiology Why: @ 1:30pm, please arrive at  least 10 minutes early.  Contact information: [address] [place] [address] [phone]                Discharge Medications   Allergies as of 10/30/2020      Reactions  Penicillins    Respiratory distress as infant      Medication List    STOP taking these medications   ibuprofen 200 MG tablet Commonly known as: ADVIL   metoprolol tartrate 50 MG tablet Commonly known as: LOPRESSOR   omeprazole 20 MG capsule Commonly known as: PRILOSEC Replaced by: pantoprazole 40 MG tablet     TAKE these medications   aspirin EC 81 MG tablet Take 81 mg by mouth every evening. Swallow whole.   cephALEXin 500 MG capsule Commonly known as: Keflex TAKE FOUR TABLETS BY MOUTH 60 MINUTES PRIOR TO ANY DENTAL APPOINTMENTS What changed:   how much to take  how to take this  when to take this  additional instructions   clopidogrel 75 MG tablet Commonly known as: PLAVIX Take 1 tablet (75 mg total) by mouth daily with breakfast. Start taking on: Oct 31, 2020   Farxiga 10 MG Tabs tablet Generic drug: dapagliflozin propanediol TAKE 1 TABLET (10 MG TOTAL) BY MOUTH EVERY MORNING. TAKE 1 TABLET BY MOUTH EVERY MORNING. What changed: See the new instructions.   fluticasone 50 MCG/ACT nasal spray Commonly known as: FLONASE Place 1 spray into both nostrils daily as needed for allergies or rhinitis.   K2 PLUS D3 PO Take 1 tablet by mouth every evening.   losartan 100 MG tablet Commonly known as: COZAAR Take 50 mg by mouth daily.   Magnesium 250 MG Tabs Take 250 mg by mouth every evening.   metFORMIN 1000 MG tablet Commonly known as: GLUCOPHAGE TAKE 1 TABLET (1,000 MG TOTAL) BY MOUTH 2 TIMES DAILY WITH A MEAL. What changed: See the new instructions. Notes to patient: Okay to resume Metformin after 48 hours after contrast dye exposure (5/19PM)    OneTouch Verio Flex System w/Device Kit 1 kit by Does not apply route daily. Use to check blood sugars 2  times daily   OneTouch Verio test strip Generic drug: glucose blood USE TO CHECK BLOOD SUGARS 2 TIMES DAILY   Ozempic (1 MG/DOSE) 4 MG/3ML Sopn Generic drug: Semaglutide (1 MG/DOSE) Inject 1 mg as directed once a week. What changed:   how much to take  when to take this   pantoprazole 40 MG tablet Commonly known as: PROTONIX Take 1 tablet (40 mg total) by mouth daily. Start taking on: Oct 31, 2020 Replaces: omeprazole 20 MG capsule   rosuvastatin 5 MG tablet Commonly known as: CRESTOR Take 1 tablet (5 mg total) by mouth daily.   sildenafil 20 MG tablet Commonly known as: REVATIO Take 2-5 tablets (40-100 mg total) by mouth daily as needed (relations).   TURMERIC PO Take 1,800 mg by mouth every evening.   Vitamin B-12 2500 MCG Subl Take 2,500 mcg by mouth daily.   Vitamin D3 50 MCG (2000 UT) Tabs Take 2,000 Units by mouth daily.   Zinc 50 MG Tabs Take 50 mg by mouth every evening.         Outstanding Labs/Studies  none  Duration of Discharge Encounter   Greater than 30 minutes including physician time.  Signed, Angelena Form, PA-C 10/30/2020, 9:55 AM [phone]  I have personally seen and examined this patient. I agree with the assessment and plan as outlined above.  BP stable. Sinus rhythm. Groins stable.  Will d/c home today  Lauree Chandler 10/30/2020 7:18 PM

## 2020-10-30 NOTE — Discharge Instructions (Signed)
Prilosec was changed to Protonix given potential drug drug interaction with Plavix. When you come off of Plavix after 6 months, you can resume your Prilosec.   ACTIVITY AND EXERCISE . Daily activity and exercise are an important part of your recovery. People recover at different rates depending on their general health and type of valve procedure. . Most people recovering from TAVR feel better relatively quickly  . No lifting, pushing, pulling more than 10 pounds (examples to avoid: groceries, vacuuming, gardening, golfing):             - For one week with a procedure through the groin.             - For six weeks for procedures through the chest wall or neck. NOTE: You will typically see one of our providers 7-14 days after your procedure to discuss [place] the above activities.      DRIVING . Do not drive until you are seen for follow up and cleared by a provider. Generally, we ask patient to not drive for 1 week after their procedure. . If you have been told by your doctor in the past that you may not drive, you must talk with him/her before you begin driving again.   DRESSING . Groin site: you may leave the clear dressing over the site for up to one week or until it falls off.   HYGIENE . If you had a femoral (leg) procedure, you may take a shower when you return home. After the shower, pat the site dry. Do NOT use powder, oils or lotions in your groin area until the site has completely healed. . If you had a chest procedure, you may shower when you return home unless specifically instructed not to by your discharging practitioner.             - DO NOT scrub incision; pat dry with a towel.             - DO NOT apply any lotions, oils, powders to the incision.             - No tub baths / swimming for at least 2 weeks. . If you notice any fevers, chills, increased pain, swelling, bleeding or pus, please contact your doctor.   ADDITIONAL INFORMATION . If you are going to have an  upcoming dental procedure, please contact our office as you will require antibiotics ahead of time to prevent infection on your heart valve.    If you have any questions or concerns you can call the structural heart phone during normal business hours 8am-4pm. If you have an urgent need after hours or weekends please call [phone] to talk to the on call provider for general cardiology. If you have an emergency that requires immediate attention, please call 911.    After TAVR Checklist  Check  Test Description   Follow up appointment in 1-2 weeks  You will see our structural heart physician assistant, Evan [name]. Your incision sites will be checked and you will be cleared to drive and resume all normal activities if you are doing well.     1 month echo and follow up  You will have an echo to check on your new heart valve and be seen back in the office by Evan [name]. Many times the echo is not read by your appointment time, but Evan [name] will call you later that day or the following day to report your results.   Follow  up with your primary cardiologist You will need to be seen by your primary cardiologist in the following 3-6 months after your 1 month appointment in the valve clinic. Often times your Plavix or Aspirin will be discontinued during this time, but this is decided on a case by case basis.    1 year echo and follow up You will have another echo to check on your heart valve after 1 year and be seen back in the office by Evan [name]. This your last structural heart visit.   Bacterial endocarditis prophylaxis  You will have to take antibiotics for the rest of your life before all dental procedures (even teeth cleanings) to protect your heart valve. Antibiotics are also required before some surgeries. Please check with your cardiologist before scheduling any surgeries. Also, please make sure to tell us if you have a penicillin allergy as you will require an alternative antibiotic.

## 2020-10-30 NOTE — Progress Notes (Signed)
Patient given discharge instructions and both patient and his wife stated understanding.

## 2020-10-30 NOTE — Plan of Care (Signed)
  Problem: Education: Goal: Knowledge of General Education information will improve Description: Including pain rating scale, medication(s)/side effects and non-pharmacologic comfort measures 10/30/2020 1041 by Lurline Idol, RN Outcome: Adequate for Discharge 10/30/2020 1041 by Lurline Idol, RN Outcome: Adequate for Discharge   Problem: Health Behavior/Discharge Planning: Goal: Ability to manage health-related needs will improve 10/30/2020 1041 by Lurline Idol, RN Outcome: Adequate for Discharge 10/30/2020 1041 by Lurline Idol, RN Outcome: Adequate for Discharge   Problem: Clinical Measurements: Goal: Ability to maintain clinical measurements within normal limits will improve 10/30/2020 1041 by Lurline Idol, RN Outcome: Adequate for Discharge 10/30/2020 1041 by Lurline Idol, RN Outcome: Adequate for Discharge Goal: Will remain free from infection 10/30/2020 1041 by Lurline Idol, RN Outcome: Adequate for Discharge 10/30/2020 1041 by Lurline Idol, RN Outcome: Adequate for Discharge Goal: Diagnostic test results will improve 10/30/2020 1041 by Lurline Idol, RN Outcome: Adequate for Discharge 10/30/2020 1041 by Lurline Idol, RN Outcome: Adequate for Discharge Goal: Respiratory complications will improve 10/30/2020 1041 by Lurline Idol, RN Outcome: Adequate for Discharge 10/30/2020 1041 by Lurline Idol, RN Outcome: Adequate for Discharge Goal: Cardiovascular complication will be avoided 10/30/2020 1041 by Lurline Idol, RN Outcome: Adequate for Discharge 10/30/2020 1041 by Lurline Idol, RN Outcome: Adequate for Discharge   Problem: Activity: Goal: Risk for activity intolerance will decrease 10/30/2020 1041 by Lurline Idol, RN Outcome: Adequate for Discharge 10/30/2020 1041 by Lurline Idol, RN Outcome: Adequate for Discharge   Problem: Nutrition: Goal: Adequate nutrition will be maintained 10/30/2020 1041 by Lurline Idol, RN Outcome: Adequate for  Discharge 10/30/2020 1041 by Lurline Idol, RN Outcome: Adequate for Discharge   Problem: Coping: Goal: Level of anxiety will decrease 10/30/2020 1041 by Lurline Idol, RN Outcome: Adequate for Discharge 10/30/2020 1041 by Lurline Idol, RN Outcome: Adequate for Discharge   Problem: Elimination: Goal: Will not experience complications related to bowel motility 10/30/2020 1041 by Lurline Idol, RN Outcome: Adequate for Discharge 10/30/2020 1041 by Lurline Idol, RN Outcome: Adequate for Discharge Goal: Will not experience complications related to urinary retention 10/30/2020 1041 by Lurline Idol, RN Outcome: Adequate for Discharge 10/30/2020 1041 by Lurline Idol, RN Outcome: Adequate for Discharge   Problem: Pain Managment: Goal: General experience of comfort will improve 10/30/2020 1041 by Lurline Idol, RN Outcome: Adequate for Discharge 10/30/2020 1041 by Lurline Idol, RN Outcome: Adequate for Discharge   Problem: Safety: Goal: Ability to remain free from injury will improve 10/30/2020 1041 by Lurline Idol, RN Outcome: Adequate for Discharge 10/30/2020 1041 by Lurline Idol, RN Outcome: Adequate for Discharge   Problem: Skin Integrity: Goal: Risk for impaired skin integrity will decrease 10/30/2020 1041 by Lurline Idol, RN Outcome: Adequate for Discharge 10/30/2020 1041 by Lurline Idol, RN Outcome: Adequate for Discharge

## 2020-10-30 NOTE — Anesthesia Postprocedure Evaluation (Signed)
Anesthesia Post Note  Patient: LAYTEN AIKEN  Procedure(s) Performed: TRANSCATHETER AORTIC VALVE REPLACEMENT, TRANSFEMORAL (Bilateral Groin) ULTRASOUND GUIDANCE FOR VASCULAR ACCESS (Bilateral Groin) INTRAOPERATIVE TRANSTHORACIC ECHOCARDIOGRAM (Left Chest)     Patient location during evaluation: Cath Lab Anesthesia Type: MAC Level of consciousness: awake and alert Pain management: pain level controlled Vital Signs Assessment: post-procedure vital signs reviewed and stable Respiratory status: spontaneous breathing, nonlabored ventilation, respiratory function stable and patient connected to nasal cannula oxygen Cardiovascular status: stable and blood pressure returned to baseline Postop Assessment: no apparent nausea or vomiting Anesthetic complications: no   No complications documented.  Last Vitals:  Vitals:   10/30/20 0410 10/30/20 0822  BP: 135/74 (!) 145/84  Pulse: 75 76  Resp: 17 20  Temp: 36.7 C 36.7 C  SpO2: 98% 98%    Last Pain:  Vitals:   10/30/20 0822  TempSrc: Oral  PainSc: Nadine

## 2020-10-30 NOTE — Progress Notes (Signed)
CARDIAC REHAB PHASE I   PRE:  Rate/Rhythm: 87 SR  BP:  Supine:   Sitting: 143/89  Standing:    SaO2: 96%RA  MODE:  Ambulation: 550 ft   POST:  Rate/Rhythm: 89 SR  BP:  Supine:   Sitting: 148/74  Standing:    SaO2: 98%RA 0929-1000 Pt walked 550 ft on RA with steady gait and tolerated well. Gave heart healthy and diabetic diets and encouraged low sodium also. Gave walking instructions for ex. Offered CRP 2 but pt declined referral .  Will exercise on his own.  Pt voiced understanding of ed.   Graylon Good, RN BSN  10/30/2020 10:31 AM

## 2020-10-30 NOTE — Progress Notes (Signed)
  Echocardiogram 2D Echocardiogram has been performed.  Evan [name] M 10/30/2020, 9:49 AM

## 2020-10-31 ENCOUNTER — Telehealth: Payer: Self-pay | Admitting: Physician Assistant

## 2020-10-31 MED FILL — Magnesium Sulfate Inj 50%: INTRAMUSCULAR | Qty: 10 | Status: AC

## 2020-10-31 MED FILL — Potassium Chloride Inj 2 mEq/ML: INTRAVENOUS | Qty: 40 | Status: AC

## 2020-10-31 MED FILL — Heparin Sodium (Porcine) Inj 1000 Unit/ML: INTRAMUSCULAR | Qty: 30 | Status: AC

## 2020-10-31 NOTE — Telephone Encounter (Addendum)
  [facility] VALVE TEAM   Patient contacted regarding discharge from [facility] on 5/18  Patient understands to follow up with provider Nell Range on 5/25 at [facility].  Patient understands discharge instructions? yes Patient understands medications and regimen? yes Patient understands to bring all medications to this visit? yes  Angelena Form PA-C  [facility]

## 2020-11-01 ENCOUNTER — Other Ambulatory Visit: Payer: Medicare Other

## 2020-11-01 ENCOUNTER — Inpatient Hospital Stay (HOSPITAL_COMMUNITY): Admission: RE | Admit: 2020-11-01 | Payer: Medicare Other | Source: Ambulatory Visit

## 2020-11-04 ENCOUNTER — Ambulatory Visit: Payer: Medicare Other | Admitting: Endocrinology

## 2020-11-06 ENCOUNTER — Ambulatory Visit (INDEPENDENT_AMBULATORY_CARE_PROVIDER_SITE_OTHER): Payer: Medicare Other | Admitting: Physician Assistant

## 2020-11-06 ENCOUNTER — Other Ambulatory Visit: Payer: Self-pay

## 2020-11-06 ENCOUNTER — Encounter: Payer: Self-pay | Admitting: Physician Assistant

## 2020-11-06 VITALS — BP 130/80 | HR 60 | Ht 71.0 in | Wt 215.0 lb

## 2020-11-06 DIAGNOSIS — K769 Liver disease, unspecified: Secondary | ICD-10-CM | POA: Diagnosis not present

## 2020-11-06 DIAGNOSIS — E278 Other specified disorders of adrenal gland: Secondary | ICD-10-CM | POA: Diagnosis not present

## 2020-11-06 DIAGNOSIS — I10 Essential (primary) hypertension: Secondary | ICD-10-CM | POA: Diagnosis not present

## 2020-11-06 DIAGNOSIS — Z952 Presence of prosthetic heart valve: Secondary | ICD-10-CM | POA: Diagnosis not present

## 2020-11-06 LAB — ECHOCARDIOGRAM LIMITED
AR max vel: 5.12 cm2
AV Area VTI: 5.17 cm2
AV Area mean vel: 4.7 cm2
AV Mean grad: 6 mmHg
AV Peak grad: 9.1 mmHg
Ao pk vel: 1.51 m/s
Height: 71 in
Weight: 3456.81 oz

## 2020-11-06 MED ORDER — CEPHALEXIN 500 MG PO CAPS: ORAL_CAPSULE | ORAL | 12 refills | Status: DC

## 2020-11-06 NOTE — Progress Notes (Signed)
[facility] [place]                                     Cardiology Office Note:    Date:  11/07/2020   ID:  Evan [name], DOB June 30, 1955, MRN [id number]  PCP:  Ria Bush, MD  [facility] Cardiologist:  Elouise Munroe, MD / Dr. Angelena Form & Dr. Cyndia Bent (TAVR) [facility] Electrophysiologist:  None   Referring MD: Ria Bush, MD   [facility] s/p TAVR  History of Present Illness:    Evan [name] is a 65 y.o. male with a hx of depression, GERD, HTN, HLD, fatty liver disease, DMT2 andbicuspid aortic valve withsevere aortic stenosis s/p TAVR (10/29/20) who presents to clinic for follow up.   He has been followed for moderate aortic stenosis with a bicuspid aortic valve by Dr. Margaretann Loveless. Echo 07/11/20 showed LVEF=55-60%, severe LVH, bicuspid AoV with severe calcification of the aortic valve leaflets with a mean gradient of 41 mmHg, peak gradient 59 mmHg. AVA 1.24 cm2, dimensionless index 0.35.He was referred to Dr. Angelena Form with the structural heart team. At this visit, he described resting chest pain but no exertional dyspnea or chest pain. Cardiac cath 08/15/20 showed no evidence of CAD. Gated cardiac CT 08/22/20 with bicuspid aortic valve with thickened and calcified leaflets with partial fusion of the right and left coronary cusps with severely reduced cusp separation c/w severe AS. Valve calcium score of 2886. AV area 559 mm2 suitable for a 29 mm Edwards Sapien 3 valve(Edwards rep has commented on the use of a 26 mm Sapien 3 valve).CTA of the chest/abdomen and pelvis with anatomy suitable for transfemoral approach to TAVR. Incidental finding of a hypervascular lesion inthe right lobe of the liver as well as left adrenal nodule.Follow up MRI favored focal nodular hyperplasia and confirmed the presence of an adrenal nodule. He was seen back in the office in April and reported chest pressure and palpitations. The patient was not  interested in pursuing open heart surgery. He was felt to be a reasonable candidate for TAVR which was scheduled for5/17/22. He was seen in the ER for chest pain and shortness of breath. He ruled out for MI and overall examination was normal. He was discharged home with plans to return for planned TAVR 10/29/20.  He was evaluated by the multidisciplinary valve team and underwent successful TAVR with a 29 mm Edwards Sapien 3 THV via the TF approach on 10/29/20. Post operative echo showed EF 55%, normally functioning TAVR with a mean gradient of 6.0 mmHg and trivial PVL. He was discharged home on aspirin and started on Plavix 75 mg daily. Prilosec was changed to Protonix given potential drug drug interaction with Plavix.  Today he presents to clinic for follow up. Has had some mild CP, but significantly improevd since TAVR. No SOB. No LE edema, orthopnea or PND. No dizziness or syncope. No blood in stool or urine. No palpitations.    Past Medical History:  Diagnosis Date  . Adrenal adenoma   . Complication of anesthesia    difficulty to weak up  . COVID-19 virus infection 05/2020  . Depression    intolerant of cymbalta and wellbutrin  . Diverticulosis of colon   . Esophagitis 03/2015   by EGD  . Gastritis 03/2015   by EGD - mild, chronic  . Hyperlipidemia   . Hypertension   .  Left ventricular hypertrophy   . Lichen simplex chronicus 05/2015   R scrotum [name])  . Liver lesion    2022 CTs: There was an incidental finding of a hypervascular lesion in the right lobe of the liver as well as left adrenal nodule. He underwent MRI of the abdomen showing the appearance of the liver favored focal nodular hyperplasia, as well as small left adrenal adenoma.  Marland Kitchen NAFLD (nonalcoholic fatty liver disease) 01/2015   by [place]  . 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 Angelena Form & Dr. Cyndia Bent   . Severe aortic stenosis   . T2DM (type 2 diabetes mellitus) ([place])    DSME 09/2013    Past Surgical History:  Procedure Laterality Date  . COLONOSCOPY  2006   diverticulosis per pt  . COLONOSCOPY  03/2015   2 polyps, rpt 5 yrs [name])  . ESOPHAGOGASTRODUODENOSCOPY  03/2015   mild chronic gastritis, esophagitis   . INTRAOPERATIVE TRANSTHORACIC ECHOCARDIOGRAM Left 10/29/2020   Procedure: INTRAOPERATIVE TRANSTHORACIC ECHOCARDIOGRAM;  Surgeon: Burnell Blanks, MD;  Location: [facility];  Service: Open Heart Surgery;  Laterality: Left;  . LUMBAR LAMINECTOMY  1989   L5-S1  . RIGHT/LEFT HEART CATH AND CORONARY ANGIOGRAPHY N/A 08/15/2020   Procedure: RIGHT/LEFT HEART CATH AND CORONARY ANGIOGRAPHY;  Surgeon: Burnell Blanks, MD;  Location: [facility] CV [facility];  Service: Cardiovascular;  Laterality: N/A;  . TRANSCATHETER AORTIC VALVE REPLACEMENT, TRANSFEMORAL Bilateral 10/29/2020   Procedure: TRANSCATHETER AORTIC VALVE REPLACEMENT, TRANSFEMORAL;  Surgeon: Burnell Blanks, MD;  Location: [facility];  Service: Open Heart Surgery;  Laterality: Bilateral;  . ULTRASOUND GUIDANCE FOR VASCULAR ACCESS Bilateral 10/29/2020   Procedure: ULTRASOUND GUIDANCE FOR VASCULAR ACCESS;  Surgeon: Burnell Blanks, MD;  Location: [facility];  Service: Open Heart Surgery;  Laterality: Bilateral;    Current Medications: Current Meds  Medication Sig  . aspirin EC 81 MG tablet Take 81 mg by mouth every evening. Swallow whole.  . Blood Glucose Monitoring Suppl (ONETOUCH VERIO FLEX SYSTEM) w/Device KIT 1 kit by Does not apply route daily. Use to check blood sugars 2 times daily  . Cholecalciferol (VITAMIN D3) 50 MCG (2000 UT) TABS Take 2,000 Units by mouth daily.  . clopidogrel (PLAVIX) 75 MG tablet Take 1 tablet (75 mg total) by mouth daily with breakfast.  . Cyanocobalamin (VITAMIN B-12) 2500 MCG SUBL Take 2,500 mcg by mouth daily.  . dapagliflozin propanediol (FARXIGA) 10 MG TABS  tablet Take 5 mg by mouth daily.  . fluticasone (FLONASE) 50 MCG/ACT nasal spray Place 1 spray into both nostrils daily as needed for allergies or rhinitis.  Marland Kitchen losartan (COZAAR) 100 MG tablet Take 50 mg by mouth daily.  . Magnesium 250 MG TABS Take 250 mg by mouth every evening.  . metFORMIN (GLUCOPHAGE) 1000 MG tablet TAKE 1 TABLET (1,000 MG TOTAL) BY MOUTH 2 TIMES DAILY WITH A MEAL. (Patient taking differently: Take 1,000 mg by mouth every evening.)  . ONETOUCH VERIO test strip USE TO CHECK BLOOD SUGARS 2 TIMES DAILY  . pantoprazole (PROTONIX) 40 MG tablet Take 1 tablet (40 mg total) by mouth daily.  . rosuvastatin (CRESTOR) 5 MG tablet Take 1 tablet (5 mg total) by mouth daily.  . Semaglutide, 1 MG/DOSE, (OZEMPIC, 1 MG/DOSE,) 4 MG/3ML SOPN Inject 1 mg as directed once a week. (Patient taking differently: Inject 0.5  mg as directed every Monday.)  . sildenafil (REVATIO) 20 MG tablet Take 2-5 tablets (40-100 mg total) by mouth daily as needed (relations).  . TURMERIC PO Take 1,800 mg by mouth every evening.  . Vitamin D-Vitamin K (K2 PLUS D3 PO) Take 1 tablet by mouth every evening.  . Zinc 50 MG TABS Take 50 mg by mouth every evening.  . [DISCONTINUED] cephALEXin (KEFLEX) 500 MG capsule TAKE FOUR TABLETS BY MOUTH 60 MINUTES PRIOR TO ANY DENTAL APPOINTMENTS  . [DISCONTINUED] FARXIGA 10 MG TABS tablet TAKE 1 TABLET (10 MG TOTAL) BY MOUTH EVERY MORNING. TAKE 1 TABLET BY MOUTH EVERY MORNING. (Patient taking differently: Take 5 mg by mouth daily.)     Allergies:   Penicillins   Social History   Socioeconomic History  . Marital status: Married    Spouse name: Not on file  . Number of children: 2  . Years of education: Not on file  . Highest education level: Not on file  Occupational History  . Occupation: Retired-Worked for the [employer]  . Smoking status: Never Smoker  . Smokeless tobacco: Never Used  Substance and Sexual Activity  . Alcohol use: Not Currently     Alcohol/week: 0.0 standard drinks    Comment: occasional  . Drug use: No  . Sexual activity: Not on file  Other Topics Concern  . 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  Keeps record of caloric intake   Social Determinants of Health   Financial Resource Strain: Not on file  Food Insecurity: Not on file  Transportation Needs: Not on file  Physical Activity: Not on file  Stress: Not on file  Social Connections: Not on file     Family History: The patient's family history includes Alcohol abuse in his brother and mother; Alzheimer's disease in his father; CAD in his brother; Coronary artery disease in his maternal uncle; Diabetes in his brother and maternal grandfather; Emphysema in his mother; Hyperlipidemia in his father; Hypertension in his father; Prostate cancer in his brother; Schizophrenia in his paternal uncle; Stroke in his father, maternal grandfather, maternal grandmother, paternal grandfather, and paternal grandmother.  ROS:   Please see the history of present illness.    All other systems reviewed and are negative.  EKGs/Labs/Other Studies Reviewed:    The following studies were reviewed today:  TAVR OPERATIVE NOTE   Date of Procedure:10/29/2020  Preoperative Diagnosis:Severe Aortic Stenosis   Postoperative Diagnosis:Same   Procedure:   Transcatheter Aortic Valve Replacement - PercutaneousRightTransfemoral Approach Edwards Sapien 3 THV (size 252m model # 9600TFX, serial #[id number] Co-Surgeons:Bryan K.Alveria ApleyMD and ChLauree ChandlerMD   Anesthesiologist:A. Hodierne,  MD  Echocardiographer:M. ChGasper SellsMD  Pre-operative Echo Findings: ? Severe aortic stenosis ? Normalleft ventricular systolic function  Post-operative Echo Findings: ? Noparavalvular leak ? Normalleft ventricular systolic function  _____________    Echo 10/30/20:  IMPRESSIONS  1. The aortic valve has been repaired/replaced. Aortic valve  regurgitation is not visualized. There is a 29 mm Sapien prosthetic (TAVR)  valve present in the aortic position. Procedure Date: 10/29/2020. Aortic  valve area, by VTI measures 5.17 cm. Aortic  valve mean gradient measures 6.0 mmHg. Aortic valve Vmax measures 1.51  m/s.  Aortic valve acceleration time measures 84 msec. Trivial paravalular  leak at the 12 o'Clock Position; DVI 78.  2. Left ventricular ejection fraction, by estimation, is 55 to 60%. The  left ventricle has normal function.  3. Right ventricular systolic function is normal. The right ventricular  size is normal.  4. The mitral valve is grossly normal. No evidence of mitral valve  regurgitation.   Comparison(s): A prior study was performed on 10/29/20. Stable placement of  TAVR valve.   _______________________   Echo 10/30/2020 IMPRESSIONS  1. The aortic valve has been repaired/replaced. Aortic valve  regurgitation is not visualized. There is a 29 mm Sapien prosthetic (TAVR)  valve present in the aortic position. Procedure Date: 10/29/2020. Aortic  valve area, by VTI measures 5.17 cm. Aortic  valve mean gradient measures 6.0 mmHg. Aortic valve Vmax measures 1.51  m/s. Aortic valve acceleration time measures 84 msec. Trivial paravalular  leak at the 12 o'Clock Position; DVI 78.  2. Left ventricular ejection fraction, by estimation, is 55 to 60%. The  left ventricle has normal function.  3. Right ventricular systolic function is normal. The right ventricular  size is normal.  4. The mitral valve is grossly normal. No evidence of mitral  valve  regurgitation.   Comparison(s): A prior study was performed on 10/29/20. Stable placement of  TAVR valve.   EKG:  EKG is  ordered today.  The ekg ordered today demonstrates sinus with new 1st deg AV block (PR 206) and new lBBB (QRS 156bpm), PVC, HR 60  Recent Labs: 10/28/2020: ALT 29 10/30/2020: BUN 11; Creatinine, Ser 0.82; Hemoglobin 13.0; Magnesium 1.9; Platelets 221; Potassium 3.7; Sodium 139  Recent Lipid Panel    Component Value Date/Time   CHOL 165 03/11/2020 0907   TRIG 59.0 03/11/2020 0907   HDL 82.50 03/11/2020 0907   CHOLHDL 2 03/11/2020 0907   VLDL 11.8 03/11/2020 0907   LDLCALC 71 03/11/2020 0907   LDLDIRECT 99.0 05/20/2015 0955     Risk Assessment/Calculations:       Physical Exam:    VS:  BP 130/80   Pulse 60   Ht 5' 11" (1.803 m)   Wt 215 lb (97.5 kg)   SpO2 98%   BMI 29.99 kg/m     Wt Readings from Last 3 Encounters:  11/06/20 215 lb (97.5 kg)  10/30/20 216 lb 0.8 oz (98 kg)  10/25/20 215 lb (97.5 kg)     GEN:  Well nourished, well developed in no acute distress HEENT: Normal NECK: No JVD; No carotid bruits LYMPHATICS: No lymphadenopathy CARDIAC: RRR, soft flow murmur @ RSUB. No rubs, gallops RESPIRATORY:  Clear to auscultation without rales, wheezing or rhonchi  ABDOMEN: Soft, non-tender, non-distended MUSCULOSKELETAL:  No edema; No deformity  SKIN: Warm and dry.  Groin sites clear without hematoma or ecchymosis  NEUROLOGIC:  Alert and oriented x 3 PSYCHIATRIC:  Normal affect   ASSESSMENT:    1. S/P TAVR (transcatheter aortic valve replacement)   2. Essential hypertension   3. Liver lesion   4. Adrenal nodule (HCC)    PLAN:    In order of problems listed above:  Severe AS s/p TAVR:doing well since TAVR. ECG with new 1st deg AV block and LBBB but no HAVB or s/s of symptomatic bradycardia. Chest pain has improved greatly. Continue on aspirin and plavix. He has Keflex for SBE prophylaxis. I will see him back for 1 month follow up  and echo.   HTN: BP well controlled. No  changes made.   Liver lesion: pre TAVR CT showed a 2.3 x 1.5 cm hypervascular lesion in the right lobe of the liver, not evident on prior examination from 2018. This may represent a benign perfusion anomaly or flash fill cavernous hemangioma, however, the possibility of an aggressive hypervascular lesion is not excluded. Abdominal MRI was ordered which favored focal nodular hyperplasia. Radiologist wrote "If the patient has abnormal liver enzymes or if otherwise clinically warranted, these could be surveilled with follow hepatic protocol MRI in 1 years time. If such follow up is elected, I would recommend using Eovist contrast medium with the MRI, which can provide even greater diagnostic specificity for FNH." Of note, recent LFTs were normal. Will defer need for further imaging to PCP.  Adrenal nodule: 1.5 cm left adrenal nodule similar in retrospect to remote prior study from 2018, favored to represent a benign lesions such as a lipid poor adenoma. MRI confirmed adrenal nodule. No further work up neccessary    Medication Adjustments/Labs and Tests Ordered: Current medicines are reviewed at length with the patient today.  Concerns regarding medicines are outlined above.  Orders Placed This Encounter  Procedures  . EKG 12-Lead   Meds ordered this encounter  Medications  . cephALEXin (KEFLEX) 500 MG capsule    Sig: Take 4 capsules (2,000 mg) one hour prior to all dental visits.    Dispense:  12 capsule    Refill:  12    Patient Instructions  Medication Instructions:  Your provider recommends that you continue on your current medications as directed. Please refer to the Current Medication list given to you today.   *If you need a refill on your cardiac medications before your next appointment, please call your pharmacy*  Follow-Up: Please keep your appointments as scheduled!    Signed, Angelena Form, PA-C  11/07/2020 9:00 AM    [facility]

## 2020-11-06 NOTE — Patient Instructions (Signed)
Medication Instructions:  Your provider recommends that you continue on your current medications as directed. Please refer to the Current Medication list given to you today.   *If you need a refill on your cardiac medications before your next appointment, please call your pharmacy*  Follow-Up: Please keep your appointments as scheduled!

## 2020-11-07 ENCOUNTER — Encounter: Payer: Self-pay | Admitting: Family Medicine

## 2020-11-07 DIAGNOSIS — D3502 Benign neoplasm of left adrenal gland: Secondary | ICD-10-CM | POA: Insufficient documentation

## 2020-11-07 DIAGNOSIS — K769 Liver disease, unspecified: Secondary | ICD-10-CM | POA: Insufficient documentation

## 2020-11-12 ENCOUNTER — Other Ambulatory Visit: Payer: Medicare Other

## 2020-11-12 ENCOUNTER — Other Ambulatory Visit (INDEPENDENT_AMBULATORY_CARE_PROVIDER_SITE_OTHER): Payer: Medicare Other

## 2020-11-12 ENCOUNTER — Other Ambulatory Visit: Payer: Self-pay

## 2020-11-12 DIAGNOSIS — E1169 Type 2 diabetes mellitus with other specified complication: Secondary | ICD-10-CM

## 2020-11-12 DIAGNOSIS — E669 Obesity, unspecified: Secondary | ICD-10-CM

## 2020-11-12 LAB — BASIC METABOLIC PANEL
BUN: 14 mg/dL (ref 6–23)
CO2: 27 mEq/L (ref 19–32)
Calcium: 9.7 mg/dL (ref 8.4–10.5)
Chloride: 100 mEq/L (ref 96–112)
Creatinine, Ser: 0.85 mg/dL (ref 0.40–1.50)
GFR: 89.98 mL/min (ref >60.00)
Glucose, Bld: 111 mg/dL — ABNORMAL HIGH (ref 70–99)
Potassium: 4 mEq/L (ref 3.5–5.1)
Sodium: 138 mEq/L (ref 135–145)

## 2020-11-12 LAB — HEMOGLOBIN A1C: Hgb A1c MFr Bld: 5.9 % (ref 4.6–6.5)

## 2020-11-14 ENCOUNTER — Ambulatory Visit (INDEPENDENT_AMBULATORY_CARE_PROVIDER_SITE_OTHER): Payer: Medicare Other | Admitting: Endocrinology

## 2020-11-14 ENCOUNTER — Other Ambulatory Visit: Payer: Self-pay

## 2020-11-14 ENCOUNTER — Ambulatory Visit: Payer: Medicare Other | Admitting: Endocrinology

## 2020-11-14 ENCOUNTER — Encounter: Payer: Self-pay | Admitting: Endocrinology

## 2020-11-14 VITALS — BP 144/78 | HR 91 | Ht 71.0 in | Wt 220.6 lb

## 2020-11-14 DIAGNOSIS — E1169 Type 2 diabetes mellitus with other specified complication: Secondary | ICD-10-CM

## 2020-11-14 DIAGNOSIS — E669 Obesity, unspecified: Secondary | ICD-10-CM

## 2020-11-14 NOTE — Progress Notes (Signed)
Patient ID: Evan [name], male   DOB: 1956-05-22, 65 y.o.   MRN: [id number]          Reason for Appointment:Type 2 Diabetes and associated problems   History of Present Illness:          Date of diagnosis of type 2 diabetes mellitus: 2012        Background history:   Diagnosed incidentally with a high blood sugar Has been on metformin since diagnosis and subsequently Amaryl had been added He has had sporadic follow-up for the last few years His A1c had been below 7 to about 08/2015 Subsequently in 9/17 his A1c had gone up to 8%  Recent history:      Non-insulin hypoglycemic drugs : Farxiga 10 mg,  metformin 1 g twice a day, Ozempic 0.5 mg weekly   His A1c is again in the normal range at 5.9   Current management, blood sugar patterns and problems identified:  He was told to stop his Amaryl on his last visit  Also even though he was supposed to stop metformin in the morning he misunderstood and is still taking this twice a day  Ozempic was reduced to half the dose  Although he has been less active the last 31-monthbecause of cardiac issues he has maintained his weight loss  Checking blood sugars mostly in the mornings and these are still mildly increased  However without Amaryl he has not had blood sugars as low as 61 which he was getting previously  He also thinks his blood sugars are somewhat higher when he first wakes up; lab glucose was 111 fasting  He is still very consistent with keeping a low calorie and low carbohydrate intake in his diet  Meals: Usually skipping breakfast, dinner is usually a protein with vegetables or salad, low-fat intake       Side effects from medications have been: None  Glucose monitoring:  done 1 or less times a day         Glucometer: One Touch Verio  .      Blood Glucose readings by review of monitor, download   PRE-MEAL Fasting Lunch Dinner Bedtime Overall  Glucose range:  122  95-123  126    Mean/median:   115   120    POST-MEAL PC Breakfast PC Lunch PC Dinner  Glucose range:    118, 141  Mean/median:      Previously:  PRE-MEAL Fasting Lunch Dinner Bedtime Overall  Glucose range:  61-135    90-111   Mean/median:  115    96 102   POST-MEAL PC Breakfast PC Lunch PC Dinner  Glucose range:   ?  Mean/median:        Self-care: The diet that the patient has been following is: tries to reduce high-fat and fried foods, Carbohydrates and avoiding drinks with sugar .       Dietician visit, most recent: 02/2017  Weight history: Highest previous weight 287 in 2019  Wt Readings from Last 3 Encounters:  11/14/20 220 lb 9.6 oz (100.1 kg)  11/06/20 215 lb (97.5 kg)  10/30/20 216 lb 0.8 oz (98 kg)    Glycemic control:   Lab Results  Component Value Date   HGBA1C 5.9 11/12/2020   HGBA1C 6.1 (H) 10/28/2020   HGBA1C 5.4 07/11/2020   Lab Results  Component Value Date   MICROALBUR 0.7 07/11/2020   LDLCALC 71 03/11/2020   CREATININE 0.85 11/12/2020   Lab Results  Component Value  Date   MICRALBCREAT 0.9 07/11/2020    Lab Results  Component Value Date   FRUCTOSAMINE 224 12/19/2019   FRUCTOSAMINE 239 12/13/2017   FRUCTOSAMINE 268 03/12/2017      Allergies as of 11/14/2020      Reactions   Penicillins    Respiratory distress as infant      Medication List       Accurate as of November 14, 2020  4:11 PM. If you have any questions, ask your nurse or doctor.        aspirin EC 81 MG tablet Take 81 mg by mouth every evening. Swallow whole.   cephALEXin 500 MG capsule Commonly known as: Keflex Take 4 capsules (2,000 mg) one hour prior to all dental visits.   clopidogrel 75 MG tablet Commonly known as: PLAVIX Take 1 tablet (75 mg total) by mouth daily with breakfast.   dapagliflozin propanediol 10 MG Tabs tablet Commonly known as: FARXIGA Take 5 mg by mouth daily.   fluticasone 50 MCG/ACT nasal spray Commonly known as: FLONASE Place 1 spray into both nostrils daily as needed for  allergies or rhinitis.   K2 PLUS D3 PO Take 1 tablet by mouth every evening.   losartan 100 MG tablet Commonly known as: COZAAR Take 50 mg by mouth daily.   Magnesium 250 MG Tabs Take 250 mg by mouth every evening.   metFORMIN 1000 MG tablet Commonly known as: GLUCOPHAGE TAKE 1 TABLET (1,000 MG TOTAL) BY MOUTH 2 TIMES DAILY WITH A MEAL. What changed: See the new instructions.   OneTouch Verio Flex System w/Device Kit 1 kit by Does not apply route daily. Use to check blood sugars 2 times daily   OneTouch Verio test strip Generic drug: glucose blood USE TO CHECK BLOOD SUGARS 2 TIMES DAILY   Ozempic (1 MG/DOSE) 4 MG/3ML Sopn Generic drug: Semaglutide (1 MG/DOSE) Inject 1 mg as directed once a week. What changed:   how much to take  when to take this   pantoprazole 40 MG tablet Commonly known as: PROTONIX Take 1 tablet (40 mg total) by mouth daily.   rosuvastatin 5 MG tablet Commonly known as: CRESTOR Take 1 tablet (5 mg total) by mouth daily.   sildenafil 20 MG tablet Commonly known as: REVATIO Take 2-5 tablets (40-100 mg total) by mouth daily as needed (relations).   TURMERIC PO Take 1,800 mg by mouth every evening.   Vitamin B-12 2500 MCG Subl Take 2,500 mcg by mouth daily.   Vitamin D3 50 MCG (2000 UT) Tabs Take 2,000 Units by mouth daily.   Zinc 50 MG Tabs Take 50 mg by mouth every evening.       Allergies:  Allergies  Allergen Reactions  . Penicillins     Respiratory distress as infant    Past Medical History:  Diagnosis Date  . Adrenal adenoma   . Complication of anesthesia    difficulty to weak up  . COVID-19 virus infection 05/2020  . Depression    intolerant of cymbalta and wellbutrin  . Diverticulosis of colon   . Esophagitis 03/2015   by EGD  . Gastritis 03/2015   by EGD - mild, chronic  . Hyperlipidemia   . Hypertension   . Left ventricular hypertrophy   . Lichen simplex chronicus 05/2015   R scrotum [name])  . Liver  lesion    2022 CTs: There was an incidental finding of a hypervascular lesion in the right lobe of the liver as well as left  adrenal nodule. He underwent MRI of the abdomen showing the appearance of the liver favored focal nodular hyperplasia, as well as small left adrenal adenoma.  Marland Kitchen NAFLD (nonalcoholic fatty liver disease) 01/2015   by [place]  . 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 Angelena Form & Dr. Cyndia Bent   . Severe aortic stenosis   . T2DM (type 2 diabetes mellitus) ([place])    DSME 09/2013    Past Surgical History:  Procedure Laterality Date  . COLONOSCOPY  2006   diverticulosis per pt  . COLONOSCOPY  03/2015   2 polyps, rpt 5 yrs [name])  . ESOPHAGOGASTRODUODENOSCOPY  03/2015   mild chronic gastritis, esophagitis   . INTRAOPERATIVE TRANSTHORACIC ECHOCARDIOGRAM Left 10/29/2020   Procedure: INTRAOPERATIVE TRANSTHORACIC ECHOCARDIOGRAM;  Surgeon: Burnell Blanks, MD;  Location: [facility];  Service: Open Heart Surgery;  Laterality: Left;  . LUMBAR LAMINECTOMY  1989   L5-S1  . RIGHT/LEFT HEART CATH AND CORONARY ANGIOGRAPHY N/A 08/15/2020   Procedure: RIGHT/LEFT HEART CATH AND CORONARY ANGIOGRAPHY;  Surgeon: Burnell Blanks, MD;  Location: [facility];  Service: Cardiovascular;  Laterality: N/A;  . TRANSCATHETER AORTIC VALVE REPLACEMENT, TRANSFEMORAL Bilateral 10/29/2020   Procedure: TRANSCATHETER AORTIC VALVE REPLACEMENT, TRANSFEMORAL;  Surgeon: Burnell Blanks, MD;  Location: [facility];  Service: Open Heart Surgery;  Laterality: Bilateral;  . ULTRASOUND GUIDANCE FOR VASCULAR ACCESS Bilateral 10/29/2020   Procedure: ULTRASOUND GUIDANCE FOR VASCULAR ACCESS;  Surgeon: Burnell Blanks, MD;  Location: [facility];  Service: Open Heart Surgery;  Laterality: Bilateral;    Family History  Problem Relation Age of Onset  . Alzheimer's  disease Father   . Hypertension Father   . Stroke Father   . Hyperlipidemia Father   . Prostate cancer Brother   . Diabetes Brother   . Alcohol abuse Brother   . CAD Brother   . Coronary artery disease Maternal Uncle   . Schizophrenia Paternal Uncle   . Emphysema Mother   . Alcohol abuse Mother   . Stroke Maternal Grandmother   . Stroke Maternal Grandfather   . Diabetes Maternal Grandfather   . Stroke Paternal Grandmother   . Stroke Paternal Grandfather     Social History:  reports that he has never smoked. He has never used smokeless tobacco. He reports previous alcohol use. He reports that he does not use drugs.   Review of Systems  Lipid history: On Crestor 5 mg from his PCP and his LDL is consistently around 70     Lab Results  Component Value Date   CHOL 165 03/11/2020   HDL 82.50 03/11/2020   LDLCALC 71 03/11/2020   LDLDIRECT 99.0 05/20/2015   TRIG 59.0 03/11/2020   CHOLHDL 2 03/11/2020           Hypertension: On Losartan 100 mg, 1/2 tablet daily with good control  Also monitoring regularly at home  BP Readings from Last 3 Encounters:  11/14/20 (!) 144/78  11/06/20 130/80  10/30/20 (!) 145/84   Dizziness and chest pain better with aortic valve surgery  Most recent foot exam: 3/21    LABS:  Lab on 11/12/2020  Component Date Value Ref Range Status  . Sodium 11/12/2020 138  135 - 145 mEq/L Final  . Potassium 11/12/2020 4.0  3.5 - 5.1 mEq/L Final  . Chloride 11/12/2020 100  96 - 112  mEq/L Final  . CO2 11/12/2020 27  19 - 32 mEq/L Final  . Glucose, Bld 11/12/2020 111* 70 - 99 mg/dL Final  . BUN 11/12/2020 14  6 - 23 mg/dL Final  . Creatinine, Ser 11/12/2020 0.85  0.40 - 1.50 mg/dL Final  . GFR 11/12/2020 89.98  >60.00 mL/min Final   Calculated using the CKD-EPI Creatinine Equation (2021)  . Calcium 11/12/2020 9.7  8.4 - 10.5 mg/dL Final  . Hgb A1c MFr Bld 11/12/2020 5.9  4.6 - 6.5 % Final   Glycemic Control Guidelines for People with Diabetes:Non  Diabetic:  <6%Goal of Therapy: <7%Additional Action Suggested:  >8%     Physical Examination:  BP (!) 144/78 (BP Location: Left Arm, Patient Position: Sitting, Cuff Size: Normal)   Pulse 91   Ht _0  (1.803 m)   Wt 220 lb 9.6 oz (100.1 kg)   SpO2 97%   BMI 30.77 kg/m   Diabetic Foot Exam - Simple   Simple Foot Form Diabetic Foot exam was performed with the following findings: Yes   Visual Inspection No deformities, no ulcerations, no other skin breakdown bilaterally: Yes Sensation Testing Intact to touch and monofilament testing bilaterally: Yes Pulse Check Posterior Tibialis and Dorsalis pulse intact bilaterally: Yes Comments      ASSESSMENT:  Diabetes type 2, non-insulin-dependent  See history of present illness for detailed discussion of current diabetes management, blood sugar patterns and problems identified     A1c is still excellent at 5.9  Currently on 3 drug regimen including Farxiga, metformin and Ozempic 0.5 mg weekly, Amaryl previously stopped  Although his fasting readings are 115 average his blood sugars are usually better the last of the day although not checked enough He is still watching his diet which has helped his level of control  HYPERTENSION: Well-controlled with losartan 50 mg  Renal function quite stable along with potassium  For exam showed no abnormalities    PLAN:    Stop metformin in the morning and continue evening dose Continue 0.5 mg Ozempic weekly  Follow-up in 4 months   There are no Patient Instructions on file for this visit.  Elayne Snare 11/14/2020, 4:11 PM   Note: This office note was prepared with Dragon voice recognition system technology. Any transcriptional errors that result from this process are unintentional.

## 2020-11-19 ENCOUNTER — Ambulatory Visit (INDEPENDENT_AMBULATORY_CARE_PROVIDER_SITE_OTHER): Payer: Medicare Other | Admitting: Family Medicine

## 2020-11-19 ENCOUNTER — Other Ambulatory Visit: Payer: Self-pay

## 2020-11-19 ENCOUNTER — Encounter: Payer: Self-pay | Admitting: Family Medicine

## 2020-11-19 VITALS — BP 146/90 | HR 62 | Temp 97.8°F | Ht 71.0 in | Wt 220.2 lb

## 2020-11-19 DIAGNOSIS — B369 Superficial mycosis, unspecified: Secondary | ICD-10-CM | POA: Insufficient documentation

## 2020-11-19 DIAGNOSIS — H6240 Otitis externa in other diseases classified elsewhere, unspecified ear: Secondary | ICD-10-CM | POA: Insufficient documentation

## 2020-11-19 DIAGNOSIS — I10 Essential (primary) hypertension: Secondary | ICD-10-CM | POA: Diagnosis not present

## 2020-11-19 DIAGNOSIS — H60502 Unspecified acute noninfective otitis externa, left ear: Secondary | ICD-10-CM

## 2020-11-19 DIAGNOSIS — Z952 Presence of prosthetic heart valve: Secondary | ICD-10-CM

## 2020-11-19 DIAGNOSIS — E118 Type 2 diabetes mellitus with unspecified complications: Secondary | ICD-10-CM

## 2020-11-19 DIAGNOSIS — H6092 Unspecified otitis externa, left ear: Secondary | ICD-10-CM | POA: Insufficient documentation

## 2020-11-19 NOTE — Patient Instructions (Addendum)
You do have external ear infection with wax buildup. Part of wax removed today.  Continue ear drops.  Let us know if worsening pain or fever or just not improving with treatment for referral to ENT.  Continue monitoring blood pressures at home.

## 2020-11-19 NOTE — Progress Notes (Signed)
Patient ID: Evan [name], male    DOB: [birth year], 65 y.o.   MRN: [id number]  This visit was conducted in person.  BP (!) 146/90 (BP Location: Right Arm, Cuff Size: Large)   Pulse 62   Temp 97.8 F (36.6 C) (Temporal)   Ht 5' 11" (1.803 m)   Wt 220 lb 3 oz (99.9 kg)   SpO2 97%   BMI 30.71 kg/m    CC: L earache  Subjective:   HPI: Evan [name] is a 65 y.o. male presenting on 11/19/2020 for Ear Pain (C/o left ear pain and stopped up.  Started about 1 wk ago.  Seen on 11/17/20 at [facility].  Had ear irrigated and prescribed Cipro, still taking.  Pain has improved.  Suggested pt f/u with PCP. )   1 wk h/o L earache with muffled hearing. Saw [facility] in [place] on Sunday s/p ear irrigation, ciprodex ear drops. Here for f/u. Pain has improved, muffed hearing persists. Having trouble equalizing pressures. No fevers/chills, nasal congestion, ear drainage, ST. No recent swimming.   BP elevation noted today - has been running elevated since heart surgery (TAVR). Upcoming appt next week with cards PA.   Lab Results  Component Value Date   HGBA1C 5.9 11/12/2020        Relevant past medical, surgical, family and social history reviewed and updated as indicated. Interim medical history since our last visit reviewed. Allergies and medications reviewed and updated. Outpatient Medications Prior to Visit  Medication Sig Dispense Refill  . aspirin EC 81 MG tablet Take 81 mg by mouth every evening. Swallow whole.    . Blood Glucose Monitoring Suppl (ONETOUCH VERIO FLEX SYSTEM) w/Device KIT 1 kit by Does not apply route daily. Use to check blood sugars 2 times daily 1 kit 2  . cephALEXin (KEFLEX) 500 MG capsule Take 4 capsules (2,000 mg) one hour prior to all dental visits. 12 capsule 12  . Cholecalciferol (VITAMIN D3) 50 MCG (2000 UT) TABS Take 2,000 Units by mouth daily.    . clopidogrel (PLAVIX) 75 MG tablet Take 1 tablet (75 mg total) by mouth daily with breakfast. 90 tablet 1  .  Cyanocobalamin (VITAMIN B-12) 2500 MCG SUBL Take 2,500 mcg by mouth daily.    . dapagliflozin propanediol (FARXIGA) 10 MG TABS tablet Take 5 mg by mouth daily.    . fluticasone (FLONASE) 50 MCG/ACT nasal spray Place 1 spray into both nostrils daily as needed for allergies or rhinitis.    Marland Kitchen losartan (COZAAR) 100 MG tablet Take 50 mg by mouth daily.    . Magnesium 250 MG TABS Take 250 mg by mouth every evening.    . metFORMIN (GLUCOPHAGE) 1000 MG tablet TAKE 1 TABLET (1,000 MG TOTAL) BY MOUTH 2 TIMES DAILY WITH A MEAL. (Patient taking differently: Take 1,000 mg by mouth every evening.) 180 tablet 1  . ONETOUCH VERIO test strip USE TO CHECK BLOOD SUGARS 2 TIMES DAILY 200 strip 3  . pantoprazole (PROTONIX) 40 MG tablet Take 1 tablet (40 mg total) by mouth daily. 90 tablet 1  . rosuvastatin (CRESTOR) 5 MG tablet Take 1 tablet (5 mg total) by mouth daily. 90 tablet 3  . Semaglutide, 1 MG/DOSE, (OZEMPIC, 1 MG/DOSE,) 4 MG/3ML SOPN Inject 1 mg as directed once a week. (Patient taking differently: Inject 0.5 mg as directed every Monday.) 9 mL 1  . sildenafil (REVATIO) 20 MG tablet Take 2-5 tablets (40-100 mg total) by mouth daily as needed (relations).  30 tablet 3  . TURMERIC PO Take 1,800 mg by mouth every evening.    . Vitamin D-Vitamin K (K2 PLUS D3 PO) Take 1 tablet by mouth every evening.    . Zinc 50 MG TABS Take 50 mg by mouth every evening.    . ciprofloxacin-dexamethasone (CIPRODEX) OTIC suspension SMARTSIG:4 Drop(s) Left Ear Twice Daily     No facility-administered medications prior to visit.     Per HPI unless specifically indicated in ROS section below Review of Systems Objective:  BP (!) 146/90 (BP Location: Right Arm, Cuff Size: Large)   Pulse 62   Temp 97.8 F (36.6 C) (Temporal)   Ht 5' 11" (1.803 m)   Wt 220 lb 3 oz (99.9 kg)   SpO2 97%   BMI 30.71 kg/m   Wt Readings from Last 3 Encounters:  11/19/20 220 lb 3 oz (99.9 kg)  11/14/20 220 lb 9.6 oz (100.1 kg)  11/06/20 215 lb  (97.5 kg)      Physical Exam Vitals and nursing note reviewed.  Constitutional:      Appearance: Normal appearance. He is not ill-appearing.  HENT:     Head: Normocephalic and atraumatic.     Right Ear: Tympanic membrane, ear canal and external ear normal. There is no impacted cerumen.     Left Ear: Decreased hearing noted. Drainage and swelling present. There is impacted cerumen.     Ears:     Comments:  Dark grey discharge/cerumen present to left ear canal Good portion of wax removed, anterior external canal cleared however cerumen/discharge remains deep to canal, unable to be fully removed and unable to visualize TM. Canal with mild edema without erythema Musculoskeletal:     Cervical back: Normal range of motion. No rigidity.  Lymphadenopathy:     Cervical: No cervical adenopathy.  Neurological:     Mental Status: He is alert.  Psychiatric:        Mood and Affect: Mood normal.        Behavior: Behavior normal.       Results for orders placed or performed in visit on 29/52/84  Basic metabolic panel  Result Value Ref Range   Sodium 138 135 - 145 mEq/L   Potassium 4.0 3.5 - 5.1 mEq/L   Chloride 100 96 - 112 mEq/L   CO2 27 19 - 32 mEq/L   Glucose, Bld 111 (H) 70 - 99 mg/dL   BUN 14 6 - 23 mg/dL   Creatinine, Ser 0.85 0.40 - 1.50 mg/dL   GFR 89.98 >60.00 mL/min   Calcium 9.7 8.4 - 10.5 mg/dL  Hemoglobin A1c  Result Value Ref Range   Hgb A1c MFr Bld 5.9 4.6 - 6.5 %   Assessment & Plan:  This visit occurred during the SARS-CoV-2 public health emergency.  Safety protocols were in place, including screening questions prior to the visit, additional usage of staff PPE, and extensive cleaning of exam room while observing appropriate contact time as indicated for disinfecting solutions.   Problem List Items Addressed This Visit    Controlled diabetes mellitus type 2 with complications ([place])    Chronic, great controlled followed by endo.       Essential hypertension     Chronic. Mild elevation noted since TAVR. Has cards f/u scheduled next week. Already avoids salt/sodium in diet. No changes made today.       S/P TAVR (transcatheter aortic valve replacement)   External otitis of left ear - Primary    With wax  buildup that was unable to be fully cleared. Recommend ENT referral for wax removal and further treatment. However given symptoms are improving, he would like to give ciprodex more time to work. Red flags to update [place] reviewed. Agrees to ENT eval if not improving as expected.           No orders of the defined types were placed in this encounter.  No orders of the defined types were placed in this encounter.   Patient Instructions  You do have external ear infection with wax buildup. Part of wax removed today.  Continue ear drops.  Let us know if worsening pain or fever or just not improving with treatment for referral to ENT.  Continue monitoring blood pressures at home.    Follow up plan: Return if symptoms worsen or fail to improve.  Ria Bush, MD

## 2020-11-19 NOTE — Assessment & Plan Note (Signed)
Chronic, great controlled followed by endo.

## 2020-11-19 NOTE — Assessment & Plan Note (Signed)
With wax buildup that was unable to be fully cleared. Recommend ENT referral for wax removal and further treatment. However given symptoms are improving, he would like to give ciprodex more time to work. Red flags to update [place] reviewed. Agrees to ENT eval if not improving as expected.

## 2020-11-19 NOTE — Assessment & Plan Note (Addendum)
Chronic. Mild elevation noted since TAVR. Has cards f/u scheduled next week. Already avoids salt/sodium in diet. No changes made today.

## 2020-11-21 ENCOUNTER — Encounter: Payer: Self-pay | Admitting: Family Medicine

## 2020-11-21 ENCOUNTER — Other Ambulatory Visit: Payer: Self-pay

## 2020-11-21 DIAGNOSIS — H60502 Unspecified acute noninfective otitis externa, left ear: Secondary | ICD-10-CM

## 2020-11-21 DIAGNOSIS — H6122 Impacted cerumen, left ear: Secondary | ICD-10-CM

## 2020-11-22 ENCOUNTER — Encounter: Payer: Self-pay | Admitting: Family Medicine

## 2020-11-23 IMAGING — US US ABDOMEN COMPLETE
1 series · 14 of 25 positions shown · non-contrast
Comparison: 12/18/2016 CT abdomen pelvis.

CLINICAL DATA: Abdominal pain.

EXAM:
ABDOMEN ULTRASOUND COMPLETE

[Series 1: us abdomen complete · 0.20mm/px · 14 of 80 slices shown]
[im 1/80]
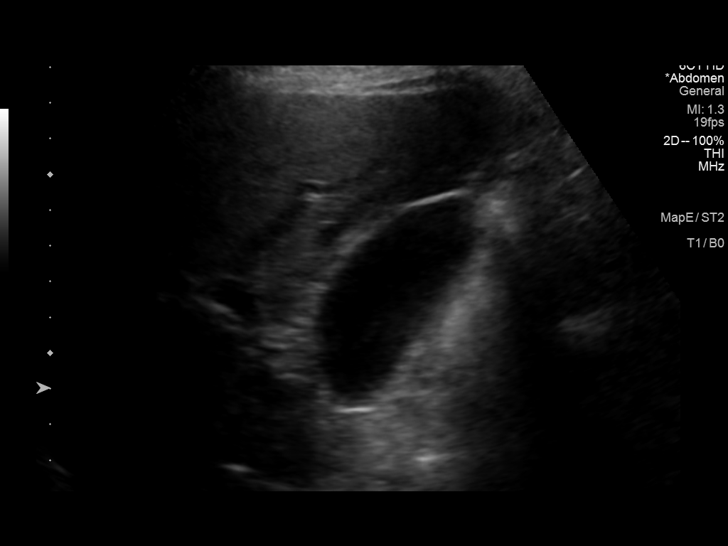
[im 7/80]
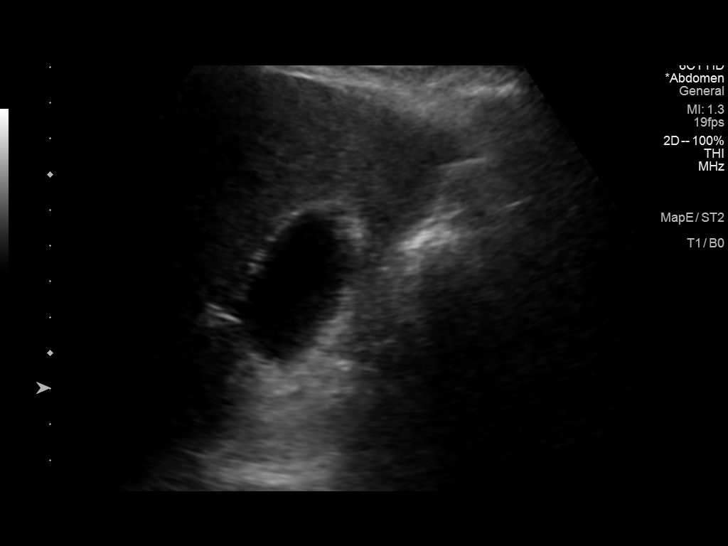
[im 14/80]
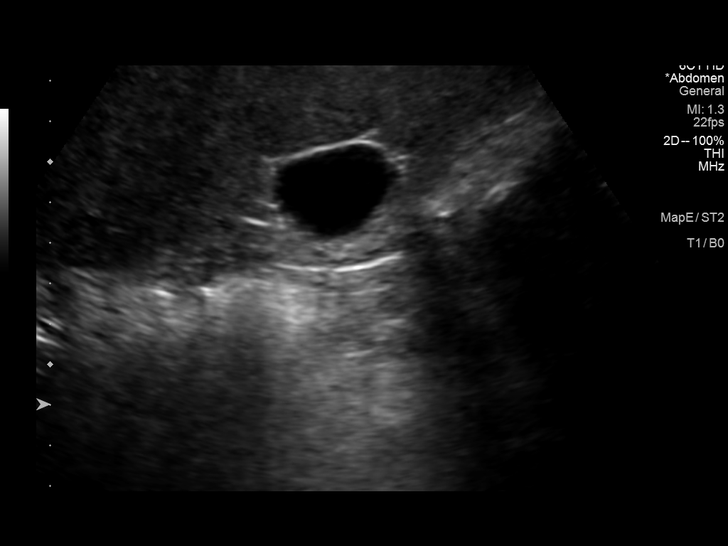
[im 20/80]
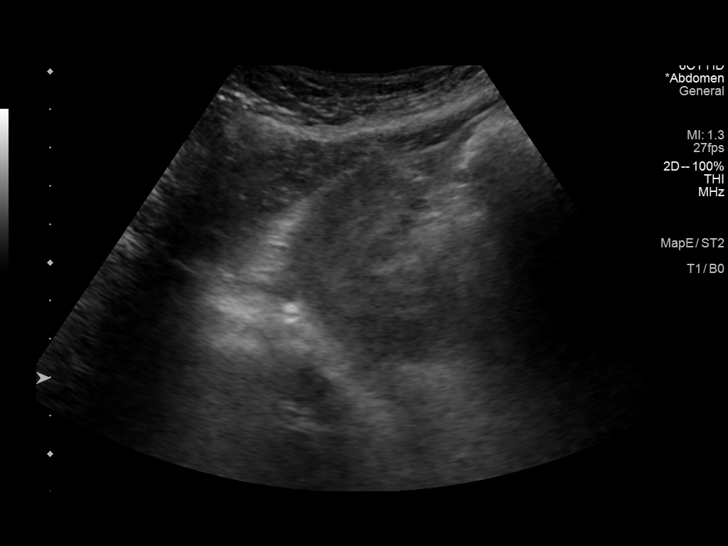
[im 27/80]
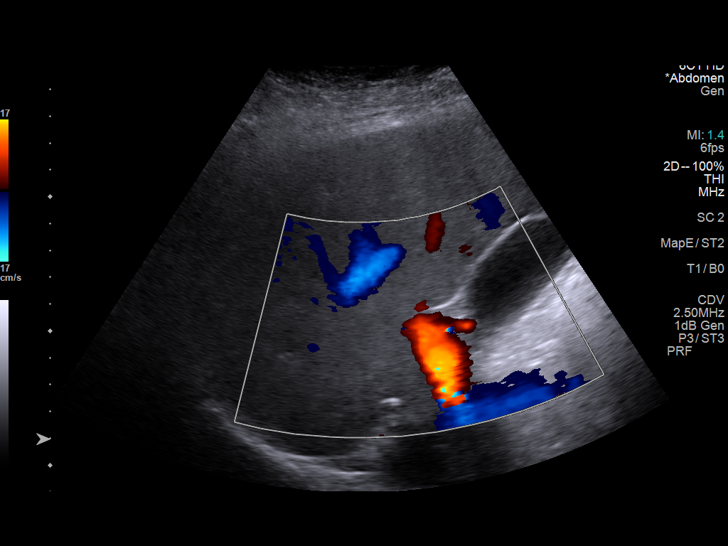
[im 30/80]
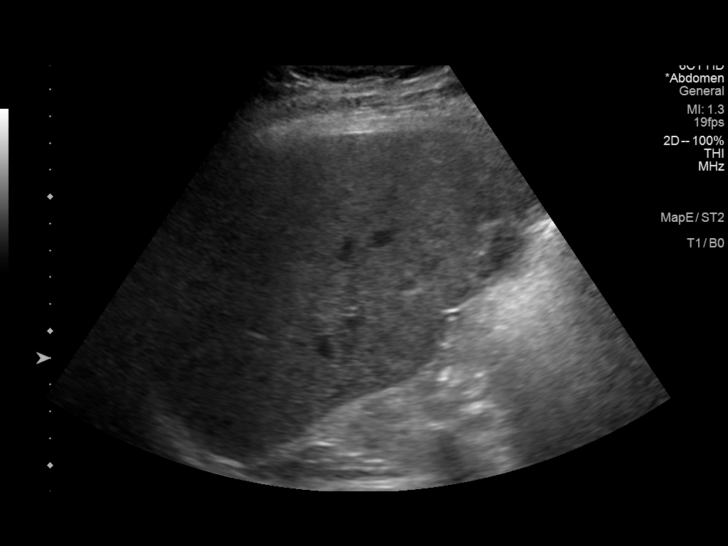
[im 37/80]
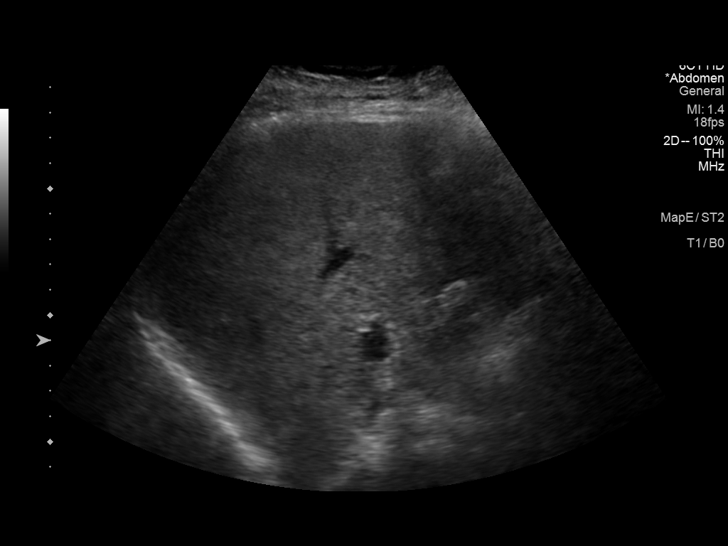
[im 43/80]
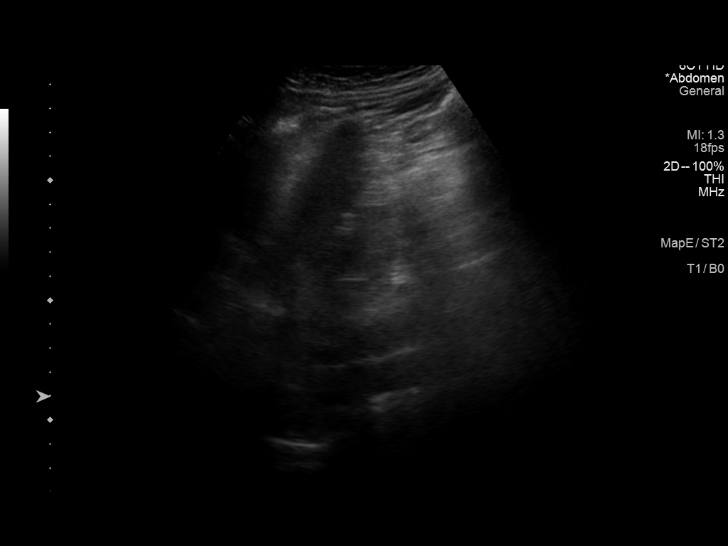
[im 50/80]
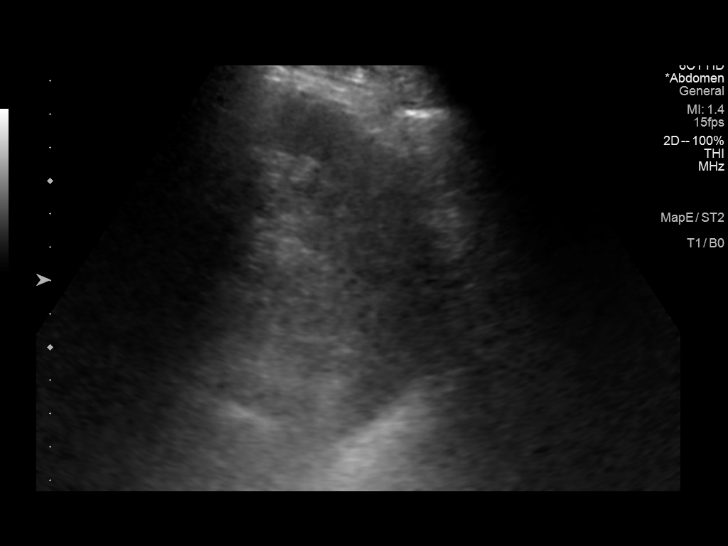
[im 53/80]
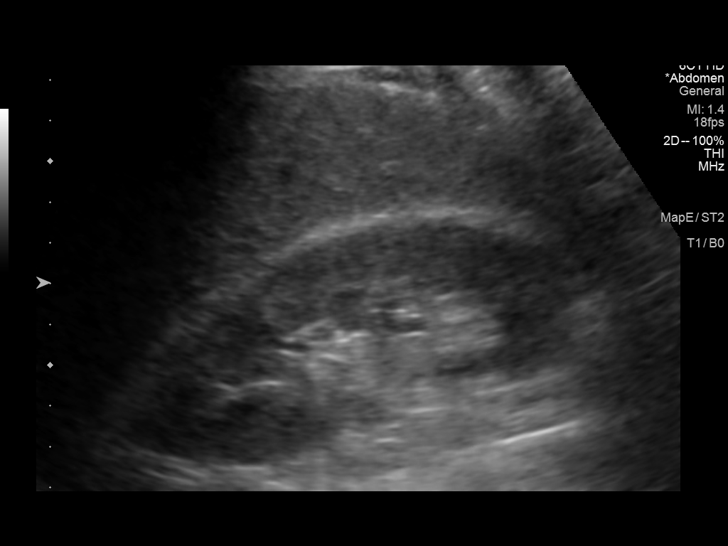
[im 60/80]
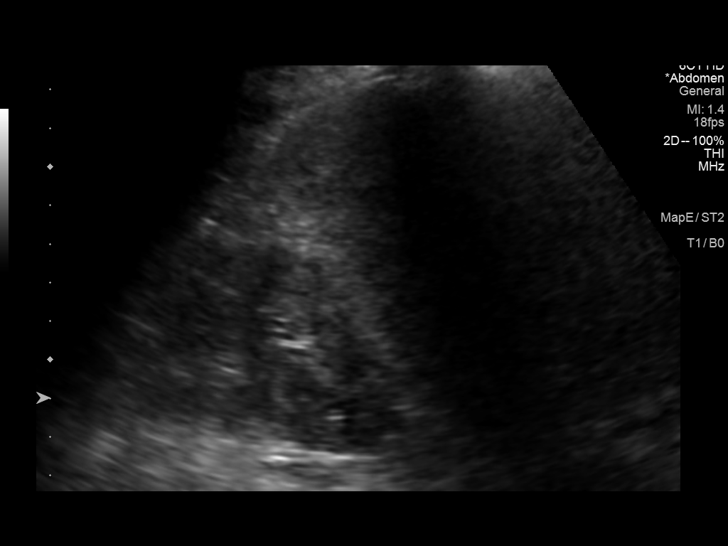
[im 66/80]
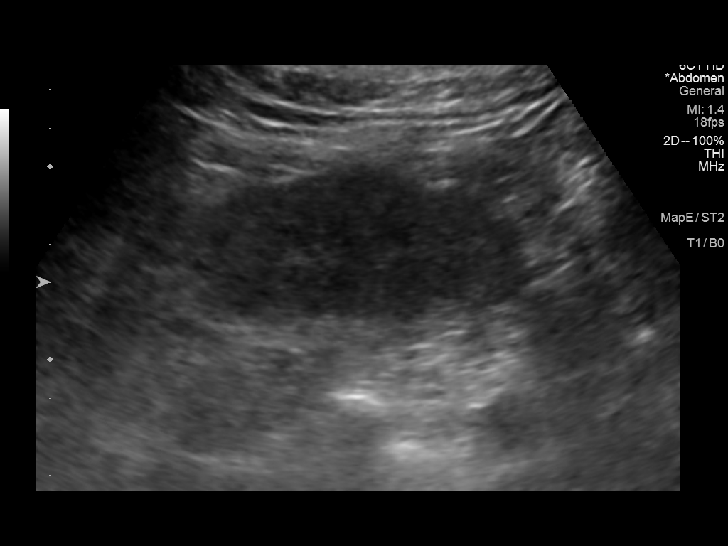
[im 73/80]
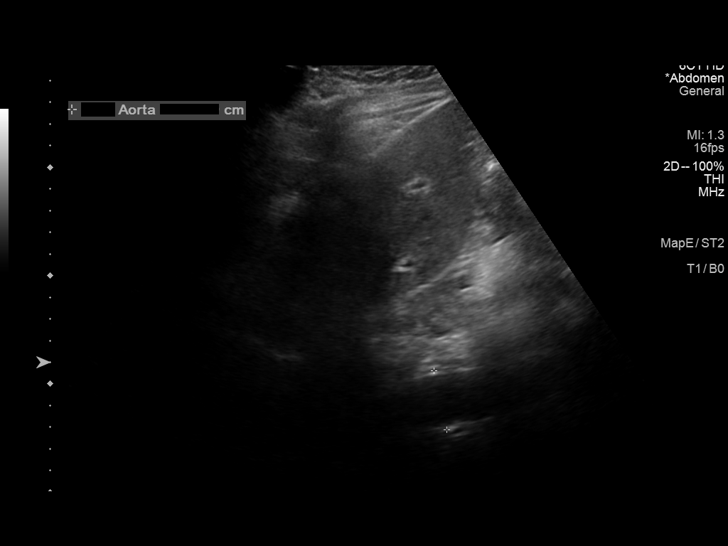
[im 80/80]
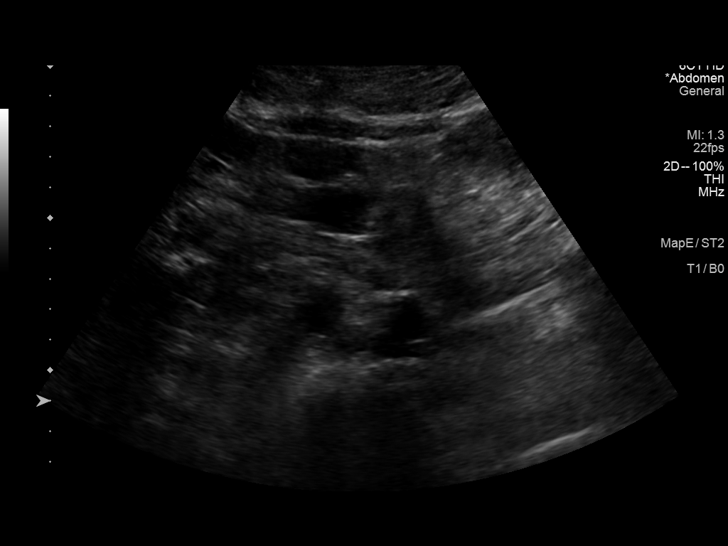

[14 of 25 positions shown; findings below may reference images not displayed]

FINDINGS: Gallbladder: No gallstones or wall thickening visualized. No
sonographic Murphy sign noted by sonographer.

Common bile duct: Diameter: 5.2 mm, within normal limits.

Liver: No focal lesion identified. Increased parenchymal
echogenicity. Portal vein is patent on color Doppler imaging with
normal direction of blood flow towards the liver.

IVC: No gross abnormality visualized. Overlying bowel gas limits
evaluation.

Pancreas: Not visualized secondary to overlying bowel gas.

Spleen: Size and appearance within normal limits.

Right Kidney: Length: 11.5. Echogenicity within normal limits. No
mass or hydronephrosis visualized.

Left Kidney: Length: 12.1. Echogenicity within normal limits. No
mass or hydronephrosis visualized.

Abdominal aorta: No aneurysm visualized.

Other findings: None.
IMPRESSION: Hepatic steatosis.  No focal hepatic lesion.

Otherwise unremarkable abdominal ultrasound.

## 2020-11-23 NOTE — Progress Notes (Addendum)
HEART AND [place]                                     Cardiology Office Note:    Date:  11/25/2020   ID:  Evan [name], DOB 07-Jan-1956, MRN [id number]  PCP:  Ria Bush, MD  [facility] Cardiologist:  Elouise Munroe, MD / Dr. Angelena Form & Dr. Cyndia Bent (TAVR) [facility] Electrophysiologist:  None   Referring MD: Ria Bush, MD   1 month s/p TAVR  History of Present Illness:    Evan [name] is a 65 y.o. male with a hx of depression, GERD, HTN, HLD, fatty liver disease, DMT2 and bicuspid aortic valve with severe aortic stenosis s/p TAVR (10/29/20) who presents to clinic for follow up.   He has been followed for moderate aortic stenosis with a bicuspid aortic valve by Dr. Margaretann Loveless. Echo 07/11/20 showed LVEF=55-60%, severe LVH, bicuspid AoV with severe calcification of the aortic valve leaflets with a mean gradient of 41 mmHg, peak gradient 59 mmHg. AVA 1.24 cm2, dimensionless index 0.35. He was referred to Dr. Angelena Form with the structural heart team. At this visit, he described resting chest pain but no exertional dyspnea or chest pain. Cardiac cath 08/15/20 showed no evidence of CAD. Gated cardiac CT 08/22/20 with bicuspid aortic valve with thickened and calcified leaflets with partial fusion of the right and left coronary cusps with severely reduced cusp separation c/w severe AS. Valve calcium score of 2886. AV area 559 mm2 suitable for a 29 mm Edwards Sapien 3 valve (Edwards rep has commented on the use of a 26 mm Sapien 3 valve).  CTA of the chest/abdomen and pelvis with anatomy suitable for transfemoral approach to TAVR. Incidental finding of a hypervascular lesion in the right lobe of the liver as well as left adrenal nodule. Follow up MRI favored focal nodular hyperplasia and confirmed the presence of an adrenal nodule. He was seen back in the office in April and reported chest pressure and palpitations. The patient was not  interested in pursuing open heart surgery. He was felt to be a reasonable candidate for TAVR which was scheduled for 10/29/20. He was seen in the ER for chest pain and shortness of breath. He ruled out for MI and overall examination was normal. He was discharged home with plans to return for planned TAVR 10/29/20.  He was evaluated by the multidisciplinary valve team and underwent successful TAVR with a 29 mm Edwards Sapien 3 THV via the TF approach on 10/29/20. Post operative echo showed EF 55%, normally functioning TAVR with a mean gradient of 6.0 mmHg and trivial PVL. He was discharged home on aspirin and started on Plavix 75 mg daily. Prilosec was changed to Protonix given potential drug drug interaction with Plavix. He has done well in follow up with improvement in chest pain.  Today he presents to clinic for follow up. No CP or SOB. No LE edema, orthopnea or PND. No dizziness or syncope. No blood in stool or urine. No palpitations.     Past Medical History:  Diagnosis Date   Adrenal adenoma    Complication of anesthesia    difficulty to weak up   COVID-19 virus infection 05/2020   Depression    intolerant of cymbalta and wellbutrin   Diverticulosis of colon    Esophagitis 03/2015   by EGD   Gastritis  03/2015   by EGD - mild, chronic   Hyperlipidemia    Hypertension    Left ventricular hypertrophy    Lichen simplex chronicus 05/2015   R scrotum [name])   Liver lesion    2022 CTs: There was an incidental finding of a hypervascular lesion in the right lobe of the liver as well as left adrenal nodule. He underwent MRI of the abdomen showing the appearance of the liver favored focal nodular hyperplasia, as well as small left adrenal adenoma.   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  Angelena Form & Dr. Cyndia Bent    Severe aortic stenosis    T2DM (type 2 diabetes mellitus) ([place])    DSME 09/2013    Past Surgical History:  Procedure Laterality Date   COLONOSCOPY  2006   diverticulosis per pt   COLONOSCOPY  03/2015   2 polyps, rpt 5 yrs [name])   ESOPHAGOGASTRODUODENOSCOPY  03/2015   mild chronic gastritis, esophagitis    INTRAOPERATIVE TRANSTHORACIC ECHOCARDIOGRAM Left 10/29/2020   Procedure: INTRAOPERATIVE TRANSTHORACIC ECHOCARDIOGRAM;  Surgeon: Burnell Blanks, MD;  Location: [place];  Service: Open Heart Surgery;  Laterality: Left;   LUMBAR LAMINECTOMY  1989   L5-S1   RIGHT/LEFT HEART CATH AND CORONARY ANGIOGRAPHY N/A 08/15/2020   Procedure: RIGHT/LEFT HEART CATH AND CORONARY ANGIOGRAPHY;  Surgeon: Burnell Blanks, MD;  Location: [place] [facility];  Service: Cardiovascular;  Laterality: N/A;   TRANSCATHETER AORTIC VALVE REPLACEMENT, TRANSFEMORAL Bilateral 10/29/2020   Procedure: TRANSCATHETER AORTIC VALVE REPLACEMENT, TRANSFEMORAL;  Surgeon: Burnell Blanks, MD;  Location: [place];  Service: Open Heart Surgery;  Laterality: Bilateral;   ULTRASOUND GUIDANCE FOR VASCULAR ACCESS Bilateral 10/29/2020   Procedure: ULTRASOUND GUIDANCE FOR VASCULAR ACCESS;  Surgeon: Burnell Blanks, MD;  Location: [place];  Service: Open Heart Surgery;  Laterality: Bilateral;    Current Medications: Current Meds  Medication Sig   amLODipine (NORVASC) 5 MG tablet Take 1 tablet (5 mg total) by mouth daily.   aspirin EC 81 MG tablet Take 81 mg by mouth every evening. Swallow whole.   Blood Glucose Monitoring Suppl (ONETOUCH VERIO FLEX SYSTEM) w/Device KIT 1 kit by Does not apply route daily. Use to check blood sugars 2 times daily   cephALEXin (KEFLEX) 500 MG capsule Take 4 capsules (2,000 mg) one hour prior to all dental visits.   Cholecalciferol (VITAMIN D3) 50 MCG (2000 UT) TABS Take 2,000 Units by mouth daily.   ciprofloxacin-dexamethasone (CIPRODEX) OTIC suspension  SMARTSIG:4 Drop(s) Left Ear Twice Daily   clopidogrel (PLAVIX) 75 MG tablet Take 1 tablet (75 mg total) by mouth daily with breakfast.   Cyanocobalamin (VITAMIN B-12) 2500 MCG SUBL Take 2,500 mcg by mouth daily.   dapagliflozin propanediol (FARXIGA) 10 MG TABS tablet Take 5 mg by mouth daily.   fluticasone (FLONASE) 50 MCG/ACT nasal spray Place 1 spray into both nostrils daily as needed for allergies or rhinitis.   losartan (COZAAR) 100 MG tablet Take 50 mg by mouth daily.   Magnesium 250 MG TABS Take 250 mg by mouth every evening.   metFORMIN (GLUCOPHAGE) 1000 MG tablet TAKE 1 TABLET (1,000 MG TOTAL) BY MOUTH 2 TIMES DAILY WITH A MEAL. (Patient taking differently: Take 1,000 mg by mouth every evening.)   ONETOUCH VERIO test  strip USE TO CHECK BLOOD SUGARS 2 TIMES DAILY   pantoprazole (PROTONIX) 40 MG tablet Take 1 tablet (40 mg total) by mouth daily.   rosuvastatin (CRESTOR) 5 MG tablet Take 1 tablet (5 mg total) by mouth daily.   Semaglutide, 1 MG/DOSE, (OZEMPIC, 1 MG/DOSE,) 4 MG/3ML SOPN Inject 1 mg as directed once a week. (Patient taking differently: Inject 0.5 mg as directed every Monday.)   sildenafil (REVATIO) 20 MG tablet Take 2-5 tablets (40-100 mg total) by mouth daily as needed (relations).   TURMERIC PO Take 1,800 mg by mouth every evening.   Vitamin D-Vitamin K (K2 PLUS D3 PO) Take 1 tablet by mouth every evening.   Zinc 50 MG TABS Take 50 mg by mouth every evening.     Allergies:   Penicillins   Social History   Socioeconomic History   Marital status: Married    Spouse name: Not on file   Number of children: 2   Years of education: Not on file   Highest education level: Not on file  Occupational History   Occupation: Retired-Worked for the [employer]  Tobacco Use   Smoking status: Never   Smokeless tobacco: Never  Substance and Sexual Activity   Alcohol use: Not Currently    Alcohol/week: 0.0 standard drinks    Comment: occasional   Drug use: No   Sexual  activity: Not on file  Other Topics Concern   Not on file  Social History Narrative   Caffeine: 3 cups coffee/day   Lives with wife and daughter, son in college, 2 cats and 1 dog   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  Keeps record of caloric intake   Social Determinants of Health   Financial Resource Strain: Not on file  Food Insecurity: Not on file  Transportation Needs: Not on file  Physical Activity: Not on file  Stress: Not on file  Social Connections: Not on file     Family History: The patient's family history includes Alcohol abuse in his brother and mother; Alzheimer's disease in his father; CAD in his brother; Coronary artery disease in his maternal uncle; Diabetes in his brother and maternal grandfather; Emphysema in his mother; Hyperlipidemia in his father; Hypertension in his father; Prostate cancer in his brother; Schizophrenia in his paternal uncle; Stroke in his father, maternal grandfather, maternal grandmother, paternal grandfather, and paternal grandmother.  ROS:   Please see the history of present illness.    All other systems reviewed and are negative.  EKGs/Labs/Other Studies Reviewed:    The following studies were reviewed today:  TAVR OPERATIVE NOTE     Date of Procedure:                10/29/2020   Preoperative Diagnosis:      Severe Aortic Stenosis    Postoperative Diagnosis:    Same    Procedure:        Transcatheter Aortic Valve Replacement - Percutaneous Right Transfemoral Approach             Edwards Sapien 3  THV (size 29 mm, model # 9600TFX, serial # [id number]              Co-Surgeons:                        BGaye Pollack MD and  CLauree Chandler MD  Anesthesiologist:                  Renaldo Reel, MD   Echocardiographer:              Osborne Oman, MD   Pre-operative Echo Findings: Severe aortic stenosis Normal  left ventricular systolic function   Post-operative Echo Findings: No paravalvular leak Normal left ventricular systolic function   _____________     Echo 10/30/20:  IMPRESSIONS   1. The aortic valve has been repaired/replaced. Aortic valve  regurgitation is not visualized. There is a 29 mm Sapien prosthetic (TAVR)  valve present in the aortic position. Procedure Date: 10/29/2020. Aortic  valve area, by VTI measures 5.17 cm. Aortic  valve mean gradient measures 6.0 mmHg. Aortic valve Vmax measures 1.51  m/s. Aortic valve acceleration time measures 84 msec. Trivial paravalular  leak at the 12 o'Clock Position; DVI 78.   2. Left ventricular ejection fraction, by estimation, is 55 to 60%. The  left ventricle has normal function.   3. Right ventricular systolic function is normal. The right ventricular  size is normal.   4. The mitral valve is grossly normal. No evidence of mitral valve  regurgitation.   Comparison(s): A prior study was performed on 10/29/20. Stable placement of  TAVR valve.   _______________________   Echo 10/30/2020 IMPRESSIONS   1. The aortic valve has been repaired/replaced. Aortic valve  regurgitation is not visualized. There is a 29 mm Sapien prosthetic (TAVR)  valve present in the aortic position. Procedure Date: 10/29/2020. Aortic  valve area, by VTI measures 5.17 cm. Aortic  valve mean gradient measures 6.0 mmHg. Aortic valve Vmax measures 1.51  m/s. Aortic valve acceleration time measures 84 msec. Trivial paravalular  leak at the 12 o'Clock Position; DVI 78.   2. Left ventricular ejection fraction, by estimation, is 55 to 60%. The  left ventricle has normal function.   3. Right ventricular systolic function is normal. The right ventricular  size is normal.   4. The mitral valve is grossly normal. No evidence of mitral valve  regurgitation.   Comparison(s): A prior study was performed on 10/29/20. Stable placement of  TAVR valve.    __________________________   Echo 11/25/20 IMPRESSIONS  1. Left ventricular ejection fraction by 3D volume is 66 %. The left  ventricle has normal function. The left ventricle has no regional wall  motion abnormalities. Left ventricular diastolic parameters were normal.   2. Right ventricular systolic function is normal. The right ventricular  size is normal. There is normal pulmonary artery systolic pressure.   3. The mitral valve is grossly normal. No evidence of mitral valve  regurgitation.   4. The aortic valve has been repaired/replaced. Aortic valve  regurgitation is not visualized.    EKG:  EKG is NOT ordered today.   Recent Labs: 10/28/2020: ALT 29 10/30/2020: Hemoglobin 13.0; Magnesium 1.9; Platelets 221 11/12/2020: BUN 14; Creatinine, Ser 0.85; Potassium 4.0; Sodium 138  Recent Lipid Panel    Component Value Date/Time   CHOL 165 03/11/2020 0907   TRIG 59.0 03/11/2020 0907   HDL 82.50 03/11/2020 0907   CHOLHDL 2 03/11/2020 0907   VLDL 11.8 03/11/2020 0907   LDLCALC 71 03/11/2020 0907   LDLDIRECT 99.0 05/20/2015 0955     Risk Assessment/Calculations:       Physical Exam:    VS:  BP 134/78   Pulse 87   Ht '5\' 11"'  (1.803 m)   Wt 217 lb 6.4 oz (98.6 kg)  SpO2 95%   BMI 30.32 kg/m     Wt Readings from Last 3 Encounters:  11/25/20 217 lb 6.4 oz (98.6 kg)  11/19/20 220 lb 3 oz (99.9 kg)  11/14/20 220 lb 9.6 oz (100.1 kg)     GEN:  Well nourished, well developed in no acute distress HEENT: Normal NECK: No JVD LYMPHATICS: No lymphadenopathy CARDIAC: RRR, soft flow murmur @ RSUB. No rubs, gallops RESPIRATORY:  Clear to auscultation without rales, wheezing or rhonchi  ABDOMEN: Soft, non-tender, non-distended MUSCULOSKELETAL:  No edema; No deformity  SKIN: Warm and dry.  NEUROLOGIC:  Alert and oriented x 3 PSYCHIATRIC:  Normal affect   ASSESSMENT:    1. S/P TAVR (transcatheter aortic valve replacement)   2. Essential hypertension   3. Liver lesion    4. Adrenal nodule (HCC)     PLAN:    In order of problems listed above:  Severe AS s/p TAVR: echo today shows EF 66%, normally functioning TAVR with a mean gradient of 9.5 mm hg and no PVL. He has NYHA class I symptoms. Continue on aspirin and plavix. He can stop his plavix after 6 months of therapy ( 06/2020). He has Keflex for SBE prophylaxis. I will see him back in 1 year for follow up with echo.   HTN: BP okay in the office but has been elevated consistently at home. He is currently on Losartan 32m (previously cut back from 100 mg by his endocrinologist). Will start Norvasc 57mdaily. He will keep a log for Dr. GuGae Bonho he see's for his Medicare Well Exam next month.   Liver lesion: pre TAVR CT showed a 2.3 x 1.5 cm hypervascular lesion in the right lobe of the liver, not evident on prior examination from 2018. This may represent a benign perfusion anomaly or flash fill cavernous hemangioma, however, the possibility of an aggressive hypervascular lesion is not excluded. Abdominal MRI was ordered which favored focal nodular hyperplasia. Radiologist wrote "If the patient has abnormal liver enzymes or if otherwise clinically warranted, these could be surveilled with follow hepatic protocol MRI in 1 years time. If such follow up is elected, I would recommend using Eovist contrast medium with the MRI, which can provide even greater diagnostic specificity for FNH." Of note, recent LFTs were normal. Will defer need for further imaging to PCP.  Adrenal nodule: 1.5 cm left adrenal nodule similar in retrospect to remote prior study from 2018, favored to represent a benign lesions such as a lipid poor adenoma. MRI confirmed adrenal nodule. No further work up neccessary    Medication Adjustments/Labs and Tests Ordered: Current medicines are reviewed at length with the patient today.  Concerns regarding medicines are outlined above.  No orders of the defined types were placed in this  encounter.  Meds ordered this encounter  Medications   amLODipine (NORVASC) 5 MG tablet    Sig: Take 1 tablet (5 mg total) by mouth daily.    Dispense:  90 tablet    Refill:  3     Patient Instructions  Medication Instructions:  1) START Amlodipine 82m582mnce daily 2) You may stop Plavix in November when you run out of medication.   *If you need a refill on your cardiac medications before your next appointment, please call your pharmacy*   Lab Work: None If you have labs (blood work) drawn today and your tests are completely normal, you will receive your results only by: [facility] you have MyChart) OR A paper  copy in the mail If you have any lab test that is abnormal or we need to change your treatment, we will call you to review the results.   Testing/Procedures: None   Follow-Up: At [facility], you and your health needs are our priority.  As part of our continuing mission to provide you with exceptional heart care, we have created designated Provider Care Teams.  These Care Teams include your primary Cardiologist (physician) and Advanced Practice Providers (APPs -  Physician Assistants and Nurse Practitioners) who all work together to provide you with the care you need, when you need it.  We recommend signing up for the patient portal called "MyChart".  Sign up information is provided on this After Visit Summary.  MyChart is used to connect with patients for Virtual Visits (Telemedicine).  Patients are able to view lab/test results, encounter notes, upcoming appointments, etc.  Non-urgent messages can be sent to your provider as well.   To learn more about what you can do with MyChart, go to [email or web address].    Your next appointment:   6 month(s)  The format for your next appointment:   In Person  Provider:   You may see Elouise Munroe, MD or one of the following Advanced Practice Providers on your designated Care Team:   Rosaria Ferries,  PA-C Jory Sims, DNP, ANP   Other Instructions     Signed, Angelena Form, PA-C  11/25/2020 4:28 PM    [place]

## 2020-11-25 ENCOUNTER — Ambulatory Visit (INDEPENDENT_AMBULATORY_CARE_PROVIDER_SITE_OTHER): Payer: Medicare Other | Admitting: Physician Assistant

## 2020-11-25 ENCOUNTER — Encounter: Payer: Self-pay | Admitting: Physician Assistant

## 2020-11-25 ENCOUNTER — Ambulatory Visit (HOSPITAL_COMMUNITY): Payer: Medicare Other | Attending: Cardiovascular Disease

## 2020-11-25 ENCOUNTER — Other Ambulatory Visit: Payer: Self-pay

## 2020-11-25 VITALS — BP 134/78 | HR 87 | Ht 71.0 in | Wt 217.4 lb

## 2020-11-25 DIAGNOSIS — K769 Liver disease, unspecified: Secondary | ICD-10-CM | POA: Diagnosis not present

## 2020-11-25 DIAGNOSIS — Z952 Presence of prosthetic heart valve: Secondary | ICD-10-CM | POA: Diagnosis not present

## 2020-11-25 DIAGNOSIS — I10 Essential (primary) hypertension: Secondary | ICD-10-CM | POA: Diagnosis not present

## 2020-11-25 DIAGNOSIS — E278 Other specified disorders of adrenal gland: Secondary | ICD-10-CM | POA: Diagnosis not present

## 2020-11-25 DIAGNOSIS — E1169 Type 2 diabetes mellitus with other specified complication: Secondary | ICD-10-CM

## 2020-11-25 LAB — ECHOCARDIOGRAM COMPLETE
AR max vel: 2.95 cm2
AV Area VTI: 3.16 cm2
AV Area mean vel: 3.01 cm2
AV Mean grad: 9.5 mmHg
AV Peak grad: 16.8 mmHg
Ao pk vel: 2.05 m/s
Area-P 1/2: 2.97 cm2
S' Lateral: 2.23 cm

## 2020-11-25 MED ORDER — AMLODIPINE BESYLATE 5 MG PO TABS: 5.0000 mg | ORAL_TABLET | Freq: Every day | ORAL | 3 refills | Status: DC

## 2020-11-25 NOTE — Patient Instructions (Signed)
Medication Instructions:  1) START Amlodipine 5mg  once daily 2) You may stop Plavix in November when you run out of medication.   *If you need a refill on your cardiac medications before your next appointment, please call your pharmacy*   Lab Work: None If you have labs (blood work) drawn today and your tests are completely normal, you will receive your results only by: [place] (if you have MyChart) OR A paper copy in the mail If you have any lab test that is abnormal or we need to change your treatment, we will call you to review the results.   Testing/Procedures: None   Follow-Up: At [facility], you and your health needs are our priority.  As part of our continuing mission to provide you with exceptional heart care, we have created designated Provider Care Teams.  These Care Teams include your primary Cardiologist (physician) and Advanced Practice Providers (APPs -  Physician Assistants and Nurse Practitioners) who all work together to provide you with the care you need, when you need it.  We recommend signing up for the patient portal called "MyChart".  Sign up information is provided on this After Visit Summary.  MyChart is used to connect with patients for Virtual Visits (Telemedicine).  Patients are able to view lab/test results, encounter notes, upcoming appointments, etc.  Non-urgent messages can be sent to your provider as well.   To learn more about what you can do with MyChart, go to [email or web address].    Your next appointment:   6 month(s)  The format for your next appointment:   In Person  Provider:   You may see Elouise Munroe, MD or one of the following Advanced Practice Providers on your designated Care Team:   Rosaria Ferries, PA-C Jory Sims, DNP, ANP   Other Instructions

## 2020-11-27 MED ORDER — OZEMPIC (0.25 OR 0.5 MG/DOSE) 2 MG/1.5ML ~~LOC~~ SOPN: 0.5000 mg | PEN_INJECTOR | SUBCUTANEOUS | 2 refills | Status: DC

## 2020-11-27 NOTE — Telephone Encounter (Signed)
Plz call to see if has ENT appt soon. If not please offer 1pm appt today for evaluation.

## 2020-12-03 DIAGNOSIS — B369 Superficial mycosis, unspecified: Secondary | ICD-10-CM | POA: Diagnosis not present

## 2020-12-03 DIAGNOSIS — H6242 Otitis externa in other diseases classified elsewhere, left ear: Secondary | ICD-10-CM | POA: Diagnosis not present

## 2020-12-03 DIAGNOSIS — H9012 Conductive hearing loss, unilateral, left ear, with unrestricted hearing on the contralateral side: Secondary | ICD-10-CM | POA: Diagnosis not present

## 2020-12-21 ENCOUNTER — Other Ambulatory Visit: Payer: Self-pay | Admitting: Family Medicine

## 2020-12-21 DIAGNOSIS — E1169 Type 2 diabetes mellitus with other specified complication: Secondary | ICD-10-CM

## 2020-12-21 DIAGNOSIS — E118 Type 2 diabetes mellitus with unspecified complications: Secondary | ICD-10-CM

## 2020-12-21 DIAGNOSIS — R972 Elevated prostate specific antigen [PSA]: Secondary | ICD-10-CM

## 2020-12-25 ENCOUNTER — Other Ambulatory Visit: Payer: Self-pay

## 2020-12-25 ENCOUNTER — Other Ambulatory Visit (INDEPENDENT_AMBULATORY_CARE_PROVIDER_SITE_OTHER): Payer: Medicare Other

## 2020-12-25 ENCOUNTER — Ambulatory Visit (INDEPENDENT_AMBULATORY_CARE_PROVIDER_SITE_OTHER): Payer: Medicare Other

## 2020-12-25 DIAGNOSIS — E785 Hyperlipidemia, unspecified: Secondary | ICD-10-CM | POA: Diagnosis not present

## 2020-12-25 DIAGNOSIS — E118 Type 2 diabetes mellitus with unspecified complications: Secondary | ICD-10-CM | POA: Diagnosis not present

## 2020-12-25 DIAGNOSIS — R972 Elevated prostate specific antigen [PSA]: Secondary | ICD-10-CM

## 2020-12-25 DIAGNOSIS — Z0000 Encounter for general adult medical examination without abnormal findings: Secondary | ICD-10-CM | POA: Diagnosis not present

## 2020-12-25 DIAGNOSIS — E1169 Type 2 diabetes mellitus with other specified complication: Secondary | ICD-10-CM

## 2020-12-25 LAB — COMPREHENSIVE METABOLIC PANEL
ALT: 29 U/L (ref 0–53)
AST: 27 U/L (ref 0–37)
Albumin: 4.6 g/dL (ref 3.5–5.2)
Alkaline Phosphatase: 46 U/L (ref 39–117)
BUN: 14 mg/dL (ref 6–23)
CO2: 27 mEq/L (ref 19–32)
Calcium: 9.8 mg/dL (ref 8.4–10.5)
Chloride: 95 mEq/L — ABNORMAL LOW (ref 96–112)
Creatinine, Ser: 0.87 mg/dL (ref 0.40–1.50)
GFR: 89.27 mL/min (ref >60.00)
Glucose, Bld: 111 mg/dL — ABNORMAL HIGH (ref 70–99)
Potassium: 4.4 mEq/L (ref 3.5–5.1)
Sodium: 133 mEq/L — ABNORMAL LOW (ref 135–145)
Total Bilirubin: 0.5 mg/dL (ref 0.2–1.2)
Total Protein: 7.3 g/dL (ref 6.0–8.3)

## 2020-12-25 LAB — LIPID PANEL
Cholesterol: 186 mg/dL (ref 0–200)
HDL: 91.4 mg/dL (ref >39.00)
LDL Cholesterol: 82 mg/dL (ref 0–99)
NonHDL: 94.17
Total CHOL/HDL Ratio: 2
Triglycerides: 59 mg/dL (ref 0.0–149.0)
VLDL: 11.8 mg/dL (ref 0.0–40.0)

## 2020-12-25 LAB — PSA: PSA: 0.58 ng/mL (ref 0.10–4.00)

## 2020-12-25 NOTE — Progress Notes (Signed)
PCP notes:  Health Maintenance: Shingrix- due   Abnormal Screenings: none   Patient concerns: none   Nurse concerns: none   Next PCP appt.: 12/30/2020 @ 3 pm

## 2020-12-25 NOTE — Progress Notes (Signed)
Subjective:   Evan [name] is a 65 y.o. male who presents for Medicare Annual/Subsequent preventive examination.  Review of Systems: N/A      I connected with the patient today by telephone and verified that I am speaking with the correct person using two identifiers. Location patient: home Location nurse: work Persons participating in the telephone visit: patient, nurse.   I discussed the limitations, risks, security and privacy concerns of performing an evaluation and management service by telephone and the availability of in person appointments. I also discussed with the patient that there may be a patient responsible charge related to this service. The patient expressed understanding and verbally consented to this telephonic visit.        Cardiac Risk Factors include: advanced age (>66mn, >>62women);diabetes mellitus;hypertension;male gender     Objective:    Today's Vitals   There is no height or weight on file to calculate BMI.  Advanced Directives 12/25/2020 10/29/2020 10/28/2020 08/15/2020 02/18/2017 10/10/2013  Does Patient Have a Medical Advance Directive? _0  Patient has advance directive, copy not in chart  Type of Advance Directive [place] will Living will [place] will [place] will - -  Copy of [place] Chart? No - copy requested - - - - -    Current Medications (verified) Outpatient Encounter Medications as of 12/25/2020  Medication Sig   amLODipine (NORVASC) 5 MG tablet Take 1 tablet (5 mg total) by mouth daily.   aspirin EC 81 MG tablet Take 81 mg by mouth every evening. Swallow whole.   Blood Glucose Monitoring Suppl (ONETOUCH VERIO FLEX SYSTEM) w/Device KIT 1 kit by Does not apply route daily. Use to check blood sugars 2 times daily   cephALEXin (KEFLEX) 500 MG capsule Take 4 capsules (2,000 mg) one hour prior to all dental visits.    Cholecalciferol (VITAMIN D3) 50 MCG (2000 UT) TABS Take 2,000 Units by mouth daily.   ciprofloxacin-dexamethasone (CIPRODEX) OTIC suspension SMARTSIG:4 Drop(s) Left Ear Twice Daily   clopidogrel (PLAVIX) 75 MG tablet Take 1 tablet (75 mg total) by mouth daily with breakfast.   Cyanocobalamin (VITAMIN B-12) 2500 MCG SUBL Take 2,500 mcg by mouth daily.   dapagliflozin propanediol (FARXIGA) 10 MG TABS tablet Take 5 mg by mouth daily.   fluticasone (FLONASE) 50 MCG/ACT nasal spray Place 1 spray into both nostrils daily as needed for allergies or rhinitis.   losartan (COZAAR) 100 MG tablet Take 50 mg by mouth daily.   Magnesium 250 MG TABS Take 250 mg by mouth every evening.   metFORMIN (GLUCOPHAGE) 1000 MG tablet TAKE 1 TABLET (1,000 MG TOTAL) BY MOUTH 2 TIMES DAILY WITH A MEAL. (Patient taking differently: Take 1,000 mg by mouth every evening.)   ONETOUCH VERIO test strip USE TO CHECK BLOOD SUGARS 2 TIMES DAILY   pantoprazole (PROTONIX) 40 MG tablet Take 1 tablet (40 mg total) by mouth daily.   rosuvastatin (CRESTOR) 5 MG tablet TAKE 1 TABLET BY MOUTH EVERY DAY   Semaglutide,0.25 or 0.5MG/DOS, (OZEMPIC, 0.25 OR 0.5 MG/DOSE,) 2 MG/1.5ML SOPN Inject 0.5 mg into the skin once a week.   sildenafil (REVATIO) 20 MG tablet Take 2-5 tablets (40-100 mg total) by mouth daily as needed (relations).   TURMERIC PO Take 1,800 mg by mouth every evening.   Vitamin D-Vitamin K (K2 PLUS D3 PO) Take 1 tablet by mouth every evening.   Zinc 50 MG TABS Take  50 mg by mouth every evening.   No facility-administered encounter medications on file as of 12/25/2020.    Allergies (verified) Penicillins   History: Past Medical History:  Diagnosis Date   Adrenal adenoma    Complication of anesthesia    difficulty to weak up   COVID-19 virus infection 05/2020   Depression    intolerant of cymbalta and wellbutrin   Diverticulosis of colon    Esophagitis 03/2015   by EGD   Gastritis 03/2015   by EGD - mild, chronic    Hyperlipidemia    Hypertension    Left ventricular hypertrophy    Lichen simplex chronicus 05/2015   R scrotum [name])   Liver lesion    2022 CTs: There was an incidental finding of a hypervascular lesion in the right lobe of the liver as well as left adrenal nodule. He underwent MRI of the abdomen showing the appearance of the liver favored focal nodular hyperplasia, as well as small left adrenal adenoma.   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 Angelena Form & Dr. Cyndia Bent    Severe aortic stenosis    T2DM (type 2 diabetes mellitus) ([place])    DSME 09/2013   Past Surgical History:  Procedure Laterality Date   COLONOSCOPY  2006   diverticulosis per pt   COLONOSCOPY  03/2015   2 polyps, rpt 5 yrs [name])   ESOPHAGOGASTRODUODENOSCOPY  03/2015   mild chronic gastritis, esophagitis    INTRAOPERATIVE TRANSTHORACIC ECHOCARDIOGRAM Left 10/29/2020   Procedure: INTRAOPERATIVE TRANSTHORACIC ECHOCARDIOGRAM;  Surgeon: Burnell Blanks, MD;  Location: [facility];  Service: Open Heart Surgery;  Laterality: Left;   LUMBAR LAMINECTOMY  1989   L5-S1   RIGHT/LEFT HEART CATH AND CORONARY ANGIOGRAPHY N/A 08/15/2020   Procedure: RIGHT/LEFT HEART CATH AND CORONARY ANGIOGRAPHY;  Surgeon: Burnell Blanks, MD;  Location: [facility];  Service: Cardiovascular;  Laterality: N/A;   TRANSCATHETER AORTIC VALVE REPLACEMENT, TRANSFEMORAL Bilateral 10/29/2020   Procedure: TRANSCATHETER AORTIC VALVE REPLACEMENT, TRANSFEMORAL;  Surgeon: Burnell Blanks, MD;  Location: [facility];  Service: Open Heart Surgery;  Laterality: Bilateral;   ULTRASOUND GUIDANCE FOR VASCULAR ACCESS Bilateral 10/29/2020   Procedure: ULTRASOUND GUIDANCE FOR VASCULAR ACCESS;  Surgeon: Burnell Blanks, MD;  Location: [facility];   Service: Open Heart Surgery;  Laterality: Bilateral;   Family History  Problem Relation Age of Onset   Alzheimer's disease Father    Hypertension Father    Stroke Father    Hyperlipidemia Father    Prostate cancer Brother    Diabetes Brother    Alcohol abuse Brother    CAD Brother    Coronary artery disease Maternal Uncle    Schizophrenia Paternal Uncle    Emphysema Mother    Alcohol abuse Mother    Stroke Maternal Grandmother    Stroke Maternal Grandfather    Diabetes Maternal Grandfather    Stroke Paternal Grandmother    Stroke Paternal Grandfather    Social History   Socioeconomic History   Marital status: Married    Spouse name: Not on file   Number of children: 2   Years of education: Not on file   Highest education level: Not on file  Occupational History   Occupation: Retired-Worked for the [employer]  Tobacco Use  Smoking status: Never   Smokeless tobacco: Never  Substance and Sexual Activity   Alcohol use: Not Currently    Alcohol/week: 0.0 standard drinks    Comment: occasional   Drug use: No   Sexual activity: Not on file  Other Topics Concern   Not on file  Social History Narrative   Caffeine: 3 cups coffee/day   Lives with wife and daughter, son in college, 2 cats and 1 dog   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  Keeps record of caloric intake   Social Determinants of Health   Financial Resource Strain: Low Risk    Difficulty of Paying Living Expenses: Not hard at all  Food Insecurity: No Food Insecurity   Worried About RCharity fundraiserin the Last Year: Never true   Ran Out of Food in the Last Year: Never true  Transportation Needs: No Transportation Needs   Lack of Transportation (Medical): No   Lack of Transportation (Non-Medical): No  Physical Activity: Sufficiently Active   Days of Exercise per Week: 7 days   Minutes  of Exercise per Session: 60 min  Stress: No Stress Concern Present   Feeling of Stress : Not at all  Social Connections: Not on file    Tobacco Counseling Counseling given: Not Answered   Clinical Intake:  Pre-visit preparation completed: Yes  Pain : No/denies pain     Nutritional Risks: None Diabetes: Yes CBG done?: No Did pt. bring in CBG monitor from home?: No  How often do you need to have someone help you when you read instructions, pamphlets, or other written materials from your doctor or pharmacy?: 1 - Never  Diabetic: Yes Nutrition Risk Assessment:  Has the patient had any N/V/D within the last 2 months?  No  Does the patient have any non-healing wounds?  No  Has the patient had any unintentional weight loss or weight gain?  No   Diabetes:  Is the patient diabetic?  Yes  If diabetic, was a CBG obtained today?  No  telephone visit  Did the patient bring in their glucometer from home?  No  telephone visit  How often do you monitor your CBG's? daily.   Financial Strains and Diabetes Management:  Are you having any financial strains with the device, your supplies or your medication? No .  Does the patient want to be seen by Chronic Care Management for management of their diabetes?  No  Would the patient like to be referred to a Nutritionist or for Diabetic Management?  No   Diabetic Exams:  Diabetic Eye Exam: Completed 05/24/2020 Diabetic Foot Exam: Completed 11/14/2020   Interpreter Needed?: No  Information entered by :: CJohnson, RN   Activities of Daily Living In your present state of health, do you have any difficulty performing the following activities: 12/25/2020 10/28/2020  Hearing? N Y  Vision? N N  Difficulty concentrating or making decisions? N N  Walking or climbing stairs? N N  Dressing or bathing? N N  Doing errands, shopping? N N  Preparing Food and eating ? N -  Using the Toilet? N -  In the past six months, have you accidently leaked  urine? N -  Do you have problems with loss of bowel control? N -  Managing your Medications? N -  Managing your Finances? N -  Housekeeping or managing your Housekeeping? N -  Some  recent data might be hidden    Patient Care Team: Ria Bush, MD as PCP - General (Family Medicine) Elouise Munroe, MD as PCP - Cardiology (Cardiology)  Indicate any recent Medical Services you may have received from other than [facility] providers in the past year (date may be approximate).     Assessment:   This is a routine wellness examination for Kiyaan.  Hearing/Vision screen Vision Screening - Comments:: Patient gets annual eye exams   Dietary issues and exercise activities discussed: Current Exercise Habits: Home exercise routine, Type of exercise: walking, Time (Minutes): 60, Frequency (Times/Week): 7, Weekly Exercise (Minutes/Week): 420, Intensity: Moderate, Exercise limited by: None identified   Goals Addressed             This Visit's Progress    Patient Stated       12/25/2020, I will continue to walk daily for 5 miles.        Depression Screen PHQ 2/9 Scores 12/25/2020 11/14/2020 12/25/2019 12/21/2018 11/23/2017 02/18/2017 10/10/2013  PHQ - 2 Score 0 0 0 0 0 0 0  PHQ- 9 Score 0 - - - - - -    Fall Risk Fall Risk  12/25/2020 11/14/2020 12/25/2019 12/21/2018 02/18/2017  Falls in the past year? 0 0 0 0 No  Number falls in past yr: 0 0 - - -  Injury with Fall? 0 0 - - -  Risk for fall due to : Medication side effect - - - -  Follow up Falls evaluation completed;Falls prevention discussed Falls evaluation completed - - -    FALL RISK PREVENTION PERTAINING TO THE HOME:  Any stairs in or around the home? Yes  If so, are there any without handrails? No  Home free of loose throw rugs in walkways, pet beds, electrical cords, etc? Yes  Adequate lighting in your home to reduce risk of falls? Yes   ASSISTIVE DEVICES UTILIZED TO PREVENT FALLS:  Life alert? No  Use of a cane, walker or w/c?  No  Grab bars in the bathroom? No  Shower chair or bench in shower? No  Elevated toilet seat or a handicapped toilet? No   TIMED UP AND GO:  Was the test performed?  N/A telephone visit .    Cognitive Function: MMSE - Mini Mental State Exam 12/25/2020  Not completed: Refused       Mini Cog  Mini-Cog screen was not completed. Wanted to skip this. Maximum score is 22. A value of 0 denotes this part of the MMSE was not completed or the patient failed this part of the Mini-Cog screening.  Immunizations Immunization History  Administered Date(s) Administered   Fluad Quad(high Dose 65+) 03/27/2019, 03/13/2020   Influenza Split 04/26/2012   Influenza Whole 04/22/2010, 03/15/2013   Influenza,inj,Quad PF,6+ Mos 03/26/2014, 05/20/2015, 02/18/2016, 03/15/2017, 03/24/2018   PFIZER(Purple Top)SARS-COV-2 Vaccination 06/21/2019, 07/26/2019, 03/28/2020   Pneumococcal Conjugate-13 12/21/2018   Pneumococcal Polysaccharide-23 12/22/2012, 12/25/2019   Tdap 04/26/2012    TDAP status: Up to date  Flu Vaccine status: Up to date  Pneumococcal vaccine status: Up to date  Covid-19 vaccine status: Completed 3 vaccines  Qualifies for Shingles Vaccine? Yes   Zostavax completed No   Shingrix Completed?: No.    Education has been provided regarding the importance of this vaccine. Patient has been advised to call insurance company to determine out of pocket expense if they have not yet received this vaccine. Advised may also receive vaccine at local pharmacy or Health Dept. Verbalized acceptance and  understanding.  Screening Tests Health Maintenance  Topic Date Due   Zoster Vaccines- Shingrix (1 of 2) Never done   COVID-19 Vaccine (4 - Booster for Pfizer series) 07/29/2020   INFLUENZA VACCINE  01/13/2021   HEMOGLOBIN A1C  05/14/2021   OPHTHALMOLOGY EXAM  05/24/2021   FOOT EXAM  11/14/2021   TETANUS/TDAP  04/26/2022   COLONOSCOPY (Pts 45-66yr Insurance coverage will need to be confirmed)   04/11/2025   Hepatitis C Screening  Completed   PNA vac Low Risk Adult  Completed   HPV VACCINES  Aged Out    Health Maintenance  Health Maintenance Due  Topic Date Due   Zoster Vaccines- Shingrix (1 of 2) Never done   COVID-19 Vaccine (4 - Booster for Pfizer series) 07/29/2020    Colorectal cancer screening: Type of screening: Colonoscopy. Completed 04/12/2015. Repeat every 10 years  Lung Cancer Screening: (Low Dose CT Chest recommended if Age 67-80years, 30 pack-year currently smoking OR have quit w/in 15years.) does not qualify.  Additional Screening:  Hepatitis C Screening: does qualify; Completed 09/10/2015  Vision Screening: Recommended annual ophthalmology exams for early detection of glaucoma and other disorders of the eye. Is the patient up to date with their annual eye exam?  Yes  Who is the provider or what is the name of the office in which the patient attends annual eye exams? Dr. JMelissa Noon If pt is not established with a provider, would they like to be referred to a provider to establish care? No .   Dental Screening: Recommended annual dental exams for proper oral hygiene  Community Resource Referral / Chronic Care Management: CRR required this visit?  No   CCM required this visit?  No      Plan:     I have personally reviewed and noted the following in the patient's chart:   Medical and social history Use of alcohol, tobacco or illicit drugs  Current medications and supplements including opioid prescriptions. Patient is not currently taking opioid prescriptions. Functional ability and status Nutritional status Physical activity Advanced directives List of other physicians Hospitalizations, surgeries, and ER visits in previous 12 months Vitals Screenings to include cognitive, depression, and falls Referrals and appointments  In addition, I have reviewed and discussed with patient certain preventive protocols, quality metrics, and best practice  recommendations. A written personalized care plan for preventive services as well as general preventive health recommendations were provided to patient.   Due to this being a telephonic visit, the after visit summary with patients personalized plan was offered to patient via office or my-chart. Patient preferred to pick up at office at next visit or via mychart.   JAndrez Grime LPN   [id number]

## 2020-12-25 NOTE — Patient Instructions (Signed)
[name] , Thank you for taking time to come for your Medicare Wellness Visit. I appreciate your ongoing commitment to your health goals. Please review the following plan we discussed and let me know if I can assist you in the future.   Screening recommendations/referrals: Colonoscopy: Up to date, completed 04/12/2015, due 03/2025 Recommended yearly ophthalmology/optometry visit for glaucoma screening and checkup Recommended yearly dental visit for hygiene and checkup  Vaccinations: Influenza vaccine: Up to date, completed 03/13/2020, due 01/2021 Pneumococcal vaccine: Completed series Tdap vaccine: Up to date, completed 04/26/2012, due 04/2022 Shingles vaccine: due, check with your insurance regarding coverage if interested    Covid-19: completed 3 vaccines  Advanced directives: Please bring a copy of your POA (Power of Attorney) and/or Living Will to your next appointment.   Conditions/risks identified: diabetes, hypertension, hyperlipidemia   Next appointment: Follow up in one year for your annual wellness visit.   Preventive Care 65 Years and Older, Male Preventive care refers to lifestyle choices and visits with your health care provider that can promote health and wellness. What does preventive care include? A yearly physical exam. This is also called an annual well check. Dental exams once or twice a year. Routine eye exams. Ask your health care provider how often you should have your eyes checked. Personal lifestyle choices, including: Daily care of your teeth and gums. Regular physical activity. Eating a healthy diet. Avoiding tobacco and drug use. Limiting alcohol use. Practicing safe sex. Taking low doses of aspirin every day. Taking vitamin and mineral supplements as recommended by your health care provider. What happens during an annual well check? The services and screenings done by your health care provider during your annual well check will depend on your age,  overall health, lifestyle risk factors, and family history of disease. Counseling  Your health care provider may ask you questions about your: Alcohol use. Tobacco use. Drug use. Emotional well-being. Home and relationship well-being. Sexual activity. Eating habits. History of falls. Memory and ability to understand (cognition). Work and work Statistician. Screening  You may have the following tests or measurements: Height, weight, and BMI. Blood pressure. Lipid and cholesterol levels. These may be checked every 5 years, or more frequently if you are over 44 years old. Skin check. Lung cancer screening. You may have this screening every year starting at age 28 if you have a 30-pack-year history of smoking and currently smoke or have quit within the past 15 years. Fecal occult blood test (FOBT) of the stool. You may have this test every year starting at age 11. Flexible sigmoidoscopy or colonoscopy. You may have a sigmoidoscopy every 5 years or a colonoscopy every 10 years starting at age 38. Prostate cancer screening. Recommendations will vary depending on your family history and other risks. Hepatitis C blood test. Hepatitis B blood test. Sexually transmitted disease (STD) testing. Diabetes screening. This is done by checking your blood sugar (glucose) after you have not eaten for a while (fasting). You may have this done every 1-3 years. Abdominal aortic aneurysm (AAA) screening. You may need this if you are a current or former smoker. Osteoporosis. You may be screened starting at age 67 if you are at high risk. Talk with your health care provider about your test results, treatment options, and if necessary, the need for more tests. Vaccines  Your health care provider may recommend certain vaccines, such as: Influenza vaccine. This is recommended every year. Tetanus, diphtheria, and acellular pertussis (Tdap, Td) vaccine. You may need  a Td booster every 10 years. Zoster vaccine.  You may need this after age 20. Pneumococcal 13-valent conjugate (PCV13) vaccine. One dose is recommended after age 29. Pneumococcal polysaccharide (PPSV23) vaccine. One dose is recommended after age 70. Talk to your health care provider about which screenings and vaccines you need and how often you need them. This information is not intended to replace advice given to you by your health care provider. Make sure you discuss any questions you have with your health care provider. Document Released: 06/28/2015 Document Revised: 02/19/2016 Document Reviewed: 04/02/2015 Elsevier Interactive Patient Education  2017 [employer] Prevention in the Home Falls can cause injuries. They can happen to people of all ages. There are many things you can do to make your home safe and to help prevent falls. What can I do on the outside of my home? Regularly fix the edges of walkways and driveways and fix any cracks. Remove anything that might make you trip as you walk through a door, such as a raised step or threshold. Trim any bushes or trees on the path to your home. Use bright outdoor lighting. Clear any walking paths of anything that might make someone trip, such as rocks or tools. Regularly check to see if handrails are loose or broken. Make sure that both sides of any steps have handrails. Any raised decks and porches should have guardrails on the edges. Have any leaves, snow, or ice cleared regularly. Use sand or salt on walking paths during winter. Clean up any spills in your garage right away. This includes oil or grease spills. What can I do in the bathroom? Use night lights. Install grab bars by the toilet and in the tub and shower. Do not use towel bars as grab bars. Use non-skid mats or decals in the tub or shower. If you need to sit down in the shower, use a plastic, non-slip stool. Keep the floor dry. Clean up any water that spills on the floor as soon as it happens. Remove soap  buildup in the tub or shower regularly. Attach bath mats securely with double-sided non-slip rug tape. Do not have throw rugs and other things on the floor that can make you trip. What can I do in the bedroom? Use night lights. Make sure that you have a light by your bed that is easy to reach. Do not use any sheets or blankets that are too big for your bed. They should not hang down onto the floor. Have a firm chair that has side arms. You can use this for support while you get dressed. Do not have throw rugs and other things on the floor that can make you trip. What can I do in the kitchen? Clean up any spills right away. Avoid walking on wet floors. Keep items that you use a lot in easy-to-reach places. If you need to reach something above you, use a strong step stool that has a grab bar. Keep electrical cords out of the way. Do not use floor polish or wax that makes floors slippery. If you must use wax, use non-skid floor wax. Do not have throw rugs and other things on the floor that can make you trip. What can I do with my stairs? Do not leave any items on the stairs. Make sure that there are handrails on both sides of the stairs and use them. Fix handrails that are broken or loose. Make sure that handrails are as long as the stairways. Check  any carpeting to make sure that it is firmly attached to the stairs. Fix any carpet that is loose or worn. Avoid having throw rugs at the top or bottom of the stairs. If you do have throw rugs, attach them to the floor with carpet tape. Make sure that you have a light switch at the top of the stairs and the bottom of the stairs. If you do not have them, ask someone to add them for you. What else can I do to help prevent falls? Wear shoes that: Do not have high heels. Have rubber bottoms. Are comfortable and fit you well. Are closed at the toe. Do not wear sandals. If you use a stepladder: Make sure that it is fully opened. Do not climb a closed  stepladder. Make sure that both sides of the stepladder are locked into place. Ask someone to hold it for you, if possible. Clearly mark and make sure that you can see: Any grab bars or handrails. First and last steps. Where the edge of each step is. Use tools that help you move around (mobility aids) if they are needed. These include: Canes. Walkers. Scooters. Crutches. Turn on the lights when you go into a dark area. Replace any light bulbs as soon as they burn out. Set up your furniture so you have a clear path. Avoid moving your furniture around. If any of your floors are uneven, fix them. If there are any pets around you, be aware of where they are. Review your medicines with your doctor. Some medicines can make you feel dizzy. This can increase your chance of falling. Ask your doctor what other things that you can do to help prevent falls. This information is not intended to replace advice given to you by your health care provider. Make sure you discuss any questions you have with your health care provider. Document Released: 03/28/2009 Document Revised: 11/07/2015 Document Reviewed: 07/06/2014 Elsevier Interactive Patient Education  2017 [employer].

## 2020-12-26 DIAGNOSIS — B369 Superficial mycosis, unspecified: Secondary | ICD-10-CM | POA: Diagnosis not present

## 2020-12-26 DIAGNOSIS — H6242 Otitis externa in other diseases classified elsewhere, left ear: Secondary | ICD-10-CM | POA: Diagnosis not present

## 2020-12-28 ENCOUNTER — Other Ambulatory Visit: Payer: Self-pay | Admitting: Endocrinology

## 2020-12-29 LAB — FRUCTOSAMINE: Fructosamine: 272 umol/L (ref 205–285)

## 2020-12-30 ENCOUNTER — Other Ambulatory Visit: Payer: Self-pay

## 2020-12-30 ENCOUNTER — Ambulatory Visit (INDEPENDENT_AMBULATORY_CARE_PROVIDER_SITE_OTHER): Payer: Medicare Other | Admitting: Family Medicine

## 2020-12-30 ENCOUNTER — Encounter: Payer: Self-pay | Admitting: Family Medicine

## 2020-12-30 VITALS — BP 128/70 | HR 83 | Temp 97.7°F | Ht 70.0 in | Wt 216.0 lb

## 2020-12-30 DIAGNOSIS — Z952 Presence of prosthetic heart valve: Secondary | ICD-10-CM | POA: Diagnosis not present

## 2020-12-30 DIAGNOSIS — E66811 Obesity, class 1: Secondary | ICD-10-CM

## 2020-12-30 DIAGNOSIS — K769 Liver disease, unspecified: Secondary | ICD-10-CM | POA: Diagnosis not present

## 2020-12-30 DIAGNOSIS — I350 Nonrheumatic aortic (valve) stenosis: Secondary | ICD-10-CM

## 2020-12-30 DIAGNOSIS — E669 Obesity, unspecified: Secondary | ICD-10-CM | POA: Diagnosis not present

## 2020-12-30 DIAGNOSIS — E118 Type 2 diabetes mellitus with unspecified complications: Secondary | ICD-10-CM | POA: Diagnosis not present

## 2020-12-30 DIAGNOSIS — H6240 Otitis externa in other diseases classified elsewhere, unspecified ear: Secondary | ICD-10-CM

## 2020-12-30 DIAGNOSIS — E1169 Type 2 diabetes mellitus with other specified complication: Secondary | ICD-10-CM

## 2020-12-30 DIAGNOSIS — F3342 Major depressive disorder, recurrent, in full remission: Secondary | ICD-10-CM | POA: Diagnosis not present

## 2020-12-30 DIAGNOSIS — R972 Elevated prostate specific antigen [PSA]: Secondary | ICD-10-CM | POA: Diagnosis not present

## 2020-12-30 DIAGNOSIS — E785 Hyperlipidemia, unspecified: Secondary | ICD-10-CM

## 2020-12-30 DIAGNOSIS — K2100 Gastro-esophageal reflux disease with esophagitis, without bleeding: Secondary | ICD-10-CM | POA: Diagnosis not present

## 2020-12-30 DIAGNOSIS — B369 Superficial mycosis, unspecified: Secondary | ICD-10-CM

## 2020-12-30 DIAGNOSIS — K760 Fatty (change of) liver, not elsewhere classified: Secondary | ICD-10-CM | POA: Diagnosis not present

## 2020-12-30 DIAGNOSIS — I10 Essential (primary) hypertension: Secondary | ICD-10-CM | POA: Diagnosis not present

## 2020-12-30 DIAGNOSIS — Z1211 Encounter for screening for malignant neoplasm of colon: Secondary | ICD-10-CM

## 2020-12-30 DIAGNOSIS — Z7189 Other specified counseling: Secondary | ICD-10-CM

## 2020-12-30 DIAGNOSIS — N529 Male erectile dysfunction, unspecified: Secondary | ICD-10-CM

## 2020-12-30 MED ORDER — SILDENAFIL CITRATE 20 MG PO TABS: 100.0000 mg | ORAL_TABLET | Freq: Every day | ORAL | 6 refills | Status: DC | PRN

## 2020-12-30 NOTE — Patient Instructions (Addendum)
Pass by lab to pick up stool kit.  Bring [place] a copy of your advanced directive You are doing well today  Return as needed or in 6 months for follow up visit   Health Maintenance After Age 67 After age 79, you are at a higher risk for certain long-term diseases and infections as well as injuries from falls. Falls are a major cause of broken bones and head injuries in people who are older than age 13. Getting regular preventive care can help to keep you healthy and well. Preventive care includes getting regular testing and making lifestyle changes as recommended by your health care provider. Talk with your health care provider about: Which screenings and tests you should have. A screening is a test that checks for a disease when you have no symptoms. A diet and exercise plan that is right for you. What should I know about screenings and tests to prevent falls? Screening and testing are the best ways to find a health problem early. Early diagnosis and treatment give you the best chance of managing medical conditions that are common after age 5. Certain conditions and lifestyle choices may make you more likely to have a fall. Your health care provider may recommend: Regular vision checks. Poor vision and conditions such as cataracts can make you more likely to have a fall. If you wear glasses, make sure to get your prescription updated if your vision changes. Medicine review. Work with your health care provider to regularly review all of the medicines you are taking, including over-the-counter medicines. Ask your health care provider about any side effects that may make you more likely to have a fall. Tell your health care provider if any medicines that you take make you feel dizzy or sleepy. Osteoporosis screening. Osteoporosis is a condition that causes the bones to get weaker. This can make the bones weak and cause them to break more easily. Blood pressure screening. Blood pressure changes and medicines  to control blood pressure can make you feel dizzy. Strength and balance checks. Your health care provider may recommend certain tests to check your strength and balance while standing, walking, or changing positions. Foot health exam. Foot pain and numbness, as well as not wearing proper footwear, can make you more likely to have a fall. Depression screening. You may be more likely to have a fall if you have a fear of falling, feel emotionally low, or feel unable to do activities that you used to do. Alcohol use screening. Using too much alcohol can affect your balance and may make you more likely to have a fall. What actions can I take to lower my risk of falls? General instructions Talk with your health care provider about your risks for falling. Tell your health care provider if: You fall. Be sure to tell your health care provider about all falls, even ones that seem minor. You feel dizzy, sleepy, or off-balance. Take over-the-counter and prescription medicines only as told by your health care provider. These include any supplements. Eat a healthy diet and maintain a healthy weight. A healthy diet includes low-fat dairy products, low-fat (lean) meats, and fiber from whole grains, beans, and lots of fruits and vegetables. Home safety Remove any tripping hazards, such as rugs, cords, and clutter. Install safety equipment such as grab bars in bathrooms and safety rails on stairs. Keep rooms and walkways well-lit. Activity  Follow a regular exercise program to stay fit. This will help you maintain your balance. Ask your health  care provider what types of exercise are appropriate for you. If you need a cane or walker, use it as recommended by your health care provider. Wear supportive shoes that have nonskid soles.  Lifestyle Do not drink alcohol if your health care provider tells you not to drink. If you drink alcohol, limit how much you have: 0-1 drink a day for women. 0-2 drinks a day for  men. Be aware of how much alcohol is in your drink. In the [place] one drink equals one typical bottle of beer (12 oz), one-half glass of wine (5 oz), or one shot of hard liquor (1 oz). Do not use any products that contain nicotine or tobacco, such as cigarettes and e-cigarettes. If you need help quitting, ask your health care provider. Summary Having a healthy lifestyle and getting preventive care can help to protect your health and wellness after age 18. Screening and testing are the best way to find a health problem early and help you avoid having a fall. Early diagnosis and treatment give you the best chance for managing medical conditions that are more common for people who are older than age 17. Falls are a major cause of broken bones and head injuries in people who are older than age 39. Take precautions to prevent a fall at home. Work with your health care provider to learn what changes you can make to improve your health and wellness and to prevent falls. This information is not intended to replace advice given to you by your health care provider. Make sure you discuss any questions you have with your healthcare provider. Document Revised: 05/17/2020 Document Reviewed: 05/17/2020 Elsevier Patient Education  2022 [employer].

## 2020-12-30 NOTE — Progress Notes (Signed)
Patient ID: Evan [name], male    DOB: 1956/04/28, 65 y.o.   MRN: [id number]  This visit was conducted in person.  BP 128/70   Pulse 83   Temp 97.7 F (36.5 C) (Temporal)   Ht _0  (1.778 m)   Wt 216 lb (98 kg)   SpO2 97%   BMI 30.99 kg/m    CC: AMW Subjective:   HPI: Evan [name] is a 65 y.o. male presenting on 12/30/2020 for Annual Exam (Prt 2.  Requests written rx for sildenafil. )   Saw health advisor last week for medicare wellness visit. Note reviewed.    No results found.  Flowsheet Row Clinical Support from 12/25/2020 in [facility]  PHQ-2 Total Score 0       Fall Risk  12/25/2020 11/14/2020 12/25/2019 12/21/2018 02/18/2017  Falls in the past year? 0 0 0 0 No  Number falls in past yr: 0 0 - - -  Injury with Fall? 0 0 - - -  Risk for fall due to : Medication side effect - - - -  Follow up Falls evaluation completed;Falls prevention discussed Falls evaluation completed - - -    Recent chronic L mycotic otitis externa being treated by ENT.   Recent heart surgery (TAVR) 10/2020. Saw cards last month, released for 6 months.   COVID illness 05/2020 - symptoms fully resolved   Preventative: COLONOSCOPY Date: 03/2015 2 polyps, rpt 5 yrs [name])  Desires to postpone colonoscopy for now. Will do iFOB this year.  ESOPHAGOGASTRODUODENOSCOPY Date: 03/2015 mild chronic gastritis, esophagitis - on daily PPI  Prostate cancer screening - Brother with prostate cancer age 36. Nocturia x1.  Lung cancer screening - not eligible  Flu shot yearly COVID vaccine - [place] 06/2019, 07/2019, booster 03/2020  Tdap 04/2012 Pneumovax23 12/2012, 12/2019. Prevnar13 12/2018 Shingrix - discussed. Has had shingles x3.  Advanced planning discussion - has this at home, currently updating. HCPOA would be wife then oldest child. Asked to bring [place] copy. Seat belt use discussed.  Sunscreen use discussed. No changing moles on skin.  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 Keeps record of caloric intake     Relevant past medical, surgical, family and social history reviewed and updated as indicated. Interim medical history since our last visit reviewed. Allergies and medications reviewed and updated. Outpatient Medications Prior to Visit  Medication Sig Dispense Refill   amLODipine (NORVASC) 5 MG tablet Take 1 tablet (5 mg total) by mouth daily. 90 tablet 3   aspirin EC 81 MG tablet Take 81 mg by mouth every evening. Swallow whole.     Blood Glucose Monitoring Suppl (ONETOUCH VERIO FLEX SYSTEM) w/Device KIT 1 kit by Does not apply route daily. Use to check blood sugars 2 times daily 1 kit 2   cephALEXin (KEFLEX) 500 MG capsule Take 4 capsules (2,000 mg) one hour prior to all dental visits. 12 capsule 12   Cholecalciferol (VITAMIN D3) 50 MCG (2000 UT) TABS Take 2,000 Units by mouth daily.     ciprofloxacin-dexamethasone (CIPRODEX) OTIC suspension SMARTSIG:4 Drop(s) Left Ear Twice Daily     clopidogrel (PLAVIX) 75 MG tablet Take 1  tablet (75 mg total) by mouth daily with breakfast. 90 tablet 1   Cyanocobalamin (VITAMIN B-12) 2500 MCG SUBL Take 2,500 mcg by mouth daily.     fluticasone (FLONASE) 50 MCG/ACT nasal spray Place 1 spray into both nostrils daily as needed for allergies or rhinitis.     losartan (COZAAR) 100 MG tablet Take 50 mg by mouth daily.     Magnesium 250 MG TABS Take 250 mg by mouth every evening.     metFORMIN (GLUCOPHAGE) 1000 MG tablet TAKE 1 TABLET (1,000 MG TOTAL) BY MOUTH 2 TIMES DAILY WITH A MEAL. (Patient taking differently: Take 1,000 mg by mouth every evening.) 180 tablet 1   ONETOUCH VERIO test strip USE TO CHECK BLOOD SUGARS 2  TIMES DAILY 200 strip 3   pantoprazole (PROTONIX) 40 MG tablet Take 1 tablet (40 mg total) by mouth daily. 90 tablet 1   rosuvastatin (CRESTOR) 5 MG tablet TAKE 1 TABLET BY MOUTH EVERY DAY 90 tablet 0   Semaglutide,0.25 or 0.5MG/DOS, (OZEMPIC, 0.25 OR 0.5 MG/DOSE,) 2 MG/1.5ML SOPN Inject 0.5 mg into the skin once a week. 1.5 mL 2   TURMERIC PO Take 1,800 mg by mouth every evening.     Vitamin D-Vitamin K (K2 PLUS D3 PO) Take 1 tablet by mouth every evening.     Zinc 50 MG TABS Take 50 mg by mouth every evening.     dapagliflozin propanediol (FARXIGA) 10 MG TABS tablet Take 5 mg by mouth daily.     sildenafil (REVATIO) 20 MG tablet Take 2-5 tablets (40-100 mg total) by mouth daily as needed (relations). 30 tablet 3   No facility-administered medications prior to visit.     Per HPI unless specifically indicated in ROS section below Review of Systems  Gastrointestinal:  Positive for abdominal pain (RUQ discomfort, ongoing, mild).   Objective:  BP 128/70   Pulse 83   Temp 97.7 F (36.5 C) (Temporal)   Ht _0  (1.778 m)   Wt 216 lb (98 kg)   SpO2 97%   BMI 30.99 kg/m   Wt Readings from Last 3 Encounters:  12/30/20 216 lb (98 kg)  11/25/20 217 lb 6.4 oz (98.6 kg)  11/19/20 220 lb 3 oz (99.9 kg)      Physical Exam Vitals and nursing note reviewed.  Constitutional:      General: He is not in acute distress.    Appearance: Normal appearance. He is well-developed. He is not ill-appearing.  HENT:     Head: Normocephalic and atraumatic.     Right Ear: Hearing, tympanic membrane, ear canal and external ear normal.     Left Ear: Hearing, tympanic membrane, ear canal and external ear normal.  Eyes:     General: No scleral icterus.    Extraocular Movements: Extraocular movements intact.     Conjunctiva/sclera: Conjunctivae normal.     Pupils: Pupils are equal, round, and reactive to light.  Neck:     Thyroid: No thyroid mass or thyromegaly.  Cardiovascular:     Rate and Rhythm:  Normal rate and regular rhythm.     Pulses: Normal pulses.          Radial pulses are 2+ on the right side and 2+ on the left side.     Heart sounds: Murmur (3/6 systolic USB) heard.  Pulmonary:     Effort: Pulmonary effort is normal. No respiratory distress.     Breath sounds: Normal breath sounds. No wheezing, rhonchi or rales.  Abdominal:  General: Bowel sounds are normal. There is no distension.     Palpations: Abdomen is soft. There is no mass.     Tenderness: There is no abdominal tenderness. There is no guarding or rebound.     Hernia: No hernia is present.  Musculoskeletal:        General: Normal range of motion.     Cervical back: Normal range of motion and neck supple.     Right lower leg: No edema.     Left lower leg: No edema.  Lymphadenopathy:     Cervical: No cervical adenopathy.  Skin:    General: Skin is warm and dry.     Findings: No rash.  Neurological:     General: No focal deficit present.     Mental Status: He is alert and oriented to person, place, and time.  Psychiatric:        Mood and Affect: Mood normal.        Behavior: Behavior normal.        Thought Content: Thought content normal.        Judgment: Judgment normal.      Results for orders placed or performed in visit on 12/25/20  Fructosamine  Result Value Ref Range   Fructosamine 272 205 - 285 umol/L  PSA  Result Value Ref Range   PSA 0.58 0.10 - 4.00 ng/mL  Comprehensive metabolic panel  Result Value Ref Range   Sodium 133 (L) 135 - 145 mEq/L   Potassium 4.4 3.5 - 5.1 mEq/L   Chloride 95 (L) 96 - 112 mEq/L   CO2 27 19 - 32 mEq/L   Glucose, Bld 111 (H) 70 - 99 mg/dL   BUN 14 6 - 23 mg/dL   Creatinine, Ser 0.87 0.40 - 1.50 mg/dL   Total Bilirubin 0.5 0.2 - 1.2 mg/dL   Alkaline Phosphatase 46 39 - 117 U/L   AST 27 0 - 37 U/L   ALT 29 0 - 53 U/L   Total Protein 7.3 6.0 - 8.3 g/dL   Albumin 4.6 3.5 - 5.2 g/dL   GFR 89.27 >60.00 mL/min   Calcium 9.8 8.4 - 10.5 mg/dL  Lipid panel   Result Value Ref Range   Cholesterol 186 0 - 200 mg/dL   Triglycerides 59.0 0.0 - 149.0 mg/dL   HDL 91.40 >39.00 mg/dL   VLDL 11.8 0.0 - 40.0 mg/dL   LDL Cholesterol 82 0 - 99 mg/dL   Total CHOL/HDL Ratio 2    NonHDL 94.17     Assessment & Plan:  This visit occurred during the SARS-CoV-2 public health emergency.  Safety protocols were in place, including screening questions prior to the visit, additional usage of staff PPE, and extensive cleaning of exam room while observing appropriate contact time as indicated for disinfecting solutions.   Problem List Items Addressed This Visit     Advanced directives, counseling/discussion - Primary (Chronic)    Advanced planning discussion - has this at home, currently updating. HCPOA would be wife then oldest child. Asked to bring [place] copy.       Controlled diabetes mellitus type 2 with complications (HCC)    Chronic, stable period followed by endocrinology Dwyane Dee).        Hyperlipidemia associated with type 2 diabetes mellitus (HCC)    Chronic, stable on crestor - continue. LDL did worsen with drop from 1m to 543mdose. The 10-year ASCVD risk score (GMikey BussingC Jr., et al., 2013) is: 21.9%   Values used  to calculate the score:     Age: 25 years     Sex: Male     Is Non-Hispanic African American: No     Diabetic: Yes     Tobacco smoker: No     Systolic Blood Pressure: 568 mmHg     Is BP treated: Yes     HDL Cholesterol: 91.4 mg/dL     Total Cholesterol: 186 mg/dL        MDD (major depressive disorder), recurrent, in full remission ([place])    Stable period off antidepressant.        Essential hypertension    Chronic, stable. Continue current regimen (amlodipine, losartan)       Relevant Medications   sildenafil (REVATIO) 20 MG tablet   ERECTILE DYSFUNCTION, ORGANIC    Sildenafil 28m refilled - using 1015mdose       GERD (gastroesophageal reflux disease)    Continues PPI daily.        Obesity, Class I, BMI 30-34.9     Weight remains stable.        Severe aortic stenosis   Relevant Medications   sildenafil (REVATIO) 20 MG tablet   Rising PSA level    PSA has returned to normal range on recent testing.        NAFLD (nonalcoholic fatty liver disease)   S/P TAVR (transcatheter aortic valve replacement)   Liver lesion, right lobe    Discussed presumed nodular hyperplasia on recent MRI (09/2020). Given ongoing chronic RUQ abd discomfort, reasonable to rpt MRI in 1 year as per radiology recommendation in report.        Chronic mycotic otitis externa    Being treated weekly by ENT - appreciate their care.       Other Visit Diagnoses     Special screening for malignant neoplasms, colon       Relevant Orders   Fecal occult blood, imunochemical        Meds ordered this encounter  Medications   sildenafil (REVATIO) 20 MG tablet    Sig: Take 5 tablets (100 mg total) by mouth daily as needed (relations).    Dispense:  30 tablet    Refill:  6    Orders Placed This Encounter  Procedures   Fecal occult blood, imunochemical    Standing Status:   Future    Standing Expiration Date:   12/30/2021     Patient instructions: Pass by lab to pick up stool kit.  Bring [place] copy of your advanced directive You are doing well today  Return as needed or in 6 months for follow up visit   Follow up plan: Return in about 6 months (around 07/02/2021) for follow up visit.  JaRia BushMD

## 2020-12-30 NOTE — Telephone Encounter (Signed)
Is this okay to refill ,please advise

## 2020-12-30 NOTE — Assessment & Plan Note (Signed)
Advanced planning discussion - has this at home, currently updating. HCPOA would be wife then oldest child. Asked to bring [place] copy.

## 2020-12-31 NOTE — Assessment & Plan Note (Signed)
Chronic, stable period followed by endocrinology Dwyane Dee).

## 2020-12-31 NOTE — Assessment & Plan Note (Signed)
Being treated weekly by ENT - appreciate their care.

## 2020-12-31 NOTE — Assessment & Plan Note (Signed)
Sildenafil 20mg  refilled - using 100mg  dose

## 2020-12-31 NOTE — Assessment & Plan Note (Signed)
Chronic, stable on crestor - continue. LDL did worsen with drop from 10mg  to 5mg  dose. The 10-year ASCVD risk score Evan [name]., et al., 2013) is: 21.9%   Values used to calculate the score:     Age: 65 years     Sex: Male     Is Non-Hispanic African American: No     Diabetic: Yes     Tobacco smoker: No     Systolic Blood Pressure: 854 mmHg     Is BP treated: Yes     HDL Cholesterol: 91.4 mg/dL     Total Cholesterol: 186 mg/dL

## 2020-12-31 NOTE — Assessment & Plan Note (Signed)
Chronic, stable. Continue current regimen (amlodipine, losartan)

## 2020-12-31 NOTE — Assessment & Plan Note (Signed)
Stable period off antidepressant 

## 2020-12-31 NOTE — Assessment & Plan Note (Signed)
Weight remains stable.

## 2020-12-31 NOTE — Assessment & Plan Note (Signed)
Continues PPI daily.

## 2020-12-31 NOTE — Assessment & Plan Note (Signed)
PSA has returned to normal range on recent testing.

## 2020-12-31 NOTE — Assessment & Plan Note (Signed)
Discussed presumed nodular hyperplasia on recent MRI (09/2020). Given ongoing chronic RUQ abd discomfort, reasonable to rpt MRI in 1 year as per radiology recommendation in report.

## 2021-01-01 ENCOUNTER — Other Ambulatory Visit (INDEPENDENT_AMBULATORY_CARE_PROVIDER_SITE_OTHER): Payer: Medicare Other

## 2021-01-01 ENCOUNTER — Other Ambulatory Visit: Payer: Self-pay

## 2021-01-01 DIAGNOSIS — Z1211 Encounter for screening for malignant neoplasm of colon: Secondary | ICD-10-CM

## 2021-01-01 DIAGNOSIS — E1169 Type 2 diabetes mellitus with other specified complication: Secondary | ICD-10-CM

## 2021-01-01 DIAGNOSIS — E669 Obesity, unspecified: Secondary | ICD-10-CM

## 2021-01-01 LAB — FECAL OCCULT BLOOD, GUAIAC: Fecal Occult Blood: NEGATIVE

## 2021-01-01 LAB — FECAL OCCULT BLOOD, IMMUNOCHEMICAL: Fecal Occult Bld: NEGATIVE

## 2021-01-01 MED ORDER — ONETOUCH VERIO VI STRP: ORAL_STRIP | 3 refills | Status: DC

## 2021-01-02 DIAGNOSIS — J3489 Other specified disorders of nose and nasal sinuses: Secondary | ICD-10-CM | POA: Diagnosis not present

## 2021-01-02 DIAGNOSIS — Z8669 Personal history of other diseases of the nervous system and sense organs: Secondary | ICD-10-CM | POA: Diagnosis not present

## 2021-01-02 DIAGNOSIS — H6522 Chronic serous otitis media, left ear: Secondary | ICD-10-CM | POA: Diagnosis not present

## 2021-01-02 DIAGNOSIS — H90A21 Sensorineural hearing loss, unilateral, right ear, with restricted hearing on the contralateral side: Secondary | ICD-10-CM | POA: Diagnosis not present

## 2021-01-02 DIAGNOSIS — H90A32 Mixed conductive and sensorineural hearing loss, unilateral, left ear with restricted hearing on the contralateral side: Secondary | ICD-10-CM | POA: Diagnosis not present

## 2021-01-06 ENCOUNTER — Encounter: Payer: Self-pay | Admitting: Family Medicine

## 2021-01-15 ENCOUNTER — Other Ambulatory Visit: Payer: Self-pay | Admitting: Endocrinology

## 2021-01-15 DIAGNOSIS — E1169 Type 2 diabetes mellitus with other specified complication: Secondary | ICD-10-CM

## 2021-01-17 ENCOUNTER — Other Ambulatory Visit: Payer: Self-pay | Admitting: Endocrinology

## 2021-01-17 DIAGNOSIS — E1169 Type 2 diabetes mellitus with other specified complication: Secondary | ICD-10-CM

## 2021-01-28 ENCOUNTER — Other Ambulatory Visit: Payer: Self-pay | Admitting: Physician Assistant

## 2021-01-29 ENCOUNTER — Other Ambulatory Visit: Payer: Self-pay | Admitting: Endocrinology

## 2021-01-29 MED ORDER — TRULICITY 0.75 MG/0.5ML ~~LOC~~ SOAJ: SUBCUTANEOUS | 0 refills | Status: DC

## 2021-02-14 DIAGNOSIS — H6522 Chronic serous otitis media, left ear: Secondary | ICD-10-CM | POA: Diagnosis not present

## 2021-02-25 DIAGNOSIS — Z4589 Encounter for adjustment and management of other implanted devices: Secondary | ICD-10-CM | POA: Diagnosis not present

## 2021-02-25 DIAGNOSIS — H9312 Tinnitus, left ear: Secondary | ICD-10-CM | POA: Diagnosis not present

## 2021-02-25 DIAGNOSIS — H90A32 Mixed conductive and sensorineural hearing loss, unilateral, left ear with restricted hearing on the contralateral side: Secondary | ICD-10-CM | POA: Diagnosis not present

## 2021-02-25 DIAGNOSIS — H90A21 Sensorineural hearing loss, unilateral, right ear, with restricted hearing on the contralateral side: Secondary | ICD-10-CM | POA: Diagnosis not present

## 2021-02-26 ENCOUNTER — Other Ambulatory Visit: Payer: Self-pay | Admitting: Endocrinology

## 2021-02-28 NOTE — Telephone Encounter (Signed)
Message sent to Dr Dwyane Dee. Not sure if we are refilling or putting patient back on Ozempic per previous message.

## 2021-02-28 NOTE — Telephone Encounter (Signed)
Patient is needing refill on Trulicity. Not sure if you want to keep him on his Rx or put him back on Ozempic. Please advise.

## 2021-03-01 ENCOUNTER — Other Ambulatory Visit: Payer: Self-pay

## 2021-03-01 DIAGNOSIS — E1169 Type 2 diabetes mellitus with other specified complication: Secondary | ICD-10-CM

## 2021-03-01 MED ORDER — OZEMPIC (1 MG/DOSE) 4 MG/3ML ~~LOC~~ SOPN: 1.0000 mg | PEN_INJECTOR | SUBCUTANEOUS | 3 refills | Status: DC

## 2021-03-01 NOTE — Telephone Encounter (Signed)
Rx sent to pharmacy   

## 2021-03-19 ENCOUNTER — Other Ambulatory Visit: Payer: Self-pay | Admitting: Family Medicine

## 2021-03-25 ENCOUNTER — Other Ambulatory Visit: Payer: Self-pay

## 2021-03-25 ENCOUNTER — Other Ambulatory Visit (INDEPENDENT_AMBULATORY_CARE_PROVIDER_SITE_OTHER): Payer: Medicare Other

## 2021-03-25 DIAGNOSIS — E1169 Type 2 diabetes mellitus with other specified complication: Secondary | ICD-10-CM | POA: Diagnosis not present

## 2021-03-25 DIAGNOSIS — E669 Obesity, unspecified: Secondary | ICD-10-CM

## 2021-03-25 LAB — BASIC METABOLIC PANEL
BUN: 14 mg/dL (ref 6–23)
CO2: 28 mEq/L (ref 19–32)
Calcium: 10.1 mg/dL (ref 8.4–10.5)
Chloride: 98 mEq/L (ref 96–112)
Creatinine, Ser: 0.86 mg/dL (ref 0.40–1.50)
GFR: 89.43 mL/min (ref >60.00)
Glucose, Bld: 119 mg/dL — ABNORMAL HIGH (ref 70–99)
Potassium: 4.4 mEq/L (ref 3.5–5.1)
Sodium: 135 mEq/L (ref 135–145)

## 2021-03-25 LAB — HEMOGLOBIN A1C: Hgb A1c MFr Bld: 6 % (ref 4.6–6.5)

## 2021-03-27 ENCOUNTER — Other Ambulatory Visit: Payer: Self-pay

## 2021-03-27 ENCOUNTER — Ambulatory Visit (INDEPENDENT_AMBULATORY_CARE_PROVIDER_SITE_OTHER): Payer: Medicare Other | Admitting: Endocrinology

## 2021-03-27 VITALS — BP 138/78 | HR 83 | Ht 71.0 in | Wt 227.0 lb

## 2021-03-27 DIAGNOSIS — Z23 Encounter for immunization: Secondary | ICD-10-CM | POA: Diagnosis not present

## 2021-03-27 DIAGNOSIS — E669 Obesity, unspecified: Secondary | ICD-10-CM

## 2021-03-27 DIAGNOSIS — E1169 Type 2 diabetes mellitus with other specified complication: Secondary | ICD-10-CM | POA: Diagnosis not present

## 2021-03-27 NOTE — Progress Notes (Signed)
Patient ID: Evan [name], male   DOB: 1956/03/29, 65 y.o.   MRN: [id number]          Reason for Appointment:Type 2 Diabetes and associated problems   History of Present Illness:          Date of diagnosis of type 2 diabetes mellitus: 2012        Background history:   Diagnosed incidentally with a high blood sugar Has been on metformin since diagnosis and subsequently Amaryl had been added He has had sporadic follow-up for the last few years His A1c had been below 7 to about 08/2015 Subsequently in 9/17 his A1c had gone up to 8%  Recent history:      Non-insulin hypoglycemic drugs : Farxiga 10 mg,  metformin 1 g at dinner, Ozempic 0.5 mg weekly   His A1c is again in the normal range at 6 compared to 5.9   Current management, blood sugar patterns and problems identified: He was told to stop his morning metformin on the last visit and he has done so Since he was wanting to reduce his medications his Ozempic was reduced back to 0.5 instead of 1 mg weekly Although his blood sugars appear to be still fairly well controlled and his weight has gone up significantly about 11 pounds  However he thinks he has not changed his diet and is usually eating small portions, low carbohydrate intake and only about 2 meals a day with usually skipping breakfast  He is also trying to walk up to 5 miles about 5 days a week  He did not bring his monitor for download  Although his blood sugars reportedly are in the low 100 range at home highest reading was 179  He says after meals blood sugars are under [place] regularly and no change in renal function  Meals: Usually skipping breakfast, dinner is usually a protein with vegetables or salad, low-fat intake       Side effects from medications have been: None  Glucose monitoring:  done 1 or less times a day         Glucometer: One Touch Verio  .      Blood Glucose readings by review of monitor as above Previous readings:   PRE-MEAL  Fasting Lunch Dinner Bedtime Overall  Glucose range:  122  95-123  126    Mean/median:   115   120   POST-MEAL PC Breakfast PC Lunch PC Dinner  Glucose range:    118, 141  Mean/median:       Self-care: The diet that the patient has been following is: tries to reduce high-fat and fried foods, Carbohydrates and avoiding drinks with sugar .       Dietician visit, most recent: 02/2017  Weight history: Highest previous weight 287 in 2019  Wt Readings from Last 3 Encounters:  03/27/21 227 lb (103 kg)  12/30/20 216 lb (98 kg)  11/25/20 217 lb 6.4 oz (98.6 kg)    Glycemic control:   Lab Results  Component Value Date   HGBA1C 6.0 03/25/2021   HGBA1C 5.9 11/12/2020   HGBA1C 6.1 (H) 10/28/2020   Lab Results  Component Value Date   MICROALBUR 0.7 07/11/2020   LDLCALC 82 12/25/2020   CREATININE 0.86 03/25/2021   Lab Results  Component Value Date   MICRALBCREAT 0.9 07/11/2020    Lab Results  Component Value Date   FRUCTOSAMINE 272 12/25/2020   FRUCTOSAMINE 224 12/19/2019   FRUCTOSAMINE 239 12/13/2017  Allergies as of 03/27/2021       Reactions   Penicillins    Respiratory distress as infant        Medication List        Accurate as of March 27, 2021  3:56 PM. If you have any questions, ask your nurse or doctor.          amLODipine 5 MG tablet Commonly known as: NORVASC Take 1 tablet (5 mg total) by mouth daily.   aspirin EC 81 MG tablet Take 81 mg by mouth every evening. Swallow whole.   cephALEXin 500 MG capsule Commonly known as: Keflex Take 4 capsules (2,000 mg) one hour prior to all dental visits.   ciprofloxacin-dexamethasone OTIC suspension Commonly known as: CIPRODEX SMARTSIG:4 Drop(s) Left Ear Twice Daily   clopidogrel 75 MG tablet Commonly known as: PLAVIX Take 1 tablet (75 mg total) by mouth daily with breakfast.   Farxiga 10 MG Tabs tablet Generic drug: dapagliflozin propanediol TAKE 1 TABLET (10 MG TOTAL) BY MOUTH EVERY  MORNING.   fluticasone 50 MCG/ACT nasal spray Commonly known as: FLONASE Place 1 spray into both nostrils daily as needed for allergies or rhinitis.   K2 PLUS D3 PO Take 1 tablet by mouth every evening.   losartan 100 MG tablet Commonly known as: COZAAR Take 50 mg by mouth daily.   Magnesium 250 MG Tabs Take 250 mg by mouth every evening.   metFORMIN 1000 MG tablet Commonly known as: GLUCOPHAGE TAKE 1 TABLET (1,000 MG TOTAL) BY MOUTH 2 TIMES DAILY WITH A MEAL. What changed: See the new instructions.   OneTouch Verio Flex System w/Device Kit 1 kit by Does not apply route daily. Use to check blood sugars 2 times daily   OneTouch Verio test strip Generic drug: glucose blood USE TO CHECK BLOOD SUGARS 2 TIMES DAILY   Ozempic (1 MG/DOSE) 4 MG/3ML Sopn Generic drug: Semaglutide (1 MG/DOSE) Inject 1 mg into the skin once a week.   pantoprazole 40 MG tablet Commonly known as: PROTONIX TAKE 1 TABLET BY MOUTH EVERY DAY   rosuvastatin 5 MG tablet Commonly known as: CRESTOR TAKE 1 TABLET BY MOUTH EVERY DAY   sildenafil 20 MG tablet Commonly known as: REVATIO Take 5 tablets (100 mg total) by mouth daily as needed (relations).   Trulicity 1.19 ER/7.4YC Sopn Generic drug: Dulaglutide Inject in the abdominal skin as directed once a week   TURMERIC PO Take 1,800 mg by mouth every evening.   Vitamin B-12 2500 MCG Subl Take 2,500 mcg by mouth daily.   Vitamin D3 50 MCG (2000 UT) Tabs Take 2,000 Units by mouth daily.   Zinc 50 MG Tabs Take 50 mg by mouth every evening.        Allergies:  Allergies  Allergen Reactions   Penicillins     Respiratory distress as infant    Past Medical History:  Diagnosis Date   Adrenal adenoma    Complication of anesthesia    difficulty to weak up   COVID-19 virus infection 05/2020   Depression    intolerant of cymbalta and wellbutrin   Diverticulosis of colon    Esophagitis 03/2015   by EGD   Gastritis 03/2015   by EGD -  mild, chronic   Hyperlipidemia    Hypertension    Left ventricular hypertrophy    Lichen simplex chronicus 05/2015   R scrotum [name])   Liver lesion    2022 CTs: There was an incidental finding of a hypervascular  lesion in the right lobe of the liver as well as left adrenal nodule. He underwent MRI of the abdomen showing the appearance of the liver favored focal nodular hyperplasia, as well as small left adrenal adenoma.   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 Angelena Form & Dr. Cyndia Bent    Severe aortic stenosis    T2DM (type 2 diabetes mellitus) ([place])    DSME 09/2013    Past Surgical History:  Procedure Laterality Date   COLONOSCOPY  2006   diverticulosis per pt   COLONOSCOPY  03/2015   2 polyps, rpt 5 yrs [name])   ESOPHAGOGASTRODUODENOSCOPY  03/2015   mild chronic gastritis, esophagitis    INTRAOPERATIVE TRANSTHORACIC ECHOCARDIOGRAM Left 10/29/2020   Procedure: INTRAOPERATIVE TRANSTHORACIC ECHOCARDIOGRAM;  Surgeon: Burnell Blanks, MD;  Location: [facility];  Service: Open Heart Surgery;  Laterality: Left;   LUMBAR LAMINECTOMY  1989   L5-S1   RIGHT/LEFT HEART CATH AND CORONARY ANGIOGRAPHY N/A 08/15/2020   Procedure: RIGHT/LEFT HEART CATH AND CORONARY ANGIOGRAPHY;  Surgeon: Burnell Blanks, MD;  Location: [facility];  Service: Cardiovascular;  Laterality: N/A;   TRANSCATHETER AORTIC VALVE REPLACEMENT, TRANSFEMORAL Bilateral 10/29/2020   Procedure: TRANSCATHETER AORTIC VALVE REPLACEMENT, TRANSFEMORAL;  Surgeon: Burnell Blanks, MD;  Location: [facility];  Service: Open Heart Surgery;  Laterality: Bilateral;   ULTRASOUND GUIDANCE FOR VASCULAR ACCESS Bilateral 10/29/2020   Procedure: ULTRASOUND GUIDANCE FOR VASCULAR ACCESS;  Surgeon: Burnell Blanks, MD;   Location: [facility];  Service: Open Heart Surgery;  Laterality: Bilateral;    Family History  Problem Relation Age of Onset   Alzheimer's disease Father    Hypertension Father    Stroke Father    Hyperlipidemia Father    Prostate cancer Brother    Diabetes Brother    Alcohol abuse Brother    CAD Brother    Coronary artery disease Maternal Uncle    Schizophrenia Paternal Uncle    Emphysema Mother    Alcohol abuse Mother    Stroke Maternal Grandmother    Stroke Maternal Grandfather    Diabetes Maternal Grandfather    Stroke Paternal Grandmother    Stroke Paternal Grandfather     Social History:  reports that he has never smoked. He has never used smokeless tobacco. He reports that he does not currently use alcohol. He reports that he does not use drugs.   Review of Systems  Lipid history: On Crestor 5 mg from his PCP and his LDL is consistently around 70     Lab Results  Component Value Date   CHOL 186 12/25/2020   HDL 91.40 12/25/2020   LDLCALC 82 12/25/2020   LDLDIRECT 99.0 05/20/2015   TRIG 59.0 12/25/2020   CHOLHDL 2 12/25/2020           Hypertension: On Losartan 100 mg, 1/2 tablet daily with usually good control  He is monitoring regularly at home  BP Readings from Last 3 Encounters:  03/27/21 138/78  12/30/20 128/70  11/25/20 134/78     Most recent foot exam: 11/2020    LABS:  Lab on 03/25/2021  Component Date Value Ref Range Status   Sodium 03/25/2021 135  135 - 145 mEq/L Final   Potassium 03/25/2021 4.4  3.5 - 5.1 mEq/L Final  Chloride 03/25/2021 98  96 - 112 mEq/L Final   CO2 03/25/2021 28  19 - 32 mEq/L Final   Glucose, Bld 03/25/2021 119 (A) 70 - 99 mg/dL Final   BUN 03/25/2021 14  6 - 23 mg/dL Final   Creatinine, Ser 03/25/2021 0.86  0.40 - 1.50 mg/dL Final   GFR 03/25/2021 89.43  >60.00 mL/min Final   Calculated using the CKD-EPI Creatinine Equation (2021)   Calcium 03/25/2021 10.1  8.4 - 10.5 mg/dL Final   Hgb A1c MFr Bld 03/25/2021 6.0   4.6 - 6.5 % Final   Glycemic Control Guidelines for People with Diabetes:Non Diabetic:  <6%Goal of Therapy: <7%Additional Action Suggested:  >8%     Physical Examination:  BP 138/78   Pulse 83   Ht '5\' 11"'  (1.803 m)   Wt 227 lb (103 kg)   SpO2 98%   BMI 31.66 kg/m    ASSESSMENT:  Diabetes type 2, non-insulin-dependent  See history of present illness for detailed discussion of current diabetes management, blood sugar patterns and problems identified     A1c is still excellent at 6  Currently on 3 drug regimen including Farxiga, metformin 1 g daily and Ozempic 0.5 mg weekly  Although his diet and exercise regimen reportedly is about the same he is frustrated with his weight gain now Not clear if this is related to his reducing Ozempic or rebounding from prolonged calorie restriction  Still trying to exercise  HYPERTENSION: Fairly well controlled    PLAN:    He will go back to 1 mg Ozempic, will need new prescription when he finishes his current supply Encouraged him to have a small breakfast with mostly protein at least and not skip meals  Follow-up in 3 months  Flu vaccine given   Patient Instructions  Ozempic 32m weekly   [name]10/13/2022, 3:56 PM   Note: This office note was prepared with Dragon voice recognition system technology. Any transcriptional errors that result from this process are unintentional.

## 2021-03-27 NOTE — Patient Instructions (Signed)
Ozempic 1mg  weekly

## 2021-04-30 ENCOUNTER — Emergency Department (HOSPITAL_BASED_OUTPATIENT_CLINIC_OR_DEPARTMENT_OTHER)
Admission: EM | Admit: 2021-04-30 | Discharge: 2021-04-30 | Disposition: A | Discharge status: Home / Self Care | Payer: Medicare Other | Attending: Emergency Medicine | Admitting: Emergency Medicine

## 2021-04-30 ENCOUNTER — Encounter (HOSPITAL_BASED_OUTPATIENT_CLINIC_OR_DEPARTMENT_OTHER): Payer: Self-pay | Admitting: *Deleted

## 2021-04-30 ENCOUNTER — Other Ambulatory Visit: Payer: Self-pay

## 2021-04-30 DIAGNOSIS — Z7984 Long term (current) use of oral hypoglycemic drugs: Secondary | ICD-10-CM | POA: Diagnosis not present

## 2021-04-30 DIAGNOSIS — S50311A Abrasion of right elbow, initial encounter: Secondary | ICD-10-CM | POA: Insufficient documentation

## 2021-04-30 DIAGNOSIS — Z79899 Other long term (current) drug therapy: Secondary | ICD-10-CM | POA: Diagnosis not present

## 2021-04-30 DIAGNOSIS — E119 Type 2 diabetes mellitus without complications: Secondary | ICD-10-CM | POA: Insufficient documentation

## 2021-04-30 DIAGNOSIS — Z7952 Long term (current) use of systemic steroids: Secondary | ICD-10-CM | POA: Diagnosis not present

## 2021-04-30 DIAGNOSIS — W19XXXA Unspecified fall, initial encounter: Secondary | ICD-10-CM | POA: Diagnosis not present

## 2021-04-30 DIAGNOSIS — I10 Essential (primary) hypertension: Secondary | ICD-10-CM | POA: Insufficient documentation

## 2021-04-30 DIAGNOSIS — Z7982 Long term (current) use of aspirin: Secondary | ICD-10-CM | POA: Diagnosis not present

## 2021-04-30 DIAGNOSIS — S8011XA Contusion of right lower leg, initial encounter: Secondary | ICD-10-CM | POA: Diagnosis not present

## 2021-04-30 DIAGNOSIS — S8991XA Unspecified injury of right lower leg, initial encounter: Secondary | ICD-10-CM | POA: Diagnosis present

## 2021-04-30 DIAGNOSIS — Z8616 Personal history of COVID-19: Secondary | ICD-10-CM | POA: Insufficient documentation

## 2021-04-30 NOTE — ED Provider Notes (Signed)
[facility] EMERGENCY DEPARTMENT Provider Note   CSN: [id number] Arrival date & time: 04/30/21  1749     History Chief Complaint  Patient presents with   Leg Injury    Evan [name] is a 65 y.o. male.  Presented to ER with concern for leg injury.  States that he fell during a pickleball game and thinks that his keys in his right thigh pocket may have struck his right thigh.  Afterwards he noted significant swelling in his right thigh.  Has been able to walk.  Denies any other major injuries.  No numbness or weakness.  Pain is mild to moderate.  Does not want anything for the pain at present.  Will take over-the-counter meds at home.  Takes Plavix.  HPI     Past Medical History:  Diagnosis Date   Adrenal adenoma    Complication of anesthesia    difficulty to weak up   COVID-19 virus infection 05/2020   Depression    intolerant of cymbalta and wellbutrin   Diverticulosis of colon    Esophagitis 03/2015   by EGD   Gastritis 03/2015   by EGD - mild, chronic   Hyperlipidemia    Hypertension    Left ventricular hypertrophy    Lichen simplex chronicus 05/2015   R scrotum [name])   Liver lesion    2022 CTs: There was an incidental finding of a hypervascular lesion in the right lobe of the liver as well as left adrenal nodule. He underwent MRI of the abdomen showing the appearance of the liver favored focal nodular hyperplasia, as well as small left adrenal adenoma.   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 Angelena Form & Dr. 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   Rising PSA level 08/20/2020   Severe aortic stenosis    Advanced directives, counseling/discussion 12/21/2018   Seasonal allergic rhinitis 10/07/2017   Hepatic steatosis 12/19/2016   Carotid stenosis 04/04/2016   OSA (obstructive sleep apnea) 02/18/2016   Chronic cough 09/18/2015   NAFLD (nonalcoholic fatty liver disease) 01/2015   Obesity, Class I, BMI 49-70.2 63/78/5885   Lichen simplex chronicus 03/26/2014   GERD (gastroesophageal reflux disease) 12/22/2012   Ophthalmic migraine    Controlled diabetes mellitus type 2 with complications ([place]) 02/77/4128   Hyperlipidemia associated with type 2 diabetes mellitus ([place]) 12/03/2009   MDD (major depressive disorder), recurrent, in full remission ([place]) 12/03/2009   Essential hypertension 12/03/2009   DIVERTICULOSIS, COLON 12/03/2009   ERECTILE DYSFUNCTION, 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   SHOULDER IMPINGEMENT SYNDROME, RIGHT 12/03/2009    Past Surgical History:  Procedure Laterality Date   COLONOSCOPY  2006   diverticulosis per pt   COLONOSCOPY  03/2015   2 polyps, rpt 5 yrs [name])   ESOPHAGOGASTRODUODENOSCOPY  03/2015   mild chronic gastritis, esophagitis    INTRAOPERATIVE TRANSTHORACIC ECHOCARDIOGRAM Left 10/29/2020   Procedure: INTRAOPERATIVE TRANSTHORACIC ECHOCARDIOGRAM;  Surgeon: Burnell Blanks, MD;  Location:  [facility];  Service: Open Heart Surgery;  Laterality: Left;   LUMBAR LAMINECTOMY  1989   L5-S1   RIGHT/LEFT HEART CATH AND CORONARY ANGIOGRAPHY N/A 08/15/2020   Procedure: RIGHT/LEFT HEART CATH AND CORONARY ANGIOGRAPHY;  Surgeon: Burnell Blanks, MD;  Location: [facility];  Service: Cardiovascular;  Laterality: N/A;   TRANSCATHETER AORTIC VALVE REPLACEMENT, TRANSFEMORAL Bilateral 10/29/2020   Procedure: TRANSCATHETER AORTIC VALVE REPLACEMENT, TRANSFEMORAL;  Surgeon: Burnell Blanks, MD;  Location: [facility];  Service: Open  Heart Surgery;  Laterality: Bilateral;   ULTRASOUND GUIDANCE FOR VASCULAR ACCESS Bilateral 10/29/2020   Procedure: ULTRASOUND GUIDANCE FOR VASCULAR ACCESS;  Surgeon: Burnell Blanks, MD;  Location: [facility];  Service: Open Heart Surgery;  Laterality: Bilateral;       Family History  Problem Relation Age of Onset   Alzheimer's disease Father    Hypertension Father    Stroke Father    Hyperlipidemia Father    Prostate cancer Brother    Diabetes Brother    Alcohol abuse Brother    CAD Brother    Coronary artery disease Maternal Uncle    Schizophrenia Paternal Uncle    Emphysema Mother    Alcohol abuse Mother    Stroke Maternal Grandmother    Stroke Maternal Grandfather    Diabetes Maternal Grandfather    Stroke Paternal Grandmother    Stroke Paternal Grandfather     Social History   Tobacco Use   Smoking status: Never   Smokeless tobacco: Never  Substance Use Topics   Alcohol use: Not Currently    Alcohol/week: 0.0 standard drinks    Comment: occasional   Drug use: No    Home Medications Prior to Admission medications   Medication Sig Start Date End Date Taking? Authorizing Provider  Dulaglutide (TRULICITY) 2.77 AJ/2.8NO SOPN Inject in the abdominal skin as directed once a week Patient not taking: Reported on 03/27/2021 01/29/21   Elayne Snare, MD  amLODipine (NORVASC) 5 MG tablet Take 1 tablet (5 mg total) by mouth daily. 11/25/20   Eileen Stanford, PA-C  aspirin EC 81 MG tablet Take 81 mg by mouth every evening. Swallow whole.    [provider]  Blood Glucose Monitoring Suppl ([place]) w/Device KIT 1 kit by Does not apply route daily. Use to check blood sugars 2 times daily 01/27/17   Elayne Snare, MD  cephALEXin (KEFLEX) 500 MG capsule Take 4 capsules (2,000 mg) one hour prior to all dental visits. 11/06/20   Eileen Stanford, PA-C  Cholecalciferol (VITAMIN D3) 50 MCG (2000 UT) TABS Take 2,000 Units by mouth daily.    [provider]  ciprofloxacin-dexamethasone (CIPRODEX) OTIC suspension SMARTSIG:4 Drop(s) Left Ear Twice Daily 11/17/20   [provider]  clopidogrel (PLAVIX) 75 MG tablet Take 1 tablet (75 mg total) by mouth daily with breakfast. 10/31/20   Eileen Stanford, PA-C  Cyanocobalamin (VITAMIN B-12) 2500 MCG SUBL Take 2,500 mcg by mouth daily.    [provider]  FARXIGA 10 MG TABS tablet TAKE 1 TABLET (10 MG TOTAL) BY MOUTH EVERY MORNING. 12/30/20   Elayne Snare, MD  fluticasone [facility]) 50 MCG/ACT nasal spray Place 1 spray into both nostrils daily as needed for allergies or rhinitis.    [provider]  glucose blood (ONETOUCH VERIO) test strip USE TO CHECK BLOOD SUGARS 2 TIMES DAILY 01/01/21   Elayne Snare, MD  losartan (COZAAR) 100 MG tablet Take 50 mg by mouth daily.  [provider]  Magnesium 250 MG TABS Take 250 mg by mouth every evening. Patient not taking: Reported on 03/27/2021    [provider]  metFORMIN (GLUCOPHAGE) 1000 MG tablet TAKE 1 TABLET (1,000 MG TOTAL) BY MOUTH 2 TIMES DAILY WITH A MEAL. Patient taking differently: Take 1,000 mg by mouth every evening. 10/14/20   Elayne Snare, MD  pantoprazole (PROTONIX) 40 MG tablet TAKE 1 TABLET BY MOUTH EVERY DAY Patient not taking: Reported on 03/27/2021 01/28/21   Eileen Stanford, PA-C  rosuvastatin (CRESTOR) 5 MG tablet TAKE 1 TABLET BY MOUTH EVERY DAY 03/20/21   Ria Bush, MD  Semaglutide, 1 MG/DOSE, (OZEMPIC, 1 MG/DOSE,) 4 MG/3ML SOPN Inject 1 mg into the skin once a week. 03/01/21   Elayne Snare, MD  sildenafil (REVATIO) 20 MG tablet Take 5 tablets (100 mg total) by mouth daily as needed (relations). 12/30/20   Ria Bush, MD  TURMERIC PO Take 1,800 mg by mouth every evening.    [provider]  Vitamin D-Vitamin K (K2 PLUS D3 PO) Take 1 tablet by mouth every evening.    [provider]  Zinc 50 MG TABS Take 50 mg by mouth every evening.    [provider]    Allergies    Penicillins  Review of Systems   Review of Systems  Musculoskeletal:  Positive for arthralgias.   Physical Exam Updated Vital Signs BP (!) 171/94   Pulse 100   Temp (!) 97.4 F (36.3 C) (Oral)   Resp 18   Ht '5\' 11"'  (1.803 m)   Wt 99.8 kg   SpO2 99%   BMI 30.68 kg/m   Physical Exam Vitals and nursing note reviewed.  Constitutional:      General: He is not in acute distress.    Appearance: He is well-developed.  HENT:     Head: Normocephalic and atraumatic.  Eyes:     Conjunctiva/sclera: Conjunctivae normal.  Cardiovascular:     Rate and Rhythm: Normal rate and regular rhythm.     Heart sounds: No murmur heard. Pulmonary:     Effort: Pulmonary effort is normal. No respiratory distress.  Musculoskeletal:        General: No swelling.     Cervical back: Neck supple.     Comments: Back: no C, T, L spine TTP, no step off or deformity RUE: Abrasion to the right elbow, no tenderness to the elbow, normal joint ROM, radial pulse intact, distal sensation and motor intact LUE: no TTP throughout, no deformity, normal joint ROM, radial pulse intact, distal sensation and motor intact RLE: There is swelling and hematoma to the right anterior thigh, no laceration, normal joint ROM, distal pulse, sensation and motor intact LLE: no TTP throughout, no deformity, normal joint ROM, distal pulse, sensation and motor intact  Skin:    General: Skin is warm and dry.     Capillary Refill: Capillary refill takes less than 2 seconds.  Neurological:     Mental Status: He is alert.  Psychiatric:        Mood and Affect: Mood normal.    ED Results / Procedures / Treatments   Labs (all labs ordered are listed, but only abnormal results are displayed) Labs Reviewed - No data to display  EKG None  Radiology No results found.  Procedures Procedures   Medications Ordered in ED Medications - No data to display  ED Course  I have reviewed the triage vital signs and  the nursing notes.  Pertinent labs & imaging results that were available during my care of the patient were reviewed by me and considered in my medical decision making (see chart for details).    MDM Rules/Calculators/A&P                          65 year old presents to ER with concern for injury to his right thigh after ground-level fall playing pickle ball.  Takes Plavix.  On exam he does have a hematoma to his right anterior thigh.  Compartments are soft.  He is neurovascular intact.  Recommend supportive care at present.  No focal bony tenderness noted throughout careful inspection of his extremities.  Reviewed return precautions and discharged home.  After the discussed management above, the patient was determined to be safe for discharge.  The patient was in agreement with this plan and all questions regarding their care were answered.  ED return precautions were discussed and the patient will return to the ED with any significant worsening of condition.   Final Clinical Impression(s) / ED Diagnoses Final diagnoses:  Hematoma of right lower extremity, initial encounter  Abrasion of right elbow, initial encounter    Rx / DC Orders ED Discharge Orders     None        Lucrezia Starch, MD 05/01/21 1636

## 2021-04-30 NOTE — ED Triage Notes (Signed)
C/o mechanical fall with right thigh injury x 2 hrs ago , large hematoma to thigh. Pt is on plavix

## 2021-04-30 NOTE — Discharge Instructions (Signed)
Recommend using icing affected area, resting this evening.  Take Tylenol as needed for pain.  If you develop severe pain, significant increase in size of swelling, lightheadedness, passing out, or other new concerning symptom, come back to ER for reassessment.  If the hematoma is not resolving over the next couple days, would recommend following up with your primary doctor or with orthopedics.

## 2021-05-05 ENCOUNTER — Other Ambulatory Visit: Payer: Self-pay | Admitting: Endocrinology

## 2021-05-05 DIAGNOSIS — E1169 Type 2 diabetes mellitus with other specified complication: Secondary | ICD-10-CM

## 2021-05-07 ENCOUNTER — Other Ambulatory Visit: Payer: Self-pay | Admitting: Endocrinology

## 2021-05-07 MED ORDER — OZEMPIC (0.25 OR 0.5 MG/DOSE) 2 MG/1.5ML ~~LOC~~ SOPN: 0.5000 mg | PEN_INJECTOR | SUBCUTANEOUS | 0 refills | Status: DC

## 2021-05-15 DIAGNOSIS — H6522 Chronic serous otitis media, left ear: Secondary | ICD-10-CM | POA: Diagnosis not present

## 2021-05-21 ENCOUNTER — Encounter: Payer: Self-pay | Admitting: Internal Medicine

## 2021-05-26 ENCOUNTER — Encounter: Payer: Self-pay | Admitting: Endocrinology

## 2021-05-26 DIAGNOSIS — E119 Type 2 diabetes mellitus without complications: Secondary | ICD-10-CM | POA: Diagnosis not present

## 2021-05-26 DIAGNOSIS — H5202 Hypermetropia, left eye: Secondary | ICD-10-CM | POA: Diagnosis not present

## 2021-05-26 LAB — HM DIABETES EYE EXAM

## 2021-05-27 ENCOUNTER — Encounter: Payer: Self-pay | Admitting: Family Medicine

## 2021-05-28 ENCOUNTER — Ambulatory Visit (INDEPENDENT_AMBULATORY_CARE_PROVIDER_SITE_OTHER): Payer: Medicare Other | Admitting: Cardiology

## 2021-05-28 ENCOUNTER — Ambulatory Visit (INDEPENDENT_AMBULATORY_CARE_PROVIDER_SITE_OTHER): Payer: Medicare Other

## 2021-05-28 ENCOUNTER — Other Ambulatory Visit: Payer: Self-pay

## 2021-05-28 ENCOUNTER — Telehealth: Payer: Self-pay | Admitting: Family Medicine

## 2021-05-28 ENCOUNTER — Encounter: Payer: Self-pay | Admitting: Cardiology

## 2021-05-28 VITALS — BP 122/74 | HR 82 | Ht 71.0 in | Wt 232.0 lb

## 2021-05-28 DIAGNOSIS — E1169 Type 2 diabetes mellitus with other specified complication: Secondary | ICD-10-CM | POA: Diagnosis not present

## 2021-05-28 DIAGNOSIS — R0789 Other chest pain: Secondary | ICD-10-CM | POA: Diagnosis not present

## 2021-05-28 DIAGNOSIS — Z952 Presence of prosthetic heart valve: Secondary | ICD-10-CM

## 2021-05-28 DIAGNOSIS — G4733 Obstructive sleep apnea (adult) (pediatric): Secondary | ICD-10-CM

## 2021-05-28 DIAGNOSIS — E785 Hyperlipidemia, unspecified: Secondary | ICD-10-CM

## 2021-05-28 DIAGNOSIS — I10 Essential (primary) hypertension: Secondary | ICD-10-CM

## 2021-05-28 DIAGNOSIS — R55 Syncope and collapse: Secondary | ICD-10-CM | POA: Diagnosis not present

## 2021-05-28 DIAGNOSIS — R600 Localized edema: Secondary | ICD-10-CM | POA: Diagnosis not present

## 2021-05-28 DIAGNOSIS — Z79899 Other long term (current) drug therapy: Secondary | ICD-10-CM

## 2021-05-28 MED ORDER — FUROSEMIDE 20 MG PO TABS: 20.0000 mg | ORAL_TABLET | ORAL | 0 refills | Status: DC

## 2021-05-28 MED ORDER — POTASSIUM CHLORIDE ER 10 MEQ PO TBCR: 10.0000 meq | EXTENDED_RELEASE_TABLET | ORAL | 0 refills | Status: DC

## 2021-05-28 NOTE — Chronic Care Management (AMB) (Signed)
°  Chronic Care Management   Note  05/28/2021 Name: DARRLY LOBERG MRN: [id number] DOB: 17-Apr-1956  NAGI FURIO is a 65 y.o. year old male who is a primary care patient of Ria Bush, MD. I reached out to [name] by phone today in response to a referral sent by Mr. Drake Leach PCP, Ria Bush, MD.   Mr. General was given information about Chronic Care Management services today including:  CCM service includes personalized support from designated clinical staff supervised by his physician, including individualized plan of care and coordination with other care providers 24/7 contact phone numbers for assistance for urgent and routine care needs. Service will only be billed when office clinical staff spend 20 minutes or more in a month to coordinate care. Only one practitioner may furnish and bill the service in a calendar month. The patient may stop CCM services at any time (effective at the end of the month) by phone call to the office staff.   Patient agreed to services and verbal consent obtained.   Follow up plan:   Tatjana Secretary/administrator

## 2021-05-28 NOTE — Progress Notes (Unsigned)
Enrolled patient for a 14 day Zio XT  monitor to be mailed to patients home  °

## 2021-05-28 NOTE — Patient Instructions (Addendum)
Medication Instructions:  Your physician has recommended you make the following change in your medication:  START: Lasix 20 mg twice weekly START: Potassium 10 mEq twice weekly  These medications will be prescribed for a month.  *If you need a refill on your cardiac medications before your next appointment, please call your pharmacy*   Lab Work: Your physician recommends that you return for lab work in:  TODAY: BMET, [place], CBC If you have labs (blood work) drawn today and your tests are completely normal, you will receive your results only by: MyChart Message (if you have MyChart) OR A paper copy in the mail If you have any lab test that is abnormal or we need to change your treatment, we will call you to review the results.   Testing/Procedures: [place]- Long Term Monitor Instructions  Your physician has requested you wear a ZIO patch monitor for 14 days.  This is a single patch monitor. [employer] supplies one patch monitor per enrollment. Additional stickers are not available. Please do not apply patch if you will be having a Nuclear Stress Test,  Echocardiogram, Cardiac CT, MRI, or Chest Xray during the period you would be wearing the  monitor. The patch cannot be worn during these tests. You cannot remove and re-apply the  ZIO XT patch monitor.  Your ZIO patch monitor will be mailed 3 day USPS to your address on file. It may take 3-5 days  to receive your monitor after you have been enrolled.  Once you have received your monitor, please review the enclosed instructions. Your monitor  has already been registered assigning a specific monitor serial # to you.  Billing and Patient Assistance Program Information  We have supplied [employer] with any of your insurance information on file for billing purposes. [employer] offers a sliding scale Patient Assistance Program for patients that do not have  insurance, or whose insurance does not completely cover the cost of the ZIO monitor.  You must  apply for the Patient Assistance Program to qualify for this discounted rate.  To apply, please call [employer] at [phone], select option 4, select option 2, ask to apply for  Patient Assistance Program. Theodore Demark will ask your household income, and how many people  are in your household. They will quote your out-of-pocket cost based on that information.  [employer] will also be able to set up a 28-month, interest-free payment plan if needed.  Applying the monitor   Shave hair from upper left chest.  Hold abrader disc by orange tab. Rub abrader in 40 strokes over the upper left chest as  indicated in your monitor instructions.  Clean area with 4 enclosed alcohol pads. Let dry.  Apply patch as indicated in monitor instructions. Patch will be placed under collarbone on left  side of chest with arrow pointing upward.  Rub patch adhesive wings for 2 minutes. Remove white label marked "1". Remove the white  label marked "2". Rub patch adhesive wings for 2 additional minutes.  While looking in a mirror, press and release button in center of patch. A small green light will  flash 3-4 times. This will be your only indicator that the monitor has been turned on.  Do not shower for the first 24 hours. You may shower after the first 24 hours.  Press the button if you feel a symptom. You will hear a small click. Record Date, Time and  Symptom in the Patient Logbook.  When you are ready to remove the patch, follow  instructions on the last 2 pages of Patient  Logbook. Stick patch monitor onto the last page of Patient Logbook.  Place Patient Logbook in the blue and white box. Use locking tab on box and tape box closed  securely. The blue and white box has prepaid postage on it. Please place it in the mailbox as  soon as possible. Your physician should have your test results approximately 7 days after the  monitor has been mailed back to [facility].  Call [facility] at [phone] if  you have questions regarding  your ZIO XT patch monitor. Call them immediately if you see an orange light blinking on your  monitor.  If your monitor falls off in less than 4 days, contact our Monitor department at [phone].  If your monitor becomes loose or falls off after 4 days call [employer] at [phone] for  suggestions on securing your monitor.   Follow-Up: At [facility], you and your health needs are our priority.  As part of our continuing mission to provide you with exceptional heart care, we have created designated Provider Care Teams.  These Care Teams include your primary Cardiologist (physician) and Advanced Practice Providers (APPs -  Physician Assistants and Nurse Practitioners) who all work together to provide you with the care you need, when you need it.  We recommend signing up for the patient portal called "MyChart".  Sign up information is provided on this After Visit Summary.  MyChart is used to connect with patients for Virtual Visits (Telemedicine).  Patients are able to view lab/test results, encounter notes, upcoming appointments, etc.  Non-urgent messages can be sent to your provider as well.   To learn more about what you can do with MyChart, go to [email or web address].    Your next appointment:   6 month(s)  The format for your next appointment:   In Person  Provider:   Elouise Munroe, MD     Other Instructions

## 2021-05-28 NOTE — Progress Notes (Signed)
Cardiology Office Note:    Date:  05/28/2021   ID:  Evan [name], DOB 02/23/1956, MRN [id number]  PCP:  Ria Bush, MD  Cardiologist:  Elouise Munroe, MD  Electrophysiologist:  None   Referring MD: Ria Bush, MD   Chief Complaint  Patient presents with   Follow-up   Edema   Chest Pain    Pressure.    History of Present Illness:    Evan [name] is a 65 y.o. male with a hx of depression, GERD, hypertension, hyperlipidemia, fatty liver disease, diabetes mellitus type 2, history of bicuspid aortic valve status post TAVR on Oct 29, 2020, left heart catheterization which was done in March 2020 showed no evidence of coronary artery disease.  The patient tells me for a while now he has been experiencing some chest pressure.  He notes that it comes and goes.  Usually when he is walking or sitting there.  He says it feels as if it is an anxiety attack.  Sometimes he has some associated loss of energy he notes that he has had some dizziness that is associated with this.  He tells me about an episode where 3 weeks ago he was playing ball he ran into the room that he tried to get the ball he felt that hematoma.  Couple days after that that night he was going to the bathroom he had an episode where he had a syncope event.  He described as he was walking to the bathroom when he suddenly fell and lost consciousness.  He is not sure how long he was down for.  But he thinks just a matter of seconds.  He did not really seek medical care for this.   Past Medical History:  Diagnosis Date   Adrenal adenoma    Complication of anesthesia    difficulty to weak up   COVID-19 virus infection 05/2020   Depression    intolerant of cymbalta and wellbutrin   Diverticulosis of colon    Esophagitis 03/2015   by EGD   Gastritis 03/2015   by EGD - mild, chronic   Hyperlipidemia    Hypertension    Left ventricular hypertrophy    Lichen simplex chronicus 05/2015   R scrotum [name])    Liver lesion    2022 CTs: There was an incidental finding of a hypervascular lesion in the right lobe of the liver as well as left adrenal nodule. He underwent MRI of the abdomen showing the appearance of the liver favored focal nodular hyperplasia, as well as small left adrenal adenoma.   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 Angelena Form & Dr. Cyndia Bent    Severe aortic stenosis    T2DM (type 2 diabetes mellitus) ([place])    DSME 09/2013    Past Surgical History:  Procedure Laterality Date   COLONOSCOPY  2006   diverticulosis per pt   COLONOSCOPY  03/2015   2 polyps, rpt 5 yrs [name])   ESOPHAGOGASTRODUODENOSCOPY  03/2015   mild chronic gastritis, esophagitis    INTRAOPERATIVE TRANSTHORACIC ECHOCARDIOGRAM Left 10/29/2020   Procedure: INTRAOPERATIVE TRANSTHORACIC ECHOCARDIOGRAM;  Surgeon: Burnell Blanks, MD;  Location: [facility];  Service: Open Heart Surgery;  Laterality: Left;   LUMBAR LAMINECTOMY  1989   L5-S1  RIGHT/LEFT HEART CATH AND CORONARY ANGIOGRAPHY N/A 08/15/2020   Procedure: RIGHT/LEFT HEART CATH AND CORONARY ANGIOGRAPHY;  Surgeon: Burnell Blanks, MD;  Location: [facility];  Service: Cardiovascular;  Laterality: N/A;   TRANSCATHETER AORTIC VALVE REPLACEMENT, TRANSFEMORAL Bilateral 10/29/2020   Procedure: TRANSCATHETER AORTIC VALVE REPLACEMENT, TRANSFEMORAL;  Surgeon: Burnell Blanks, MD;  Location: [facility];  Service: Open Heart Surgery;  Laterality: Bilateral;   ULTRASOUND GUIDANCE FOR VASCULAR ACCESS Bilateral 10/29/2020   Procedure: ULTRASOUND GUIDANCE FOR VASCULAR ACCESS;  Surgeon: Burnell Blanks, MD;  Location: [facility];  Service: Open Heart Surgery;  Laterality: Bilateral;    Current Medications: Current Meds  Medication Sig   amLODipine  (NORVASC) 5 MG tablet Take 1 tablet (5 mg total) by mouth daily.   aspirin EC 81 MG tablet Take 81 mg by mouth every evening. Swallow whole.   Blood Glucose Monitoring Suppl (ONETOUCH VERIO FLEX SYSTEM) w/Device KIT 1 kit by Does not apply route daily. Use to check blood sugars 2 times daily   cephALEXin (KEFLEX) 500 MG capsule Take 4 capsules (2,000 mg) one hour prior to all dental visits.   Cholecalciferol (VITAMIN D3) 50 MCG (2000 UT) TABS Take 2,000 Units by mouth daily.   Cyanocobalamin (VITAMIN B-12) 2500 MCG SUBL Take 2,500 mcg by mouth daily.   FARXIGA 10 MG TABS tablet TAKE 1 TABLET (10 MG TOTAL) BY MOUTH EVERY MORNING.   fluticasone (FLONASE) 50 MCG/ACT nasal spray Place 1 spray into both nostrils daily as needed for allergies or rhinitis.   [START ON 05/29/2021] furosemide (LASIX) 20 MG tablet Take 1 tablet (20 mg total) by mouth 2 (two) times a week.   glucose blood (ONETOUCH VERIO) test strip USE TO CHECK BLOOD SUGARS 2 TIMES DAILY   losartan (COZAAR) 100 MG tablet Take 50 mg by mouth daily.   Magnesium 250 MG TABS Take 250 mg by mouth every evening.   metFORMIN (GLUCOPHAGE) 1000 MG tablet TAKE 1 TABLET (1,000 MG TOTAL) BY MOUTH 2 TIMES DAILY WITH A MEAL. (Patient taking differently: Take 1,000 mg by mouth every evening.)   pantoprazole (PROTONIX) 40 MG tablet TAKE 1 TABLET BY MOUTH EVERY DAY   [START ON 05/29/2021] potassium chloride (KLOR-CON) 10 MEQ tablet Take 1 tablet (10 mEq total) by mouth 2 (two) times a week.   rosuvastatin (CRESTOR) 5 MG tablet TAKE 1 TABLET BY MOUTH EVERY DAY   sildenafil (REVATIO) 20 MG tablet Take 5 tablets (100 mg total) by mouth daily as needed (relations).   TURMERIC PO Take 1,800 mg by mouth every evening.   Vitamin D-Vitamin K (K2 PLUS D3 PO) Take 1 tablet by mouth every evening.   Zinc 50 MG TABS Take 50 mg by mouth every evening.     Allergies:   Penicillins   Social History   Socioeconomic History   Marital status: Married    Spouse name:  Not on file   Number of children: 2   Years of education: Not on file   Highest education level: Not on file  Occupational History   Occupation: Retired-Worked for the [employer]  Tobacco Use   Smoking status: Never   Smokeless tobacco: Never  Substance and Sexual Activity   Alcohol use: Not Currently    Alcohol/week: 0.0 standard drinks    Comment: occasional   Drug use: No   Sexual activity: Not on file  Other Topics Concern   Not on file  Social History Narrative   Caffeine: 3 cups coffee/day  Lives with wife and daughter, son in college, 2 cats and 1 dog   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  Keeps record of caloric intake   Social Determinants of Health   Financial Resource Strain: Low Risk    Difficulty of Paying Living Expenses: Not hard at all  Food Insecurity: No Food Insecurity   Worried About RCharity fundraiserin the Last Year: Never true   Ran Out of Food in the Last Year: Never true  Transportation Needs: No Transportation Needs   Lack of Transportation (Medical): No   Lack of Transportation (Non-Medical): No  Physical Activity: Sufficiently Active   Days of Exercise per Week: 7 days   Minutes of Exercise per Session: 60 min  Stress: No Stress Concern Present   Feeling of Stress : Not at all  Social Connections: Not on file     Family History: The patient's family history includes Alcohol abuse in his brother and mother; Alzheimer's disease in his father; CAD in his brother; Coronary artery disease in his maternal uncle; Diabetes in his brother and maternal grandfather; Emphysema in his mother; Hyperlipidemia in his father; Hypertension in his father; Prostate cancer in his brother; Schizophrenia in his paternal uncle; Stroke in his father, maternal grandfather, maternal grandmother, paternal grandfather, and paternal grandmother.  ROS:    Review of Systems  Constitution: Negative for decreased appetite, fever and weight gain.  HENT: Negative for congestion, ear discharge, hoarse voice and sore throat.   Eyes: Negative for discharge, redness, vision loss in right eye and visual halos.  Cardiovascular: Reports chest pressure.  Negative for dyspnea on exertion, leg swelling, orthopnea and palpitations.  Respiratory: Negative for cough, hemoptysis, shortness of breath and snoring.   Endocrine: Negative for heat intolerance and polyphagia.  Hematologic/Lymphatic: Negative for bleeding problem. Does not bruise/bleed easily.  Skin: Negative for flushing, nail changes, rash and suspicious lesions.  Musculoskeletal: Negative for arthritis, joint pain, muscle cramps, myalgias, neck pain and stiffness.  Gastrointestinal: Negative for abdominal pain, bowel incontinence, diarrhea and excessive appetite.  Genitourinary: Negative for decreased libido, genital sores and incomplete emptying.  Neurological: Negative for brief paralysis, focal weakness, headaches and loss of balance.  Psychiatric/Behavioral: Negative for altered mental status, depression and suicidal ideas.  Allergic/Immunologic: Negative for HIV exposure and persistent infections.    EKGs/Labs/Other Studies Reviewed:    The following studies were reviewed today:   EKG:  The ekg ordered today demonstrates normal sinus rhythm heart rate 82 bpm compared to his EKG in May 2022 patient with Bundle branch block.  Right/Left heart catheterization 08/15/2020 1. No angiographic evidence of CAD 2. Severe aortic stenosis by echo with likely bicuspid aortic valve. Cath with mean gradient 34 mmHg, peak to peak gradient 44 mmHg.    Recommendations: Will continue workup for AVR vs TAVR. Will plan CT scans and then will review with valve team. At this time he is relatively asymptomatic and we may be able to follow his aortic stenosis for now.      TAVR OPERATIVE NOTE     Date of  Procedure:                10/29/2020   Preoperative Diagnosis:      Severe Aortic Stenosis    Postoperative Diagnosis:    Same    Procedure:        Transcatheter Aortic Valve Replacement -  Percutaneous Right Transfemoral Approach             Edwards Sapien 3  THV (size 29 mm, model # 9600TFX, serial # [id number])              Co-Surgeons:                        Gaye Pollack, MD and  Lauree Chandler, MD     Anesthesiologist:                  Renaldo Reel, MD   Echocardiographer:              Osborne Oman, MD   Pre-operative Echo Findings: Severe aortic stenosis Normal left ventricular systolic function   Post-operative Echo Findings: No paravalvular leak Normal left ventricular systolic function   _____________     Echo 10/30/20:  IMPRESSIONS   1. The aortic valve has been repaired/replaced. Aortic valve  regurgitation is not visualized. There is a 29 mm Sapien prosthetic (TAVR)  valve present in the aortic position. Procedure Date: 10/29/2020. Aortic  valve area, by VTI measures 5.17 cm. Aortic  valve mean gradient measures 6.0 mmHg. Aortic valve Vmax measures 1.51  m/s. Aortic valve acceleration time measures 84 msec. Trivial paravalular  leak at the 12 o'Clock Position; DVI 78.   2. Left ventricular ejection fraction, by estimation, is 55 to 60%. The  left ventricle has normal function.   3. Right ventricular systolic function is normal. The right ventricular  size is normal.   4. The mitral valve is grossly normal. No evidence of mitral valve  regurgitation.   Comparison(s): A prior study was performed on 10/29/20. Stable placement of  TAVR valve.    _______________________     Echo 10/30/2020 IMPRESSIONS   1. The aortic valve has been repaired/replaced. Aortic valve  regurgitation is not visualized. There is a 29 mm Sapien prosthetic (TAVR)  valve present in the aortic position. Procedure Date: 10/29/2020. Aortic  valve area, by VTI measures 5.17 cm.  Aortic  valve mean gradient measures 6.0 mmHg. Aortic valve Vmax measures 1.51  m/s. Aortic valve acceleration time measures 84 msec. Trivial paravalular  leak at the 12 o'Clock Position; DVI 78.   2. Left ventricular ejection fraction, by estimation, is 55 to 60%. The  left ventricle has normal function.   3. Right ventricular systolic function is normal. The right ventricular  size is normal.   4. The mitral valve is grossly normal. No evidence of mitral valve  regurgitation.   Comparison(s): A prior study was performed on 10/29/20. Stable placement of  TAVR valve.    __________________________     Echo 11/25/20 IMPRESSIONS  1. Left ventricular ejection fraction by 3D volume is 66 %. The left  ventricle has normal function. The left ventricle has no regional wall  motion abnormalities. Left ventricular diastolic parameters were normal.   2. Right ventricular systolic function is normal. The right ventricular  size is normal. There is normal pulmonary artery systolic pressure.   3. The mitral valve is grossly normal. No evidence of mitral valve  regurgitation.   4. The aortic valve has been repaired/replaced. Aortic valve  regurgitation is not visualized.     Recent Labs: 10/30/2020: Hemoglobin 13.0; Magnesium 1.9; Platelets 221 12/25/2020: ALT 29 03/25/2021: BUN 14; Creatinine, Ser 0.86; Potassium 4.4; Sodium 135  Recent Lipid Panel    Component Value Date/Time   CHOL 186 12/25/2020 1110  TRIG 59.0 12/25/2020 1110   HDL 91.40 12/25/2020 1110   CHOLHDL 2 12/25/2020 1110   VLDL 11.8 12/25/2020 1110   LDLCALC 82 12/25/2020 1110   LDLDIRECT 99.0 05/20/2015 0955    Physical Exam:    VS:  BP 122/74 (BP Location: Left Arm, Patient Position: Sitting, Cuff Size: Large)    Pulse 82    Ht '5\' 11"'  (1.803 m)    Wt 232 lb (105.2 kg)    BMI 32.36 kg/m     Wt Readings from Last 3 Encounters:  05/28/21 232 lb (105.2 kg)  04/30/21 220 lb (99.8 kg)  03/27/21 227 lb (103 kg)      GEN: Well nourished, well developed in no acute distress HEENT: Normal NECK: No JVD; No carotid bruits LYMPHATICS: No lymphadenopathy CARDIAC: S1S2 noted,RRR, no murmurs, rubs, gallops RESPIRATORY:  Clear to auscultation without rales, wheezing or rhonchi  ABDOMEN: Soft, non-tender, non-distended, +bowel sounds, no guarding. EXTREMITIES: +2 bilateral leg edema, No cyanosis, no clubbing MUSCULOSKELETAL:  No deformity  SKIN: Warm and dry NEUROLOGIC:  Alert and oriented x 3, non-focal PSYCHIATRIC:  Normal affect, good insight  ASSESSMENT:    1. Chest pressure   2. Syncope and collapse   3. Bilateral leg edema   4. Medication management   5. Essential hypertension   6. OSA (obstructive sleep apnea)   7. Hyperlipidemia associated with type 2 diabetes mellitus ([place])   8. S/P TAVR (transcatheter aortic valve replacement)    PLAN:     His chest pressure which is atypical I do think is likely related to anxiety he had left heart catheterization back in March 2022 no evidence of coronary artery disease.  We will monitor closely and if his clinical picture changes we will certainly pursue ischemic evaluation.  In the meantime he has had a syncope episode that was not worked up.  I am going to place a monitor the patient to make sure that he is not having any evidence of arrhythmia.  With his bilateral leg edema we will give the patient Lasix twice a week 20 mg to see if there is any improvement.  He recently had an echo in June 2022 which was essentially normal we will hold off on repeating echocardiogram at this time.   The patient is in agreement with the above plan. The patient left the office in stable condition.  The patient will follow up in 6 months with his primary cardiologist Dr. Margaretann Loveless.   Medication Adjustments/Labs and Tests Ordered: Current medicines are reviewed at length with the patient today.  Concerns regarding medicines are outlined above.  Orders Placed This  Encounter  Procedures   Basic Metabolic Panel (BMET)   Magnesium   CBC with Differential/Platelet   LONG TERM MONITOR (3-14 DAYS)   EKG 12-Lead   Meds ordered this encounter  Medications   furosemide (LASIX) 20 MG tablet    Sig: Take 1 tablet (20 mg total) by mouth 2 (two) times a week.    Dispense:  8 tablet    Refill:  0   potassium chloride (KLOR-CON) 10 MEQ tablet    Sig: Take 1 tablet (10 mEq total) by mouth 2 (two) times a week.    Dispense:  8 tablet    Refill:  0    Patient Instructions  Medication Instructions:  Your physician has recommended you make the following change in your medication:  START: Lasix 20 mg twice weekly START: Potassium 10 mEq twice weekly  These medications  will be prescribed for a month.  *If you need a refill on your cardiac medications before your next appointment, please call your pharmacy*   Lab Work: Your physician recommends that you return for lab work in:  TODAY: BMET, [place], CBC If you have labs (blood work) drawn today and your tests are completely normal, you will receive your results only by: MyChart Message (if you have MyChart) OR A paper copy in the mail If you have any lab test that is abnormal or we need to change your treatment, we will call you to review the results.   Testing/Procedures: [place] [place]- Long Term Monitor Instructions  Your physician has requested you wear a ZIO patch monitor for 14 days.  This is a single patch monitor. [employer] supplies one patch monitor per enrollment. Additional stickers are not available. Please do not apply patch if you will be having a Nuclear Stress Test,  Echocardiogram, Cardiac CT, MRI, or Chest Xray during the period you would be wearing the  monitor. The patch cannot be worn during these tests. You cannot remove and re-apply the  ZIO XT patch monitor.  Your ZIO patch monitor will be mailed 3 day USPS to your address on file. It may take 3-5 days  to receive your monitor after you have  been enrolled.  Once you have received your monitor, please review the enclosed instructions. Your monitor  has already been registered assigning a specific monitor serial # to you.  Billing and Patient Assistance Program Information  We have supplied [employer] with any of your insurance information on file for billing purposes. [employer] offers a sliding scale Patient Assistance Program for patients that do not have  insurance, or whose insurance does not completely cover the cost of the ZIO monitor.  You must apply for the Patient Assistance Program to qualify for this discounted rate.  To apply, please call [employer] at [phone], select option 4, select option 2, ask to apply for  Patient Assistance Program. Theodore Demark will ask your household income, and how many people  are in your household. They will quote your out-of-pocket cost based on that information.  [employer] will also be able to set up a 51-month interest-free payment plan if needed.  Applying the monitor   Shave hair from upper left chest.  Hold abrader disc by orange tab. Rub abrader in 40 strokes over the upper left chest as  indicated in your monitor instructions.  Clean area with 4 enclosed alcohol pads. Let dry.  Apply patch as indicated in monitor instructions. Patch will be placed under collarbone on left  side of chest with arrow pointing upward.  Rub patch adhesive wings for 2 minutes. Remove white label marked "1". Remove the white  label marked "2". Rub patch adhesive wings for 2 additional minutes.  While looking in a mirror, press and release button in center of patch. A small green light will  flash 3-4 times. This will be your only indicator that the monitor has been turned on.  Do not shower for the first 24 hours. You may shower after the first 24 hours.  Press the button if you feel a symptom. You will hear a small click. Record Date, Time and  Symptom in the Patient Logbook.  When you are ready to remove the  patch, follow instructions on the last 2 pages of Patient  Logbook. Stick patch monitor onto the last page of Patient Logbook.  Place Patient Logbook in the blue and white box.  Use locking tab on box and tape box closed  securely. The blue and white box has prepaid postage on it. Please place it in the mailbox as  soon as possible. Your physician should have your test results approximately 7 days after the  monitor has been mailed back to [facility].  Call [facility] at [phone] if you have questions regarding  your ZIO XT patch monitor. Call them immediately if you see an orange light blinking on your  monitor.  If your monitor falls off in less than 4 days, contact our Monitor department at [phone].  If your monitor becomes loose or falls off after 4 days call [employer] at [phone] for  suggestions on securing your monitor.   Follow-Up: At [facility], you and your health needs are our priority.  As part of our continuing mission to provide you with exceptional heart care, we have created designated Provider Care Teams.  These Care Teams include your primary Cardiologist (physician) and Advanced Practice Providers (APPs -  Physician Assistants and Nurse Practitioners) who all work together to provide you with the care you need, when you need it.  We recommend signing up for the patient portal called "MyChart".  Sign up information is provided on this After Visit Summary.  MyChart is used to connect with patients for Virtual Visits (Telemedicine).  Patients are able to view lab/test results, encounter notes, upcoming appointments, etc.  Non-urgent messages can be sent to your provider as well.   To learn more about what you can do with MyChart, go to [email or web address].    Your next appointment:   6 month(s)  The format for your next appointment:   In Person  Provider:   Elouise Munroe, MD     Other Instructions     Adopting a Healthy  Lifestyle.  Know what a healthy weight is for you (roughly BMI <25) and aim to maintain this   Aim for 7+ servings of fruits and vegetables daily   65-80+ fluid ounces of water or unsweet tea for healthy kidneys   Limit to max 1 drink of alcohol per day; avoid smoking/tobacco   Limit animal fats in diet for cholesterol and heart health - choose grass fed whenever available   Avoid highly processed foods, and foods high in saturated/trans fats   Aim for low stress - take time to unwind and care for your mental health   Aim for 150 min of moderate intensity exercise weekly for heart health, and weights twice weekly for bone health   Aim for 7-9 hours of sleep daily   When it comes to diets, agreement about the perfect plan isnt easy to find, even among the experts. Experts at the [employer] developed an idea known as the Healthy Eating Plate. Just imagine a plate divided into logical, healthy portions.   The emphasis is on diet quality:   Load up on vegetables and fruits - one-half of your plate: Aim for color and variety, and remember that potatoes dont count.   Go for whole grains - one-quarter of your plate: Whole wheat, barley, wheat berries, quinoa, oats, brown rice, and foods made with them. If you want pasta, go with whole wheat pasta.   Protein power - one-quarter of your plate: Fish, chicken, beans, and nuts are all healthy, versatile protein sources. Limit red meat.   The diet, however, does go beyond the plate, offering a few other suggestions.   Use healthy plant  oils, such as olive, canola, soy, corn, sunflower and peanut. Check the labels, and avoid partially hydrogenated oil, which have unhealthy trans fats.   If youre thirsty, drink water. Coffee and tea are good in moderation, but skip sugary drinks and limit milk and dairy products to one or two daily servings.   The type of carbohydrate in the diet is more important than the amount. Some  sources of carbohydrates, such as vegetables, fruits, whole grains, and beans-are healthier than others.   Finally, stay active  Signed, Berniece Salines, DO  05/28/2021 10:34 AM    [place]

## 2021-05-29 ENCOUNTER — Ambulatory Visit: Payer: Medicare Other | Admitting: Internal Medicine

## 2021-05-29 LAB — BASIC METABOLIC PANEL
BUN/Creatinine Ratio: 11 (ref 10–24)
BUN: 11 mg/dL (ref 8–27)
CO2: 24 mmol/L (ref 20–29)
Calcium: 9.7 mg/dL (ref 8.6–10.2)
Chloride: 98 mmol/L (ref 96–106)
Creatinine, Ser: 0.96 mg/dL (ref 0.76–1.27)
Glucose: 138 mg/dL — ABNORMAL HIGH (ref 70–99)
Potassium: 5.1 mmol/L (ref 3.5–5.2)
Sodium: 140 mmol/L (ref 134–144)
eGFR: 87 mL/min/{1.73_m2} (ref >59)

## 2021-05-29 LAB — CBC WITH DIFFERENTIAL/PLATELET
Basophils Absolute: 0 10*3/uL (ref 0.0–0.2)
Basos: 1 %
EOS (ABSOLUTE): 0.1 10*3/uL (ref 0.0–0.4)
Eos: 1 %
Hematocrit: 45.4 % (ref 37.5–51.0)
Hemoglobin: 15 g/dL (ref 13.0–17.7)
Immature Grans (Abs): 0 10*3/uL (ref 0.0–0.1)
Immature Granulocytes: 0 %
Lymphocytes Absolute: 1.7 10*3/uL (ref 0.7–3.1)
Lymphs: 34 %
MCH: 32.5 pg (ref 26.6–33.0)
MCHC: 33 g/dL (ref 31.5–35.7)
MCV: 99 fL — ABNORMAL HIGH (ref 79–97)
Monocytes Absolute: 0.6 10*3/uL (ref 0.1–0.9)
Monocytes: 12 %
Neutrophils Absolute: 2.6 10*3/uL (ref 1.4–7.0)
Neutrophils: 52 %
Platelets: 241 10*3/uL (ref 150–450)
RBC: 4.61 x10E6/uL (ref 4.14–5.80)
RDW: 13.2 % (ref 11.6–15.4)
WBC: 5.1 10*3/uL (ref 3.4–10.8)

## 2021-05-29 LAB — MAGNESIUM: Magnesium: 2 mg/dL (ref 1.6–2.3)

## 2021-06-02 ENCOUNTER — Telehealth: Payer: Self-pay | Admitting: Endocrinology

## 2021-06-02 ENCOUNTER — Ambulatory Visit: Payer: Medicare Other | Admitting: Physician Assistant

## 2021-06-02 DIAGNOSIS — R55 Syncope and collapse: Secondary | ICD-10-CM

## 2021-06-02 NOTE — Telephone Encounter (Signed)
PT picked up 1 box of Ozempic 06/02/21

## 2021-06-17 ENCOUNTER — Other Ambulatory Visit: Payer: Self-pay | Admitting: Cardiology

## 2021-06-17 ENCOUNTER — Other Ambulatory Visit: Payer: Self-pay | Admitting: Endocrinology

## 2021-06-20 ENCOUNTER — Other Ambulatory Visit: Payer: Self-pay | Admitting: Cardiology

## 2021-06-23 DIAGNOSIS — R55 Syncope and collapse: Secondary | ICD-10-CM | POA: Diagnosis not present

## 2021-06-26 ENCOUNTER — Other Ambulatory Visit: Payer: Self-pay

## 2021-06-26 ENCOUNTER — Encounter: Payer: Self-pay | Admitting: Cardiology

## 2021-06-26 MED ORDER — METOPROLOL SUCCINATE ER 25 MG PO TB24: 12.5000 mg | ORAL_TABLET | Freq: Every day | ORAL | 3 refills | Status: DC

## 2021-06-26 NOTE — Progress Notes (Signed)
Prescription sent to pharmacy.  Pt asked bout Lasix. He received a refill, "I thought it was just for a month? Am I supposed to be taking that too?" After looking at his chart I see it was just for a month. "I think I'm better." Medication list updated.

## 2021-06-26 NOTE — Telephone Encounter (Signed)
Spoke to patient he already spoke to Dr.Tobb's RN.

## 2021-06-30 ENCOUNTER — Encounter: Payer: Self-pay | Admitting: Cardiology

## 2021-07-01 ENCOUNTER — Ambulatory Visit (INDEPENDENT_AMBULATORY_CARE_PROVIDER_SITE_OTHER): Payer: Medicare Other | Admitting: Family Medicine

## 2021-07-01 ENCOUNTER — Encounter: Payer: Self-pay | Admitting: Family Medicine

## 2021-07-01 ENCOUNTER — Other Ambulatory Visit: Payer: Self-pay

## 2021-07-01 VITALS — BP 134/72 | HR 79 | Temp 97.5°F | Ht 71.0 in | Wt 230.4 lb

## 2021-07-01 DIAGNOSIS — I10 Essential (primary) hypertension: Secondary | ICD-10-CM | POA: Diagnosis not present

## 2021-07-01 DIAGNOSIS — H9192 Unspecified hearing loss, left ear: Secondary | ICD-10-CM | POA: Insufficient documentation

## 2021-07-01 DIAGNOSIS — Z952 Presence of prosthetic heart valve: Secondary | ICD-10-CM | POA: Diagnosis not present

## 2021-07-01 DIAGNOSIS — M25511 Pain in right shoulder: Secondary | ICD-10-CM

## 2021-07-01 DIAGNOSIS — N529 Male erectile dysfunction, unspecified: Secondary | ICD-10-CM | POA: Diagnosis not present

## 2021-07-01 DIAGNOSIS — E118 Type 2 diabetes mellitus with unspecified complications: Secondary | ICD-10-CM | POA: Diagnosis not present

## 2021-07-01 DIAGNOSIS — G8929 Other chronic pain: Secondary | ICD-10-CM | POA: Diagnosis not present

## 2021-07-01 DIAGNOSIS — H90A32 Mixed conductive and sensorineural hearing loss, unilateral, left ear with restricted hearing on the contralateral side: Secondary | ICD-10-CM | POA: Diagnosis not present

## 2021-07-01 MED ORDER — SILDENAFIL CITRATE 20 MG PO TABS: 100.0000 mg | ORAL_TABLET | Freq: Every day | ORAL | 6 refills | Status: DC | PRN

## 2021-07-01 NOTE — Assessment & Plan Note (Signed)
Exam today consistent with R shoulder bursitis, no frozen shoulder. rec continue ibuprofen PRN, add ROM exercises, provided with handout for shoulder bursitis from [facility] pt advisor. Update if not improving for outpatient PT referral.

## 2021-07-01 NOTE — Assessment & Plan Note (Addendum)
Refill sildenafil 20mg  (100mg /dose) to [place] - he uses [facility] for this.

## 2021-07-01 NOTE — Assessment & Plan Note (Signed)
Appreciate care of endocrinology.  Continue current regimen of ozempic, farxiga, metformin.

## 2021-07-01 NOTE — Assessment & Plan Note (Signed)
Chronic, well controlled on current regimen.

## 2021-07-01 NOTE — Assessment & Plan Note (Addendum)
S/p L ear tube, latest 05/15/2021, without significant benefit. I did suggest he call ENT to touch base on next steps.  Ear tube remains in place without cerumen.

## 2021-07-01 NOTE — Progress Notes (Signed)
Patient ID: Evan [name], male    DOB: 1955/06/26, 66 y.o.   MRN: [id number]  This visit was conducted in person.  BP 134/72    Pulse 79    Temp (!) 97.5 F (36.4 C) (Temporal)    Ht '5\' 11"'  (1.803 m)    Wt 230 lb 6 oz (104.5 kg)    SpO2 97%    BMI 32.13 kg/m    CC: 6 mo DM f/u visit  Subjective:   HPI: Evan [name] is a 66 y.o. male presenting on 07/01/2021 for Follow-up (Here for 6 mo f/u.)   Last wellness visit 12/2020.   Notes ongoing constant R posterior shoulder pain worse with palpitations. Denies inciting trauma/injury or falls. Ongoing pain for years. Treats with stretching exercises at home as well as advil 472m 1-2 times a day as needed. Heat didn't help. No neck pain or numbness/weakness of hand. Some shooting pain down inner arm to elbow. R handed.   S/p TAVR for severe AS 10/2020.  Catheterization 08/2020 - no evidence of CAD.  Now off plavix.   Recent episodes of chest pressure associated with palpitations s/p cardiology evaluation - this has improved with lower alcohol and caffeine use.  Trial metoprolol - felt horrible due to bradycardia 40s, lightheaded, nauseated. Now off metoprolol.  Has f/u 10/2021.   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   DM - followed by endocrinology Evan [name] seen 03/2021. Great control on current regimen of ozempic 1 mg weekly (tolerating well), metformin 10042mBID, and farxiga 1084maily.  Lab Results  Component Value Date   HGBA1C 6.0 03/25/2021    Consider rpt liver MRI 09/2021 for presumed nodular hyperplasia.      Relevant past medical, surgical, family and social history reviewed and updated as indicated. Interim medical history since our last visit reviewed. Allergies and medications reviewed and updated. Outpatient Medications Prior to Visit  Medication Sig Dispense  Refill   amLODipine (NORVASC) 5 MG tablet Take 1 tablet (5 mg total) by mouth daily. 90 tablet 3   aspirin EC 81 MG tablet Take 81 mg by mouth every evening. Swallow whole.     Blood Glucose Monitoring Suppl (ONETOUCH VERIO FLEX SYSTEM) w/Device KIT 1 kit by Does not apply route daily. Use to check blood sugars 2 times daily 1 kit 2   cephALEXin (KEFLEX) 500 MG capsule Take 4 capsules (2,000 mg) one hour prior to all dental visits. 12 capsule 12   Cholecalciferol (VITAMIN D3) 50 MCG (2000 UT) TABS Take 2,000 Units by mouth daily.     ciprofloxacin-dexamethasone (CIPRODEX) OTIC suspension      Cyanocobalamin (VITAMIN B-12) 2500 MCG SUBL Take 2,500 mcg by mouth daily.     FARXIGA 10 MG TABS tablet TAKE 1 TABLET (10 MG TOTAL) BY MOUTH EVERY MORNING. 30 tablet 2   fluticasone (FLONASE) 50 MCG/ACT nasal spray Place 1 spray into both nostrils daily as needed for allergies or rhinitis.     glucose blood (ONETOUCH VERIO) test strip USE TO CHECK BLOOD SUGARS 2 TIMES DAILY 200 strip 3   losartan (COZAAR) 100 MG tablet Take 50 mg by mouth daily.     Magnesium 250 MG TABS Take 250 mg by mouth every evening.     metFORMIN (GLUCOPHAGE) 1000 MG tablet TAKE 1 TABLET (1,000 MG TOTAL) BY MOUTH  2 TIMES DAILY WITH A MEAL. (Patient taking differently: Take 1,000 mg by mouth every evening.) 180 tablet 1   OZEMPIC, 1 MG/DOSE, 4 MG/3ML SOPN Inject 1 mg into the skin once a week.     rosuvastatin (CRESTOR) 5 MG tablet TAKE 1 TABLET BY MOUTH EVERY DAY 90 tablet 3   TURMERIC PO Take 1,800 mg by mouth every evening.     Vitamin D-Vitamin K (K2 PLUS D3 PO) Take 1 tablet by mouth every evening.     Zinc 50 MG TABS Take 50 mg by mouth every evening.     sildenafil (REVATIO) 20 MG tablet Take 5 tablets (100 mg total) by mouth daily as needed (relations). 30 tablet 6   clopidogrel (PLAVIX) 75 MG tablet Take 1 tablet (75 mg total) by mouth daily with breakfast. (Patient not taking: Reported on 05/28/2021) 90 tablet 1    metoprolol succinate (TOPROL XL) 25 MG 24 hr tablet Take 0.5 tablets (12.5 mg total) by mouth daily. 45 tablet 3   pantoprazole (PROTONIX) 40 MG tablet TAKE 1 TABLET BY MOUTH EVERY DAY 90 tablet 1   Semaglutide,0.25 or 0.5MG/DOS, (OZEMPIC, 0.25 OR 0.5 MG/DOSE,) 2 MG/1.5ML SOPN Inject 0.5 mg into the skin once a week. To take until 1 mg dose is available 1.5 mL 0   No facility-administered medications prior to visit.     Per HPI unless specifically indicated in ROS section below Review of Systems  Objective:  BP 134/72    Pulse 79    Temp (!) 97.5 F (36.4 C) (Temporal)    Ht '5\' 11"'  (1.803 m)    Wt 230 lb 6 oz (104.5 kg)    SpO2 97%    BMI 32.13 kg/m   Wt Readings from Last 3 Encounters:  07/01/21 230 lb 6 oz (104.5 kg)  05/28/21 232 lb (105.2 kg)  04/30/21 220 lb (99.8 kg)      Physical Exam Vitals and nursing note reviewed.  Constitutional:      Appearance: Normal appearance. He is not ill-appearing.  HENT:     Right Ear: Tympanic membrane and ear canal normal. Decreased hearing noted.     Left Ear: Tympanic membrane and ear canal normal. Decreased hearing noted. A PE tube is present.  Eyes:     Extraocular Movements: Extraocular movements intact.     Conjunctiva/sclera: Conjunctivae normal.     Pupils: Pupils are equal, round, and reactive to light.  Neck:     Comments: FROM cervical neck Cardiovascular:     Rate and Rhythm: Normal rate and regular rhythm.     Pulses: Normal pulses.     Heart sounds: Murmur (2/6 systolic USB) heard.  Pulmonary:     Effort: Pulmonary effort is normal. No respiratory distress.     Breath sounds: Normal breath sounds. No wheezing, rhonchi or rales.  Musculoskeletal:     Cervical back: Normal range of motion and neck supple.     Right lower leg: No edema.     Left lower leg: No edema.     Comments:  L shoulder WNL R shoulder exam: No deformity of shoulders on inspection. Discomfort to palpation posterior R shoulder at bursa  LROM in  abduction and forward flexion past 90 degrees due to pain. No significant pain or weakness with testing SITS in ext/int rotation. No pain with empty can sign. Neg Speed test. No significant impingement. No pain with rotation of humeral head in [facility] joint, significant crepitus present.   Skin:  General: Skin is warm and dry.     Findings: No rash.  Neurological:     Mental Status: He is alert.  Psychiatric:        Mood and Affect: Mood normal.        Behavior: Behavior normal.      Results for orders placed or performed in visit on 65/68/12  Basic Metabolic Panel (BMET)  Result Value Ref Range   Glucose 138 (H) 70 - 99 mg/dL   BUN 11 8 - 27 mg/dL   Creatinine, Ser 0.96 0.76 - 1.27 mg/dL   eGFR 87 >59 mL/min/1.73   BUN/Creatinine Ratio 11 10 - 24   Sodium 140 134 - 144 mmol/L   Potassium 5.1 3.5 - 5.2 mmol/L   Chloride 98 96 - 106 mmol/L   CO2 24 20 - 29 mmol/L   Calcium 9.7 8.6 - 10.2 mg/dL  Magnesium  Result Value Ref Range   Magnesium 2.0 1.6 - 2.3 mg/dL  CBC with Differential/Platelet  Result Value Ref Range   WBC 5.1 3.4 - 10.8 x10E3/uL   RBC 4.61 4.14 - 5.80 x10E6/uL   Hemoglobin 15.0 13.0 - 17.7 g/dL   Hematocrit 45.4 37.5 - 51.0 %   MCV 99 (H) 79 - 97 fL   MCH 32.5 26.6 - 33.0 pg   MCHC 33.0 31.5 - 35.7 g/dL   RDW 13.2 11.6 - 15.4 %   Platelets 241 150 - 450 x10E3/uL   Neutrophils 52 Not Estab. %   Lymphs 34 Not Estab. %   Monocytes 12 Not Estab. %   Eos 1 Not Estab. %   Basos 1 Not Estab. %   Neutrophils Absolute 2.6 1.4 - 7.0 x10E3/uL   Lymphocytes Absolute 1.7 0.7 - 3.1 x10E3/uL   Monocytes Absolute 0.6 0.1 - 0.9 x10E3/uL   EOS (ABSOLUTE) 0.1 0.0 - 0.4 x10E3/uL   Basophils Absolute 0.0 0.0 - 0.2 x10E3/uL   Immature Granulocytes 0 Not Estab. %   Immature Grans (Abs) 0.0 0.0 - 0.1 x10E3/uL    Assessment & Plan:  This visit occurred during the SARS-CoV-2 public health emergency.  Safety protocols were in place, including screening questions prior to the  visit, additional usage of staff PPE, and extensive cleaning of exam room while observing appropriate contact time as indicated for disinfecting solutions.   Problem List Items Addressed This Visit     Controlled diabetes mellitus type 2 with complications [facility])    Appreciate care of endocrinology.  Continue current regimen of ozempic, farxiga, metformin.       Relevant Medications   OZEMPIC, 1 MG/DOSE, 4 MG/3ML SOPN   Essential hypertension    Chronic, well controlled on current regimen.       Relevant Medications   sildenafil (REVATIO) 20 MG tablet   ERECTILE DYSFUNCTION, ORGANIC    Refill sildenafil 38m (1023mdose) to [facility] uses [facility] for this.       Chronic right shoulder pain    Exam today consistent with R shoulder bursitis, no frozen shoulder. rec continue ibuprofen PRN, add ROM exercises, provided with handout for shoulder bursitis from [facility] pt advisor. Update if not improving for outpatient PT referral.       S/P TAVR (transcatheter aortic valve replacement) - Primary   Left ear hearing loss    S/p L ear tube, latest 05/15/2021, without significant benefit. I did suggest he call ENT to touch base on next steps.  Ear tube remains in place without cerumen.  Meds ordered this encounter  Medications   sildenafil (REVATIO) 20 MG tablet    Sig: Take 5 tablets (100 mg total) by mouth daily as needed (relations).    Dispense:  30 tablet    Refill:  6   No orders of the defined types were placed in this encounter.   Patient Instructions  Continue current medicines. I think you have component of right shoulder bursitis. Do exercisees provided today. If no improvement, let me know for referral to outpatient physical therapy.  Good to see you today. Return as needed or in 6 months for wellness visit   Follow up plan: Return in about 6 months (around 12/29/2021) for medicare wellness visit.  Ria Bush, MD

## 2021-07-01 NOTE — Patient Instructions (Signed)
Continue current medicines. I think you have component of right shoulder bursitis. Do exercisees provided today. If no improvement, let me know for referral to outpatient physical therapy.  Good to see you today. Return as needed or in 6 months for wellness visit

## 2021-07-02 ENCOUNTER — Ambulatory Visit: Payer: Medicare Other | Admitting: Family Medicine

## 2021-07-06 IMAGING — CT CT CTA ABD/PEL W/CM AND/OR W/O CM
2 of 7 series · 15 of 36 positions shown · IV contrast (omnipaque)
Comparison: No prior chest CT.  CT of the abdomen 12/18/2016.

CLINICAL DATA: 65-year-old male with history of aortic stenosis.
Preprocedural study prior to potential transcatheter aortic valve
replacement (TAVR) procedure.

EXAM:
CT ANGIOGRAPHY CHEST, ABDOMEN AND PELVIS
TECHNIQUE: Multidetector CT imaging through the chest, abdomen and pelvis was
performed using the standard protocol during bolus administration of
intravenous contrast. Multiplanar reconstructed images and MIPs were
obtained and reviewed to evaluate the vascular anatomy.
CONTRAST:  100mL OMNIPAQUE IOHEXOL 350 MG/ML SOLN

[Series 3: ax thins · axial · 0.59mm/px · z∈[+726,+1382]mm · 14 of 740 slices shown]
[im 42/740  lung]
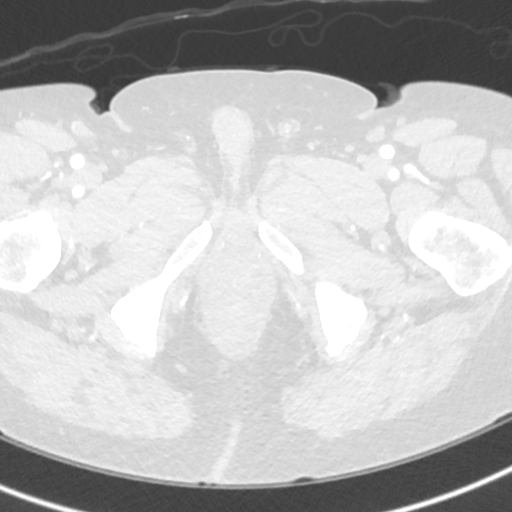
[im 83/740  mediastinal]
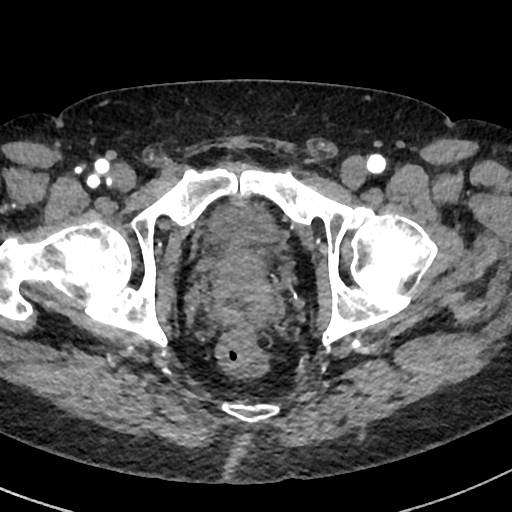
[im 165/740  lung]
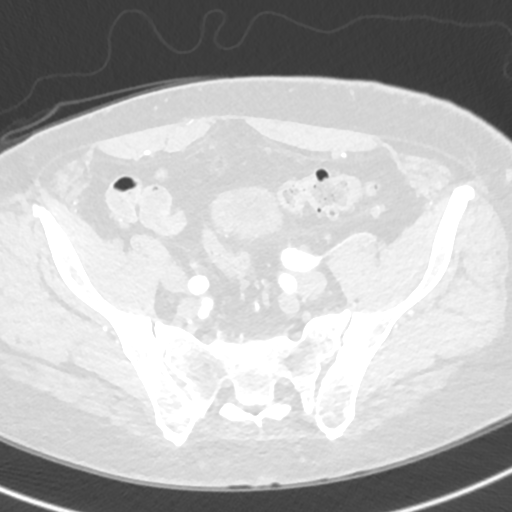
[im 206/740  mediastinal]
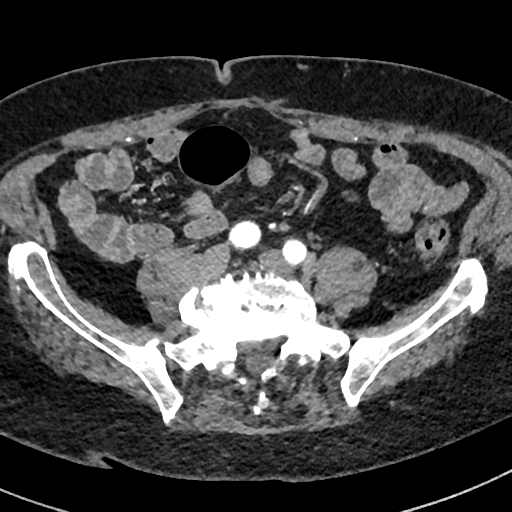
[im 247/740  lung]
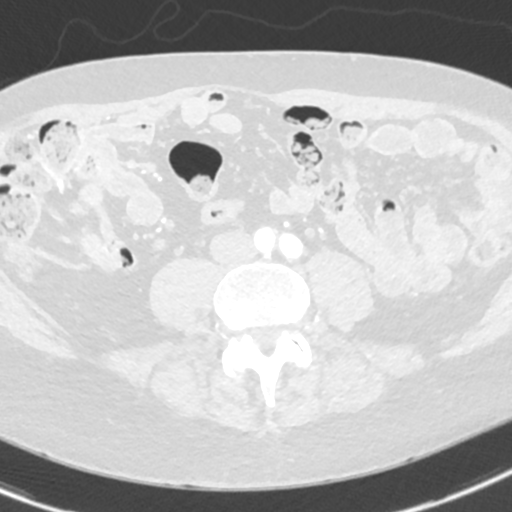
[im 288/740  mediastinal]
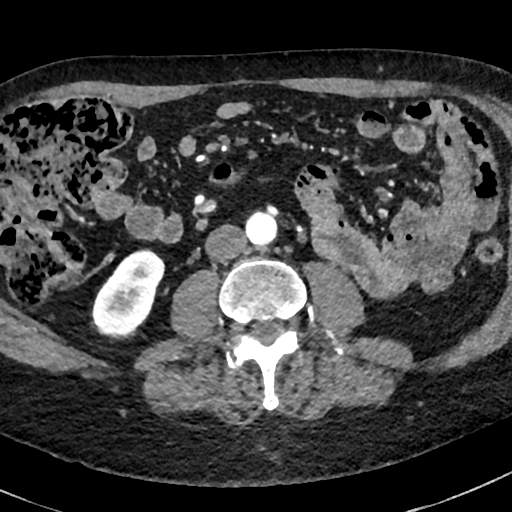
[im 329/740  lung]
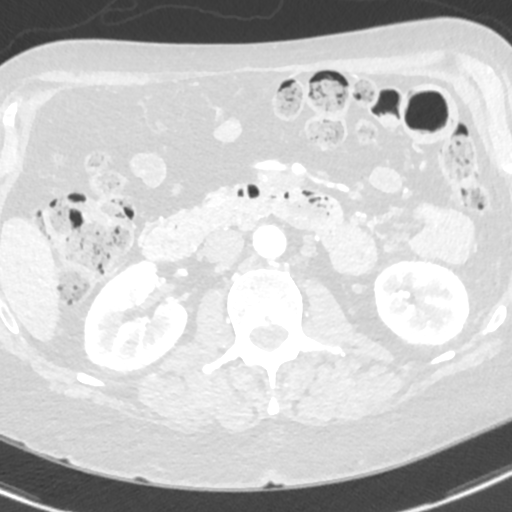
[im 411/740  mediastinal]
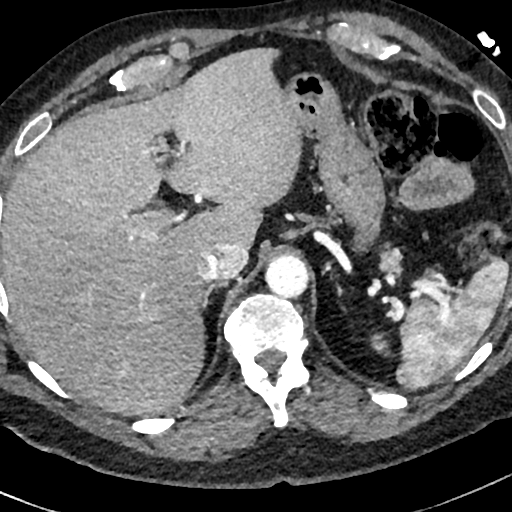
[im 452/740  lung]
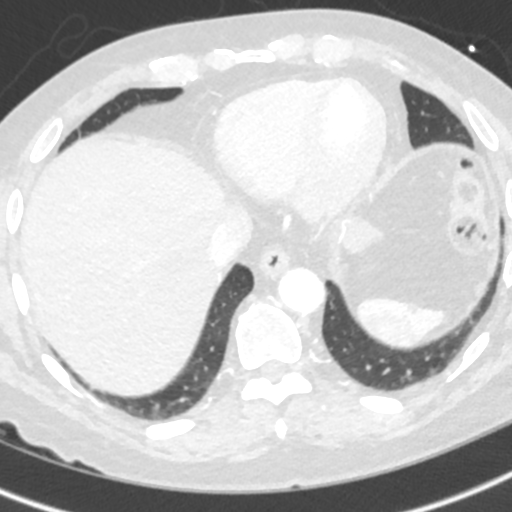
[im 493/740  mediastinal]
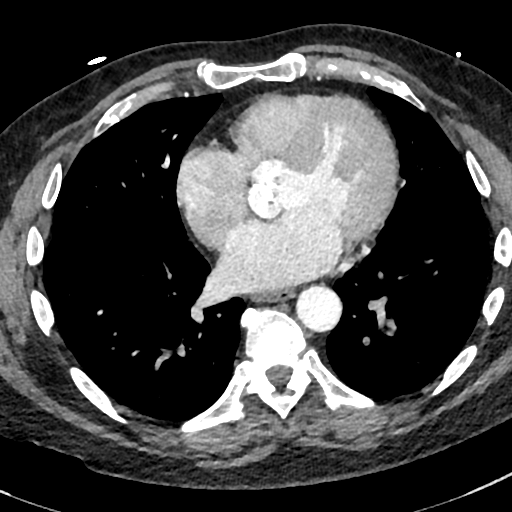
[im 534/740  lung]
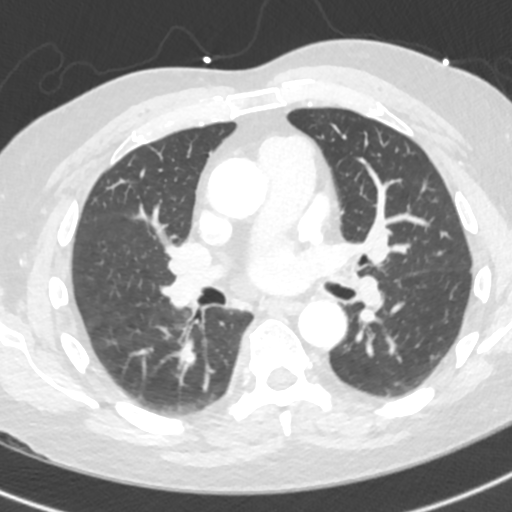
[im 575/740  mediastinal]
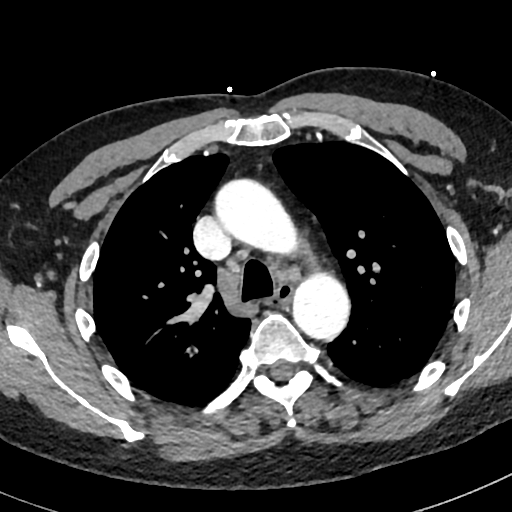
[im 657/740  lung]
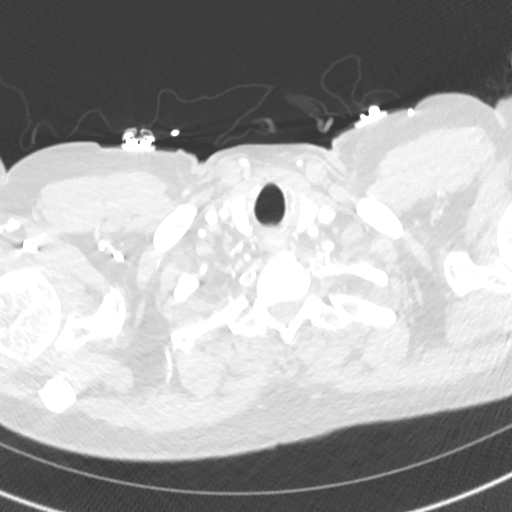
[im 698/740  mediastinal]
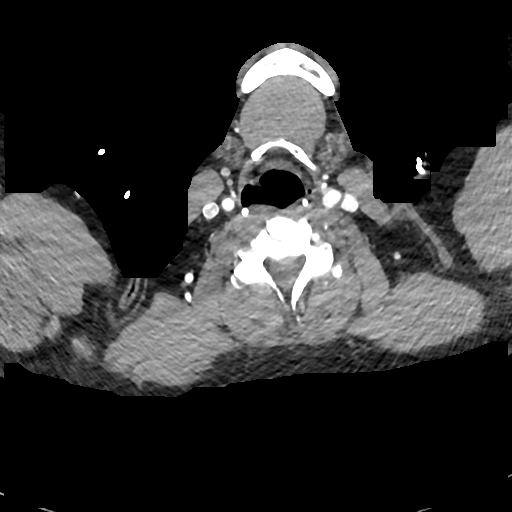

[Series 6: cor · coronal · 0.83mm/px · 1 of 128 slices shown]
[im 64/128  mediastinal]
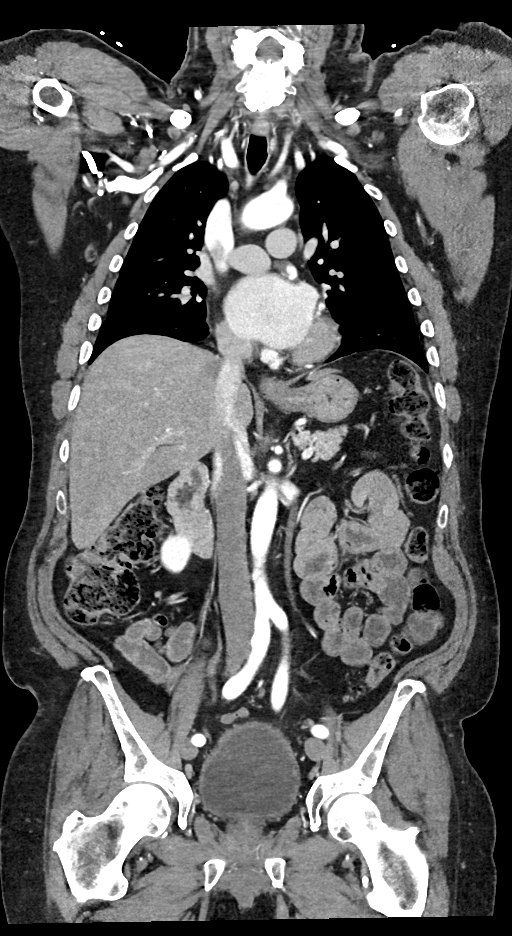

[15 of 36 positions shown; findings below may reference images not displayed]

FINDINGS: CTA CHEST FINDINGS

Cardiovascular: Heart size is normal. There is no significant
pericardial fluid, thickening or pericardial calcification. There is
aortic atherosclerosis, as well as atherosclerosis of the great
vessels of the mediastinum and the coronary arteries, including
calcified atherosclerotic plaque in the left anterior descending and
right coronary arteries. Severe thickening calcification of the
aortic valve.

Mediastinum/Lymph Nodes: No pathologically enlarged mediastinal or
hilar lymph nodes. Esophagus is unremarkable in appearance. No
axillary lymphadenopathy.

Lungs/Pleura: No suspicious appearing pulmonary nodules or masses
are noted. No acute consolidative airspace disease. No pleural
effusions.

Musculoskeletal/Soft Tissues: There are no aggressive appearing
lytic or blastic lesions noted in the visualized portions of the
skeleton.

CTA ABDOMEN AND PELVIS FINDINGS

Hepatobiliary: In the right lobe of the liver there is a 2.3 x
cm hypervascular lesion (axial image 116 of series 4), incompletely
characterized. No other suspicious appearing cystic or solid hepatic
lesions. No intra or extrahepatic biliary ductal dilatation.
Gallbladder is normal in appearance.

Pancreas: No pancreatic mass. No pancreatic ductal dilatation. No
pancreatic or peripancreatic fluid collections or inflammatory
changes.

Spleen: Unremarkable.

Adrenals/Urinary Tract: Bilateral kidneys and the right adrenal
gland are normal in appearance. 1.5 cm left adrenal nodule,
incompletely characterized on today's examination, but similar to
prior examination from 3337, statistically likely to represent a
lipid poor adenoma. No hydroureteronephrosis. Urinary bladder is
normal in appearance.

Stomach/Bowel: The appearance of the stomach is normal. There is no
pathologic dilatation of small bowel or colon. Several colonic
diverticulae are noted, without surrounding inflammatory changes to
suggest an acute diverticulitis at this time. Appendicoliths at the
neck of the appendix. Appendix is otherwise normal in appearance.

Vascular/Lymphatic: Minimal atherosclerotic calcifications in the
abdominal aorta, without evidence of aneurysm or dissection in the
abdominal or pelvic vasculature. Vascular findings and measurements
pertinent to potential TAVR procedure, as detailed below. No
lymphadenopathy noted in the abdomen or pelvis.

Reproductive: Prostate gland and seminal vesicles are unremarkable
in appearance.

Other: No significant volume of ascites.  No pneumoperitoneum.

Musculoskeletal: There are no aggressive appearing lytic or blastic
lesions noted in the visualized portions of the skeleton.

VASCULAR MEASUREMENTS PERTINENT TO TAVR:

AORTA:

Minimal Aortic Piameter-RA x 17 mm

Severity of Aortic Calcification-minimal

RIGHT PELVIS:

Right Common Iliac Artery -

Minimal 8iameter-C1.4 x 12.1 mm

Tortuosity-mild

Calcification-none

Right External Iliac Artery -

Minimal Kiameter-O.O x 10.2 mm

Tortuosity-severe

Calcification-none

Right Common Femoral Artery -

Minimal 7iameter-H.O x 9.7 mm

Tortuosity-mild

Calcification-none

LEFT PELVIS:

Left Common Iliac Artery -

Minimal 0iameter-SS.H x 11.4 mm

Tortuosity-mild

Calcification-none

Left External Iliac Artery -

Minimal 6iameter-1L.2 x 10.4 mm

Tortuosity-moderate

Calcification-none

Left Common Femoral Artery -

Minimal Uiameter-8F.7 x 9.9 mm

Tortuosity-mild

Calcification-none

Review of the MIP images confirms the above findings.
IMPRESSION: 1. Vascular findings and measurements pertinent to potential TAVR
procedure, as detailed above.
2. Severe thickening calcification of the aortic valve, compatible
with reported clinical history of severe aortic stenosis.
3. Aortic atherosclerosis, in addition to 2 vessel coronary artery
disease. Please note that although the presence of coronary artery
calcium documents the presence of coronary artery disease, the
severity of this disease and any potential stenosis cannot be
assessed on this non-gated CT examination. Assessment for potential
risk factor modification, dietary therapy or pharmacologic therapy
may be warranted, if clinically indicated.
4. 2.3 x 1.5 cm hypervascular lesion in the right lobe of the liver,
not evident on prior examination from 3337. This may represent a
benign perfusion anomaly or flash fill cavernous hemangioma,
however, the possibility of an aggressive hypervascular lesion is
not excluded. Definitive characterization with follow-up nonemergent
abdominal MRI with and without IV gadolinium is recommended in the
near future to better evaluate this finding.
5. Colonic diverticulosis without evidence of acute diverticulitis
at this time.
6. Appendicoliths at the neck of the appendix. Currently, the
appendix is otherwise normal in appearance.
7. 1.5 cm left adrenal nodule, similar in retrospect to remote prior
study from 3337, favored to represent a benign lesions such as a
lipid poor adenoma. Attention at time of forthcoming abdominal MRI
is recommended for additional characterization.

## 2021-07-07 ENCOUNTER — Telehealth: Payer: Self-pay

## 2021-07-07 NOTE — Progress Notes (Signed)
Chronic Care Management Pharmacy Assistant   Name: Evan [name]  MRN: [id number] DOB: [birth year]  Reason for Encounter: CCM (Initial Questions)   Recent office visits:  07/01/2021 - Ria Bush, MD - Patient presented for Diabetes. Change: OZEMPIC, 1 MG/DOSE, 4 MG/3ML vs. 0.5 mg. Stop due to patient not taking: clopidogrel (PLAVIX) 75 MG tablet. Stop due to discontinued by provider: metoprolol succinate (TOPROL XL) 25 MG 24 hr tablet. Stop due to completed course: pantoprazole (PROTONIX) 40 MG tablet. Continue all other current medications.   Recent consult visits:  06/30/2021 - Berniece Salines, DO - Cardiology - Patient Message - Stop: Metoprolol due to low heart rate, lightheaded and nauseous.  05/28/2021 - Berniece Salines, DO - Cardiology - Patient presented for chest pressure. Labs: BMP, CBC, Magnesium. Ordered: EKG and Long term monitor (3-14 days). Start: furosemide (LASIX) 20 MG tablet - Take 1 tablet (20 mg total) by mouth 2 (two) times a week. Start: potassium chloride (KLOR-CON) 10 MEQ tablet - Take 1 tablet (10 mEq total) by mouth 2 (two) times a week. 05/15/2021 [name], MD - Otolaryngology - Patient presented for mixed conductive and sensorineural hearing loss of left ear with restricted hearing of right ear and chronic serous otitis media of left ear. 1% xylocaine with epinephrine was injected into the external auditory canals. Cerumen was cleaned out of the ear canals as needed with a curette. A myringotomy incision was created with a myringotomy knife in the posterior/inferior quadrant on the left side. A Donaldson ventilation tube was inserted on the left side. Ciprodex was instilled in the ear canals and a cotton ball was placed in the external meatus.  03/27/2021 - Elayne Snare, MD - Endocrinology - Patient presented for diabetes. Labs: A1c and Microalbumin. Immunizations given: [employer] (high dose 65+).  03/20/2021 - [name], MD - Otolaryngology - Patient presented for  2 week follow up for AS tube placement. No improvement. Follow up for repeat ventilation tube insertion.  02/25/2021 - [place] - Audiology - Patient presented for dysfunction of left eustachian tube: Sensorineural hearing loss, bilateral; Mixed conductive and sensorineural hearing loss of left ear with restricted hearing of right ear. Procedure: Audiometric Testing. Start: Ciprofloxacin-dexamethasone 0.3-0.1% Otic Suspension.  02/14/2021 [name], MD - Otolaryngology - Patient presented for mixed conductive and sensorineural hearing loss of left ear with restricted hearing of right ear and chronic serous otitis media of left ear. 1% xylocaine with epinephrine was injected into the external auditory canals. Cerumen was cleaned out of the ear canals as needed with a curette. A myringotomy incision was created with a myringotomy knife in the posterior/inferior quadrant on the left side. A Donaldson ventilation tube was inserted on the left side. Ciprodex was instilled in the ear canals and a cotton ball was placed in the external meatus.   Hospital visits:  04/30/2021 - [facility] - Patient presented for leg injury. Final Diagnoses: Hematoma of right lower extremity and abrasion of right elbow. No medication changes.  Discharged same day.   Medications: Outpatient Encounter Medications as of 07/07/2021  Medication Sig   amLODipine (NORVASC) 5 MG tablet Take 1 tablet (5 mg total) by mouth daily.   aspirin EC 81 MG tablet Take 81 mg by mouth every evening. Swallow whole.   Blood Glucose Monitoring Suppl (ONETOUCH VERIO FLEX SYSTEM) w/Device KIT 1 kit by Does not apply route daily. Use to check blood sugars 2 times daily   cephALEXin (KEFLEX) 500 MG  capsule Take 4 capsules (2,000 mg) one hour prior to all dental visits.   Cholecalciferol (VITAMIN D3) 50 MCG (2000 UT) TABS Take 2,000 Units by mouth daily.   ciprofloxacin-dexamethasone (CIPRODEX) OTIC suspension    Cyanocobalamin (VITAMIN  B-12) 2500 MCG SUBL Take 2,500 mcg by mouth daily.   FARXIGA 10 MG TABS tablet TAKE 1 TABLET (10 MG TOTAL) BY MOUTH EVERY MORNING.   fluticasone (FLONASE) 50 MCG/ACT nasal spray Place 1 spray into both nostrils daily as needed for allergies or rhinitis.   glucose blood (ONETOUCH VERIO) test strip USE TO CHECK BLOOD SUGARS 2 TIMES DAILY   losartan (COZAAR) 100 MG tablet Take 50 mg by mouth daily.   Magnesium 250 MG TABS Take 250 mg by mouth every evening.   metFORMIN (GLUCOPHAGE) 1000 MG tablet TAKE 1 TABLET (1,000 MG TOTAL) BY MOUTH 2 TIMES DAILY WITH A MEAL. (Patient taking differently: Take 1,000 mg by mouth every evening.)   OZEMPIC, 1 MG/DOSE, 4 MG/3ML SOPN Inject 1 mg into the skin once a week.   rosuvastatin (CRESTOR) 5 MG tablet TAKE 1 TABLET BY MOUTH EVERY DAY   sildenafil (REVATIO) 20 MG tablet Take 5 tablets (100 mg total) by mouth daily as needed (relations).   TURMERIC PO Take 1,800 mg by mouth every evening.   Vitamin D-Vitamin K (K2 PLUS D3 PO) Take 1 tablet by mouth every evening.   Zinc 50 MG TABS Take 50 mg by mouth every evening.   No facility-administered encounter medications on file as of 07/07/2021.   Lab Results  Component Value Date/Time   HGBA1C 6.0 03/25/2021 09:58 AM   HGBA1C 5.9 11/12/2020 09:07 AM   MICROALBUR 0.7 07/11/2020 09:02 AM   MICROALBUR <0.7 09/04/2019 09:51 AM    BP Readings from Last 3 Encounters:  07/01/21 134/72  05/28/21 122/74  04/30/21 (!) 171/94   Patient contacted to review initial questions prior to visit with Charlene Brooke.  Have you seen any other providers since your last visit with PCP? No  Any changes in your medications or health? Yes Stop: Metoprolol due to low heart rate, lightheaded and nauseous.   Any side effects from any medications? No  Do you have an symptoms or problems not managed by your medications? No  Any concerns about your health right now? No  Has your provider asked that you check blood pressure, blood  sugar, or follow special diet at home? Yes Patient   Do you get any type of exercise on a regular basis? Yes - Patient plays Pickleball.   Can you think of a goal you would like to reach for your health? Yes - Patient states he would like to get off some of the medications.   Do you have any problems getting your medications? No  Is there anything that you would like to discuss during the appointment? No  Spoke with patient and reminded them to have all medications, supplements and any blood glucose and blood pressure readings available for review with pharmacist, at their telephone visit on 07/14/2021 at 9:00.    Star Rating Drugs:  Medication:  Last Fill: Day Supply Ozempic 1 mg  06/18/2021 90 Farxiga 10 mg  06/17/2021 30 Rosuvastatin 5 mg 06/20/2021 90 Metformin 1000 mg 06/17/2021 90  Care Gaps: Annual wellness visit in last year? Yes 12/25/2020 Most Recent BP reading: 134/72 on 07/01/2021  If Diabetic: Most recent A1C reading: 6.0 on 03/25/2021  Last eye exam / retinopathy screening: Up to date Last diabetic foot  exam: Up to date  Charlene Brooke, CPP notified  Marijean Niemann, [place] [phone]  Time Spent: 72 Minutes

## 2021-07-11 ENCOUNTER — Encounter: Payer: Self-pay | Admitting: Endocrinology

## 2021-07-12 ENCOUNTER — Encounter: Payer: Self-pay | Admitting: Family Medicine

## 2021-07-14 ENCOUNTER — Other Ambulatory Visit: Payer: Self-pay

## 2021-07-14 ENCOUNTER — Ambulatory Visit (INDEPENDENT_AMBULATORY_CARE_PROVIDER_SITE_OTHER): Payer: Medicare Other | Admitting: Pharmacist

## 2021-07-14 DIAGNOSIS — E1169 Type 2 diabetes mellitus with other specified complication: Secondary | ICD-10-CM

## 2021-07-14 DIAGNOSIS — E785 Hyperlipidemia, unspecified: Secondary | ICD-10-CM

## 2021-07-14 DIAGNOSIS — I10 Essential (primary) hypertension: Secondary | ICD-10-CM

## 2021-07-14 DIAGNOSIS — E118 Type 2 diabetes mellitus with unspecified complications: Secondary | ICD-10-CM

## 2021-07-14 NOTE — Progress Notes (Signed)
Chronic Care Management Pharmacy Note  07/14/2021 Name:  Evan [name] MRN:  [id number] DOB:  December 30, 1955  Summary: -CCM initial visit: pt endorses compliance with medications as prescribed -BP and BG at home is at goal per reported readings  Recommendations/Changes made from today's visit: -No med changes  Plan: -[facility] will call patient 9 months for adherence review -Pharmacist follow up televisit scheduled for 1 year -PCP 51-monthf/u 12/30/21   Subjective: Evan UPCHURCHis an 66y.o. year old male who is a primary patient of [name] [name].  The CCM team was consulted for assistance with disease management and care coordination needs.    Engaged with patient by telephone for initial visit in response to provider referral for pharmacy case management and/or care coordination services.   Consent to Services:  The patient was given the following information about Chronic Care Management services today, agreed to services, and gave verbal consent: 1. CCM service includes personalized support from designated clinical staff supervised by the primary care provider, including individualized plan of care and coordination with other care providers 2. 24/7 contact phone numbers for assistance for urgent and routine care needs. 3. Service will only be billed when office clinical staff spend 20 minutes or more in a month to coordinate care. 4. Only one practitioner may furnish and bill the service in a calendar month. 5.The patient may stop CCM services at any time (effective at the end of the month) by phone call to the office staff. 6. The patient will be responsible for cost sharing (co-pay) of up to 20% of the service fee (after annual deductible is met). Patient agreed to services and consent obtained.  Patient Care Team: [name] [name] as PCP - General (Family Medicine) Evan [name] as PCP - Cardiology (Cardiology) Evan [name] [facility]  Pharmacist (Pharmacist)  Recent office visits: 07/01/2021 - Evan [name] - Patient presented for Diabetes. D/c clopidogrel (completed course), metoprolol (bradycardia), and pantoprazole (completed course). Increased Ozempic to 1 mg (increased per endocrine, updating Rx)  Recent consult visits: 06/30/2021 - Evan [name] - Cardiology - Patient Message - Stop: Metoprolol due to low heart rate, lightheaded and nauseous.  05/28/2021 - Evan [name] - Cardiology - Patient presented for chest pressure. Labs: BMP, CBC, Magnesium. Ordered: EKG and Long term monitor (3-14 days). Start furosemide and KCL twice a week for leg edema. 05/15/2021 -Evan [name] - Otolaryngology - Patient presented for mixed conductive and sensorineural hearing loss of left ear with restricted hearing of right ear and chronic serous otitis media of left ear. 1% xylocaine with epinephrine was injected into the external auditory canals. Cerumen was cleaned out of the ear canals as needed with a curette. A myringotomy incision was created with a myringotomy knife in the posterior/inferior quadrant on the left side. A Donaldson ventilation tube was inserted on the left side. Ciprodex was instilled in the ear canals and a cotton ball was placed in the external meatus.  03/27/2021 - [name] [name] - Endocrinology - Patient presented for diabetes. Labs: A1c and Microalbumin. Immunizations given: FQUALCOMM(high dose 65+). He has stopped AM metformin as instructed. Increase Ozempic to 1 mg. 03/20/2021 - Evan [name] - Otolaryngology - Patient presented for 2 week follow up for AS tube placement. No improvement. Follow up for repeat ventilation tube insertion.  02/25/2021 - Evan [name]- Audiology - Hearing loss. Procedure: Audiometric Testing. Start: Ciprofloxacin-dexamethasone 0.3-0.1% Otic Suspension.  02/14/2021 - Evan [name] - Otolaryngology - Patient presented for mixed conductive and sensorineural hearing  loss  Hospital visits: 04/30/2021 - [facility] - Patient presented for leg injury. Final Diagnoses: Hematoma of right lower extremity and abrasion of right elbow. No medication changes.  Discharged same day.    Objective:  Lab Results  Component Value Date   CREATININE 0.96 05/28/2021   BUN 11 05/28/2021   GFR 89.43 03/25/2021   EGFR 87 05/28/2021   GFRNONAA >60 10/30/2020   GFRAA 105 08/02/2020   NA 140 05/28/2021   K 5.1 05/28/2021   CALCIUM 9.7 05/28/2021   CO2 24 05/28/2021   GLUCOSE 138 (H) 05/28/2021    Lab Results  Component Value Date/Time   HGBA1C 6.0 03/25/2021 09:58 AM   HGBA1C 5.9 11/12/2020 09:07 AM   FRUCTOSAMINE 272 12/25/2020 11:10 AM   FRUCTOSAMINE 224 12/19/2019 08:14 AM   GFR 89.43 03/25/2021 09:58 AM   GFR 89.27 12/25/2020 11:10 AM   MICROALBUR 0.7 07/11/2020 09:02 AM   MICROALBUR <0.7 09/04/2019 09:51 AM    Last diabetic Eye exam:  Lab Results  Component Value Date/Time   HMDIABEYEEXA No Retinopathy 05/26/2021 12:00 AM    Last diabetic Foot exam: No results found for: HMDIABFOOTEX   Lab Results  Component Value Date   CHOL 186 12/25/2020   HDL 91.40 12/25/2020   LDLCALC 82 12/25/2020   LDLDIRECT 99.0 05/20/2015   TRIG 59.0 12/25/2020   CHOLHDL 2 12/25/2020    Hepatic Function Latest Ref Rng & Units 12/25/2020 10/28/2020 07/11/2020  Total Protein 6.0 - 8.3 g/dL 7.3 7.0 7.5  Albumin 3.5 - 5.2 g/dL 4.6 3.7 4.5  AST 0 - 37 U/L _0 ALT 0 - 53 U/L _1 Alk Phosphatase 39 - 117 U/L 46 51 40  Total Bilirubin 0.2 - 1.2 mg/dL 0.5 0.6 0.5  Bilirubin, Direct - - - -    Lab Results  Component Value Date/Time   TSH 2.75 02/18/2016 12:54 PM   TSH 2.72 02/27/2013 09:20 AM    CBC Latest Ref Rng & Units 05/28/2021 10/30/2020 10/29/2020  WBC 3.4 - 10.8 x10E3/uL 5.1 9.0 -  Hemoglobin 13.0 - 17.7 g/dL 15.0 13.0 14.3  Hematocrit 37.5 - 51.0 % 45.4 40.0 42.0  Platelets 150 - 450 x10E3/uL 241 221 -    Lab Results  Component Value  Date/Time   VD25OH 26.51 (L) 02/18/2016 12:54 PM    Clinical ASCVD: No  The 10-year ASCVD risk score (Arnett DK, et al., 2019) is: 25.5%   Values used to calculate the score:     Age: 66 years     Sex: Male     Is Non-Hispanic African American: No     Diabetic: Yes     Tobacco smoker: No     Systolic Blood Pressure: 828 mmHg     Is BP treated: Yes     HDL Cholesterol: 91.4 mg/dL     Total Cholesterol: 186 mg/dL    Depression screen [facility] 2/9 12/25/2020 11/14/2020 12/25/2019  Decreased Interest 0 0 0  Down, Depressed, Hopeless 0 0 0  PHQ - 2 Score 0 0 0  Altered sleeping 0 - -  Tired, decreased energy 0 - -  Change in appetite 0 - -  Feeling bad or failure about yourself  0 - -  Trouble concentrating 0 - -  Moving slowly or fidgety/restless 0 - -  Suicidal thoughts 0 - -  PHQ-9  Score 0 - -  Difficult doing work/chores Not difficult at all - -  Some recent data might be hidden     Social History   Tobacco Use  Smoking Status Never  Smokeless Tobacco Never   BP Readings from Last 3 Encounters:  07/01/21 134/72  05/28/21 122/74  04/30/21 (!) 171/94   Pulse Readings from Last 3 Encounters:  07/01/21 79  05/28/21 82  04/30/21 100   Wt Readings from Last 3 Encounters:  07/01/21 230 lb 6 oz (104.5 kg)  05/28/21 232 lb (105.2 kg)  04/30/21 220 lb (99.8 kg)   BMI Readings from Last 3 Encounters:  07/01/21 32.13 kg/m  05/28/21 32.36 kg/m  04/30/21 30.68 kg/m    Assessment/Interventions: Review of patient past medical history, allergies, medications, health status, including review of consultants reports, laboratory and other test data, was performed as part of comprehensive evaluation and provision of chronic care management services.   SDOH:  (Social Determinants of Health) assessments and interventions performed: Yes SDOH Interventions    Flowsheet Row Most Recent Value  SDOH Interventions   Food Insecurity Interventions Intervention Not Indicated  Financial  Strain Interventions Intervention Not Indicated      SDOH Screenings   Alcohol Screen: Low Risk    Last Alcohol Screening Score (AUDIT): 1  Depression (PHQ2-9): Low Risk    PHQ-2 Score: 0  Financial Resource Strain: Low Risk    Difficulty of Paying Living Expenses: Not hard at all  Food Insecurity: No Food Insecurity   Worried About Charity fundraiser in the Last Year: Never true   Ran Out of Food in the Last Year: Never true  Housing: Low Risk    Last Housing Risk Score: 0  Physical Activity: Sufficiently Active   Days of Exercise per Week: 7 days   Minutes of Exercise per Session: 60 min  Social Connections: Not on file  Stress: No Stress Concern Present   Feeling of Stress : Not at all  Tobacco Use: Low Risk    Smoking Tobacco Use: Never   Smokeless Tobacco Use: Never   Passive Exposure: Not on file  Transportation Needs: No Transportation Needs   Lack of Transportation (Medical): No   Lack of Transportation (Non-Medical): No    CCM Care Plan  Allergies  Allergen Reactions   Penicillins     Respiratory distress as infant    Medications Reviewed Today     Reviewed by Charlton Haws, [facility] (Pharmacist) on 07/14/21 at 0950  Med List Status: <None>   Medication Order Taking? Sig Documenting Provider Last Dose Status Informant  amLODipine (NORVASC) 5 MG tablet [id number] Yes Take 1 tablet (5 mg total) by mouth daily. Eileen Stanford, PA-C Taking Active   aspirin EC 81 MG tablet [id number] Yes Take 81 mg by mouth every evening. Swallow whole. Provider, Historical, [name] Taking Active Self  Blood Glucose Monitoring Suppl (ONETOUCH VERIO FLEX SYSTEM) w/Device KIT [id number] Yes 1 kit by Does not apply route daily. Use to check blood sugars 2 times daily Elayne Snare, [name] Taking Active Self  cephALEXin (KEFLEX) 500 MG capsule [id number] Yes Take 4 capsules (2,000 mg) one hour prior to all dental visits. Eileen Stanford, PA-C Taking Active   Cyanocobalamin (VITAMIN B-12)  2500 MCG SUBL [id number] Yes Take 2,500 mcg by mouth daily. Provider, Historical, [name] Taking Active Self  FARXIGA 10 MG TABS tablet [id number] Yes TAKE 1 TABLET (10 MG TOTAL) BY MOUTH EVERY MORNING. Elayne Snare, [name] Taking Active  fluticasone (FLONASE) 50 MCG/ACT nasal spray [id number] Yes Place 1 spray into both nostrils daily as needed for allergies or rhinitis. Provider, Historical, [name] Taking Active Self  glucose blood (ONETOUCH VERIO) test strip [id number] Yes USE TO CHECK BLOOD SUGARS 2 TIMES DAILY Elayne Snare, [name] Taking Active   losartan (COZAAR) 100 MG tablet [id number] Yes Take 50 mg by mouth daily. Provider, Historical, [name] Taking Active Self  Magnesium 250 MG TABS [id number] Yes Take 250 mg by mouth every evening. Provider, Historical, [name] Taking Active Self  metFORMIN (GLUCOPHAGE) 1000 MG tablet [id number] Yes TAKE 1 TABLET (1,000 MG TOTAL) BY MOUTH 2 TIMES DAILY WITH A MEAL.  Patient taking differently: Take 1,000 mg by mouth every evening.   Elayne Snare, [name] Taking Active Self  OZEMPIC, 1 MG/DOSE, 4 MG/3ML SOPN [id number] Yes Inject 1 mg into the skin once a week. Provider, Historical, [name] Taking Active   rosuvastatin (CRESTOR) 5 MG tablet [id number] Yes TAKE 1 TABLET BY MOUTH EVERY DAY Ria Bush, [name] Taking Active   sildenafil (REVATIO) 20 MG tablet [id number] Yes Take 5 tablets (100 mg total) by mouth daily as needed (relations). Ria Bush, [name] Taking Active   TURMERIC PO [id number] Yes Take 1,800 mg by mouth every evening. Provider, Historical, [name] Taking Active Self  vitamin C (ASCORBIC ACID) 500 MG tablet [id number] Yes Take 500 mg by mouth daily. Provider, Historical, [name] Taking Active   Vitamin D-Vitamin K (K2 PLUS D3 PO) [id number] Yes Take 1 tablet by mouth every evening. Provider, Historical, [name] Taking Active Self  Zinc 50 MG TABS [id number] Yes Take 50 mg by mouth every evening. 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   Rising PSA level 08/20/2020   Severe aortic stenosis    Advanced directives, counseling/discussion 12/21/2018   Seasonal allergic rhinitis 10/07/2017   Hepatic steatosis 12/19/2016   Carotid stenosis 04/04/2016   OSA (obstructive sleep apnea) 02/18/2016   Chronic cough 09/18/2015   NAFLD (nonalcoholic fatty liver disease) 01/2015   Obesity, Class I, BMI 68-12.7 51/70/0174   Lichen simplex chronicus 03/26/2014   GERD (gastroesophageal reflux disease) 12/22/2012   Ophthalmic migraine    Controlled diabetes mellitus type 2 with complications ([place]) 94/49/6759   Hyperlipidemia associated with type 2 diabetes mellitus ([place]) 12/03/2009   MDD (major depressive disorder), recurrent, in full remission ([place]) 12/03/2009   Essential hypertension 12/03/2009   DIVERTICULOSIS, COLON 12/03/2009   ERECTILE DYSFUNCTION, 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   Chronic right shoulder pain 12/03/2009    Immunization History  Administered Date(s) Administered   Fluad Quad(high Dose 65+) 03/27/2019, 03/13/2020, 03/27/2021   Influenza Split 04/26/2012   Influenza Whole 04/22/2010, 03/15/2013   Influenza,inj,Quad PF,6+ Mos 03/26/2014, 05/20/2015, 02/18/2016, 03/15/2017, 03/24/2018   PFIZER(Purple Top)SARS-COV-2 Vaccination 06/21/2019, 07/26/2019, 03/28/2020   Pneumococcal Conjugate-13 12/21/2018   Pneumococcal Polysaccharide-23 12/22/2012, 12/25/2019   Tdap 04/26/2012    Conditions to be addressed/monitored:  Hypertension, Hyperlipidemia, and Diabetes  Care Plan : [place]  Updates made by Charlton Haws, [place] since 07/14/2021 12:00 AM     Problem: Hypertension, Hyperlipidemia, and Diabetes   Priority: High     Long-Range Goal: Disease mgmt   Start Date:  07/14/2021  Expected End Date: 07/14/2022  This Visit's Progress: On track  Priority: High  Note:   Current Barriers:  None identified  Pharmacist Clinical Goal(s):  Patient will contact provider office for questions/concerns as evidenced notation of same in electronic health record through collaboration with PharmD and provider.   Interventions: 1:1 collaboration with Ria Bush, [name] regarding development and update of comprehensive plan of care as evidenced by provider attestation and co-signature Inter-disciplinary care team collaboration (see longitudinal plan of care) Comprehensive medication review performed; medication list updated in electronic medical record  Hypertension (BP goal <140/90) -Controlled - BP is at goal per patient-reported home readings; he endorses compliance with regimen as directed -s/p TAVR 10/2020 -Current treatment: Amlodipine 5 mg daily - Appropriate, Effective, Safe, Accessible Losartan 100 mg daily -Appropriate, Effective, Safe, Accessible -Medications previously tried: metoprolol (bradycardia)  -Current home readings: 120s/76 -Denies hypotensive/hypertensive symptoms -Educated on BP goals and benefits of medications for prevention of heart attack, stroke and kidney damage; -Counseled to monitor BP at home periodically -Recommended to continue current medication  Hyperlipidemia: (LDL goal < 100) -Controlled - LDL is at goal; pt endorses compliance with statin -Hx cath 08/2020 - no evidence of CAD.  -Current treatment: Rosuvastatin 5 mg daily HS -Appropriate, Effective, Safe, Accessible Aspirin 81 mg daily -Appropriate, Effective, Safe, Accessible -Educated on Cholesterol goals; Benefits of statin for ASCVD risk reduction; -Recommended to continue current medication  Diabetes (A1c goal <7%) -Controlled - A1c is at goal; pt endorses compliance with regimen; Ozempic has been on and off back order but he recently got 27-monthsupply of 1 mg  dose -Current medications: Metformin 1000 mg BID (taking QD) Farxiga 1 mg daily Ozempic 1 mg weekly Testing supplies -Medications previously tried: glimepiride  -Current home glucose readings: 130s in AM, 100 ~midday (pt is intermittent fasting and first meal is in evening, so "fasting BG" is actually midday-evening -Denies hypoglycemic/hyperglycemic symptoms -Current meal patterns: intermittent fasting 5 days; low carb (<55 g per day) -Current exercise: pickleball -Educated on A1c and blood sugar goals; -Recommended to continue current medication  Health Maintenance -Vaccine gaps: Shingrix, covid booster -Current therapy:  Vitamin B12 2500 mcg daily Turmeric 1800 mg daily Magnesium 250 mg daily  Vitamin K2 + D3 daily Zinc 50 mg daily Fluticasone nasal spray Vitamin C 500 mg Sildenafil 20 mg PRN -Patient is satisfied with current therapy and denies issues -Recommended to continue current medication  Patient Goals/Self-Care Activities Patient will:  - take medications as prescribed as evidenced by patient report and record review focus on medication adherence by pill box check glucose daily, document, and provide at future appointments check blood pressure daily, document, and provide at future appointments      Medication Assistance: None required.  Patient affirms current coverage meets needs.  Compliance/Adherence/Medication fill history: Care Gaps: None  Star-Rating Drugs: Farxiga - LF 06/17/21 x 30 ds; PDC 56% Metformin - LF 06/17/21 x 90 ds; PDC 16% --pt taking 1/2 of prescribed dose per instructions from Dr KDwyane DeeOzempic - LF 06/18/21 x 90 ds; PDC 78% Rosuvastatin - LF 06/20/21 x 90 ds; [place]100%  Patient's preferred pharmacy is:  [id number] [place], [place] [id number]: [phone]: [phone]), [place] - [address] [place] [place]  ([place] [id number]: [phone]: [phone] pill box? Yes Pt endorses 100% compliance  We discussed: Current pharmacy  is preferred with insurance plan and patient is satisfied with pharmacy services Patient decided to: Continue current medication management strategy  Care Plan and Follow Up Patient Decision:  Patient agrees to Care Plan and Follow-up.  Plan: Telephone follow up appointment with care management team member scheduled for:  1 year  Charlene Brooke, PharmD, [facility] Clinical Pharmacist [place] [facility] at [facility] [phone]

## 2021-07-14 NOTE — Patient Instructions (Signed)
Visit Information  Phone number for Pharmacist: [phone]  Thank you for meeting with me to discuss your medications! I look forward to working with you to achieve your health care goals. Below is a summary of what we talked about during the visit:   Goals Addressed             This Visit's Progress    Track and Manage My Blood Pressure-Hypertension       Timeframe:  Long-Range Goal Priority:  Medium Start Date:      07/14/21                       Expected End Date:  07/14/22                     Follow Up Date Jan 2024   - check blood pressure 3 times per week - choose a place to take my blood pressure (home, clinic or office, retail store) - write blood pressure results in a log or diary    Why is this important?   You won't feel high blood pressure, but it can still hurt your blood vessels.  High blood pressure can cause heart or kidney problems. It can also cause a stroke.  Making lifestyle changes like losing a little weight or eating less salt will help.  Checking your blood pressure at home and at different times of the day can help to control blood pressure.  If the doctor prescribes medicine remember to take it the way the doctor ordered.  Call the office if you cannot afford the medicine or if there are questions about it.     Notes:         Care Plan : [place]  Updates made by Charlton Haws, RPH since 07/14/2021 12:00 AM     Problem: Hypertension, Hyperlipidemia, and Diabetes   Priority: High     Long-Range Goal: Disease mgmt   Start Date: 07/14/2021  Expected End Date: 07/14/2022  This Visit's Progress: On track  Priority: High  Note:   Current Barriers:  None identified  Pharmacist Clinical Goal(s):  Patient will contact provider office for questions/concerns as evidenced notation of same in electronic health record through collaboration with PharmD and provider.   Interventions: 1:1 collaboration with Ria Bush, MD  regarding development and update of comprehensive plan of care as evidenced by provider attestation and co-signature Inter-disciplinary care team collaboration (see longitudinal plan of care) Comprehensive medication review performed; medication list updated in electronic medical record  Hypertension (BP goal <140/90) -Controlled - BP is at goal per patient-reported home readings; he endorses compliance with regimen as directed -s/p TAVR 10/2020 -Current treatment: Amlodipine 5 mg daily - Appropriate, Effective, Safe, Accessible Losartan 100 mg daily -Appropriate, Effective, Safe, Accessible -Medications previously tried: metoprolol (bradycardia)  -Current home readings: 120s/76 -Denies hypotensive/hypertensive symptoms -Educated on BP goals and benefits of medications for prevention of heart attack, stroke and kidney damage; -Counseled to monitor BP at home periodically -Recommended to continue current medication  Hyperlipidemia: (LDL goal < 100) -Controlled - LDL is at goal; pt endorses compliance with statin -Hx cath 08/2020 - no evidence of CAD.  -Current treatment: Rosuvastatin 5 mg daily HS -Appropriate, Effective, Safe, Accessible Aspirin 81 mg daily -Appropriate, Effective, Safe, Accessible -Educated on Cholesterol goals; Benefits of statin for ASCVD risk reduction; -Recommended to continue current medication  Diabetes (A1c goal <7%) -Controlled - A1c is at goal; pt endorses  compliance with regimen; Ozempic has been on and off back order but he recently got 63-month supply of 1 mg dose -Current medications: Metformin 1000 mg BID (taking QD) Farxiga 1 mg daily Ozempic 1 mg weekly Testing supplies -Medications previously tried: glimepiride  -Current home glucose readings: 130s in AM, 100 ~midday (pt is intermittent fasting and first meal is in evening, so "fasting BG" is actually midday-evening -Denies hypoglycemic/hyperglycemic symptoms -Current meal patterns: intermittent  fasting 5 days; low carb (<55 g per day) -Current exercise: pickleball -Educated on A1c and blood sugar goals; -Recommended to continue current medication  Health Maintenance -Vaccine gaps: Shingrix, covid booster -Current therapy:  Vitamin B12 2500 mcg daily Turmeric 1800 mg daily Magnesium 250 mg daily  Vitamin K2 + D3 daily Zinc 50 mg daily Fluticasone nasal spray Vitamin C 500 mg Sildenafil 20 mg PRN -Patient is satisfied with current therapy and denies issues -Recommended to continue current medication  Patient Goals/Self-Care Activities Patient will:  - take medications as prescribed as evidenced by patient report and record review focus on medication adherence by pill box check glucose daily, document, and provide at future appointments check blood pressure daily, document, and provide at future appointments      Mr. Provencio was given information about Chronic Care Management services today including:  CCM service includes personalized support from designated clinical staff supervised by his physician, including individualized plan of care and coordination with other care providers 24/7 contact phone numbers for assistance for urgent and routine care needs. Standard insurance, coinsurance, copays and deductibles apply for chronic care management only during months in which we provide at least 20 minutes of these services. Most insurances cover these services at 100%, however patients may be responsible for any copay, coinsurance and/or deductible if applicable. This service may help you avoid the need for more expensive face-to-face services. Only one practitioner may furnish and bill the service in a calendar month. The patient may stop CCM services at any time (effective at the end of the month) by phone call to the office staff.  Patient agreed to services and verbal consent obtained.   Patient verbalizes understanding of instructions and care plan provided today and  agrees to view in [place]. Active MyChart status confirmed with patient.   Telephone follow up appointment with pharmacy team member scheduled for: 1 year  Charlene Brooke, PharmD, [facility] Clinical Pharmacist [place] [facility] at [facility] [phone]

## 2021-07-15 ENCOUNTER — Encounter: Payer: Self-pay | Admitting: Family Medicine

## 2021-07-15 DIAGNOSIS — I10 Essential (primary) hypertension: Secondary | ICD-10-CM

## 2021-07-15 DIAGNOSIS — E785 Hyperlipidemia, unspecified: Secondary | ICD-10-CM | POA: Diagnosis not present

## 2021-07-15 DIAGNOSIS — E1159 Type 2 diabetes mellitus with other circulatory complications: Secondary | ICD-10-CM | POA: Diagnosis not present

## 2021-07-15 DIAGNOSIS — Z7984 Long term (current) use of oral hypoglycemic drugs: Secondary | ICD-10-CM | POA: Diagnosis not present

## 2021-07-15 DIAGNOSIS — Z7985 Long-term (current) use of injectable non-insulin antidiabetic drugs: Secondary | ICD-10-CM | POA: Diagnosis not present

## 2021-07-15 NOTE — Telephone Encounter (Signed)
Plz schedule OV with Dr. Lorelei Pont to discuss steroid injection for ongoing right shoulder pain.

## 2021-07-15 NOTE — Telephone Encounter (Signed)
Noted  

## 2021-07-15 NOTE — Telephone Encounter (Signed)
Called Mr. Bechtol and got him scheduled for 2/1 @1130 

## 2021-07-16 ENCOUNTER — Ambulatory Visit (INDEPENDENT_AMBULATORY_CARE_PROVIDER_SITE_OTHER)
Admission: RE | Admit: 2021-07-16 | Discharge: 2021-07-16 | Disposition: A | Discharge status: Home / Self Care | Payer: Medicare Other | Source: Ambulatory Visit | Attending: Family Medicine | Admitting: Family Medicine

## 2021-07-16 ENCOUNTER — Other Ambulatory Visit: Payer: Self-pay

## 2021-07-16 ENCOUNTER — Encounter: Payer: Self-pay | Admitting: Family Medicine

## 2021-07-16 ENCOUNTER — Ambulatory Visit (INDEPENDENT_AMBULATORY_CARE_PROVIDER_SITE_OTHER): Payer: Medicare Other | Admitting: Family Medicine

## 2021-07-16 VITALS — BP 114/70 | HR 59 | Temp 97.7°F | Ht 71.0 in | Wt 230.5 lb

## 2021-07-16 DIAGNOSIS — G8929 Other chronic pain: Secondary | ICD-10-CM

## 2021-07-16 DIAGNOSIS — M19011 Primary osteoarthritis, right shoulder: Secondary | ICD-10-CM | POA: Diagnosis not present

## 2021-07-16 DIAGNOSIS — M25611 Stiffness of right shoulder, not elsewhere classified: Secondary | ICD-10-CM

## 2021-07-16 DIAGNOSIS — M25511 Pain in right shoulder: Secondary | ICD-10-CM | POA: Diagnosis not present

## 2021-07-16 MED ORDER — TRIAMCINOLONE ACETONIDE 40 MG/ML IJ SUSP
40.0000 mg | Freq: Once | INTRAMUSCULAR | Status: AC
  Administered 2021-07-16: 40 mg via INTRA_ARTICULAR

## 2021-07-16 MED ORDER — MELOXICAM 15 MG PO TABS: 15.0000 mg | ORAL_TABLET | Freq: Every day | ORAL | 2 refills | Status: DC

## 2021-07-16 NOTE — Patient Instructions (Signed)
At any point, if you want to talk to one of the shoulder surgeons, please let me know.

## 2021-07-16 NOTE — Progress Notes (Signed)
° ° °Spencer T. Copland, MD, CAQ Sports Medicine °[facility] °[address] °[place], [address] ° °Phone: [phone]   FAX: [phone] ° °Evan [name] - 66 y.o. male   MRN [id number]   Date of Birth: 09/09/1955 ° °Date: 07/16/2021   PCP: Gutierrez, Javier, MD   Referral: Gutierrez, Javier, MD ° °Chief Complaint  °Patient presents with  °• Shoulder Pain  °  Right  ° ° °This visit occurred during the SARS-CoV-2 public health emergency.  Safety protocols were in place, including screening questions prior to the visit, additional usage of staff PPE, and extensive cleaning of exam room while observing appropriate contact time as indicated for disinfecting solutions.  ° °Subjective:  ° °Evan [name] is a 66 y.o. very pleasant male patient with Body mass index is 32.15 kg/m². who presents with the following: ° °Chronic R shoulder:  R shoulder has been bothering him off and on for several years.  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  He has fairly dramatic loss of motion in all directions with the right shoulder.  He has had no event or injury that he can recall.  He does certainly have pain with abduction, internal range of motion, as well as flexion. ° °No significant prior traumatic injury. ° °Significant prior medical history includes diabetes mellitus for 10 years. ° °Lab Results  °Component Value Date  ° HGBA1C 6.0 03/25/2021  °  ° ° °Review of Systems is noted in the HPI, as appropriate  ° °Objective:  ° °BP 114/70    Pulse (!) 59    Temp 97.7 °F (36.5 °C) (Temporal)    Ht 5' 11" (1.803 m)    Wt 230 lb 8 oz (104.6 kg)    SpO2 99%    BMI 32.15 kg/m²  ° ° °Shoulder: R and L °Inspection: No muscle wasting or winging °Ecchymosis/edema: neg  °AC joint, scapula, clavicle: NT °Cervical spine: NT, full ROM °Spurling's: neg °ABNORMAL SIDE TESTED:  Right °UNLESS OTHERWISE NOTED, THE CONTRALATERAL SIDE HAS FULL RANGE OF MOTION. °Abduction: 5/5, LIMITED TO 95 DEGREES °Flexion: 5/5, LIMITED TO 100 DEGNO ROM  °IR, lift-off: 5/5. TESTED AT 90 DEGREES OF ABDUCTION, LIMITED TO 5 DEGREES °ER at neutral:  5/5, TESTED AT 90 DEGREES OF ABDUCTION, LIMITED TO 15 DEGREES °AC crossover and compression: PAIN °Drop Test: neg °Empty Can: neg °Supraspinatus insertion: NT °Bicipital groove: NT °ALL OTHER SPECIAL TESTING EQUIVOCAL GIVEN LOSS OF MOTION °C5-T1 intact °Sensation intact °Grip 5/5   ° °Radiology: ° °The radiological images were independently reviewed by myself in the office and results were reviewed with the patient. My independent interpretation of images:   °Xrays: Shoulder series °Indication: shoulder pain, loss of motion °Findings: No evidence of occult fracture °Moderate to severe glenohumeral arthritis °AC joint: extremely large bone growth throughout the acromium and clavicle with a near fusion of the ac joint. °Impingement pathology: extensive bone on the undersurface of the AC joint. °Electronically Signed  By: Spencer Copland, MD On: 07/16/2021 11:20 AM EST  ° °I agree with radiological interpretation. °DG Shoulder Right ° °Result Date: 07/16/2021 °CLINICAL DATA:  Right shoulder pain for several years. Loss of motion. Assess glenohumeral osteoarthritis. EXAM: RIGHT SHOULDER - 2+ VIEW COMPARISON:  None. FINDINGS: Severe acromioclavicular joint space narrowing and peripheral osteophytosis. Moderate to severe glenohumeral joint space narrowing and subchondral   sclerosis. Mild-to-moderate inferior glenoid and humeral head degenerative osteophytosis. No acute fracture or dislocation. The visualized portion of the right lung is unremarkable. IMPRESSION: Severe acromioclavicular and moderate to severe glenohumeral osteoarthritis. Electronically Signed   By: Ronald  Viola M.D.   On: 07/16/2021 18:56    ° ° °Assessment and Plan:  ° °  ICD-10-CM   °1. Primary  osteoarthritis, right shoulder  M19.011 triamcinolone acetonide (KENALOG-40) injection 40 mg  °  °2. Chronic right shoulder pain  M25.511 DG Shoulder Right  ° G89.29   °  °3. Decreased range of motion of right shoulder  M25.611   °  ° °The patient has severe loss of motion and pain is due to extensive osteoarthritis. °Moderate to severe glenohumeral arthritis, but the acromioclavicular joint has extremely severe arthritis with bone formation throughout the AC joint and extensive bone formation on the undersurface of the acromioclavicular joint. ° °The patient may be able to get a small degree of motion back, but I would anticipate that his loss of motion and pain will be indefinite. ° °I am going to give him a shoulder injection today, and hopefully this will give him some symptomatic relief and he may be able to get some motion with the frozen shoulder type protocol. ° °We discussed that at any point I am happy to refer him to one of the shoulder subspecialist, and I am doubtful that anything short of a shoulder arthroplasty would give him longstanding relief of pain and motion. ° °Intraarticular Shoulder Aspiration/Injection Procedure Note °Evan [name] °06/30/1955 °Date of procedure: 07/16/2021 ° °Procedure: Large Joint Aspiration / Injection of Shoulder, Intraarticular, R °Indications: Pain ° °Procedure Details °Verbal consent was obtained from the patient. Risks explained and contrasted with benefits and alternatives. Patient prepped with Chloraprep and Ethyl Chloride used for anesthesia. An intraarticular shoulder injection was performed using the posterior approach; needle placed into joint capsule without difficulty. The patient tolerated the procedure well and had decreased pain post injection. No complications. °Injection: 9 cc of Lidocaine 1% and 1 mL Kenalog 40 mg. °Needle: 21 gauge, 2 inch  ° °Meds ordered this encounter  °Medications  °• meloxicam (MOBIC) 15 MG tablet  °  Sig: Take 1 tablet (15 mg  total) by mouth daily.  °  Dispense:  30 tablet  °  Refill:  2  °• triamcinolone acetonide (KENALOG-40) injection 40 mg  ° °There are no discontinued medications. °Orders Placed This Encounter  °Procedures  °• DG Shoulder Right  ° ° °Follow-up: No follow-ups on file. ° °Dr[employer] One speech-to-text software was used for transcription in this dictation.  Possible transcriptional errors can occur using Dragon software.  ° °Signed, ° °Spencer T. Copland, MD ° ° °Outpatient Encounter Medications as of 07/16/2021  °Medication Sig  °• amLODipine (NORVASC) 5 MG tablet Take 1 tablet (5 mg total) by mouth daily.  °• aspirin EC 81 MG tablet Take 81 mg by mouth every evening. Swallow whole.  °• Blood Glucose Monitoring Suppl (ONETOUCH VERIO FLEX SYSTEM) w/Device KIT 1 kit by Does not apply route daily. Use to check blood sugars 2 times daily  °• cephALEXin (KEFLEX) 500 MG capsule Take 4 capsules (2,000 mg) one hour prior to all dental visits.  °• Cyanocobalamin (VITAMIN B-12) 2500 MCG SUBL Take 2,500 mcg by mouth daily.  °• FARXIGA 10 MG TABS tablet TAKE 1 TABLET (10 MG TOTAL) BY MOUTH EVERY MORNING.  °• fluticasone (FLONASE) 50 MCG/ACT nasal spray Place 1 spray into both   into both nostrils daily as needed for allergies or rhinitis.   glucose blood (ONETOUCH VERIO) test strip USE TO CHECK BLOOD SUGARS 2 TIMES DAILY   losartan (COZAAR) 100 MG tablet Take 50 mg by mouth daily.   Magnesium 250 MG TABS Take 250 mg by mouth every evening.   meloxicam (MOBIC) 15 MG tablet Take 1 tablet (15 mg total) by mouth daily.   metFORMIN (GLUCOPHAGE) 1000 MG tablet TAKE 1 TABLET (1,000 MG TOTAL) BY MOUTH 2 TIMES DAILY WITH A MEAL. (Patient taking differently: Take 1,000 mg by mouth every evening.)   OZEMPIC, 1 MG/DOSE, 4 MG/3ML SOPN Inject 1 mg into the skin once a week.   rosuvastatin (CRESTOR) 5 MG tablet TAKE 1 TABLET BY MOUTH EVERY DAY   sildenafil (REVATIO) 20 MG tablet Take 5 tablets (100 mg total) by mouth daily as needed  (relations).   TURMERIC PO Take 1,800 mg by mouth every evening.   vitamin C (ASCORBIC ACID) 500 MG tablet Take 500 mg by mouth daily.   Vitamin D-Vitamin K (K2 PLUS D3 PO) Take 1 tablet by mouth every evening.   Zinc 50 MG TABS Take 50 mg by mouth every evening.   [EXPIRED] triamcinolone acetonide (KENALOG-40) injection 40 mg    No facility-administered encounter medications on file as of 07/16/2021.

## 2021-07-17 ENCOUNTER — Encounter: Payer: Self-pay | Admitting: Family Medicine

## 2021-07-17 DIAGNOSIS — M19011 Primary osteoarthritis, right shoulder: Secondary | ICD-10-CM

## 2021-07-29 ENCOUNTER — Other Ambulatory Visit (INDEPENDENT_AMBULATORY_CARE_PROVIDER_SITE_OTHER): Payer: Medicare Other

## 2021-07-29 ENCOUNTER — Other Ambulatory Visit: Payer: Self-pay

## 2021-07-29 ENCOUNTER — Other Ambulatory Visit: Payer: Medicare Other

## 2021-07-29 DIAGNOSIS — E669 Obesity, unspecified: Secondary | ICD-10-CM | POA: Diagnosis not present

## 2021-07-29 DIAGNOSIS — E1169 Type 2 diabetes mellitus with other specified complication: Secondary | ICD-10-CM

## 2021-07-30 ENCOUNTER — Other Ambulatory Visit: Payer: Self-pay | Admitting: Endocrinology

## 2021-07-30 DIAGNOSIS — E1169 Type 2 diabetes mellitus with other specified complication: Secondary | ICD-10-CM

## 2021-07-30 LAB — MICROALBUMIN / CREATININE URINE RATIO
Creatinine,U: 45.5 mg/dL
Microalb Creat Ratio: 1.5 mg/g (ref 0.0–30.0)
Microalb, Ur: <0.7 mg/dL (ref 0.0–1.9)

## 2021-07-30 LAB — HEMOGLOBIN A1C: Hgb A1c MFr Bld: 6.8 % — ABNORMAL HIGH (ref 4.6–6.5)

## 2021-07-31 LAB — COMPREHENSIVE METABOLIC PANEL
ALT: 16 U/L (ref 0–53)
AST: 14 U/L (ref 0–37)
Albumin: 4.6 g/dL (ref 3.5–5.2)
Alkaline Phosphatase: 47 U/L (ref 39–117)
BUN: 18 mg/dL (ref 6–23)
CO2: 32 mEq/L (ref 19–32)
Calcium: 9.8 mg/dL (ref 8.4–10.5)
Chloride: 100 mEq/L (ref 96–112)
Creatinine, Ser: 1.05 mg/dL (ref 0.40–1.50)
GFR: 73.14 mL/min (ref >60.00)
Glucose, Bld: 125 mg/dL — ABNORMAL HIGH (ref 70–99)
Potassium: 4.5 mEq/L (ref 3.5–5.1)
Sodium: 140 mEq/L (ref 135–145)
Total Bilirubin: 0.5 mg/dL (ref 0.2–1.2)
Total Protein: 7.3 g/dL (ref 6.0–8.3)

## 2021-08-01 ENCOUNTER — Encounter: Payer: Self-pay | Admitting: Endocrinology

## 2021-08-01 ENCOUNTER — Other Ambulatory Visit: Payer: Self-pay

## 2021-08-01 ENCOUNTER — Ambulatory Visit (INDEPENDENT_AMBULATORY_CARE_PROVIDER_SITE_OTHER): Payer: Medicare Other | Admitting: Endocrinology

## 2021-08-01 VITALS — BP 112/74 | HR 74 | Ht 71.0 in | Wt 228.8 lb

## 2021-08-01 DIAGNOSIS — I10 Essential (primary) hypertension: Secondary | ICD-10-CM | POA: Diagnosis not present

## 2021-08-01 DIAGNOSIS — E1165 Type 2 diabetes mellitus with hyperglycemia: Secondary | ICD-10-CM

## 2021-08-01 NOTE — Patient Instructions (Signed)
Take extra 1/2 Metformin at bedtime and keep am sugar 120

## 2021-08-01 NOTE — Progress Notes (Signed)
Patient ID: Evan [name], male   DOB: 01/03/1956, 66 y.o.   MRN: [id number]          Reason for Appointment:Type 2 Diabetes and related problems   History of Present Illness:          Date of diagnosis of type 2 diabetes mellitus: 2012        Background history:   Diagnosed incidentally with a high blood sugar Has been on metformin since diagnosis and subsequently Amaryl had been added He has had sporadic follow-up for the last few years His A1c had been below 7 to about 08/2015 Subsequently in 9/17 his A1c had gone up to 8%  Recent history:      Non-insulin hypoglycemic drugs : Farxiga 10 mg,  metformin 1 g at dinner, Ozempic 1 mg weekly   His A1c is slightly higher at 6.8 compared to 6%   Current management, blood sugar patterns and problems identified: He was last seen in 10/22  On his last visit he was advised to continue 1 mg of Ozempic since with reducing this his weight had gone up 11 pounds  Since then his weight has leveled off  However he was not able to get Ozempic because of supply issues for about 2 months until 6 weeks ago  With this he appears to be having relatively higher fasting readings although checked less frequently now  He says he is still trying to lose weight and now only eating 1 meal a day in the evening except on weekends  Currently taking metformin only in the evening He is also trying to walk up to 6 miles about 5 days a week  Blood sugars were not reviewed from meter download on the last visit Takes Farxiga regularly without side effects  Meals: Usually skipping breakfast, dinner is usually a protein with vegetables or salad, low-fat intake       Side effects from medications have been: None  Glucose monitoring:  done 1 or less times a day         Glucometer: One Touch Verio  .      Blood Glucose readings by review of monitor as above   PRE-MEAL Fasting Lunch Dinner Bedtime Overall  Glucose range: 122-163 99-137   99-163  Mean/median:  140    129   POST-MEAL PC Breakfast PC Lunch PC Dinner  Glucose range:   117-162  Mean/median:      Previously:   PRE-MEAL Fasting Lunch Dinner Bedtime Overall  Glucose range:  122  95-123  126    Mean/median:   115   120   POST-MEAL PC Breakfast PC Lunch PC Dinner  Glucose range:    118, 141  Mean/median:       Self-care: The diet that the patient has been following is: tries to reduce high-fat and fried foods, Carbohydrates and avoiding drinks with sugar .       Dietician visit, most recent: 02/2017  Weight history: Highest previous weight 287 in 2019  Wt Readings from Last 3 Encounters:  08/01/21 228 lb 12.8 oz (103.8 kg)  07/16/21 230 lb 8 oz (104.6 kg)  07/01/21 230 lb 6 oz (104.5 kg)    Glycemic control:   Lab Results  Component Value Date   HGBA1C 6.8 (H) 07/29/2021   HGBA1C 6.0 03/25/2021   HGBA1C 5.9 11/12/2020   Lab Results  Component Value Date   MICROALBUR <0.7 07/29/2021   LDLCALC 82 12/25/2020   CREATININE 1.05 07/30/2021  Lab Results  Component Value Date   MICRALBCREAT 1.5 07/29/2021    Lab Results  Component Value Date   FRUCTOSAMINE 272 12/25/2020   FRUCTOSAMINE 224 12/19/2019   FRUCTOSAMINE 239 12/13/2017      Allergies as of 08/01/2021       Reactions   Penicillins    Respiratory distress as infant        Medication List        Accurate as of August 01, 2021  3:25 PM. If you have any questions, ask your nurse or doctor.          amLODipine 5 MG tablet Commonly known as: NORVASC Take 1 tablet (5 mg total) by mouth daily.   aspirin EC 81 MG tablet Take 81 mg by mouth every evening. Swallow whole.   cephALEXin 500 MG capsule Commonly known as: Keflex Take 4 capsules (2,000 mg) one hour prior to all dental visits.   Farxiga 10 MG Tabs tablet Generic drug: dapagliflozin propanediol TAKE 1 TABLET (10 MG TOTAL) BY MOUTH EVERY MORNING.   fluticasone 50 MCG/ACT nasal spray Commonly known as: FLONASE Place 1  spray into both nostrils daily as needed for allergies or rhinitis.   K2 PLUS D3 PO Take 1 tablet by mouth every evening.   losartan 100 MG tablet Commonly known as: COZAAR Take 50 mg by mouth daily.   Magnesium 250 MG Tabs Take 250 mg by mouth every evening.   meloxicam 15 MG tablet Commonly known as: MOBIC Take 1 tablet (15 mg total) by mouth daily.   metFORMIN 1000 MG tablet Commonly known as: GLUCOPHAGE TAKE 1 TABLET (1,000 MG TOTAL) BY MOUTH 2 TIMES DAILY WITH A MEAL. What changed: See the new instructions.   OneTouch Verio Flex System w/Device Kit 1 kit by Does not apply route daily. Use to check blood sugars 2 times daily   OneTouch Verio test strip Generic drug: glucose blood USE TO CHECK BLOOD SUGARS 2 TIMES DAILY   Ozempic (1 MG/DOSE) 4 MG/3ML Sopn Generic drug: Semaglutide (1 MG/DOSE) Inject 1 mg into the skin once a week.   rosuvastatin 5 MG tablet Commonly known as: CRESTOR TAKE 1 TABLET BY MOUTH EVERY DAY   sildenafil 20 MG tablet Commonly known as: REVATIO Take 5 tablets (100 mg total) by mouth daily as needed (relations).   TURMERIC PO Take 1,800 mg by mouth every evening.   Vitamin B-12 2500 MCG Subl Take 2,500 mcg by mouth daily.   vitamin C 500 MG tablet Commonly known as: ASCORBIC ACID Take 500 mg by mouth daily.   Zinc 50 MG Tabs Take 50 mg by mouth every evening.        Allergies:  Allergies  Allergen Reactions   Penicillins     Respiratory distress as infant    Past Medical History:  Diagnosis Date   Adrenal adenoma    Complication of anesthesia    difficulty to weak up   COVID-19 virus infection 05/2020   Depression    intolerant of cymbalta and wellbutrin   Diverticulosis of colon    Esophagitis 03/2015   by EGD   Gastritis 03/2015   by EGD - mild, chronic   Hyperlipidemia    Hypertension    Left ventricular hypertrophy    Lichen simplex chronicus 05/2015   R scrotum [name])   Liver lesion    2022 CTs: There  was an incidental finding of a hypervascular lesion in the right lobe of the liver as well  as left adrenal nodule. He underwent MRI of the abdomen showing the appearance of the liver favored focal nodular hyperplasia, as well as small left adrenal adenoma.   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 Angelena Form & Dr. Cyndia Bent    Severe aortic stenosis    T2DM (type 2 diabetes mellitus) ([place])    DSME 09/2013    Past Surgical History:  Procedure Laterality Date   COLONOSCOPY  2006   diverticulosis per pt   COLONOSCOPY  03/2015   2 polyps, rpt 5 yrs [name])   ESOPHAGOGASTRODUODENOSCOPY  03/2015   mild chronic gastritis, esophagitis    INTRAOPERATIVE TRANSTHORACIC ECHOCARDIOGRAM Left 10/29/2020   Procedure: INTRAOPERATIVE TRANSTHORACIC ECHOCARDIOGRAM;  Surgeon: Burnell Blanks, MD;  Location: [facility];  Service: Open Heart Surgery;  Laterality: Left;   LUMBAR LAMINECTOMY  1989   L5-S1   RIGHT/LEFT HEART CATH AND CORONARY ANGIOGRAPHY N/A 08/15/2020   Procedure: RIGHT/LEFT HEART CATH AND CORONARY ANGIOGRAPHY;  Surgeon: Burnell Blanks, MD;  Location: [facility];  Service: Cardiovascular;  Laterality: N/A;   TRANSCATHETER AORTIC VALVE REPLACEMENT, TRANSFEMORAL Bilateral 10/29/2020   Procedure: TRANSCATHETER AORTIC VALVE REPLACEMENT, TRANSFEMORAL;  Surgeon: Burnell Blanks, MD;  Location: [facility];  Service: Open Heart Surgery;  Laterality: Bilateral;   ULTRASOUND GUIDANCE FOR VASCULAR ACCESS Bilateral 10/29/2020   Procedure: ULTRASOUND GUIDANCE FOR VASCULAR ACCESS;  Surgeon: Burnell Blanks, MD;  Location: [facility];  Service: Open Heart Surgery;  Laterality: Bilateral;    Family History  Problem Relation Age of Onset   Alzheimer's disease Father    Hypertension Father     Stroke Father    Hyperlipidemia Father    Prostate cancer Brother    Diabetes Brother    Alcohol abuse Brother    CAD Brother    Coronary artery disease Maternal Uncle    Schizophrenia Paternal Uncle    Emphysema Mother    Alcohol abuse Mother    Stroke Maternal Grandmother    Stroke Maternal Grandfather    Diabetes Maternal Grandfather    Stroke Paternal Grandmother    Stroke Paternal Grandfather     Social History:  reports that he has never smoked. He has never used smokeless tobacco. He reports that he does not currently use alcohol. He reports that he does not use drugs.   Review of Systems  Lipid history: On Crestor 5 mg from his PCP and his LDL is consistently around 70     Lab Results  Component Value Date   CHOL 186 12/25/2020   HDL 91.40 12/25/2020   LDLCALC 82 12/25/2020   LDLDIRECT 99.0 05/20/2015   TRIG 59.0 12/25/2020   CHOLHDL 2 12/25/2020           Hypertension: On Losartan 100 mg, 1/2 tablet daily with good control  He is monitoring at home  BP Readings from Last 3 Encounters:  08/01/21 112/74  07/16/21 114/70  07/01/21 134/72     Most recent foot exam: 11/2020    LABS:  Lab on 07/29/2021  Component Date Value Ref Range Status   Microalb, Ur 07/29/2021 <0.7  0.0 - 1.9 mg/dL Final   Creatinine,U 07/29/2021 45.5  mg/dL Final   Microalb Creat Ratio 07/29/2021 1.5  0.0 - 30.0 mg/g Final  Hgb A1c MFr Bld 07/29/2021 6.8 (H)  4.6 - 6.5 % Final   Glycemic Control Guidelines for People with Diabetes:Non Diabetic:  <6%Goal of Therapy: <7%Additional Action Suggested:  >8%    Sodium 07/30/2021 140  135 - 145 mEq/L Final   Potassium 07/30/2021 4.5  3.5 - 5.1 mEq/L Final   Chloride 07/30/2021 100  96 - 112 mEq/L Final   CO2 07/30/2021 32  19 - 32 mEq/L Final   Glucose, Bld 07/30/2021 125 (H)  70 - 99 mg/dL Final   BUN 07/30/2021 18  6 - 23 mg/dL Final   Creatinine, Ser 07/30/2021 1.05  0.40 - 1.50 mg/dL Final   Total Bilirubin 07/30/2021 0.5  0.2 -  1.2 mg/dL Final   Alkaline Phosphatase 07/30/2021 47  39 - 117 U/L Final   AST 07/30/2021 14  0 - 37 U/L Final   ALT 07/30/2021 16  0 - 53 U/L Final   Total Protein 07/30/2021 7.3  6.0 - 8.3 g/dL Final   Albumin 07/30/2021 4.6  3.5 - 5.2 g/dL Final   GFR 07/30/2021 73.14  >60.00 mL/min Final   Calculated using the CKD-EPI Creatinine Equation (2021)   Calcium 07/30/2021 9.8  8.4 - 10.5 mg/dL Final    Physical Examination:  BP 112/74    Pulse 74    Ht _0  (1.803 m)    Wt 228 lb 12.8 oz (103.8 kg)    SpO2 97%    BMI 31.91 kg/m    ASSESSMENT:  Diabetes type 2, non-insulin-dependent  See history of present illness for detailed discussion of current diabetes management, blood sugar patterns and problems identified     A1c is 6.8 and relatively higher  Currently on 3 drug regimen including Farxiga, metformin 1 g daily and Ozempic 1.0 mg weekly  His A1c is higher likely to be from being of Ozempic for couple of months until recently However even without this he has been able to maintain his weight since last year over the holidays and winter He has done well with exercise regimen Most of his higher readings are fasting and postprandial readings of usually well controlled with his lower carbohydrate intake   HYPERTENSION: well controlled Likely also benefiting from continuing Farxiga  No microalbuminuria   PLAN:    Discussed needing to have 2-3 small meals a day and not go all day without eating Continue regular exercise To check blood sugars on waking up more often He will start adding at least 500 mg of metformin at bedtime and if morning sugars are still not around 120 go up to 1000 mg Also consider going up to 2 mg Ozempic on the next visit  He can continue to use half tablet of losartan for hypertension along with his Farxiga  Follow-up in 3 months     Patient Instructions  Take extra 1/2 Metformin at bedtime and keep am sugar 120   Evan [name] 08/01/2021, 3:25  PM   Note: This office note was prepared with Dragon voice recognition system technology. Any transcriptional errors that result from this process are unintentional.

## 2021-08-04 DIAGNOSIS — M19011 Primary osteoarthritis, right shoulder: Secondary | ICD-10-CM | POA: Diagnosis not present

## 2021-08-04 DIAGNOSIS — M24811 Other specific joint derangements of right shoulder, not elsewhere classified: Secondary | ICD-10-CM | POA: Diagnosis not present

## 2021-08-05 DIAGNOSIS — M25511 Pain in right shoulder: Secondary | ICD-10-CM | POA: Diagnosis not present

## 2021-08-08 DIAGNOSIS — M19011 Primary osteoarthritis, right shoulder: Secondary | ICD-10-CM | POA: Diagnosis not present

## 2021-08-12 ENCOUNTER — Other Ambulatory Visit: Payer: Self-pay | Admitting: Physician Assistant

## 2021-08-12 DIAGNOSIS — Z952 Presence of prosthetic heart valve: Secondary | ICD-10-CM

## 2021-08-16 ENCOUNTER — Other Ambulatory Visit: Payer: Self-pay | Admitting: Family Medicine

## 2021-08-18 MED ORDER — LOSARTAN POTASSIUM 100 MG PO TABS: 50.0000 mg | ORAL_TABLET | Freq: Every day | ORAL | 1 refills | Status: DC

## 2021-08-30 ENCOUNTER — Other Ambulatory Visit: Payer: Self-pay | Admitting: Endocrinology

## 2021-08-30 DIAGNOSIS — U071 COVID-19: Secondary | ICD-10-CM | POA: Diagnosis not present

## 2021-08-30 DIAGNOSIS — E1165 Type 2 diabetes mellitus with hyperglycemia: Secondary | ICD-10-CM

## 2021-09-04 ENCOUNTER — Telehealth: Payer: Self-pay | Admitting: Cardiovascular Disease

## 2021-09-04 NOTE — Telephone Encounter (Signed)
The patient will be out of town 10/17/21. ?Rescheduled his 1 year TAVR echo and office visit with Nell Range to 10/22/2021. ?He was grateful for call and agrees with plan.  ?

## 2021-09-04 NOTE — Telephone Encounter (Signed)
New Message: ? ? ? ?Patient needs to reschedule his  10-17-21 appointment please. ?

## 2021-10-17 ENCOUNTER — Other Ambulatory Visit (HOSPITAL_COMMUNITY): Payer: Medicare Other

## 2021-10-17 ENCOUNTER — Ambulatory Visit: Payer: Medicare Other

## 2021-10-20 NOTE — Progress Notes (Signed)
[facility]   MULTIDISCIPLINARY HEART VALVE CLINIC                                     Cardiology Office Note:    Date:  10/22/2021   ID:  Evan [name], DOB October 06, 1955, MRN [id number]  PCP:  Eustaquio Boyden, MD  [facility] Cardiologist:  Parke Poisson, MD / Dr. Clifton James & Dr. Laneta Simmers (TAVR) [facility] Electrophysiologist:  None   Referring MD: Eustaquio Boyden, MD   1 year s/p TAVR  History of Present Illness:    MIHAEL [name] is a 66 y.o. male with a hx of depression, GERD, HTN, HLD, fatty liver disease, DMT2 and bicuspid aortic valve with severe aortic stenosis s/p TAVR (10/29/20) who presents to clinic for follow up.   He has been followed for moderate aortic stenosis with a bicuspid aortic valve by Dr. Jacques Navy. Echo 07/11/20 showed LVEF=55-60%, severe LVH, bicuspid AoV with severe calcification of the aortic valve leaflets with a mean gradient of 41 mmHg, peak gradient 59 mmHg. AVA 1.24 cm2, dimensionless index 0.35. He was referred to Dr. Clifton James with the structural heart team. At this visit, he described resting chest pain but no exertional dyspnea or chest pain. Cardiac cath 08/15/20 showed no evidence of CAD. Gated cardiac CT 08/22/20 with bicuspid aortic valve with thickened and calcified leaflets with partial fusion of the right and left coronary cusps with severely reduced cusp separation c/w severe AS. Valve calcium score of 2886. AV area 559 mm2 suitable for a 29 mm Edwards Sapien 3 valve (Edwards rep has commented on the use of a 26 mm Sapien 3 valve).  CTA of the chest/abdomen and pelvis with anatomy suitable for transfemoral approach to TAVR. Incidental finding of a hypervascular lesion in the right lobe of the liver as well as left adrenal nodule. Follow up MRI favored focal nodular hyperplasia and confirmed the presence of an adrenal nodule. He was seen back in the office in April and reported chest pressure and palpitations. The patient was not  interested in pursuing open heart surgery. He was felt to be a reasonable candidate for TAVR which was scheduled for 10/29/20. He was seen in the ER for chest pain and shortness of breath. He ruled out for MI and overall examination was normal. He was discharged home with plans to return for planned TAVR 10/29/20.  He was evaluated by the multidisciplinary valve team and underwent successful TAVR with a 29 mm Edwards Sapien 3 THV via the TF approach on 10/29/20. Post operative echo showed EF 55%, normally functioning TAVR with a mean gradient of 6.0 mmHg and trivial PVL. He was discharged home on aspirin and started on Plavix 75 mg daily. He has done well in follow up with improvement in chest pain.  He was seen in the office on 05/28/21 for evaluation of syncope using the bathroom. Zio XT showed SVT into the 200s and he was started on Toprol XL 12.5mg  daily. Also started on Lasix 2x a week for LE edema.   Today he presents to clinic for follow up. He never took the Toprol xl bc cutting back on alcohol and caffeine seemed to help. Never took lasix and not having issues with swelling. Walks 6 miles a day 5 days a week with no issues. No CP or SOB. No LE edema, orthopnea or PND. Occasional dizziness when he bends  over but no syncope. No blood in stool or urine. Very occasional palpitations.      Past Medical History:  Diagnosis Date   Adrenal adenoma    Complication of anesthesia    difficulty to weak up   COVID-19 virus infection 05/2020   Depression    intolerant of cymbalta and wellbutrin   Diverticulosis of colon    Esophagitis 03/2015   by EGD   Gastritis 03/2015   by EGD - mild, chronic   Hyperlipidemia    Hypertension    Left ventricular hypertrophy    Lichen simplex chronicus 05/2015   R scrotum Terri Piedra)   Liver lesion    2022 CTs: There was an incidental finding of a hypervascular lesion in the right lobe of the liver as well as left adrenal nodule. He underwent MRI of the abdomen  showing the appearance of the liver favored focal nodular hyperplasia, as well as small left adrenal adenoma.   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 Clifton James & Dr. Laneta Simmers    Severe aortic stenosis    T2DM (type 2 diabetes mellitus) (HCC)    DSME 09/2013    Past Surgical History:  Procedure Laterality Date   COLONOSCOPY  2006   diverticulosis per pt   COLONOSCOPY  03/2015   2 polyps, rpt 5 yrs Madilyn Fireman)   ESOPHAGOGASTRODUODENOSCOPY  03/2015   mild chronic gastritis, esophagitis    INTRAOPERATIVE TRANSTHORACIC ECHOCARDIOGRAM Left 10/29/2020   Procedure: INTRAOPERATIVE TRANSTHORACIC ECHOCARDIOGRAM;  Surgeon: Kathleene Hazel, MD;  Location: [facility] OR;  Service: Open Heart Surgery;  Laterality: Left;   LUMBAR LAMINECTOMY  1989   L5-S1   RIGHT/LEFT HEART CATH AND CORONARY ANGIOGRAPHY N/A 08/15/2020   Procedure: RIGHT/LEFT HEART CATH AND CORONARY ANGIOGRAPHY;  Surgeon: Kathleene Hazel, MD;  Location: [facility] INVASIVE CV LAB;  Service: Cardiovascular;  Laterality: N/A;   TRANSCATHETER AORTIC VALVE REPLACEMENT, TRANSFEMORAL Bilateral 10/29/2020   Procedure: TRANSCATHETER AORTIC VALVE REPLACEMENT, TRANSFEMORAL;  Surgeon: Kathleene Hazel, MD;  Location: [facility] OR;  Service: Open Heart Surgery;  Laterality: Bilateral;   ULTRASOUND GUIDANCE FOR VASCULAR ACCESS Bilateral 10/29/2020   Procedure: ULTRASOUND GUIDANCE FOR VASCULAR ACCESS;  Surgeon: Kathleene Hazel, MD;  Location: [facility] OR;  Service: Open Heart Surgery;  Laterality: Bilateral;    Current Medications: Current Meds  Medication Sig   amLODipine (NORVASC) 5 MG tablet Take 1 tablet (5 mg total) by mouth daily.   aspirin EC 81 MG tablet Take 81 mg by mouth every evening. Swallow whole.   Blood Glucose Monitoring Suppl (ONETOUCH  VERIO FLEX SYSTEM) w/Device KIT 1 kit by Does not apply route daily. Use to check blood sugars 2 times daily   cephALEXin (KEFLEX) 500 MG capsule Take 4 capsules (2,000 mg) one hour prior to all dental visits.   Cyanocobalamin (VITAMIN B-12) 2500 MCG SUBL Take 2,500 mcg by mouth daily.   FARXIGA 10 MG TABS tablet TAKE 1 TABLET (10 MG TOTAL) BY MOUTH EVERY MORNING.   fluticasone (FLONASE) 50 MCG/ACT nasal spray Place 1 spray into both nostrils daily as needed for allergies or rhinitis.   glucose blood (ONETOUCH VERIO) test strip USE TO CHECK BLOOD SUGARS 2 TIMES DAILY   losartan (COZAAR) 100 MG tablet Take 0.5 tablets (50 mg total) by mouth  daily.   Magnesium 250 MG TABS Take 250 mg by mouth every evening.   metFORMIN (GLUCOPHAGE) 1000 MG tablet TAKE 1 TABLET (1,000 MG TOTAL) BY MOUTH 2 TIMES DAILY WITH A MEAL. (Patient taking differently: Take 1,000 mg by mouth every evening.)   OZEMPIC, 1 MG/DOSE, 4 MG/3ML SOPN INJECT 1MG  INTO THE SKIN ONCE A WEEK   rosuvastatin (CRESTOR) 5 MG tablet TAKE 1 TABLET BY MOUTH EVERY DAY   sildenafil (REVATIO) 20 MG tablet Take 5 tablets (100 mg total) by mouth daily as needed (relations).   TURMERIC PO Take 1,800 mg by mouth every evening.   vitamin C (ASCORBIC ACID) 500 MG tablet Take 500 mg by mouth daily.   Vitamin D-Vitamin K (K2 PLUS D3 PO) Take 1 tablet by mouth every evening.   Zinc 50 MG TABS Take 50 mg by mouth every evening.     Allergies:   Penicillins   Social History   Socioeconomic History   Marital status: Married    Spouse name: Not on file   Number of children: 2   Years of education: Not on file   Highest education level: Not on file  Occupational History   Occupation: Retired-Worked for the [employer] Department  Tobacco Use   Smoking status: Never   Smokeless tobacco: Never  Substance and Sexual Activity   Alcohol use: Not Currently    Alcohol/week: 0.0 standard drinks    Comment: occasional   Drug use: No   Sexual activity: Not  on file  Other Topics Concern   Not on file  Social History Narrative   Caffeine: 3 cups coffee/day   Lives with wife and daughter, son in college, 2 cats and 1 dog   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  Keeps record of caloric intake   Social Determinants of Health   Financial Resource Strain: Low Risk    Difficulty of Paying Living Expenses: Not hard at all  Food Insecurity: No Food Insecurity   Worried About Programme researcher, broadcasting/film/video in the Last Year: Never true   Ran Out of Food in the Last Year: Never true  Transportation Needs: No Transportation Needs   Lack of Transportation (Medical): No   Lack of Transportation (Non-Medical): No  Physical Activity: Sufficiently Active   Days of Exercise per Week: 7 days   Minutes of Exercise per Session: 60 min  Stress: No Stress Concern Present   Feeling of Stress : Not at all  Social Connections: Not on file     Family History: The patient's family history includes Alcohol abuse in his brother and mother; Alzheimer's disease in his father; CAD in his brother; Coronary artery disease in his maternal uncle; Diabetes in his brother and maternal grandfather; Emphysema in his mother; Hyperlipidemia in his father; Hypertension in his father; Prostate cancer in his brother; Schizophrenia in his paternal uncle; Stroke in his father, maternal grandfather, maternal grandmother, paternal grandfather, and paternal grandmother.  ROS:   Please see the history of present illness.    All other systems reviewed and are negative.  EKGs/Labs/Other Studies Reviewed:    The following studies were reviewed today:  TAVR OPERATIVE NOTE     Date of Procedure:                10/29/2020   Preoperative Diagnosis:      Severe Aortic Stenosis    Postoperative Diagnosis:  Same    Procedure:        Transcatheter Aortic Valve Replacement - Percutaneous  Right Transfemoral Approach             Edwards Sapien 3  THV (size 29 mm, model # 9600TFX, serial # [id number])              Co-Surgeons:                        Alleen Borne, MD and  Verne Carrow, MD     Anesthesiologist:                  Fleeta Emmer, MD   Echocardiographer:              Merlyn Lot, MD   Pre-operative Echo Findings: Severe aortic stenosis Normal left ventricular systolic function   Post-operative Echo Findings: No paravalvular leak Normal left ventricular systolic function   _____________     Echo 10/30/20:  IMPRESSIONS   1. The aortic valve has been repaired/replaced. Aortic valve  regurgitation is not visualized. There is a 29 mm Sapien prosthetic (TAVR)  valve present in the aortic position. Procedure Date: 10/29/2020. Aortic  valve area, by VTI measures 5.17 cm. Aortic  valve mean gradient measures 6.0 mmHg. Aortic valve Vmax measures 1.51  m/s. Aortic valve acceleration time measures 84 msec. Trivial paravalular  leak at the 12 o'Clock Position; DVI 78.   2. Left ventricular ejection fraction, by estimation, is 55 to 60%. The  left ventricle has normal function.   3. Right ventricular systolic function is normal. The right ventricular  size is normal.   4. The mitral valve is grossly normal. No evidence of mitral valve  regurgitation.   Comparison(s): A prior study was performed on 10/29/20. Stable placement of  TAVR valve.   _______________________   Echo 10/30/2020 IMPRESSIONS   1. The aortic valve has been repaired/replaced. Aortic valve  regurgitation is not visualized. There is a 29 mm Sapien prosthetic (TAVR)  valve present in the aortic position. Procedure Date: 10/29/2020. Aortic  valve area, by VTI measures 5.17 cm. Aortic  valve mean gradient measures 6.0 mmHg. Aortic valve Vmax measures 1.51  m/s. Aortic valve acceleration time measures 84 msec. Trivial paravalular  leak at the 12 o'Clock Position; DVI 78.   2. Left  ventricular ejection fraction, by estimation, is 55 to 60%. The  left ventricle has normal function.   3. Right ventricular systolic function is normal. The right ventricular  size is normal.   4. The mitral valve is grossly normal. No evidence of mitral valve  regurgitation.   Comparison(s): A prior study was performed on 10/29/20. Stable placement of  TAVR valve.   __________________________   Echo 11/25/20 IMPRESSIONS  1. Left ventricular ejection fraction by 3D volume is 66 %. The left  ventricle has normal function. The left ventricle has no regional wall  motion abnormalities. Left ventricular diastolic parameters were normal.   2. Right ventricular systolic function is normal. The right ventricular  size is normal. There is normal pulmonary artery systolic pressure.   3. The mitral valve is grossly normal. No evidence of mitral valve  regurgitation.   4. The aortic valve has been repaired/replaced. Aortic valve  regurgitation is not visualized.   ______________________________  Echo 10/22/21  IMPRESSIONS   1. Left ventricular ejection fraction, by estimation, is 55 to 60%. The  left ventricle has normal function. The left  ventricle has no regional  wall motion abnormalities. Left ventricular diastolic parameters are  indeterminate.   2. Right ventricular systolic function is normal. The right ventricular  size is normal. Tricuspid regurgitation signal is inadequate for assessing  PA pressure.   3. The mitral valve is normal in structure. No evidence of mitral valve  regurgitation. No evidence of mitral stenosis.   4. Well-seated bioprosthetic aortic valve. The aortic valve is normal in  structure. Aortic valve regurgitation is not visualized. No aortic  stenosis is present. Aortic valve mean gradient measures 5.4 mmHg. Aortic  valve Vmax measures 1.51 m/s.   5. The inferior vena cava is normal in size with greater than 50%  respiratory variability, suggesting right  atrial pressure of 3 mmHg.    EKG:  EKG is NOT ordered today.   Recent Labs: 05/28/2021: Hemoglobin 15.0; Magnesium 2.0; Platelets 241 07/30/2021: ALT 16; BUN 18; Creatinine, Ser 1.05; Potassium 4.5; Sodium 140  Recent Lipid Panel    Component Value Date/Time   CHOL 186 12/25/2020 1110   TRIG 59.0 12/25/2020 1110   HDL 91.40 12/25/2020 1110   CHOLHDL 2 12/25/2020 1110   VLDL 11.8 12/25/2020 1110   LDLCALC 82 12/25/2020 1110   LDLDIRECT 99.0 05/20/2015 0955     Risk Assessment/Calculations:       Physical Exam:    VS:  BP 110/62   Pulse 63   Ht 5\' 11"  (1.803 m)   Wt 230 lb (104.3 kg)   SpO2 98%   BMI 32.08 kg/m     Wt Readings from Last 3 Encounters:  10/22/21 230 lb (104.3 kg)  08/01/21 228 lb 12.8 oz (103.8 kg)  07/16/21 230 lb 8 oz (104.6 kg)     GEN:  Well nourished, well developed in no acute distress HEENT: Normal NECK: No JVD LYMPHATICS: No lymphadenopathy CARDIAC: RRR, soft flow murmur @ RUSB. No rubs, gallops RESPIRATORY:  Clear to auscultation without rales, wheezing or rhonchi  ABDOMEN: Soft, non-tender, non-distended MUSCULOSKELETAL:  No edema; No deformity  SKIN: Warm and dry.  NEUROLOGIC:  Alert and oriented x 3 PSYCHIATRIC:  Normal affect   ASSESSMENT:    1. S/P TAVR (transcatheter aortic valve replacement)   2. Essential hypertension   3. SVT (supraventricular tachycardia) (HCC)   4. Bilateral leg edema      PLAN:    In order of problems listed above:  Severe AS s/p TAVR: echo today shows EF 65%, normally functioning TAVR with a mean gradient of 5.4 mm hg and no PVL. He has NYHA class I symptoms. Walks 6 miiles 5x a week. He has Keflex for SBE prophylaxis. Continue on aspirin alone  HTN: BP well controlled today. No changes made  SVT: improved with cutting back alcohol and caffeine intake  LE edema: not currently having issues with this and never took the lasix.    Medication Adjustments/Labs and Tests Ordered: Current  medicines are reviewed at length with the patient today.  Concerns regarding medicines are outlined above.  No orders of the defined types were placed in this encounter.   No orders of the defined types were placed in this encounter.    Patient Instructions  Medication Instructions:  Your physician recommends that you continue on your current medications as directed. Please refer to the Current Medication list given to you today.  *If you need a refill on your cardiac medications before your next appointment, please call your pharmacy*   Lab Work: None ordered   If  you have labs (blood work) drawn today and your tests are completely normal, you will receive your results only by: MyChart Message (if you have MyChart) OR A paper copy in the mail If you have any lab test that is abnormal or we need to change your treatment, we will call you to review the results.   Testing/Procedures: None ordered    Follow-Up: Follow up as scheduled :1}    Other Instructions   Important Information About Sugar         Signed, Cline Crock, PA-C  10/22/2021 2:40 PM    [facility]

## 2021-10-22 ENCOUNTER — Ambulatory Visit (HOSPITAL_COMMUNITY): Payer: Medicare Other | Attending: Cardiology

## 2021-10-22 ENCOUNTER — Ambulatory Visit (INDEPENDENT_AMBULATORY_CARE_PROVIDER_SITE_OTHER): Payer: Medicare Other | Admitting: Physician Assistant

## 2021-10-22 VITALS — BP 110/62 | HR 63 | Ht 71.0 in | Wt 230.0 lb

## 2021-10-22 DIAGNOSIS — I471 Supraventricular tachycardia: Secondary | ICD-10-CM | POA: Diagnosis not present

## 2021-10-22 DIAGNOSIS — I10 Essential (primary) hypertension: Secondary | ICD-10-CM | POA: Diagnosis not present

## 2021-10-22 DIAGNOSIS — Z952 Presence of prosthetic heart valve: Secondary | ICD-10-CM

## 2021-10-22 DIAGNOSIS — R600 Localized edema: Secondary | ICD-10-CM

## 2021-10-22 LAB — ECHOCARDIOGRAM COMPLETE
AV Mean grad: 5.4 mmHg
AV Peak grad: 9.1 mmHg
Ao pk vel: 1.51 m/s
Area-P 1/2: 2.83 cm2
S' Lateral: 3.5 cm

## 2021-10-22 MED ORDER — PERFLUTREN LIPID MICROSPHERE
1.0000 mL | INTRAVENOUS | Status: AC | PRN
  Administered 2021-10-22: 2 mL via INTRAVENOUS

## 2021-10-22 NOTE — Patient Instructions (Signed)
Medication Instructions:  ?Your physician recommends that you continue on your current medications as directed. Please refer to the Current Medication list given to you today. ? ?*If you need a refill on your cardiac medications before your next appointment, please call your pharmacy* ? ? ?Lab Work: ?None ordered  ? ?If you have labs (blood work) drawn today and your tests are completely normal, you will receive your results only by: ?MyChart Message (if you have MyChart) OR ?A paper copy in the mail ?If you have any lab test that is abnormal or we need to change your treatment, we will call you to review the results. ? ? ?Testing/Procedures: ?None ordered  ? ? ?Follow-Up: ?Follow up as scheduled  :1}  ? ? ?Other Instructions ? ?Important Information About Sugar ? ? ? ? ?  ?

## 2021-10-29 ENCOUNTER — Other Ambulatory Visit (INDEPENDENT_AMBULATORY_CARE_PROVIDER_SITE_OTHER): Payer: Medicare Other

## 2021-10-29 DIAGNOSIS — E1165 Type 2 diabetes mellitus with hyperglycemia: Secondary | ICD-10-CM | POA: Diagnosis not present

## 2021-10-29 LAB — BASIC METABOLIC PANEL
BUN: 15 mg/dL (ref 6–23)
CO2: 29 mEq/L (ref 19–32)
Calcium: 10 mg/dL (ref 8.4–10.5)
Chloride: 99 mEq/L (ref 96–112)
Creatinine, Ser: 1.08 mg/dL (ref 0.40–1.50)
GFR: 70.58 mL/min (ref >60.00)
Glucose, Bld: 121 mg/dL — ABNORMAL HIGH (ref 70–99)
Potassium: 4.2 mEq/L (ref 3.5–5.1)
Sodium: 135 mEq/L (ref 135–145)

## 2021-10-29 LAB — HEMOGLOBIN A1C: Hgb A1c MFr Bld: 6.1 % (ref 4.6–6.5)

## 2021-10-31 ENCOUNTER — Ambulatory Visit (INDEPENDENT_AMBULATORY_CARE_PROVIDER_SITE_OTHER): Payer: Medicare Other | Admitting: Endocrinology

## 2021-10-31 ENCOUNTER — Encounter: Payer: Self-pay | Admitting: Endocrinology

## 2021-10-31 VITALS — BP 120/70 | HR 64 | Ht 71.0 in | Wt 235.0 lb

## 2021-10-31 DIAGNOSIS — E669 Obesity, unspecified: Secondary | ICD-10-CM

## 2021-10-31 DIAGNOSIS — I10 Essential (primary) hypertension: Secondary | ICD-10-CM

## 2021-10-31 DIAGNOSIS — E1169 Type 2 diabetes mellitus with other specified complication: Secondary | ICD-10-CM

## 2021-10-31 NOTE — Patient Instructions (Signed)
Ozempic 2 mg weely

## 2021-10-31 NOTE — Progress Notes (Unsigned)
Patient ID: Evan [name], male   DOB: [birth year], 66 y.o.   MRN: [id number]          Reason for Appointment:Type 2 Diabetes and related problems   History of Present Illness:          Date of diagnosis of type 2 diabetes mellitus: 2012        Background history:   Diagnosed incidentally with a high blood sugar Has been on metformin since diagnosis and subsequently Amaryl had been added He has had sporadic follow-up for the last few years His A1c had been below 7 to about 08/2015 Subsequently in 9/17 his A1c had gone up to 8%  Recent history:      Non-insulin hypoglycemic drugs : Farxiga 10 mg,  metformin 1.5 g at dinner, Ozempic 1 mg weekly   His A1c is slightly higher at 6.8 compared to 6%   Current management, blood sugar patterns and problems identified:   On his last visit he was advised to continue 1 mg of Ozempic since with reducing this his weight had gone up 11 pounds  Since then his weight has leveled off  However he was not able to get Ozempic because of supply issues for about 2 months until 6 weeks ago  With this he appears to be having relatively higher fasting readings although checked less frequently now  He says he is still trying to lose weight and now only eating 1 meal a day in the evening except on weekends  Currently taking metformin only in the evening He is also trying to walk up to 6 miles about 5 days a week  Blood sugars were not reviewed from meter download on the last visit Takes Farxiga regularly without side effects  Meals: Usually skipping breakfast, dinner is usually a protein with vegetables or salad, low-fat intake       Side effects from medications have been: None  Glucose monitoring:  done 1 or less times a day         Glucometer: One Touch Verio  .      Blood Glucose readings by    PRE-MEAL Fasting Lunch Dinner Bedtime Overall  Glucose range: 120  120 120   Mean/median:        POST-MEAL PC Breakfast PC Lunch PC Dinner  Glucose  range:     Mean/median:         PRE-MEAL Fasting Lunch Dinner Bedtime Overall  Glucose range: 122-163 99-137   99-163  Mean/median: 140    129   POST-MEAL PC Breakfast PC Lunch PC Dinner  Glucose range:   117-162  Mean/median:      Previously:   PRE-MEAL Fasting Lunch Dinner Bedtime Overall  Glucose range:  122  95-123  126    Mean/median:   115   120   POST-MEAL PC Breakfast PC Lunch PC Dinner  Glucose range:    118, 141  Mean/median:       Self-care: The diet that the patient has been following is: tries to reduce high-fat and fried foods, Carbohydrates and avoiding drinks with sugar .       Dietician visit, most recent: 02/2017  Weight history: Highest previous weight 287 in 2019  Wt Readings from Last 3 Encounters:  10/31/21 235 lb (106.6 kg)  10/22/21 230 lb (104.3 kg)  08/01/21 228 lb 12.8 oz (103.8 kg)    Glycemic control:   Lab Results  Component Value Date   HGBA1C 6.1 10/29/2021  HGBA1C 6.8 (H) 07/29/2021   HGBA1C 6.0 03/25/2021   Lab Results  Component Value Date   MICROALBUR <0.7 07/29/2021   LDLCALC 82 12/25/2020   CREATININE 1.08 10/29/2021   Lab Results  Component Value Date   MICRALBCREAT 1.5 07/29/2021    Lab Results  Component Value Date   FRUCTOSAMINE 272 12/25/2020   FRUCTOSAMINE 224 12/19/2019   FRUCTOSAMINE 239 12/13/2017      Allergies as of 10/31/2021       Reactions   Penicillins    Respiratory distress as infant        Medication List        Accurate as of Oct 31, 2021 10:45 AM. If you have any questions, ask your nurse or doctor.          amLODipine 5 MG tablet Commonly known as: NORVASC Take 1 tablet (5 mg total) by mouth daily.   aspirin EC 81 MG tablet Take 81 mg by mouth every evening. Swallow whole.   cephALEXin 500 MG capsule Commonly known as: Keflex Take 4 capsules (2,000 mg) one hour prior to all dental visits.   Farxiga 10 MG Tabs tablet Generic drug: dapagliflozin propanediol TAKE 1  TABLET (10 MG TOTAL) BY MOUTH EVERY MORNING.   fluticasone 50 MCG/ACT nasal spray Commonly known as: FLONASE Place 1 spray into both nostrils daily as needed for allergies or rhinitis.   K2 PLUS D3 PO Take 1 tablet by mouth every evening.   losartan 100 MG tablet Commonly known as: COZAAR Take 0.5 tablets (50 mg total) by mouth daily.   Magnesium 250 MG Tabs Take 250 mg by mouth every evening.   metFORMIN 1000 MG tablet Commonly known as: GLUCOPHAGE TAKE 1 TABLET (1,000 MG TOTAL) BY MOUTH 2 TIMES DAILY WITH A MEAL. What changed: See the new instructions.   OneTouch Verio Flex System w/Device Kit 1 kit by Does not apply route daily. Use to check blood sugars 2 times daily   OneTouch Verio test strip Generic drug: glucose blood USE TO CHECK BLOOD SUGARS 2 TIMES DAILY   Ozempic (1 MG/DOSE) 4 MG/3ML Sopn Generic drug: Semaglutide (1 MG/DOSE) INJECT 1MG INTO THE SKIN ONCE A WEEK   rosuvastatin 5 MG tablet Commonly known as: CRESTOR TAKE 1 TABLET BY MOUTH EVERY DAY   sildenafil 20 MG tablet Commonly known as: REVATIO Take 5 tablets (100 mg total) by mouth daily as needed (relations).   TURMERIC PO Take 1,800 mg by mouth every evening.   Vitamin B-12 2500 MCG Subl Take 2,500 mcg by mouth daily.   vitamin C 500 MG tablet Commonly known as: ASCORBIC ACID Take 500 mg by mouth daily.   Zinc 50 MG Tabs Take 50 mg by mouth every evening.        Allergies:  Allergies  Allergen Reactions   Penicillins     Respiratory distress as infant    Past Medical History:  Diagnosis Date   Adrenal adenoma    Complication of anesthesia    difficulty to weak up   COVID-19 virus infection 05/2020   Depression    intolerant of cymbalta and wellbutrin   Diverticulosis of colon    Esophagitis 03/2015   by EGD   Gastritis 03/2015   by EGD - mild, chronic   Hyperlipidemia    Hypertension    Left ventricular hypertrophy    Lichen simplex chronicus 05/2015   R scrotum  [name])   Liver lesion    2022 CTs: There was  an incidental finding of a hypervascular lesion in the right lobe of the liver as well as left adrenal nodule. He underwent MRI of the abdomen showing the appearance of the liver favored focal nodular hyperplasia, as well as small left adrenal adenoma.   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 Angelena Form & Dr. Cyndia Bent    Severe aortic stenosis    T2DM (type 2 diabetes mellitus) ([place])    DSME 09/2013    Past Surgical History:  Procedure Laterality Date   COLONOSCOPY  2006   diverticulosis per pt   COLONOSCOPY  03/2015   2 polyps, rpt 5 yrs [name])   ESOPHAGOGASTRODUODENOSCOPY  03/2015   mild chronic gastritis, esophagitis    INTRAOPERATIVE TRANSTHORACIC ECHOCARDIOGRAM Left 10/29/2020   Procedure: INTRAOPERATIVE TRANSTHORACIC ECHOCARDIOGRAM;  Surgeon: Burnell Blanks, MD;  Location: [facility];  Service: Open Heart Surgery;  Laterality: Left;   LUMBAR LAMINECTOMY  1989   L5-S1   RIGHT/LEFT HEART CATH AND CORONARY ANGIOGRAPHY N/A 08/15/2020   Procedure: RIGHT/LEFT HEART CATH AND CORONARY ANGIOGRAPHY;  Surgeon: Burnell Blanks, MD;  Location: [facility];  Service: Cardiovascular;  Laterality: N/A;   TRANSCATHETER AORTIC VALVE REPLACEMENT, TRANSFEMORAL Bilateral 10/29/2020   Procedure: TRANSCATHETER AORTIC VALVE REPLACEMENT, TRANSFEMORAL;  Surgeon: Burnell Blanks, MD;  Location: [facility];  Service: Open Heart Surgery;  Laterality: Bilateral;   ULTRASOUND GUIDANCE FOR VASCULAR ACCESS Bilateral 10/29/2020   Procedure: ULTRASOUND GUIDANCE FOR VASCULAR ACCESS;  Surgeon: Burnell Blanks, MD;  Location: [facility];  Service: Open Heart Surgery;  Laterality: Bilateral;    Family History  Problem Relation Age of Onset   Alzheimer's  disease Father    Hypertension Father    Stroke Father    Hyperlipidemia Father    Prostate cancer Brother    Diabetes Brother    Alcohol abuse Brother    CAD Brother    Coronary artery disease Maternal Uncle    Schizophrenia Paternal Uncle    Emphysema Mother    Alcohol abuse Mother    Stroke Maternal Grandmother    Stroke Maternal Grandfather    Diabetes Maternal Grandfather    Stroke Paternal Grandmother    Stroke Paternal Grandfather     Social History:  reports that he has never smoked. He has never used smokeless tobacco. He reports that he does not currently use alcohol. He reports that he does not use drugs.   Review of Systems  Lipid history: On Crestor 5 mg from his PCP and his LDL is consistently around 70     Lab Results  Component Value Date   CHOL 186 12/25/2020   HDL 91.40 12/25/2020   LDLCALC 82 12/25/2020   LDLDIRECT 99.0 05/20/2015   TRIG 59.0 12/25/2020   CHOLHDL 2 12/25/2020           Hypertension: On Losartan 100 mg, 1/2 tablet daily with good control  He is monitoring at home  BP Readings from Last 3 Encounters:  10/31/21 120/70  10/22/21 110/62  08/01/21 112/74     Most recent foot exam: 11/2020    LABS:  Lab on 10/29/2021  Component Date Value Ref Range Status   Sodium 10/29/2021 135  135 - 145 mEq/L Final   Potassium 10/29/2021 4.2  3.5 -  5.1 mEq/L Final   Chloride 10/29/2021 99  96 - 112 mEq/L Final   CO2 10/29/2021 29  19 - 32 mEq/L Final   Glucose, Bld 10/29/2021 121 (H)  70 - 99 mg/dL Final   BUN 10/29/2021 15  6 - 23 mg/dL Final   Creatinine, Ser 10/29/2021 1.08  0.40 - 1.50 mg/dL Final   GFR 10/29/2021 70.58  >60.00 mL/min Final   Calculated using the CKD-EPI Creatinine Equation (2021)   Calcium 10/29/2021 10.0  8.4 - 10.5 mg/dL Final   Hgb A1c MFr Bld 10/29/2021 6.1  4.6 - 6.5 % Final   Glycemic Control Guidelines for People with Diabetes:Non Diabetic:  <6%Goal of Therapy: <7%Additional Action Suggested:  >8%      Physical Examination:  BP 120/70   Pulse 64   Ht '5\' 11"'  (1.803 m)   Wt 235 lb (106.6 kg)   SpO2 98%   BMI 32.78 kg/m    ASSESSMENT:  Diabetes type 2, non-insulin-dependent  See history of present illness for detailed discussion of current diabetes management, blood sugar patterns and problems identified     A1c is 6.8 and relatively higher  Currently on 3 drug regimen including Farxiga, metformin 1 g daily and Ozempic 1.0 mg weekly  His A1c is higher likely to be from being of Ozempic for couple of months until recently However even without this he has been able to maintain his weight since last year over the holidays and winter He has done well with exercise regimen Most of his higher readings are fasting and postprandial readings of usually well controlled with his lower carbohydrate intake   HYPERTENSION: well controlled Likely also benefiting from continuing Farxiga  No microalbuminuria   PLAN:    Discussed needing to have 2-3 small meals a day and not go all day without eating Continue regular exercise  To check blood sugars on waking up more often He will start adding at least 500 mg of metformin at bedtime and if morning sugars are still not around 120 go up to 1000 mg Also consider going up to 2 mg Ozempic on the next visit  He can continue to use half tablet of losartan for hypertension along with his Farxiga  Follow-up in 3 months     There are no Patient Instructions on file for this visit.   Elayne Snare 10/31/2021, 10:45 AM   Note: This office note was prepared with Dragon voice recognition system technology. Any transcriptional errors that result from this process are unintentional.

## 2021-11-08 ENCOUNTER — Other Ambulatory Visit: Payer: Self-pay | Admitting: Endocrinology

## 2021-11-15 ENCOUNTER — Encounter: Payer: Self-pay | Admitting: Endocrinology

## 2021-11-15 DIAGNOSIS — E1169 Type 2 diabetes mellitus with other specified complication: Secondary | ICD-10-CM

## 2021-11-16 ENCOUNTER — Other Ambulatory Visit: Payer: Self-pay | Admitting: Physician Assistant

## 2021-11-18 DIAGNOSIS — R972 Elevated prostate specific antigen [PSA]: Secondary | ICD-10-CM | POA: Diagnosis not present

## 2021-11-18 MED ORDER — SEMAGLUTIDE (2 MG/DOSE) 8 MG/3ML ~~LOC~~ SOPN: 2.0000 mg | PEN_INJECTOR | SUBCUTANEOUS | 2 refills | Status: DC

## 2021-11-25 DIAGNOSIS — R972 Elevated prostate specific antigen [PSA]: Secondary | ICD-10-CM | POA: Diagnosis not present

## 2021-11-25 DIAGNOSIS — N5201 Erectile dysfunction due to arterial insufficiency: Secondary | ICD-10-CM | POA: Diagnosis not present

## 2021-12-01 ENCOUNTER — Ambulatory Visit: Payer: Medicare Other | Admitting: Internal Medicine

## 2021-12-13 ENCOUNTER — Other Ambulatory Visit: Payer: Self-pay | Admitting: Endocrinology

## 2021-12-15 ENCOUNTER — Encounter: Payer: Self-pay | Admitting: Family Medicine

## 2021-12-15 DIAGNOSIS — R42 Dizziness and giddiness: Secondary | ICD-10-CM

## 2021-12-15 DIAGNOSIS — H90A32 Mixed conductive and sensorineural hearing loss, unilateral, left ear with restricted hearing on the contralateral side: Secondary | ICD-10-CM

## 2021-12-20 ENCOUNTER — Other Ambulatory Visit: Payer: Self-pay | Admitting: Family Medicine

## 2021-12-20 DIAGNOSIS — E118 Type 2 diabetes mellitus with unspecified complications: Secondary | ICD-10-CM

## 2021-12-20 DIAGNOSIS — D3502 Benign neoplasm of left adrenal gland: Secondary | ICD-10-CM

## 2021-12-20 DIAGNOSIS — E1169 Type 2 diabetes mellitus with other specified complication: Secondary | ICD-10-CM

## 2021-12-23 ENCOUNTER — Other Ambulatory Visit (INDEPENDENT_AMBULATORY_CARE_PROVIDER_SITE_OTHER): Payer: Medicare Other

## 2021-12-23 DIAGNOSIS — D3502 Benign neoplasm of left adrenal gland: Secondary | ICD-10-CM

## 2021-12-23 DIAGNOSIS — E669 Obesity, unspecified: Secondary | ICD-10-CM | POA: Diagnosis not present

## 2021-12-23 DIAGNOSIS — E1169 Type 2 diabetes mellitus with other specified complication: Secondary | ICD-10-CM

## 2021-12-23 DIAGNOSIS — E785 Hyperlipidemia, unspecified: Secondary | ICD-10-CM

## 2021-12-23 LAB — BASIC METABOLIC PANEL
BUN: 15 mg/dL (ref 6–23)
CO2: 30 mEq/L (ref 19–32)
Calcium: 9.6 mg/dL (ref 8.4–10.5)
Chloride: 98 mEq/L (ref 96–112)
Creatinine, Ser: 1 mg/dL (ref 0.40–1.50)
GFR: 77.33 mL/min (ref >60.00)
Glucose, Bld: 132 mg/dL — ABNORMAL HIGH (ref 70–99)
Potassium: 4.8 mEq/L (ref 3.5–5.1)
Sodium: 136 mEq/L (ref 135–145)

## 2021-12-23 LAB — COMPREHENSIVE METABOLIC PANEL
ALT: 17 U/L (ref 0–53)
AST: 15 U/L (ref 0–37)
Albumin: 4.6 g/dL (ref 3.5–5.2)
Alkaline Phosphatase: 43 U/L (ref 39–117)
BUN: 15 mg/dL (ref 6–23)
CO2: 30 mEq/L (ref 19–32)
Calcium: 9.6 mg/dL (ref 8.4–10.5)
Chloride: 98 mEq/L (ref 96–112)
Creatinine, Ser: 1 mg/dL (ref 0.40–1.50)
GFR: 77.33 mL/min (ref >60.00)
Glucose, Bld: 132 mg/dL — ABNORMAL HIGH (ref 70–99)
Potassium: 4.8 mEq/L (ref 3.5–5.1)
Sodium: 136 mEq/L (ref 135–145)
Total Bilirubin: 0.8 mg/dL (ref 0.2–1.2)
Total Protein: 6.9 g/dL (ref 6.0–8.3)

## 2021-12-23 LAB — LIPID PANEL
Cholesterol: 189 mg/dL (ref 0–200)
HDL: 64.4 mg/dL (ref >39.00)
NonHDL: 124.19
Total CHOL/HDL Ratio: 3
Triglycerides: 263 mg/dL — ABNORMAL HIGH (ref 0.0–149.0)
VLDL: 52.6 mg/dL — ABNORMAL HIGH (ref 0.0–40.0)

## 2021-12-23 LAB — HEMOGLOBIN A1C: Hgb A1c MFr Bld: 6.5 % (ref 4.6–6.5)

## 2021-12-23 LAB — LDL CHOLESTEROL, DIRECT: Direct LDL: 89 mg/dL

## 2021-12-30 ENCOUNTER — Ambulatory Visit (INDEPENDENT_AMBULATORY_CARE_PROVIDER_SITE_OTHER): Payer: Medicare Other | Admitting: Family Medicine

## 2021-12-30 ENCOUNTER — Encounter: Payer: Self-pay | Admitting: Family Medicine

## 2021-12-30 ENCOUNTER — Encounter: Payer: Self-pay | Admitting: *Deleted

## 2021-12-30 VITALS — BP 126/70 | HR 72 | Temp 97.5°F | Ht 70.0 in | Wt 236.5 lb

## 2021-12-30 DIAGNOSIS — Z7189 Other specified counseling: Secondary | ICD-10-CM | POA: Diagnosis not present

## 2021-12-30 DIAGNOSIS — I10 Essential (primary) hypertension: Secondary | ICD-10-CM

## 2021-12-30 DIAGNOSIS — R972 Elevated prostate specific antigen [PSA]: Secondary | ICD-10-CM

## 2021-12-30 DIAGNOSIS — I6523 Occlusion and stenosis of bilateral carotid arteries: Secondary | ICD-10-CM | POA: Diagnosis not present

## 2021-12-30 DIAGNOSIS — E1169 Type 2 diabetes mellitus with other specified complication: Secondary | ICD-10-CM | POA: Diagnosis not present

## 2021-12-30 DIAGNOSIS — K760 Fatty (change of) liver, not elsewhere classified: Secondary | ICD-10-CM

## 2021-12-30 DIAGNOSIS — K769 Liver disease, unspecified: Secondary | ICD-10-CM | POA: Diagnosis not present

## 2021-12-30 DIAGNOSIS — H90A32 Mixed conductive and sensorineural hearing loss, unilateral, left ear with restricted hearing on the contralateral side: Secondary | ICD-10-CM

## 2021-12-30 DIAGNOSIS — F3342 Major depressive disorder, recurrent, in full remission: Secondary | ICD-10-CM | POA: Diagnosis not present

## 2021-12-30 DIAGNOSIS — Z1211 Encounter for screening for malignant neoplasm of colon: Secondary | ICD-10-CM

## 2021-12-30 DIAGNOSIS — M19011 Primary osteoarthritis, right shoulder: Secondary | ICD-10-CM

## 2021-12-30 DIAGNOSIS — Z952 Presence of prosthetic heart valve: Secondary | ICD-10-CM | POA: Diagnosis not present

## 2021-12-30 DIAGNOSIS — Z0000 Encounter for general adult medical examination without abnormal findings: Secondary | ICD-10-CM | POA: Diagnosis not present

## 2021-12-30 DIAGNOSIS — E785 Hyperlipidemia, unspecified: Secondary | ICD-10-CM

## 2021-12-30 NOTE — Assessment & Plan Note (Signed)
Stable period off antidepressant

## 2021-12-30 NOTE — Assessment & Plan Note (Signed)
Regularly sees endocrinology.  On ozempic, farxiga, metformin.

## 2021-12-30 NOTE — Progress Notes (Signed)
Patient ID: Evan [name], male    DOB: Nov 16, 1955, 66 y.o.   MRN: [id number]  This visit was conducted in person.  BP 126/70   Pulse 72   Temp (!) 97.5 F (36.4 C) (Temporal)   Ht '5\' 10"'  (1.778 m)   Wt 236 lb 8 oz (107.3 kg)   SpO2 97%   BMI 33.93 kg/m    CC: AMW Subjective:   HPI: Evan [name] is a 66 y.o. male presenting on 12/30/2021 for Medicare Wellness   Did not see health advisor.   Hearing Screening   '500Hz'  '1000Hz'  '2000Hz'  '4000Hz'   Right ear '20 25 20 ' 0  Left ear 20 40 20 0  Vision Screening - Comments:: Last eye exam, 05/2021.  [facility] Visit from 12/30/2021 in [facility] [place]  PHQ-2 Total Score 2          12/30/2021    8:59 AM 12/25/2020    1:31 PM 11/14/2020   10:10 AM 12/25/2019    8:34 AM 12/21/2018    3:06 PM  [place] in the past year? 1 0 0 0 0  Number falls in past yr: 0 0 0    Injury with Fall? 0 0 0    Risk for fall due to :  Medication side effect     Follow up  Falls evaluation completed;Falls prevention discussed Falls evaluation completed     Heart valve surgery for severe AS (TAVR) 10/2020.  Catheterization 08/2020 - no significant CAD.   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 Most recently referred to [facility] - having difficulty getting scheduled. Dizziness has improved with decreased caffeine and alcohol intake. Chronic tinnitus, L ear fullness, L hearing loss.    DM - sees endo Dr Dwyane Dee on ozempic metformin and farxiga.   R shoulder OA - [place] referred to Dr Tamera Punt ortho. Severe arthritis but no need for surgery at this time.  Occasional L knee pain - uses brace.   Preventative: COLONOSCOPY Date: 03/2015 2 polyps, rpt 5 yrs [name] @ [facility]). iFOB normal last year.  ESOPHAGOGASTRODUODENOSCOPY Date: 03/2015 mild chronic gastritis, esophagitis -  on daily PPI  Prostate cancer screening - Brother with prostate cancer age 70. Nocturia x1. Yearly PSA - sees Dr Tresa Moore yearly at [facility] urology with PSA.  Lung cancer screening - not eligible  Flu shot yearly COVID vaccine - [place] 06/2019, 07/2019, booster 03/2020 Tdap 04/2012 Pneumovax23 12/2012, 12/2019. Prevnar13 12/2018 Shingrix - discussed, declines. Has had shingles x4.  Advanced planning discussion - has this at home. HCPOA would be wife then oldest child. Asked to bring [place] copy. Seat belt use discussed.  Sunscreen use discussed. No changing moles on skin.  Sleep - averaging 4-5 hours/night, awakens early then can't fall back asleep. Benadryl PRN, melatonin ineffective.  Non smoker Alcohol - seldom  Dentist - Q6 months  Eye exam - yearly Bowels - no constipation or diarrhea Bladder - no incontinence    Caffeine: Lives with wife and daughter, son in college, 2 cats and 1 dog   Occupation: Advice worker for [employer]: masters degree Activity: walks 30+min/day Diet: good water, fruits/vegetables daily. Keeps record of caloric intake     Relevant past medical, surgical, family and social history reviewed and updated as indicated. Interim medical history since our last  visit reviewed. Allergies and medications reviewed and updated. Outpatient Medications Prior to Visit  Medication Sig Dispense Refill   amLODipine (NORVASC) 5 MG tablet TAKE 1 TABLET (5 MG TOTAL) BY MOUTH DAILY. 90 tablet 3   aspirin EC 81 MG tablet Take 81 mg by mouth every evening. Swallow whole.     Blood Glucose Monitoring Suppl (ONETOUCH VERIO FLEX SYSTEM) w/Device KIT 1 kit by Does not apply route daily. Use to check blood sugars 2 times daily 1 kit 2   cephALEXin (KEFLEX) 500 MG capsule Take 4 capsules (2,000 mg) one hour prior to all dental visits. 12 capsule 12   Cyanocobalamin (VITAMIN B-12) 2500 MCG SUBL Take 2,500 mcg by mouth daily.     FARXIGA 10 MG TABS tablet TAKE 1 TABLET (10 MG TOTAL)  BY MOUTH EVERY MORNING. 30 tablet 2   fluticasone (FLONASE) 50 MCG/ACT nasal spray Place 1 spray into both nostrils daily as needed for allergies or rhinitis.     glucose blood (ONETOUCH VERIO) test strip USE TO CHECK BLOOD SUGARS 2 TIMES DAILY 200 strip 3   losartan (COZAAR) 100 MG tablet Take 0.5 tablets (50 mg total) by mouth daily. 45 tablet 1   Magnesium 250 MG TABS Take 250 mg by mouth every evening.     metFORMIN (GLUCOPHAGE) 1000 MG tablet Take 1 tablet (1,000 mg total) by mouth every evening. 90 tablet 2   rosuvastatin (CRESTOR) 5 MG tablet TAKE 1 TABLET BY MOUTH EVERY DAY 90 tablet 3   Semaglutide, 2 MG/DOSE, 8 MG/3ML SOPN Inject 2 mg as directed once a week. 3 mL 2   sildenafil (REVATIO) 20 MG tablet Take 5 tablets (100 mg total) by mouth daily as needed (relations). 30 tablet 6   TURMERIC PO Take 1,800 mg by mouth every evening.     vitamin C (ASCORBIC ACID) 500 MG tablet Take 500 mg by mouth daily.     Vitamin D-Vitamin K (K2 PLUS D3 PO) Take 1 tablet by mouth every evening.     Zinc 50 MG TABS Take 50 mg by mouth every evening.     No facility-administered medications prior to visit.     Per HPI unless specifically indicated in ROS section below Review of Systems  Objective:  BP 126/70   Pulse 72   Temp (!) 97.5 F (36.4 C) (Temporal)   Ht '5\' 10"'  (1.778 m)   Wt 236 lb 8 oz (107.3 kg)   SpO2 97%   BMI 33.93 kg/m   Wt Readings from Last 3 Encounters:  12/30/21 236 lb 8 oz (107.3 kg)  10/31/21 235 lb (106.6 kg)  10/22/21 230 lb (104.3 kg)      Physical Exam Vitals and nursing note reviewed.  Constitutional:      General: He is not in acute distress.    Appearance: Normal appearance. He is well-developed. He is not ill-appearing.  HENT:     Head: Normocephalic and atraumatic.     Right Ear: Hearing, tympanic membrane, ear canal and external ear normal.     Left Ear: Hearing, tympanic membrane, ear canal and external ear normal.     Ears:     Comments: No ear  tube appreciated in left TM Eyes:     General: No scleral icterus.    Extraocular Movements: Extraocular movements intact.     Conjunctiva/sclera: Conjunctivae normal.     Pupils: Pupils are equal, round, and reactive to light.  Neck:     Thyroid: No thyroid  mass or thyromegaly.     Vascular: No carotid bruit.  Cardiovascular:     Rate and Rhythm: Normal rate and regular rhythm.     Pulses: Normal pulses.          Radial pulses are 2+ on the right side and 2+ on the left side.     Heart sounds: Murmur (3/6 systolic RUSB) heard.  Pulmonary:     Effort: Pulmonary effort is normal. No respiratory distress.     Breath sounds: Normal breath sounds. No wheezing, rhonchi or rales.  Abdominal:     General: Bowel sounds are normal. There is no distension.     Palpations: Abdomen is soft. There is no mass.     Tenderness: There is no abdominal tenderness. There is no guarding or rebound.     Hernia: No hernia is present.  Musculoskeletal:        General: Normal range of motion.     Cervical back: Normal range of motion and neck supple.     Right lower leg: No edema.     Left lower leg: No edema.  Lymphadenopathy:     Cervical: No cervical adenopathy.  Skin:    General: Skin is warm and dry.     Findings: No rash.  Neurological:     General: No focal deficit present.     Mental Status: He is alert and oriented to person, place, and time.     Comments:  Recall 3/3 Calculation 5/5 DLROW  Psychiatric:        Mood and Affect: Mood normal.        Behavior: Behavior normal.        Thought Content: Thought content normal.        Judgment: Judgment normal.       Results for orders placed or performed in visit on 12/23/21  Comprehensive metabolic panel  Result Value Ref Range   Sodium 136 135 - 145 mEq/L   Potassium 4.8 3.5 - 5.1 mEq/L   Chloride 98 96 - 112 mEq/L   CO2 30 19 - 32 mEq/L   Glucose, Bld 132 (H) 70 - 99 mg/dL   BUN 15 6 - 23 mg/dL   Creatinine, Ser 1.00 0.40 - 1.50  mg/dL   Total Bilirubin 0.8 0.2 - 1.2 mg/dL   Alkaline Phosphatase 43 39 - 117 U/L   AST 15 0 - 37 U/L   ALT 17 0 - 53 U/L   Total Protein 6.9 6.0 - 8.3 g/dL   Albumin 4.6 3.5 - 5.2 g/dL   GFR 77.33 >60.00 mL/min   Calcium 9.6 8.4 - 10.5 mg/dL  Lipid panel  Result Value Ref Range   Cholesterol 189 0 - 200 mg/dL   Triglycerides 263.0 (H) 0.0 - 149.0 mg/dL   HDL 64.40 >39.00 mg/dL   VLDL 52.6 (H) 0.0 - 40.0 mg/dL   Total CHOL/HDL Ratio 3    NonHDL 010.27   Basic metabolic panel  Result Value Ref Range   Sodium 136 135 - 145 mEq/L   Potassium 4.8 3.5 - 5.1 mEq/L   Chloride 98 96 - 112 mEq/L   CO2 30 19 - 32 mEq/L   Glucose, Bld 132 (H) 70 - 99 mg/dL   BUN 15 6 - 23 mg/dL   Creatinine, Ser 1.00 0.40 - 1.50 mg/dL   GFR 77.33 >60.00 mL/min   Calcium 9.6 8.4 - 10.5 mg/dL  Hemoglobin A1c  Result Value Ref Range   Hgb A1c MFr  Bld 6.5 4.6 - 6.5 %  LDL cholesterol, direct  Result Value Ref Range   Direct LDL 89.0 mg/dL      12/30/2021    9:42 AM 12/25/2020    1:32 PM 11/14/2020   10:10 AM 12/25/2019    8:34 AM 12/21/2018    3:06 PM  Depression screen PHQ 2/9  Decreased Interest 1 0 0 0 0  Down, Depressed, Hopeless 1 0 0 0 0  PHQ - 2 Score 2 0 0 0 0  Altered sleeping 3 0     Tired, decreased energy 2 0     Change in appetite 0 0     Feeling bad or failure about yourself  0 0     Trouble concentrating 0 0     Moving slowly or fidgety/restless 0 0     Suicidal thoughts 0 0     PHQ-9 Score 7 0     Difficult doing work/chores Somewhat difficult Not difficult at all         12/30/2021    9:42 AM  GAD 7 : Generalized Anxiety Score  Nervous, Anxious, on Edge 0  Control/stop worrying 0  Worry too much - different things 0  Trouble relaxing 1  Restless 0  Easily annoyed or irritable 0  Afraid - awful might happen 0  Total GAD 7 Score 1  Anxiety Difficulty Not difficult at all   Assessment & Plan:   Problem List Items Addressed This Visit     Advanced directives,  counseling/discussion (Chronic)    Advanced planning discussion - has this at home. HCPOA would be wife then oldest child. Asked to bring [place] copy.      Medicare annual wellness visit, subsequent - Primary (Chronic)    I have personally reviewed the Medicare Annual Wellness questionnaire and have noted 1. The patient's medical and social history 2. Their use of alcohol, tobacco or illicit drugs 3. Their current medications and supplements 4. The patient's functional ability including ADL's, fall risks, home safety risks and hearing or visual impairment. Cognitive function has been assessed and addressed as indicated.  5. Diet and physical activity 6. Evidence for depression or mood disorders The patients weight, height, BMI have been recorded in the chart. I have made referrals, counseling and provided education to the patient based on review of the above and I have provided the pt with a written personalized care plan for preventive services. Provider list updated.. See scanned questionairre as needed for further documentation. Reviewed preventative protocols and updated unless pt declined.       Type 2 diabetes mellitus with other specified complication [facility])    Regularly sees endocrinology.  On ozempic, farxiga, metformin.       Hyperlipidemia associated with type 2 diabetes mellitus (HCC)    Chronic, stable on crestor - continue. Trig elevation noted today.  The 10-year ASCVD risk score (Arnett DK, et al., 2019) is: 27.4%   Values used to calculate the score:     Age: 76 years     Sex: Male     Is Non-Hispanic African American: No     Diabetic: Yes     Tobacco smoker: No     Systolic Blood Pressure: 854 mmHg     Is BP treated: Yes     HDL Cholesterol: 64.4 mg/dL     Total Cholesterol: 189 mg/dL       MDD (major depressive disorder), recurrent, in full remission ([place])    Stable period  off antidepressant      Essential hypertension    Chronic, stable on current regimen -  continue losartan, amlodipine.       Osteoarthritis of right shoulder    Saw sports medicine - dx severe osteoarthritis, saw ortho Dr Tamera Punt, rec against surgery at this time.       Carotid stenosis    Bruit - referred from heart murmur      Hepatic steatosis   Rising PSA level    Reassuring uro eval. Seeing yearly.       S/P TAVR (transcatheter aortic valve replacement)   Liver lesion, right lobe    Reviewed MRI from 2022 - consider rpt liver MRI 09/2021 for presumed nodule hyperplasia however LFTs remain normal.       Left ear hearing loss    Difficulty getting appt with [facility] - my referral coordinator called today, pt will call and schedule appt today, will let me know if any difficulty getting this scheduled.      Other Visit Diagnoses     Special screening for malignant neoplasms, colon       Relevant Orders   Ambulatory referral to Gastroenterology        No orders of the defined types were placed in this encounter.  Orders Placed This Encounter  Procedures   Ambulatory referral to Gastroenterology    Referral Priority:   Routine    Referral Type:   Consultation    Referral Reason:   Specialty Services Required    Number of Visits Requested:   1    Patient instructions: We will refer you back to [facility] to discuss colonoscopy.  Bring [place] a copy of your living will to update your chart.  Call [facility] today to schedule appointment and let me know if any trouble getting in.  You are doing well today Return as needed or in 6 months for follow up visit.   Follow up plan: Return in about 6 months (around 07/02/2022) for follow up visit.  Ria Bush, MD

## 2021-12-30 NOTE — Assessment & Plan Note (Signed)
Reviewed MRI from 2022 - consider rpt liver MRI 09/2021 for presumed nodule hyperplasia however LFTs remain normal.

## 2021-12-30 NOTE — Assessment & Plan Note (Signed)
Chronic, stable on crestor - continue. Trig elevation noted today.  The 10-year ASCVD risk score (Arnett DK, et al., 2019) is: 27.4%   Values used to calculate the score:     Age: 66 years     Sex: Male     Is Non-Hispanic African American: No     Diabetic: Yes     Tobacco smoker: No     Systolic Blood Pressure: 986 mmHg     Is BP treated: Yes     HDL Cholesterol: 64.4 mg/dL     Total Cholesterol: 189 mg/dL

## 2021-12-30 NOTE — Patient Instructions (Addendum)
We will refer you back to [facility] to discuss colonoscopy.  Bring [place] a copy of your living will to update your chart.  Call [facility] today to schedule appointment and let me know if any trouble getting in.  You are doing well today Return as needed or in 6 months for follow up visit.   Health Maintenance After Age 68 After age 49, you are at a higher risk for certain long-term diseases and infections as well as injuries from falls. Falls are a major cause of broken bones and head injuries in people who are older than age 70. Getting regular preventive care can help to keep you healthy and well. Preventive care includes getting regular testing and making lifestyle changes as recommended by your health care provider. Talk with your health care provider about: Which screenings and tests you should have. A screening is a test that checks for a disease when you have no symptoms. A diet and exercise plan that is right for you. What should I know about screenings and tests to prevent falls? Screening and testing are the best ways to find a health problem early. Early diagnosis and treatment give you the best chance of managing medical conditions that are common after age 11. Certain conditions and lifestyle choices may make you more likely to have a fall. Your health care provider may recommend: Regular vision checks. Poor vision and conditions such as cataracts can make you more likely to have a fall. If you wear glasses, make sure to get your prescription updated if your vision changes. Medicine review. Work with your health care provider to regularly review all of the medicines you are taking, including over-the-counter medicines. Ask your health care provider about any side effects that may make you more likely to have a fall. Tell your health care provider if any medicines that you take make you feel dizzy or sleepy. Strength and balance checks. Your health care provider may recommend certain tests to  check your strength and balance while standing, walking, or changing positions. Foot health exam. Foot pain and numbness, as well as not wearing proper footwear, can make you more likely to have a fall. Screenings, including: Osteoporosis screening. Osteoporosis is a condition that causes the bones to get weaker and break more easily. Blood pressure screening. Blood pressure changes and medicines to control blood pressure can make you feel dizzy. Depression screening. You may be more likely to have a fall if you have a fear of falling, feel depressed, or feel unable to do activities that you used to do. Alcohol use screening. Using too much alcohol can affect your balance and may make you more likely to have a fall. Follow these instructions at home: Lifestyle Do not drink alcohol if: Your health care provider tells you not to drink. If you drink alcohol: Limit how much you have to: 0-1 drink a day for women. 0-2 drinks a day for men. Know how much alcohol is in your drink. In the [place] one drink equals one 12 oz bottle of beer (355 mL), one 5 oz glass of wine (148 mL), or one 1 oz glass of hard liquor (44 mL). Do not use any products that contain nicotine or tobacco. These products include cigarettes, chewing tobacco, and vaping devices, such as e-cigarettes. If you need help quitting, ask your health care provider. Activity  Follow a regular exercise program to stay fit. This will help you maintain your balance. Ask your health care provider what  types of exercise are appropriate for you. If you need a cane or walker, use it as recommended by your health care provider. Wear supportive shoes that have nonskid soles. Safety  Remove any tripping hazards, such as rugs, cords, and clutter. Install safety equipment such as grab bars in bathrooms and safety rails on stairs. Keep rooms and walkways well-lit. General instructions Talk with your health care provider about your risks for falling.  Tell your health care provider if: You fall. Be sure to tell your health care provider about all falls, even ones that seem minor. You feel dizzy, tiredness (fatigue), or off-balance. Take over-the-counter and prescription medicines only as told by your health care provider. These include supplements. Eat a healthy diet and maintain a healthy weight. A healthy diet includes low-fat dairy products, low-fat (lean) meats, and fiber from whole grains, beans, and lots of fruits and vegetables. Stay current with your vaccines. Schedule regular health, dental, and eye exams. Summary Having a healthy lifestyle and getting preventive care can help to protect your health and wellness after age 1. Screening and testing are the best way to find a health problem early and help you avoid having a fall. Early diagnosis and treatment give you the best chance for managing medical conditions that are more common for people who are older than age 43. Falls are a major cause of broken bones and head injuries in people who are older than age 1. Take precautions to prevent a fall at home. Work with your health care provider to learn what changes you can make to improve your health and wellness and to prevent falls. This information is not intended to replace advice given to you by your health care provider. Make sure you discuss any questions you have with your health care provider. Document Revised: 10/21/2020 Document Reviewed: 10/21/2020 Elsevier Patient Education  [place].

## 2021-12-30 NOTE — Assessment & Plan Note (Signed)

## 2021-12-30 NOTE — Assessment & Plan Note (Signed)
Reassuring uro eval. Seeing yearly.

## 2021-12-30 NOTE — Assessment & Plan Note (Signed)
Chronic, stable on current regimen - continue losartan, amlodipine.

## 2021-12-30 NOTE — Assessment & Plan Note (Signed)
Difficulty getting appt with [facility] - my referral coordinator called today, pt will call and schedule appt today, will let me know if any difficulty getting this scheduled.

## 2021-12-30 NOTE — Assessment & Plan Note (Signed)
Advanced planning discussion - has this at home. HCPOA would be wife then oldest child. Asked to bring [place] copy.

## 2021-12-30 NOTE — Assessment & Plan Note (Addendum)
Saw sports medicine - dx severe osteoarthritis, saw ortho Dr Tamera Punt, rec against surgery at this time.

## 2021-12-30 NOTE — Assessment & Plan Note (Signed)
Bruit - referred from heart murmur

## 2021-12-31 LAB — ALDOSTERONE + RENIN ACTIVITY W/ RATIO
ALDO / PRA Ratio: 2.1 Ratio (ref 0.9–28.9)
Aldosterone: 12 ng/dL
Renin Activity: 5.6 ng/mL/h (ref 0.25–5.82)

## 2022-01-11 ENCOUNTER — Encounter: Payer: Self-pay | Admitting: Endocrinology

## 2022-01-14 MED ORDER — METFORMIN HCL 1000 MG PO TABS: ORAL_TABLET | ORAL | 2 refills | Status: DC

## 2022-01-21 ENCOUNTER — Other Ambulatory Visit: Payer: Self-pay | Admitting: Family Medicine

## 2022-01-21 ENCOUNTER — Other Ambulatory Visit: Payer: Self-pay | Admitting: Cardiovascular Disease

## 2022-01-29 ENCOUNTER — Encounter: Payer: Self-pay | Admitting: Endocrinology

## 2022-01-30 ENCOUNTER — Other Ambulatory Visit: Payer: Medicare Other

## 2022-01-30 NOTE — Telephone Encounter (Signed)
Pt contacted and scheduled for lab appt.

## 2022-02-03 ENCOUNTER — Ambulatory Visit (INDEPENDENT_AMBULATORY_CARE_PROVIDER_SITE_OTHER): Payer: Medicare Other | Admitting: Endocrinology

## 2022-02-03 ENCOUNTER — Encounter: Payer: Self-pay | Admitting: Endocrinology

## 2022-02-03 VITALS — BP 124/78 | HR 66 | Ht 71.0 in | Wt 232.2 lb

## 2022-02-03 DIAGNOSIS — E782 Mixed hyperlipidemia: Secondary | ICD-10-CM

## 2022-02-03 DIAGNOSIS — E1169 Type 2 diabetes mellitus with other specified complication: Secondary | ICD-10-CM

## 2022-02-03 DIAGNOSIS — I6523 Occlusion and stenosis of bilateral carotid arteries: Secondary | ICD-10-CM | POA: Diagnosis not present

## 2022-02-03 DIAGNOSIS — E669 Obesity, unspecified: Secondary | ICD-10-CM | POA: Diagnosis not present

## 2022-02-03 DIAGNOSIS — I10 Essential (primary) hypertension: Secondary | ICD-10-CM | POA: Diagnosis not present

## 2022-02-03 NOTE — Progress Notes (Signed)
Patient ID: Evan [name], male   DOB: [birth year], 66 y.o.   MRN: [id number]          Reason for Appointment:Type 2 Diabetes and related problems   History of Present Illness:          Date of diagnosis of type 2 diabetes mellitus: 2012        Background history:   Diagnosed incidentally with a high blood sugar Has been on metformin since diagnosis and subsequently Amaryl had been added He has had sporadic follow-up for the last few years His A1c had been below 7 to about 08/2015 Subsequently in 9/17 his A1c had gone up to 8%  Recent history:      Non-insulin hypoglycemic drugs : Farxiga 10 mg,  metformin 1.5 g at dinner, Ozempic 2  mg weekly  His A1c is 6.5   Current management, blood sugar patterns and problems identified: Although his A1c is higher his recent blood sugars are excellent Unable to compare with previous visit as he did not bring his monitor previously He is now on 2 mg Ozempic, initially had mild nausea but not now Is still trying to walk about 5 to 6 miles a day on most days weight is down 4 pounds recently  Currently checking blood sugars by rotation at different times with mostly in the morning and highest reading 144  He is able to maintain his portion control and restrict carbohydrates at all meals He is asking about nonprescription remedies and possibly using Mounjaro  Meals: Usually skipping breakfast, dinner is usually a protein with vegetables or salad, low-fat intake       Side effects from medications have been: None  Glucose monitoring:  done 1 or less times a day         Glucometer: One Touch Verio  .      Blood Glucose readings from download:   PRE-MEAL Fasting Lunch Dinner Bedtime Overall  Glucose range: 82-128   99-120 82-144  Mean/median: 115    115   POST-MEAL PC Breakfast PC Lunch PC Dinner  Glucose range:   106-144  Mean/median: 122  120   Prior   PRE-MEAL Fasting Lunch Dinner Bedtime Overall  Glucose range: 120-150  120 120    Mean/median:          Self-care: The diet that the patient has been following is: tries to reduce high-fat and fried foods, Carbohydrates and avoiding drinks with sugar .       Dietician visit, most recent: 02/2017  Weight history: Highest previous weight 287 in 2019  Wt Readings from Last 3 Encounters:  02/03/22 232 lb 3.2 oz (105.3 kg)  12/30/21 236 lb 8 oz (107.3 kg)  10/31/21 235 lb (106.6 kg)    Glycemic control:   Lab Results  Component Value Date   HGBA1C 6.5 12/23/2021   HGBA1C 6.1 10/29/2021   HGBA1C 6.8 (H) 07/29/2021   Lab Results  Component Value Date   MICROALBUR <0.7 07/29/2021   LDLCALC 82 12/25/2020   CREATININE 1.00 12/23/2021   CREATININE 1.00 12/23/2021   Lab Results  Component Value Date   MICRALBCREAT 1.5 07/29/2021    Lab Results  Component Value Date   FRUCTOSAMINE 272 12/25/2020   FRUCTOSAMINE 224 12/19/2019   FRUCTOSAMINE 239 12/13/2017      Allergies as of 02/03/2022       Reactions   Penicillins    Respiratory distress as infant        Medication  List        Accurate as of February 03, 2022 10:12 AM. If you have any questions, ask your nurse or doctor.          amLODipine 5 MG tablet Commonly known as: NORVASC TAKE 1 TABLET (5 MG TOTAL) BY MOUTH DAILY.   ascorbic acid 500 MG tablet Commonly known as: VITAMIN C Take 500 mg by mouth daily.   aspirin EC 81 MG tablet Take 81 mg by mouth every evening. Swallow whole.   cephALEXin 500 MG capsule Commonly known as: KEFLEX TAKE 4 CAPSULES BY MOUTH 60 MINUTES PRIOR TO ANY DENTAL APPOINTMENTS   Farxiga 10 MG Tabs tablet Generic drug: dapagliflozin propanediol TAKE 1 TABLET (10 MG TOTAL) BY MOUTH EVERY MORNING.   fluticasone 50 MCG/ACT nasal spray Commonly known as: FLONASE Place 1 spray into both nostrils daily as needed for allergies or rhinitis.   K2 PLUS D3 PO Take 1 tablet by mouth every evening.   losartan 100 MG tablet Commonly known as: COZAAR TAKE 1/2  TABLET BY MOUTH DAILY   Magnesium 250 MG Tabs Take 250 mg by mouth every evening.   metFORMIN 1000 MG tablet Commonly known as: GLUCOPHAGE Take 1 and 1/2 tablet in the evening.   OneTouch Verio Flex System w/Device Kit 1 kit by Does not apply route daily. Use to check blood sugars 2 times daily   OneTouch Verio test strip Generic drug: glucose blood USE TO CHECK BLOOD SUGARS 2 TIMES DAILY   rosuvastatin 5 MG tablet Commonly known as: CRESTOR TAKE 1 TABLET BY MOUTH EVERY DAY   Semaglutide (2 MG/DOSE) 8 MG/3ML Sopn Inject 2 mg as directed once a week.   sildenafil 20 MG tablet Commonly known as: REVATIO Take 5 tablets (100 mg total) by mouth daily as needed (relations).   TURMERIC PO Take 1,800 mg by mouth every evening.   Vitamin B-12 2500 MCG Subl Take 2,500 mcg by mouth daily.   Zinc 50 MG Tabs Take 50 mg by mouth every evening.        Allergies:  Allergies  Allergen Reactions   Penicillins     Respiratory distress as infant    Past Medical History:  Diagnosis Date   Adrenal adenoma    Complication of anesthesia    difficulty to weak up   COVID-19 virus infection 05/2020   Depression    intolerant of cymbalta and wellbutrin   Diverticulosis of colon    Esophagitis 03/2015   by EGD   Gastritis 03/2015   by EGD - mild, chronic   Hyperlipidemia    Hypertension    Left ventricular hypertrophy    Lichen simplex chronicus 05/2015   R scrotum [name])   Liver lesion    2022 CTs: There was an incidental finding of a hypervascular lesion in the right lobe of the liver as well as left adrenal nodule. He underwent MRI of the abdomen showing the appearance of the liver favored focal nodular hyperplasia, as well as small left adrenal adenoma.   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 Angelena Form & Dr. Cyndia Bent    Severe aortic stenosis    T2DM (type 2 diabetes mellitus) ([place])    DSME 09/2013    Past Surgical History:  Procedure  Laterality Date   COLONOSCOPY  2006   diverticulosis per pt   COLONOSCOPY  03/2015   2 polyps, rpt 5 yrs [name])   ESOPHAGOGASTRODUODENOSCOPY  03/2015   mild chronic gastritis, esophagitis    INTRAOPERATIVE TRANSTHORACIC ECHOCARDIOGRAM Left 10/29/2020   Procedure: INTRAOPERATIVE TRANSTHORACIC ECHOCARDIOGRAM;  Surgeon: Burnell Blanks, MD;  Location: [place];  Service: Open Heart Surgery;  Laterality: Left;   LUMBAR LAMINECTOMY  1989   L5-S1   RIGHT/LEFT HEART CATH AND CORONARY ANGIOGRAPHY N/A 08/15/2020   Procedure: RIGHT/LEFT HEART CATH AND CORONARY ANGIOGRAPHY;  Surgeon: Burnell Blanks, MD;  Location: [place] [facility];  Service: Cardiovascular;  Laterality: N/A;   TRANSCATHETER AORTIC VALVE REPLACEMENT, TRANSFEMORAL Bilateral 10/29/2020   Procedure: TRANSCATHETER AORTIC VALVE REPLACEMENT, TRANSFEMORAL;  Surgeon: Burnell Blanks, MD;  Location: [place];  Service: Open Heart Surgery;  Laterality: Bilateral;   ULTRASOUND GUIDANCE FOR VASCULAR ACCESS Bilateral 10/29/2020   Procedure: ULTRASOUND GUIDANCE FOR VASCULAR ACCESS;  Surgeon: Burnell Blanks, MD;  Location: [place];  Service: Open Heart Surgery;  Laterality: Bilateral;    Family History  Problem Relation Age of Onset   Alzheimer's disease Father    Hypertension Father    Stroke Father    Hyperlipidemia Father    Prostate cancer Brother    Diabetes Brother    Alcohol abuse Brother    CAD Brother    Coronary artery disease Maternal Uncle    Schizophrenia Paternal Uncle    Emphysema Mother    Alcohol abuse Mother    Stroke Maternal Grandmother    Stroke Maternal Grandfather    Diabetes Maternal Grandfather    Stroke Paternal Grandmother    Stroke Paternal Grandfather     Social History:  reports that he has never smoked. He has  never used smokeless tobacco. He reports that he does not currently use alcohol. He reports that he does not use drugs.   Review of Systems  Lipid history: On Crestor 5 mg from his PCP and his LDL is consistently below 100 He says his high triglycerides were related to alcohol intake last month and he has stopped any alcohol intake   Lab Results  Component Value Date   CHOL 189 12/23/2021   HDL 64.40 12/23/2021   LDLCALC 82 12/25/2020   LDLDIRECT 89.0 12/23/2021   TRIG 263.0 (H) 12/23/2021   CHOLHDL 3 12/23/2021           Hypertension: On Losartan 100 mg, 1/2 tablet daily with good control  Has a home monitor also  BP Readings from Last 3 Encounters:  02/03/22 124/78  12/30/21 126/70  10/31/21 120/70    Most recent foot exam: 8/23  ADRENAL adenoma: He has a small left 1.8 cm adrenal adenoma since 2018 and his PCP checked aldosterone levels Also there is no change in size of the adenoma and likely to be insignificant   Physical Examination:  BP 124/78   Pulse 66   Ht _0  (1.803 m)   Wt 232 lb 3.2 oz (105.3 kg)   SpO2 99%   BMI 32.39 kg/m    ASSESSMENT:  Diabetes type 2, non-insulin-dependent  See history of present illness for detailed discussion of current diabetes management, blood sugar patterns and problems identified     A1c is 6.5 as of last month   Currently on 3 drug regimen including Farxiga, metformin 1 g daily and Ozempic 2 mg weekly  His blood sugars are excellent at home even though A1c is  slightly higher than before He is watching his diet carefully and also exercising with a 5 to 6 mile walking routine Highest blood sugar only 144 recently   HYPERTENSION: well controlled  Renal function normal with Farxiga  High triglycerides: He thinks this was from more alcohol intake which he has cut down  PLAN:    He will continue 2 mg on the Ozempic Since his blood sugars are excellent in the last month does not need any adjustment or  change Encouraged him to stay on his lifestyle with good diet, regular exercise and minimizing alcohol intake   Follow-up in 4 months     There are no Patient Instructions on file for this visit.   Elayne Snare 02/03/2022, 10:12 AM   Note: This office note was prepared with Dragon voice recognition system technology. Any transcriptional errors that result from this process are unintentional.

## 2022-02-09 ENCOUNTER — Other Ambulatory Visit: Payer: Self-pay | Admitting: Endocrinology

## 2022-02-09 DIAGNOSIS — E1169 Type 2 diabetes mellitus with other specified complication: Secondary | ICD-10-CM

## 2022-02-12 ENCOUNTER — Ambulatory Visit: Payer: Medicare Other | Attending: Internal Medicine | Admitting: Internal Medicine

## 2022-02-12 ENCOUNTER — Encounter: Payer: Self-pay | Admitting: Internal Medicine

## 2022-02-12 ENCOUNTER — Encounter: Payer: Self-pay | Admitting: Family Medicine

## 2022-02-12 VITALS — BP 138/98 | HR 65 | Ht 71.0 in | Wt 235.2 lb

## 2022-02-12 DIAGNOSIS — E785 Hyperlipidemia, unspecified: Secondary | ICD-10-CM | POA: Insufficient documentation

## 2022-02-12 DIAGNOSIS — Z952 Presence of prosthetic heart valve: Secondary | ICD-10-CM

## 2022-02-12 DIAGNOSIS — I471 Supraventricular tachycardia, unspecified: Secondary | ICD-10-CM

## 2022-02-12 DIAGNOSIS — R0789 Other chest pain: Secondary | ICD-10-CM

## 2022-02-12 DIAGNOSIS — R600 Localized edema: Secondary | ICD-10-CM

## 2022-02-12 DIAGNOSIS — E1169 Type 2 diabetes mellitus with other specified complication: Secondary | ICD-10-CM | POA: Diagnosis not present

## 2022-02-12 DIAGNOSIS — Q2381 Bicuspid aortic valve: Secondary | ICD-10-CM

## 2022-02-12 DIAGNOSIS — Z79899 Other long term (current) drug therapy: Secondary | ICD-10-CM | POA: Diagnosis not present

## 2022-02-12 DIAGNOSIS — G4733 Obstructive sleep apnea (adult) (pediatric): Secondary | ICD-10-CM

## 2022-02-12 DIAGNOSIS — R55 Syncope and collapse: Secondary | ICD-10-CM

## 2022-02-12 DIAGNOSIS — Q231 Congenital insufficiency of aortic valve: Secondary | ICD-10-CM | POA: Insufficient documentation

## 2022-02-12 DIAGNOSIS — I350 Nonrheumatic aortic (valve) stenosis: Secondary | ICD-10-CM | POA: Diagnosis not present

## 2022-02-12 DIAGNOSIS — I10 Essential (primary) hypertension: Secondary | ICD-10-CM | POA: Diagnosis not present

## 2022-02-12 NOTE — Patient Instructions (Signed)
Medication Instructions:  No Changes In Medications at this time.  *If you need a refill on your cardiac medications before your next appointment, please call your pharmacy*  Follow-Up: At [facility], you and your health needs are our priority.  As part of our continuing mission to provide you with exceptional heart care, we have created designated Provider Care Teams.  These Care Teams include your primary Cardiologist (physician) and Advanced Practice Providers (APPs -  Physician Assistants and Nurse Practitioners) who all work together to provide you with the care you need, when you need it.  Your next appointment:   1 year(s)  The format for your next appointment:   In Person  Provider:   Gayatri A Acharya, MD          

## 2022-02-12 NOTE — Progress Notes (Signed)
Cardiology Office Note:    Date:  02/12/2022  ID:  [name], DOB 1956/04/04, MRN [id number]  PCP:  Ria Bush, MD  Cardiologist:  Elouise Munroe, MD  Electrophysiologist:  None   Referring MD: Ria Bush, MD   Chief Complaint/Reason for Referral: Severe aortic valve stenosis of BAV, s/p TAVR  History of Present Illness:    TSUGIO ELISON is a 66 y.o. male with a history of depression, hyperlipidemia, hypertension, diabetes well controlled, nonalcoholic fatty liver disease, who presents today for evaluation of systolic murmur found to have aortic valve stenosis, S/P TAVR 10/29/20.  Echo 07/11/20 showed LVEF=55-60%, severe LVH, bicuspid AoV with severe calcification of the aortic valve leaflets with a mean gradient of 41 mmHg, peak gradient 59 mmHg. AVA 1.24 cm2, dimensionless index 0.35. He was referred to Dr. Angelena Form with the structural heart team. At this visit, he described resting chest pain but no exertional dyspnea or chest pain. Cardiac cath 08/15/20 showed no evidence of CAD. Gated cardiac CT 08/22/20 with bicuspid aortic valve with thickened and calcified leaflets with partial fusion of the right and left coronary cusps with severely reduced cusp separation c/w severe AS. Valve calcium score of 2886. AV area 559 mm2 suitable for a 29 mm Edwards Sapien 3 valve (Edwards rep has commented on the use of a 26 mm Sapien 3 valve).  CTA of the chest/abdomen and pelvis with anatomy suitable for transfemoral approach to TAVR. Incidental finding of a hypervascular lesion in the right lobe of the liver as well as left adrenal nodule. Follow up MRI favored focal nodular hyperplasia and confirmed the presence of an adrenal nodule. He was seen back in the office in April and reported chest pressure and palpitations. The patient was not interested in pursuing open heart surgery. He was felt to be a reasonable candidate for TAVR which was scheduled for 10/29/20. He was seen in the ER for  chest pain and shortness of breath. He ruled out for MI and overall examination was normal. He was discharged home with plans to return for planned TAVR 10/29/20.   He was evaluated by the multidisciplinary valve team and underwent successful TAVR with a 29 mm Edwards Sapien 3 THV via the TF approach on 10/29/20. Post operative echo showed EF 55%, normally functioning TAVR with a mean gradient of 6.0 mmHg and trivial PVL. He was discharged home on aspirin and started on Plavix 75 mg daily. He has done well in follow up with improvement in chest pain.   He was seen in the office on 05/28/21 by Dr. Harriet Masson for evaluation of syncope using the bathroom. Zio XT showed SVT into the 200s and he was started on Toprol XL 12.35m daily. Also started on Lasix 2x a week for LE edema.    Today he presents to clinic for follow up. He never took the Toprol xl, cutting back on alcohol and caffeine seemed to help. Never took lasix and not having issues with swelling. No CP or SOB. Trace LE edema, no orthopnea or PND. Occasional dizziness when he bends over but no syncope. Recommended compression socks to assist since BP still mildly elevated. No blood in stool or urine. No palpitations.   Past Medical History:  Diagnosis Date   Adrenal adenoma    Complication of anesthesia    difficulty to weak up   COVID-19 virus infection 05/2020   Depression    intolerant of cymbalta and wellbutrin   Diverticulosis of colon  Esophagitis 03/2015   by EGD   Gastritis 03/2015   by EGD - mild, chronic   Hyperlipidemia    Hypertension    Left ventricular hypertrophy    Lichen simplex chronicus 05/2015   R scrotum [name])   Liver lesion    2022 CTs: There was an incidental finding of a hypervascular lesion in the right lobe of the liver as well as left adrenal nodule. He underwent MRI of the abdomen showing the appearance of the liver favored focal nodular hyperplasia, as well as small left adrenal adenoma.   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 Angelena Form & Dr. Cyndia Bent    Severe aortic stenosis    T2DM (type 2 diabetes mellitus) ([place])    DSME 09/2013    Past Surgical History:  Procedure Laterality Date   COLONOSCOPY  2006   diverticulosis per pt   COLONOSCOPY  03/2015   2 polyps, rpt 5 yrs [name])   ESOPHAGOGASTRODUODENOSCOPY  03/2015   mild chronic gastritis, esophagitis    INTRAOPERATIVE TRANSTHORACIC ECHOCARDIOGRAM Left 10/29/2020   Procedure: INTRAOPERATIVE TRANSTHORACIC ECHOCARDIOGRAM;  Surgeon: Burnell Blanks, MD;  Location: [place];  Service: Open Heart Surgery;  Laterality: Left;   LUMBAR LAMINECTOMY  1989   L5-S1   RIGHT/LEFT HEART CATH AND CORONARY ANGIOGRAPHY N/A 08/15/2020   Procedure: RIGHT/LEFT HEART CATH AND CORONARY ANGIOGRAPHY;  Surgeon: Burnell Blanks, MD;  Location: [place] [facility];  Service: Cardiovascular;  Laterality: N/A;   TRANSCATHETER AORTIC VALVE REPLACEMENT, TRANSFEMORAL Bilateral 10/29/2020   Procedure: TRANSCATHETER AORTIC VALVE REPLACEMENT, TRANSFEMORAL;  Surgeon: Burnell Blanks, MD;  Location: [place];  Service: Open Heart Surgery;  Laterality: Bilateral;   ULTRASOUND GUIDANCE FOR VASCULAR ACCESS Bilateral 10/29/2020   Procedure: ULTRASOUND GUIDANCE FOR VASCULAR ACCESS;  Surgeon: Burnell Blanks, MD;  Location: [place];  Service: Open Heart Surgery;  Laterality: Bilateral;    Current Medications: Current Meds  Medication Sig   amLODipine (NORVASC) 5 MG tablet TAKE 1 TABLET (5 MG TOTAL) BY MOUTH DAILY.   aspirin EC 81 MG tablet Take 81 mg by mouth every evening. Swallow whole.   Blood Glucose Monitoring Suppl (ONETOUCH VERIO FLEX SYSTEM) w/Device KIT 1 kit by Does not apply route daily. Use to check blood sugars 2 times daily    cephALEXin (KEFLEX) 500 MG capsule TAKE 4 CAPSULES BY MOUTH 60 MINUTES PRIOR TO ANY DENTAL APPOINTMENTS   Cyanocobalamin (VITAMIN B-12) 2500 MCG SUBL Take 2,500 mcg by mouth daily.   FARXIGA 10 MG TABS tablet TAKE 1 TABLET (10 MG TOTAL) BY MOUTH EVERY MORNING.   fluticasone (FLONASE) 50 MCG/ACT nasal spray Place 1 spray into both nostrils daily as needed for allergies or rhinitis.   glucose blood (ONETOUCH VERIO) test strip USE TO CHECK BLOOD SUGARS 2 TIMES DAILY   losartan (COZAAR) 100 MG tablet TAKE 1/2 TABLET BY MOUTH DAILY   Magnesium 250 MG TABS Take 250 mg by mouth every evening.   metFORMIN (GLUCOPHAGE) 1000 MG tablet Take 1 and 1/2 tablet in the evening.   OZEMPIC, 2 MG/DOSE, 8 MG/3ML SOPN INJECT 2 MG AS DIRECTED ONCE A WEEK.   rosuvastatin (CRESTOR) 5 MG tablet TAKE 1 TABLET BY MOUTH EVERY DAY   sildenafil (REVATIO) 20 MG tablet Take 5 tablets (  100 mg total) by mouth daily as needed (relations).   TURMERIC PO Take 1,800 mg by mouth every evening.   vitamin C (ASCORBIC ACID) 500 MG tablet Take 500 mg by mouth daily.   Vitamin D-Vitamin K (K2 PLUS D3 PO) Take 1 tablet by mouth every evening.   Zinc 50 MG TABS Take 50 mg by mouth every evening.     Allergies:   Penicillins   Social History   Tobacco Use   Smoking status: Never   Smokeless tobacco: Never  Substance Use Topics   Alcohol use: Not Currently    Alcohol/week: 0.0 standard drinks of alcohol    Comment: occasional   Drug use: No     Family History: The patient's family history includes Alcohol abuse in his brother and mother; Alzheimer's disease in his father; CAD in his brother; Coronary artery disease in his maternal uncle; Diabetes in his brother and maternal grandfather; Emphysema in his mother; Hyperlipidemia in his father; Hypertension in his father; Prostate cancer in his brother; Schizophrenia in his paternal uncle; Stroke in his father, maternal grandfather, maternal grandmother, paternal grandfather, and  paternal grandmother.  ROS:   Please see the history of present illness.    (+) Chest pressure (+) Dizziness (+) Fatigue All other systems reviewed and are negative.  EKGs/Labs/Other Studies Reviewed:    The following studies were reviewed today:  Vas US Carotid Duplex Bilateral 09/25/2020: Right Carotid: The extracranial vessels were near-normal with only minimal  wall                 thickening or plaque.   Left Carotid: The extracranial vessels were near-normal with only minimal  wall                thickening or plaque.   Vertebrals:  Bilateral vertebral arteries demonstrate antegrade flow.  Subclavians: Normal flow hemodynamics were seen in bilateral subclavian               arteries.  Right/Left Heart Cath 08/15/2020: 1. No angiographic evidence of CAD 2. Severe aortic stenosis by echo with likely bicuspid aortic valve. Cath with mean gradient 34 mmHg, peak to peak gradient 44 mmHg.    Recommendations: Will continue workup for AVR vs TAVR. Will plan CT scans and then will review with valve team. At this time he is relatively asymptomatic and we may be able to follow his aortic stenosis for now.    Echo 07/11/2020: 1. Aortic valve is bicuspid or functionally bicuspid. Aortic stenosis is  severe with peak/mean transaortic gradients 68/41 mmHg.   2. Left ventricular ejection fraction, by estimation, is 55 to 60%. The  left ventricle has normal function. The left ventricle has no regional  wall motion abnormalities. There is severe concentric left ventricular  hypertrophy. Left ventricular diastolic   parameters are consistent with Grade I diastolic dysfunction (impaired  relaxation). Elevated left atrial pressure.   3. Right ventricular systolic function is normal. The right ventricular  size is normal. There is normal pulmonary artery systolic pressure. The  estimated right ventricular systolic pressure is 78.2 mmHg.   4. Left atrial size was mildly dilated.   5. The  mitral valve is normal in structure. Trivial mitral valve  regurgitation. No evidence of mitral stenosis.   6. The aortic valve is bicuspid. There is severe calcifcation of the  aortic valve. There is severe thickening of the aortic valve. Aortic valve  regurgitation is mild. Severe aortic valve stenosis. Aortic valve mean  gradient measures 41.0 mmHg.   7. The inferior vena cava is normal in size with greater than 50%  respiratory variability, suggesting right atrial pressure of 3 mmHg.   EKG: 02/12/22 - NSR 10/22/2020: EKG was not ordered today.  07/25/2020: NSR  Recent Labs: 05/28/2021: Hemoglobin 15.0; Magnesium 2.0; Platelets 241 12/23/2021: ALT 17; BUN 15; BUN 15; Creatinine, Ser 1.00; Creatinine, Ser 1.00; Potassium 4.8; Potassium 4.8; Sodium 136; Sodium 136  Recent Lipid Panel    Component Value Date/Time   CHOL 189 12/23/2021 0907   TRIG 263.0 (H) 12/23/2021 0907   HDL 64.40 12/23/2021 0907   CHOLHDL 3 12/23/2021 0907   VLDL 52.6 (H) 12/23/2021 0907   LDLCALC 82 12/25/2020 1110   LDLDIRECT 89.0 12/23/2021 0907    Physical Exam:    VS:  BP (!) 138/98   Pulse 65   Ht '5\' 11"'  (1.803 m)   Wt 235 lb 3.2 oz (106.7 kg)   SpO2 95%   BMI 32.80 kg/m     Wt Readings from Last 5 Encounters:  02/12/22 235 lb 3.2 oz (106.7 kg)  02/03/22 232 lb 3.2 oz (105.3 kg)  12/30/21 236 lb 8 oz (107.3 kg)  10/31/21 235 lb (106.6 kg)  10/22/21 230 lb (104.3 kg)    Constitutional: No acute distress Eyes: sclera non-icteric, normal conjunctiva and lids ENMT: normal dentition, moist mucous membranes Cardiovascular: regular rhythm, normal rate, no murmur. Radial pulses normal bilaterally. No jugular venous distention.  Respiratory: clear to auscultation bilaterally GI : normal bowel sounds, soft and nontender. No distention.   MSK: extremities warm, well perfused. No edema.  NEURO: grossly nonfocal exam, moves all extremities. PSYCH: alert and oriented x 3, normal mood and affect.    ASSESSMENT:    1. Severe aortic stenosis   2. Bicuspid aortic valve   3. S/P TAVR (transcatheter aortic valve replacement)   4. Essential hypertension   5. SVT (supraventricular tachycardia) ([place])   6. Syncope and collapse   7. Bilateral leg edema   8. Chest pressure   9. Medication management   10. OSA (obstructive sleep apnea)   11. Hyperlipidemia associated with type 2 diabetes mellitus (HCC)     PLAN:    Severe aortic valve stenosis of BAV S/p TAVR -s/p TAVR on 10/29/2020.  Stable. Followed by [facility] K. Grandville Silos PA. -Continue aspirin 81 mg daily and statin -Discussed again family screening of first deg relatives for BAV and aortopathy. His children live locally and I would be happy to see them for echo and screening consult.   SVT Syncope - No recurrence, no symptoms. Not on medication therapy. Observe.  Essential hypertension -  Hyperlipidemia associated with type 2 diabetes mellitus (HCC)  -Blood pressure is overall stable, would continue losartan 50 mg daily, amlodipine 5 mg daily  Hyperlipidemia -Continue Crestor 5 mg daily. Triglycerides elevated but he recently cut back on alcohol and increased dose of Ozempic, suspect this will only improve with time. Recheck with PCP in 6 mo.  LE edema -venous varicosities, compression socks recommended. May be amlodipine effect but needed for BP.  Chest pain/pressure - minimal recurrence. Observe.   Total time of encounter: 30 minutes total time of encounter, including 20 minutes spent in face-to-face patient care on the date of this encounter. This time includes coordination of care and counseling regarding above mentioned problem list. Remainder of non-face-to-face time involved reviewing chart documents/testing relevant to the patient encounter and documentation in the medical record. I have independently reviewed documentation  from referring provider.   Cherlynn Kaiser, MD, [facility]      Medication Adjustments/Labs and Tests Ordered: Current medicines are reviewed at length with the patient today.  Concerns regarding medicines are outlined above.   Orders Placed This Encounter  Procedures   EKG 12-Lead    No orders of the defined types were placed in this encounter.   Patient Instructions  Medication Instructions:  No Changes In Medications at this time.  *If you need a refill on your cardiac medications before your next appointment, please call your pharmacy*  Follow-Up: At [facility], you and your health needs are our priority.  As part of our continuing mission to provide you with exceptional heart care, we have created designated Provider Care Teams.  These Care Teams include your primary Cardiologist (physician) and Advanced Practice Providers (APPs -  Physician Assistants and Nurse Practitioners) who all work together to provide you with the care you need, when you need it.  Your next appointment:   1 year(s)  The format for your next appointment:   In Person  Provider:   Elouise Munroe, MD

## 2022-02-18 MED ORDER — TADALAFIL 5 MG PO TABS: 5.0000 mg | ORAL_TABLET | Freq: Every day | ORAL | 5 refills | Status: DC

## 2022-02-18 NOTE — Addendum Note (Signed)
Addended by: Ria Bush on: 02/18/2022 05:24 PM   Modules accepted: Orders

## 2022-03-06 DIAGNOSIS — H9113 Presbycusis, bilateral: Secondary | ICD-10-CM | POA: Diagnosis not present

## 2022-03-06 DIAGNOSIS — H903 Sensorineural hearing loss, bilateral: Secondary | ICD-10-CM | POA: Diagnosis not present

## 2022-03-06 DIAGNOSIS — R42 Dizziness and giddiness: Secondary | ICD-10-CM | POA: Diagnosis not present

## 2022-03-12 ENCOUNTER — Other Ambulatory Visit: Payer: Self-pay | Admitting: Endocrinology

## 2022-03-12 DIAGNOSIS — E669 Obesity, unspecified: Secondary | ICD-10-CM

## 2022-03-14 ENCOUNTER — Other Ambulatory Visit: Payer: Self-pay | Admitting: Family Medicine

## 2022-03-14 DIAGNOSIS — E1169 Type 2 diabetes mellitus with other specified complication: Secondary | ICD-10-CM

## 2022-03-16 ENCOUNTER — Telehealth: Payer: Self-pay

## 2022-03-16 DIAGNOSIS — E1169 Type 2 diabetes mellitus with other specified complication: Secondary | ICD-10-CM

## 2022-03-16 NOTE — Progress Notes (Signed)
Chronic Care Management Pharmacy Assistant   Name: Evan [name]  MRN: [id number] DOB: August 08, 1955  Reason for Encounter: CCM (General Adherence)  Recent office visits:  02/12/22 Message: Start: Tadalafil 5 mg  01/11/22 Patient message: Change: Metformin 1000 mg - take 1.5 tablet in evening 12/30/21 AWV Referral to [facility]  Recent consult visits:  02/12/22 Cherlynn Kaiser, MD (Cardiology) Ordered: EKG FU 1 year 02/03/22 Elayne Snare, MD (Endocrinology) DM No med changes FU 4 months Elayne Snare, MD (Endocrinology) DM Increase Ozempic 2 mg FU 4 months Angelena Form, [place] (Cardiology) S/P TAVR (transcatheter aortic valve replacement)   Hospital visits:  None in previous 6 months  Medications: Outpatient Encounter Medications as of 03/16/2022  Medication Sig   amLODipine (NORVASC) 5 MG tablet TAKE 1 TABLET (5 MG TOTAL) BY MOUTH DAILY.   aspirin EC 81 MG tablet Take 81 mg by mouth every evening. Swallow whole.   Blood Glucose Monitoring Suppl (ONETOUCH VERIO FLEX SYSTEM) w/Device KIT 1 kit by Does not apply route daily. Use to check blood sugars 2 times daily   cephALEXin (KEFLEX) 500 MG capsule TAKE 4 CAPSULES BY MOUTH 60 MINUTES PRIOR TO ANY DENTAL APPOINTMENTS   Cyanocobalamin (VITAMIN B-12) 2500 MCG SUBL Take 2,500 mcg by mouth daily.   FARXIGA 10 MG TABS tablet TAKE 1 TABLET (10 MG TOTAL) BY MOUTH EVERY MORNING.   fluticasone (FLONASE) 50 MCG/ACT nasal spray Place 1 spray into both nostrils daily as needed for allergies or rhinitis.   glucose blood (ONETOUCH VERIO) test strip USE TO CHECK BLOOD SUGARS 2 TIMES DAILY   losartan (COZAAR) 100 MG tablet TAKE 1/2 TABLET BY MOUTH DAILY   Magnesium 250 MG TABS Take 250 mg by mouth every evening.   metFORMIN (GLUCOPHAGE) 1000 MG tablet Take 1 and 1/2 tablet in the evening.   OZEMPIC, 2 MG/DOSE, 8 MG/3ML SOPN INJECT 2 MG AS DIRECTED ONCE A WEEK.   rosuvastatin (CRESTOR) 5 MG tablet TAKE 1 TABLET BY MOUTH EVERY DAY   sildenafil (REVATIO) 20  MG tablet Take 5 tablets (100 mg total) by mouth daily as needed (relations).   tadalafil (CIALIS) 5 MG tablet Take 1 tablet (5 mg total) by mouth daily.   TURMERIC PO Take 1,800 mg by mouth every evening.   vitamin C (ASCORBIC ACID) 500 MG tablet Take 500 mg by mouth daily.   Vitamin D-Vitamin K (K2 PLUS D3 PO) Take 1 tablet by mouth every evening.   Zinc 50 MG TABS Take 50 mg by mouth every evening.   No facility-administered encounter medications on file as of 03/16/2022.    Contacted [name] on 03/16/2022 for general disease state and medication adherence call.   Patient is not more than 5 days past due for refill on the following medications per chart history:  [facility] Medications: Medication Name/mg Last Fill Days Supply Farxiga 10 mg   03/02/22 30 Losartan 100 mg  01/22/22 90 Metformin 1000 mg  01/14/22 90  Ozempic 2 mg   02/09/22 28   Rosuvastatin 20 mg  12/25/21 90  What concerns do you have about your medications? Ozempic Producer, television/film/video - I offered to call around to local pharmacies and see if I could get it for the patient. Today is the day he should take his shot, but he does not have it. Advised I would return patient's call with an update!  The patient denies side effects with their medications.   How often do you forget or accidentally miss  a dose? Never  Do you use a pillbox? Yes  Are you having any problems getting your medications from your pharmacy? Yes Ozempic with the national shortage - I offered to call around to local pharmacies and see if I could get it for the patient. Today is the day he should take his shot, but he does not have it. Advised I would return patient's call with an update!  Has the cost of your medications been a concern? No  Since last visit with CPP, the following interventions have been made.  Start: Tadalafil 5 mg Increase Ozempic 2 mg Change: Metformin 1000 mg - take 1.5 tablet in evening  The patient has not had an ED visit  since last contact.   The patient denies problems with their health.   Patient denies concerns or questions for Charlene Brooke, PharmD at this time.   Care Gaps: Annual wellness visit in last year? Yes 12/30/2021 Most Recent BP reading: 138/98 on 02/12/2022  If Diabetic: Most recent A1C reading: 6.5 on 12/23/2021 Last eye exam / retinopathy screening: Up to date Last diabetic foot exam: Up to date  Summary of recommendations from last [place] visit (Date:07/14/21)  Summary: -CCM initial visit: pt endorses compliance with medications as prescribed -BP and BG at home is at goal per reported readings   Recommendations/Changes made from today's visit: -No med changes   Plan: -Rachel will call patient 9 months for adherence review -Pharmacist follow up televisit scheduled for 1 year -PCP 38-monthf/u 12/30/21  Upcoming appointments: CCM appointment on 10/07/2022  LCharlene Brooke CPP notified  AMarijean Niemann RAlderwood ManorAssistant [phone]

## 2022-03-17 ENCOUNTER — Other Ambulatory Visit: Payer: Self-pay | Admitting: Endocrinology

## 2022-03-17 DIAGNOSIS — E1169 Type 2 diabetes mellitus with other specified complication: Secondary | ICD-10-CM

## 2022-03-17 MED ORDER — SEMAGLUTIDE (1 MG/DOSE) 4 MG/3ML ~~LOC~~ SOPN: 1.0000 mg | PEN_INJECTOR | SUBCUTANEOUS | 0 refills | Status: DC

## 2022-03-17 NOTE — Telephone Encounter (Signed)
$'1mg'V$  sent an patient has been notified

## 2022-03-17 NOTE — Telephone Encounter (Addendum)
Patient unable to find Ozempic 2 mg at any local pharmacies. Pharmacy team also called around to different pharmacies and unable to find dose due to national backorder.  Routing to endocrine in case they have a solution -could consider temporary reduction to 1 mg dose which may be easier to find.

## 2022-03-17 NOTE — Addendum Note (Signed)
Addended by: Cinda Quest on: 03/17/2022 05:10 PM   Modules accepted: Orders

## 2022-03-18 MED ORDER — SEMAGLUTIDE (1 MG/DOSE) 4 MG/3ML ~~LOC~~ SOPN: 1.0000 mg | PEN_INJECTOR | SUBCUTANEOUS | 0 refills | Status: DC

## 2022-03-18 NOTE — Telephone Encounter (Signed)
[facility] did not have Ozempic 1 mg. [facility] on [address] does have the dosage available. Transferred medication there for patient to pick up.

## 2022-03-18 NOTE — Addendum Note (Signed)
Addended by: Charlton Haws on: 03/18/2022 02:45 PM   Modules accepted: Orders

## 2022-03-19 DIAGNOSIS — Z23 Encounter for immunization: Secondary | ICD-10-CM | POA: Diagnosis not present

## 2022-03-23 ENCOUNTER — Telehealth: Payer: Self-pay

## 2022-03-23 ENCOUNTER — Encounter: Payer: Self-pay | Admitting: Family Medicine

## 2022-03-23 NOTE — Telephone Encounter (Signed)
$'1mg'S$  and 2 mg ozempic are on backorder. Please advise

## 2022-03-24 MED ORDER — TADALAFIL 10 MG PO TABS: 10.0000 mg | ORAL_TABLET | ORAL | 1 refills | Status: DC | PRN

## 2022-04-07 ENCOUNTER — Encounter: Payer: Self-pay | Admitting: Family Medicine

## 2022-04-07 ENCOUNTER — Encounter: Payer: Self-pay | Admitting: Endocrinology

## 2022-04-13 ENCOUNTER — Other Ambulatory Visit: Payer: Self-pay | Admitting: Endocrinology

## 2022-04-13 DIAGNOSIS — E1169 Type 2 diabetes mellitus with other specified complication: Secondary | ICD-10-CM

## 2022-04-14 ENCOUNTER — Other Ambulatory Visit: Payer: Self-pay | Admitting: Endocrinology

## 2022-04-14 DIAGNOSIS — E1169 Type 2 diabetes mellitus with other specified complication: Secondary | ICD-10-CM

## 2022-04-22 NOTE — Telephone Encounter (Signed)
Patient called in about ozempic. Still unable to get medication. Will stop by office today to get sample of '2mg'$  ozempic.

## 2022-05-09 ENCOUNTER — Other Ambulatory Visit: Payer: Self-pay | Admitting: Endocrinology

## 2022-05-20 DIAGNOSIS — M79641 Pain in right hand: Secondary | ICD-10-CM | POA: Diagnosis not present

## 2022-05-27 DIAGNOSIS — H5203 Hypermetropia, bilateral: Secondary | ICD-10-CM | POA: Diagnosis not present

## 2022-05-27 DIAGNOSIS — E119 Type 2 diabetes mellitus without complications: Secondary | ICD-10-CM | POA: Diagnosis not present

## 2022-05-27 LAB — HM DIABETES EYE EXAM

## 2022-05-29 ENCOUNTER — Encounter: Payer: Self-pay | Admitting: Family Medicine

## 2022-06-01 IMAGING — DX DG SHOULDER 2+V*R*
3 series · 3 of 3 positions shown · non-contrast
Comparison: None.

CLINICAL DATA: Right shoulder pain for several years. Loss of
motion. Assess glenohumeral osteoarthritis.

EXAM:
RIGHT SHOULDER - 2+ VIEW

[shoulder (grashey) ap]
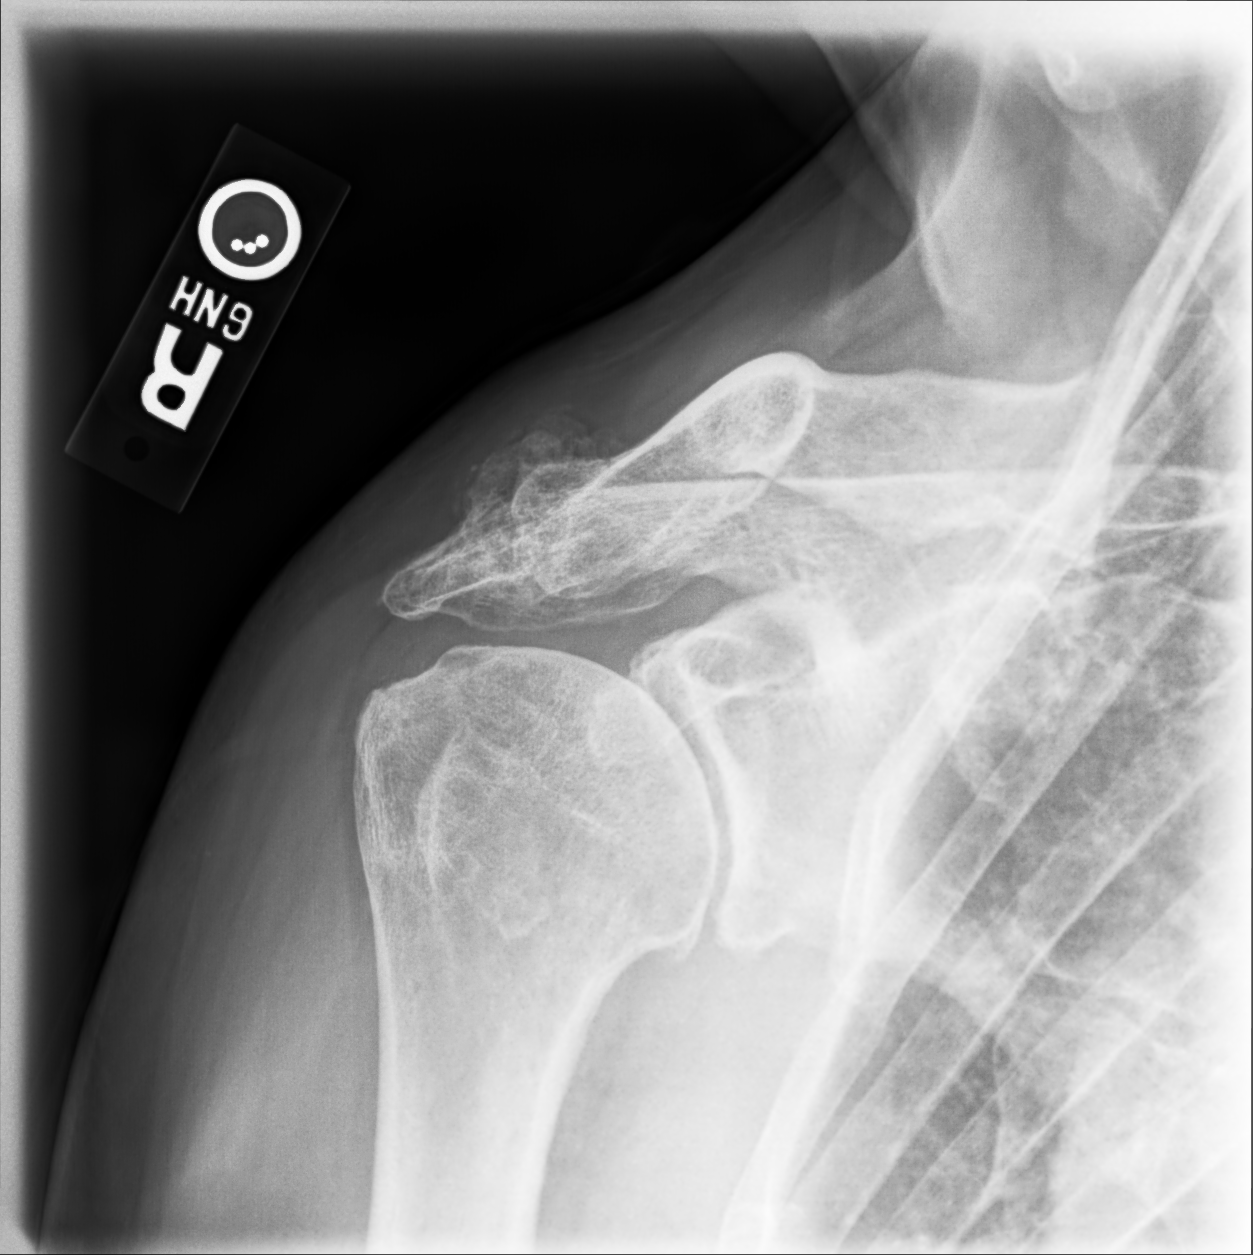

[shoulder y view]
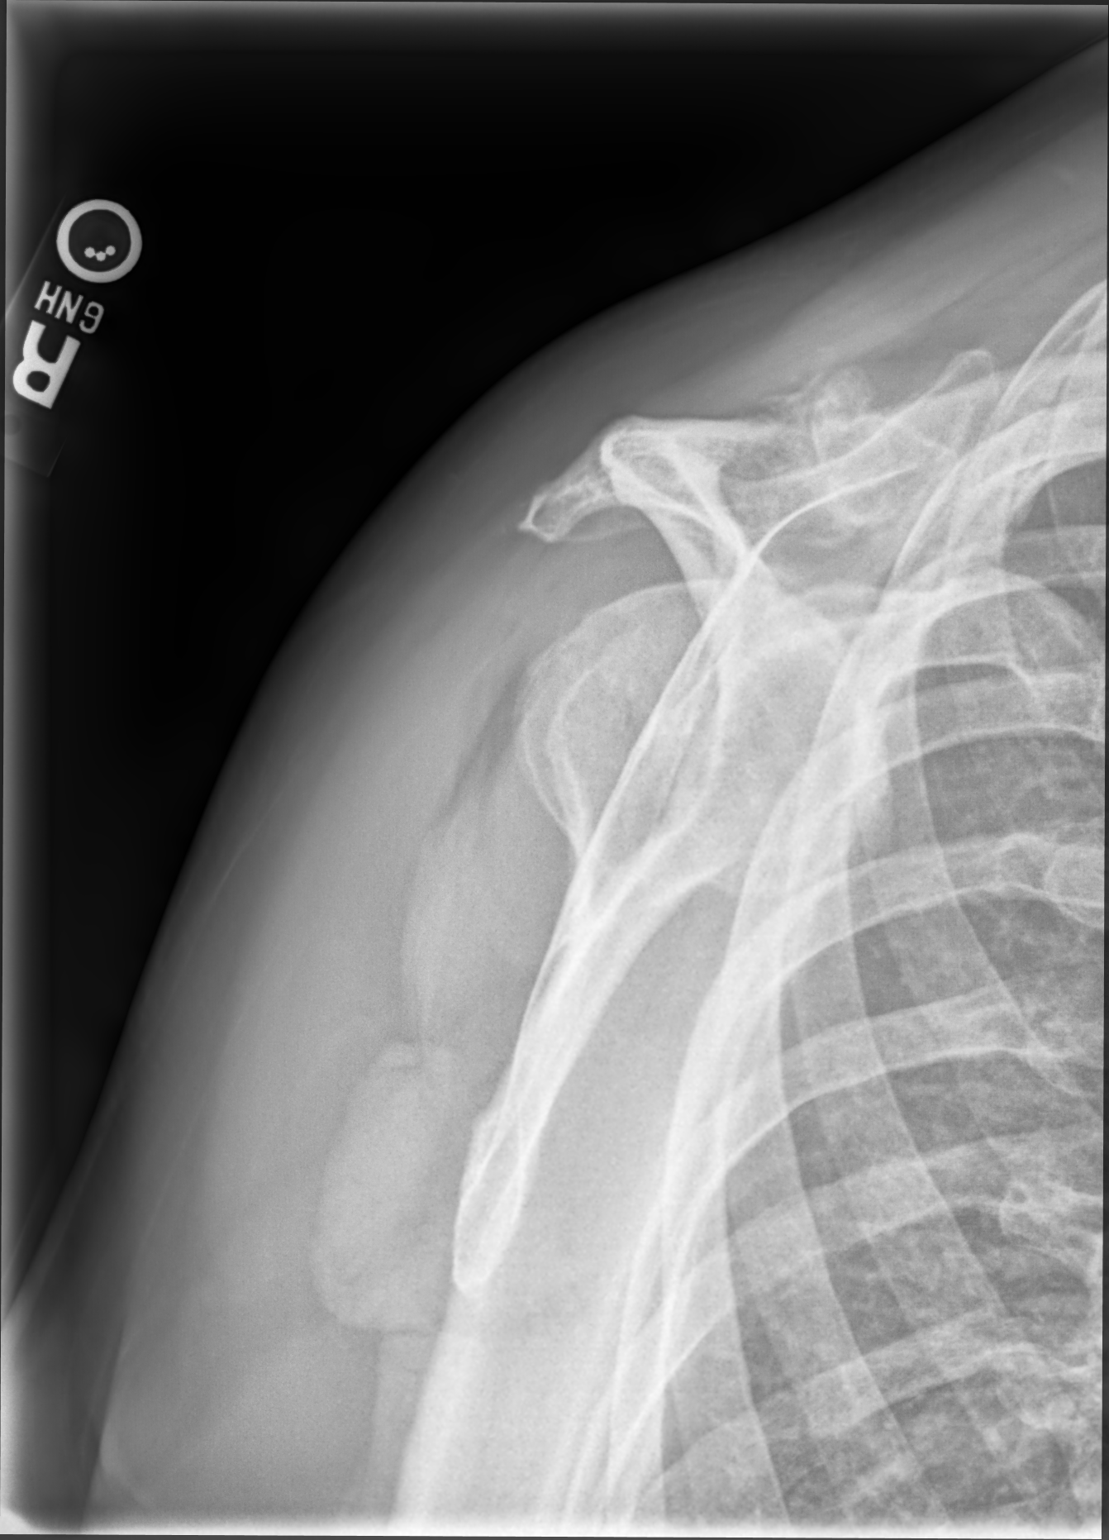

[shoulder axial]
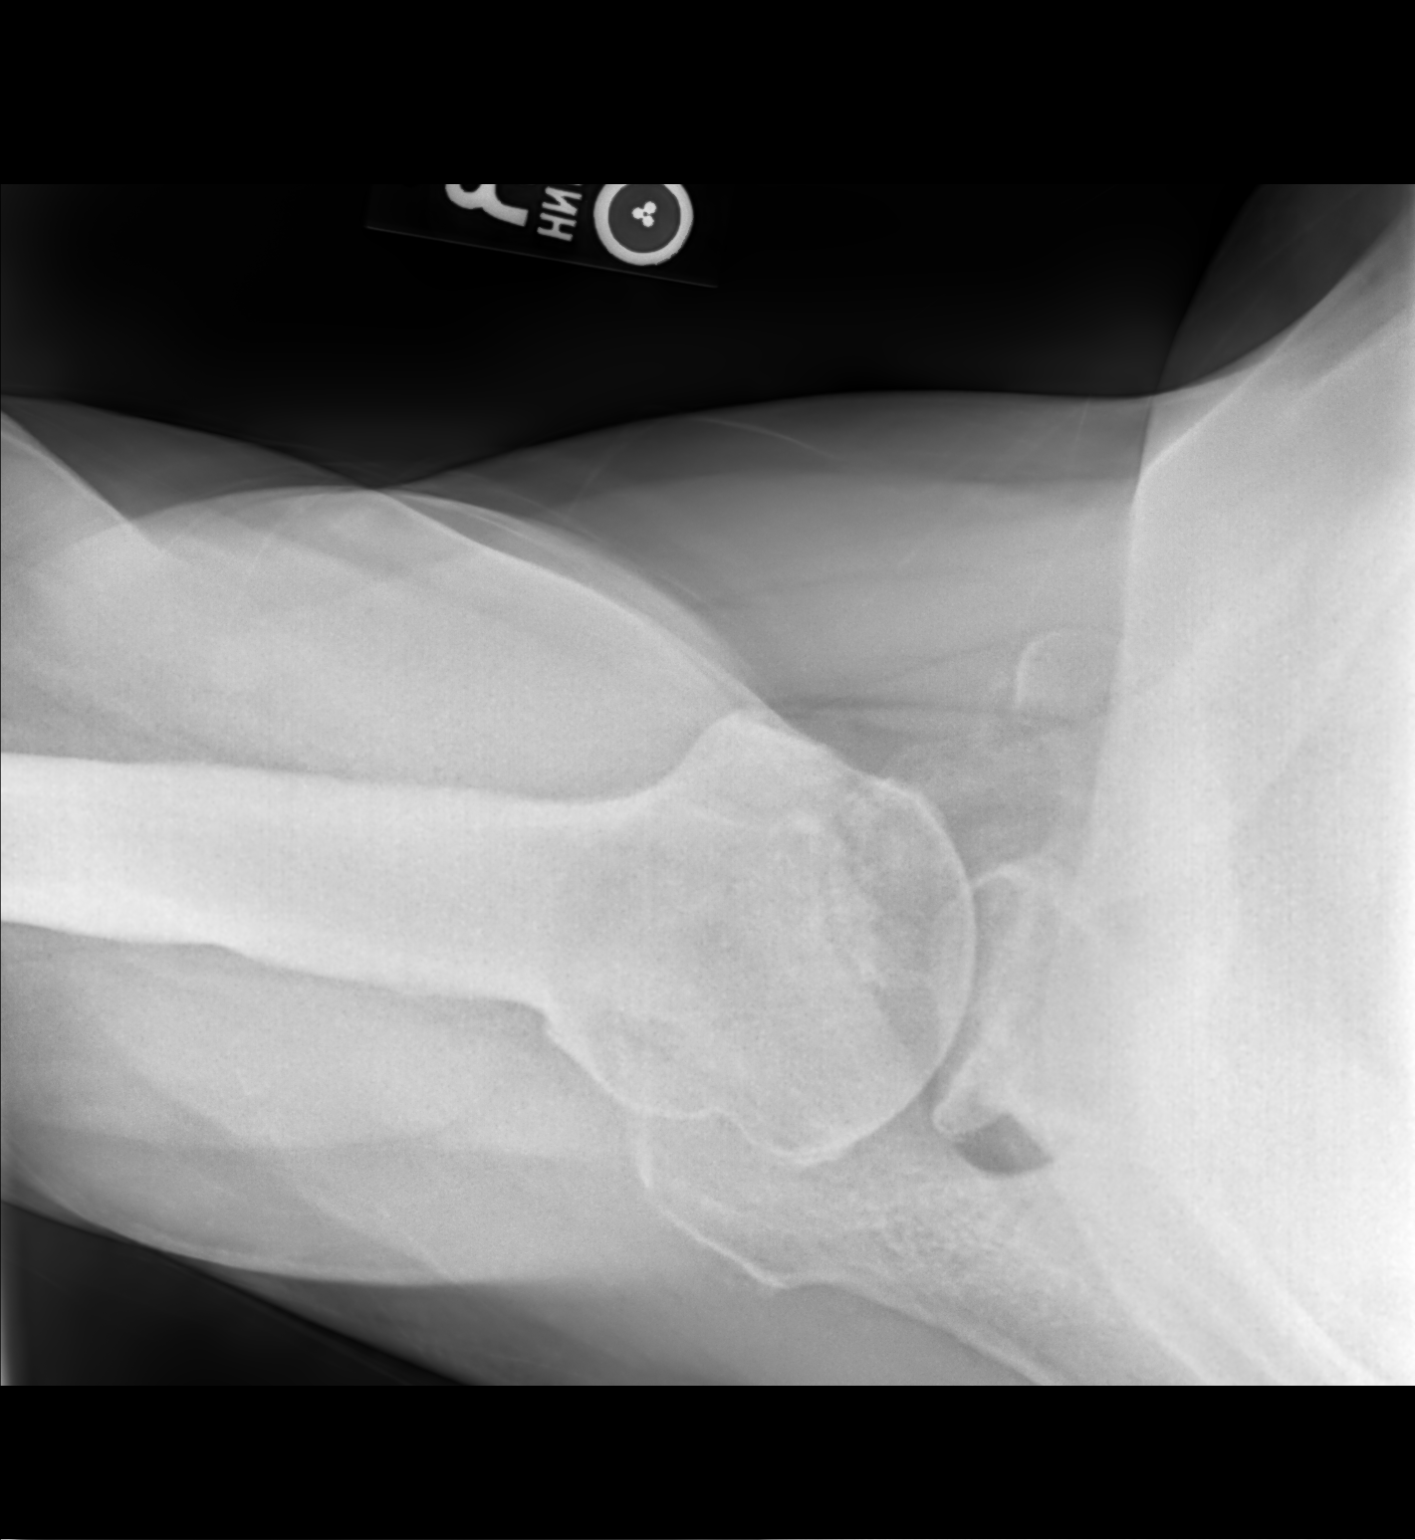

[3 of 3 positions shown; findings below may reference images not displayed]

FINDINGS: Severe acromioclavicular joint space narrowing and peripheral
osteophytosis. Moderate to severe glenohumeral joint space narrowing
and subchondral sclerosis. Mild-to-moderate inferior glenoid and
humeral head degenerative osteophytosis. No acute fracture or
dislocation. The visualized portion of the right lung is
unremarkable.
IMPRESSION: Severe acromioclavicular and moderate to severe glenohumeral
osteoarthritis.

## 2022-06-02 ENCOUNTER — Other Ambulatory Visit (INDEPENDENT_AMBULATORY_CARE_PROVIDER_SITE_OTHER): Payer: Medicare Other

## 2022-06-02 DIAGNOSIS — E1169 Type 2 diabetes mellitus with other specified complication: Secondary | ICD-10-CM | POA: Diagnosis not present

## 2022-06-02 DIAGNOSIS — E782 Mixed hyperlipidemia: Secondary | ICD-10-CM

## 2022-06-02 DIAGNOSIS — E669 Obesity, unspecified: Secondary | ICD-10-CM | POA: Diagnosis not present

## 2022-06-02 LAB — COMPREHENSIVE METABOLIC PANEL
ALT: 18 U/L (ref 0–53)
AST: 14 U/L (ref 0–37)
Albumin: 4.7 g/dL (ref 3.5–5.2)
Alkaline Phosphatase: 54 U/L (ref 39–117)
BUN: 18 mg/dL (ref 6–23)
CO2: 29 mEq/L (ref 19–32)
Calcium: 10.2 mg/dL (ref 8.4–10.5)
Chloride: 101 mEq/L (ref 96–112)
Creatinine, Ser: 1.05 mg/dL (ref 0.40–1.50)
GFR: 72.71 mL/min (ref >60.00)
Glucose, Bld: 121 mg/dL — ABNORMAL HIGH (ref 70–99)
Potassium: 4.8 mEq/L (ref 3.5–5.1)
Sodium: 139 mEq/L (ref 135–145)
Total Bilirubin: 0.5 mg/dL (ref 0.2–1.2)
Total Protein: 7.7 g/dL (ref 6.0–8.3)

## 2022-06-02 LAB — HEMOGLOBIN A1C: Hgb A1c MFr Bld: 6.4 % (ref 4.6–6.5)

## 2022-06-02 LAB — LIPID PANEL
Cholesterol: 187 mg/dL (ref 0–200)
HDL: 78.6 mg/dL (ref >39.00)
LDL Cholesterol: 86 mg/dL (ref 0–99)
NonHDL: 108.82
Total CHOL/HDL Ratio: 2
Triglycerides: 114 mg/dL (ref 0.0–149.0)
VLDL: 22.8 mg/dL (ref 0.0–40.0)

## 2022-06-03 ENCOUNTER — Other Ambulatory Visit: Payer: Self-pay | Admitting: Endocrinology

## 2022-06-03 DIAGNOSIS — E1169 Type 2 diabetes mellitus with other specified complication: Secondary | ICD-10-CM

## 2022-06-05 ENCOUNTER — Encounter: Payer: Self-pay | Admitting: Endocrinology

## 2022-06-05 ENCOUNTER — Ambulatory Visit (INDEPENDENT_AMBULATORY_CARE_PROVIDER_SITE_OTHER): Payer: Medicare Other | Admitting: Endocrinology

## 2022-06-05 VITALS — BP 156/90 | HR 73 | Ht 71.0 in | Wt 244.8 lb

## 2022-06-05 DIAGNOSIS — E1169 Type 2 diabetes mellitus with other specified complication: Secondary | ICD-10-CM | POA: Diagnosis not present

## 2022-06-05 DIAGNOSIS — E782 Mixed hyperlipidemia: Secondary | ICD-10-CM | POA: Diagnosis not present

## 2022-06-05 DIAGNOSIS — I10 Essential (primary) hypertension: Secondary | ICD-10-CM

## 2022-06-05 DIAGNOSIS — I6523 Occlusion and stenosis of bilateral carotid arteries: Secondary | ICD-10-CM

## 2022-06-05 DIAGNOSIS — E669 Obesity, unspecified: Secondary | ICD-10-CM | POA: Diagnosis not present

## 2022-06-05 MED ORDER — OZEMPIC (2 MG/DOSE) 8 MG/3ML ~~LOC~~ SOPN: PEN_INJECTOR | SUBCUTANEOUS | 2 refills | Status: DC

## 2022-06-05 NOTE — Progress Notes (Signed)
Patient ID: Evan [name], male   DOB: January 23, 1956, 65 y.o.   MRN: [id number]          Reason for Appointment:Type 2 Diabetes and related problems   History of Present Illness:          Date of diagnosis of type 2 diabetes mellitus: 2012        Background history:   Diagnosed incidentally with a high blood sugar Has been on metformin since diagnosis and subsequently Amaryl had been added He has had sporadic follow-up for the last few years His A1c had been below 7 to about 08/2015 Subsequently in 9/17 his A1c had gone up to 8%  Recent history:      Non-insulin hypoglycemic drugs : Farxiga 10 mg,  metformin 1.5 g at dinner, Ozempic 2  mg weekly  His A1c is 6.4 compared to 6.5  Current management, blood sugar patterns and problems identified: He has been able to get his Ozempic only intermittently since September and only the 1 mg dose instead of 2 mg  Although he is mostly trying to watch his diet as before with carbohydrate restriction he has gained about 9 pounds  Also may not be consistent with the diet in the last couple of weeks  Because of knee pain he has not done as much walking as before  Blood sugars at home are relatively higher in the morning, averaging about 30 mg higher than before However has minimal monitoring after meals in the evening  Currently taking 1 g of metformin at dinnertime and 500 mg at bedtime  Meals: Usually skipping breakfast, dinner is usually a protein with vegetables or salad, low-fat intake       Side effects from medications have been: None  Glucose monitoring:  done 1 or less times a day         Glucometer: One Touch Verio  .      Blood Glucose readings from download:   PRE-MEAL Fasting Lunch Dinner Bedtime Overall  Glucose range: 128-161   148 117-189  Mean/median: 145 126      POST-MEAL PC Breakfast PC Lunch PC Dinner  Glucose range:  124, 189   Mean/median:      Previously  PRE-MEAL Fasting Lunch Dinner Bedtime Overall  Glucose  range: 82-128   99-120 82-144  Mean/median: 115    115   POST-MEAL PC Breakfast PC Lunch PC Dinner  Glucose range:   106-144  Mean/median: 122  120     Self-care: The diet that the patient has been following is: tries to reduce high-fat and fried foods, Carbohydrates and avoiding drinks with sugar .       Dietician visit, most recent: 02/2017  Weight history: Highest previous weight 287 in 2019  Wt Readings from Last 3 Encounters:  06/05/22 244 lb 12.8 oz (111 kg)  02/12/22 235 lb 3.2 oz (106.7 kg)  02/03/22 232 lb 3.2 oz (105.3 kg)    Glycemic control:   Lab Results  Component Value Date   HGBA1C 6.4 06/02/2022   HGBA1C 6.5 12/23/2021   HGBA1C 6.1 10/29/2021   Lab Results  Component Value Date   MICROALBUR <0.7 07/29/2021   LDLCALC 86 06/02/2022   CREATININE 1.05 06/02/2022   Lab Results  Component Value Date   MICRALBCREAT 1.5 07/29/2021    Lab Results  Component Value Date   FRUCTOSAMINE 272 12/25/2020   FRUCTOSAMINE 224 12/19/2019   FRUCTOSAMINE 239 12/13/2017      Allergies as  of 06/05/2022       Reactions   Penicillins    Respiratory distress as infant        Medication List        Accurate as of June 05, 2022  9:19 AM. If you have any questions, ask your nurse or doctor.          amLODipine 5 MG tablet Commonly known as: NORVASC TAKE 1 TABLET (5 MG TOTAL) BY MOUTH DAILY.   ascorbic acid 500 MG tablet Commonly known as: VITAMIN C Take 500 mg by mouth daily.   aspirin EC 81 MG tablet Take 81 mg by mouth every evening. Swallow whole.   cephALEXin 500 MG capsule Commonly known as: KEFLEX TAKE 4 CAPSULES BY MOUTH 60 MINUTES PRIOR TO ANY DENTAL APPOINTMENTS   Farxiga 10 MG Tabs tablet Generic drug: dapagliflozin propanediol TAKE 1 TABLET (10 MG TOTAL) BY MOUTH EVERY MORNING.   fluticasone 50 MCG/ACT nasal spray Commonly known as: FLONASE Place 1 spray into both nostrils daily as needed for allergies or rhinitis.   K2 PLUS  D3 PO Take 1 tablet by mouth every evening.   losartan 100 MG tablet Commonly known as: COZAAR TAKE 1/2 TABLET BY MOUTH DAILY   Magnesium 250 MG Tabs Take 250 mg by mouth every evening.   metFORMIN 1000 MG tablet Commonly known as: GLUCOPHAGE Take 1 and 1/2 tablet in the evening.   OneTouch Verio Flex System w/Device Kit 1 kit by Does not apply route daily. Use to check blood sugars 2 times daily   OneTouch Verio test strip Generic drug: glucose blood USE TO CHECK BLOOD SUGARS 2 TIMES DAILY   Ozempic (2 MG/DOSE) 8 MG/3ML Sopn Generic drug: Semaglutide (2 MG/DOSE) INJECT 2 MG AS DIRECTED ONCE A WEEK.   Ozempic (1 MG/DOSE) 4 MG/3ML Sopn Generic drug: Semaglutide (1 MG/DOSE) INJECT 1 MG INTO THE SKIN ONCE  WEEK AS DIRECTED   rosuvastatin 5 MG tablet Commonly known as: CRESTOR TAKE 1 TABLET BY MOUTH EVERY DAY   sildenafil 20 MG tablet Commonly known as: REVATIO Take 5 tablets (100 mg total) by mouth daily as needed (relations).   tadalafil 10 MG tablet Commonly known as: CIALIS Take 1 tablet (10 mg total) by mouth every other day as needed for erectile dysfunction.   TURMERIC PO Take 1,800 mg by mouth every evening.   Vitamin B-12 2500 MCG Subl Take 2,500 mcg by mouth daily.   Zinc 50 MG Tabs Take 50 mg by mouth every evening.        Allergies:  Allergies  Allergen Reactions   Penicillins     Respiratory distress as infant    Past Medical History:  Diagnosis Date   Adrenal adenoma    Complication of anesthesia    difficulty to weak up   COVID-19 virus infection 05/2020   Depression    intolerant of cymbalta and wellbutrin   Diverticulosis of colon    Esophagitis 03/2015   by EGD   Gastritis 03/2015   by EGD - mild, chronic   Hyperlipidemia    Hypertension    Left ventricular hypertrophy    Lichen simplex chronicus 05/2015   R scrotum [name])   Liver lesion    2022 CTs: There was an incidental finding of a hypervascular lesion in the right  lobe of the liver as well as left adrenal nodule. He underwent MRI of the abdomen showing the appearance of the liver favored focal nodular hyperplasia, as well as small  left adrenal adenoma.   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 Angelena Form & Dr. Cyndia Bent    Severe aortic stenosis    T2DM (type 2 diabetes mellitus) ([place])    DSME 09/2013    Past Surgical History:  Procedure Laterality Date   COLONOSCOPY  2006   diverticulosis per pt   COLONOSCOPY  03/2015   2 polyps, rpt 5 yrs [name])   ESOPHAGOGASTRODUODENOSCOPY  03/2015   mild chronic gastritis, esophagitis    INTRAOPERATIVE TRANSTHORACIC ECHOCARDIOGRAM Left 10/29/2020   Procedure: INTRAOPERATIVE TRANSTHORACIC ECHOCARDIOGRAM;  Surgeon: Burnell Blanks, MD;  Location: [place];  Service: Open Heart Surgery;  Laterality: Left;   LUMBAR LAMINECTOMY  1989   L5-S1   RIGHT/LEFT HEART CATH AND CORONARY ANGIOGRAPHY N/A 08/15/2020   Procedure: RIGHT/LEFT HEART CATH AND CORONARY ANGIOGRAPHY;  Surgeon: Burnell Blanks, MD;  Location: [place];  Service: Cardiovascular;  Laterality: N/A;   TRANSCATHETER AORTIC VALVE REPLACEMENT, TRANSFEMORAL Bilateral 10/29/2020   Procedure: TRANSCATHETER AORTIC VALVE REPLACEMENT, TRANSFEMORAL;  Surgeon: Burnell Blanks, MD;  Location: [facility];  Service: Open Heart Surgery;  Laterality: Bilateral;   ULTRASOUND GUIDANCE FOR VASCULAR ACCESS Bilateral 10/29/2020   Procedure: ULTRASOUND GUIDANCE FOR VASCULAR ACCESS;  Surgeon: Burnell Blanks, MD;  Location: [place];  Service: Open Heart Surgery;  Laterality: Bilateral;    Family History  Problem Relation Age of Onset   Alzheimer's disease Father    Hypertension Father    Stroke Father    Hyperlipidemia Father    Prostate cancer  Brother    Diabetes Brother    Alcohol abuse Brother    CAD Brother    Coronary artery disease Maternal Uncle    Schizophrenia Paternal Uncle    Emphysema Mother    Alcohol abuse Mother    Stroke Maternal Grandmother    Stroke Maternal Grandfather    Diabetes Maternal Grandfather    Stroke Paternal Grandmother    Stroke Paternal Grandfather     Social History:  reports that he has never smoked. He has never used smokeless tobacco. He reports that he does not currently use alcohol. He reports that he does not use drugs.   Review of Systems  Lipid history: On Crestor 5 mg from his PCP and his LDL is again below 100 Previously high triglycerides were related to alcohol intake last month and he has stopped alcohol intake   Lab Results  Component Value Date   CHOL 187 06/02/2022   CHOL 189 12/23/2021   CHOL 186 12/25/2020   Lab Results  Component Value Date   HDL 78.60 06/02/2022   HDL 64.40 12/23/2021   HDL 91.40 12/25/2020   Lab Results  Component Value Date   LDLCALC 86 06/02/2022   LDLCALC 82 12/25/2020   LDLCALC 71 03/11/2020   Lab Results  Component Value Date   TRIG 114.0 06/02/2022   TRIG 263.0 (H) 12/23/2021   TRIG 59.0 12/25/2020   Lab Results  Component Value Date   CHOLHDL 2 06/02/2022   CHOLHDL 3 12/23/2021   CHOLHDL 2 12/25/2020   Lab Results  Component Value Date   LDLDIRECT 89.0 12/23/2021   LDLDIRECT 99.0 05/20/2015   LDLDIRECT 153.6 12/03/2009  Hypertension: On Losartan 100 mg, 1/2 tablet daily with good control  Has a home monitor and blood pressures are generally better at home: Recently about 130s/70s  BP Readings from Last 3 Encounters:  06/05/22 (!) 156/90  02/12/22 (!) 138/98  02/03/22 124/78    Most recent foot exam: 8/23  ADRENAL adenoma: He has a small left 1.8 cm adrenal adenoma since 2018 and his PCP checked aldosterone levels Also there is no change in size of the adenoma and likely to be insignificant  He  has had some hepatic steatosis on x-rays  Lab Results  Component Value Date   ALT 18 06/02/2022     Physical Examination:  BP (!) 156/90   Pulse 73   Ht _0  (1.803 m)   Wt 244 lb 12.8 oz (111 kg)   SpO2 98%   BMI 34.14 kg/m    ASSESSMENT:  Diabetes type 2, non-insulin-dependent  See history of present illness for detailed discussion of current diabetes management, blood sugar patterns and problems identified     A1c is 6.4  Currently on 3 drug regimen including Farxiga, metformin 1 g daily and Ozempic 1 mg weekly  His blood sugars are somewhat higher at home even though A1c is about the same This is likely from getting a regular supply of Ozempic Has gained back some weight This may be also related to not exercising as much and recently not doing as well on the diet His blood sugars appear to be higher than before in the mornings mostly However not checking it often evenings Overall still doing well with diet in the last 3 months   HYPERTENSION: Blood pressure is high in the office but generally better elsewhere and at home  Renal function normal with Wilder Glade, also on losartan  Hyperlipidemia: Well-controlled with 5 mg Crestor  PLAN:    He will go back to 2 mg on the Ozempic which he may be able to get at [facility] now Also add another metformin 500 mg at bedtime to help with morning sugars If his blood sugars are not improving or weight loss not adequate may switch to [facility] on the next visit Discussed the patient is a 24 [employer]  If his blood pressure starts to go up persistently then he can contact his PCP or cardiologist  Continue same dose of Crestor    Follow-up in 4 months     There are no Patient Instructions on file for this visit.   Elayne Snare 06/05/2022, 9:19 AM   Note: This office note was prepared with Dragon voice recognition system technology. Any transcriptional errors that result from this process are unintentional.

## 2022-06-05 NOTE — Patient Instructions (Signed)
Take 1g Metformin at bedtime

## 2022-06-22 DIAGNOSIS — M79641 Pain in right hand: Secondary | ICD-10-CM | POA: Diagnosis not present

## 2022-07-03 ENCOUNTER — Ambulatory Visit (INDEPENDENT_AMBULATORY_CARE_PROVIDER_SITE_OTHER): Payer: Medicare Other | Admitting: Family Medicine

## 2022-07-03 ENCOUNTER — Encounter: Payer: Self-pay | Admitting: Family Medicine

## 2022-07-03 VITALS — BP 136/78 | HR 79 | Temp 97.2°F | Ht 71.0 in | Wt 244.5 lb

## 2022-07-03 DIAGNOSIS — Z952 Presence of prosthetic heart valve: Secondary | ICD-10-CM | POA: Diagnosis not present

## 2022-07-03 DIAGNOSIS — E1169 Type 2 diabetes mellitus with other specified complication: Secondary | ICD-10-CM | POA: Diagnosis not present

## 2022-07-03 DIAGNOSIS — E669 Obesity, unspecified: Secondary | ICD-10-CM

## 2022-07-03 DIAGNOSIS — K769 Liver disease, unspecified: Secondary | ICD-10-CM

## 2022-07-03 NOTE — Patient Instructions (Addendum)
Call [facility] to schedule appointment [facility]) [phone] .  You are doing well today Return in 6 month for wellness visit.

## 2022-07-03 NOTE — Assessment & Plan Note (Signed)
Chronic, stable based on A1c. Appreciate endo care.

## 2022-07-03 NOTE — Assessment & Plan Note (Addendum)
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 Notes intermittent RUQ mild discomfort, unchanged over the years. To let us know if changing symptoms for updated MRI. LFTs remain normal.

## 2022-07-03 NOTE — Progress Notes (Signed)
Patient ID: Evan [name], male    DOB: 22-Nov-1955, 67 y.o.   MRN: [id number]  This visit was conducted in person.  BP 136/78   Pulse 79   Temp (!) 97.2 F (36.2 C) (Temporal)   Ht '5\' 11"'$  (1.803 m)   Wt 244 lb 8 oz (110.9 kg)   SpO2 97%   BMI 34.10 kg/m    CC: 6 mo f/u visit  Subjective:   HPI: Evan [name] is a 66 y.o. male presenting on 07/03/2022 for Medical Management of Chronic Issues (Here for 6 mo f/u.)   Severe aortic stenosis s/p TAVR 10/2020, bicuspid aortic valve followed by cardiology Margaretann Loveless). S/p catheterization 08/2020 without significant CAD.   Bilateral sensorineural hearing loss - told hearing aide candidate if desired. Dizziness not due to vertigo or meniere's.   Saw Dr Tamera Punt ortho last week for R thumb numbness.   Right upper quadrant abd pain - mild, intermittent over years not progressive. 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   COLONOSCOPY Date: 03/2015 2 polyps, rpt 5 yrs [name] @ [facility]).  Colon cancer screening last 2016 - sees [facility], states never contacted. # provided today.   DM - managed by endo Dwyane Dee) last seen 05/2022, on farxiga '10mg'$  daily, metformin '1000mg'$  1.5 tab nightly, and ozempic '2mg'$  weekly (for the past 2 weeks), tolerating well without constipation, occ nausea. Foot exam 01/2022. Eye exam 05/2022. 10 lb weight gain in the past 6 months. He tried carnivore diet without benefit, he is now on ketodiet. Exercise - 100 push ups 3x/wk, walks 6 miles 5x/wk.  Lab Results  Component Value Date   HGBA1C 6.4 06/02/2022    Diabetic Foot Exam - Simple   No data filed        Relevant past medical, surgical, family and social history reviewed and updated as indicated. Interim medical history since our last visit reviewed. Allergies and medications reviewed and updated. Outpatient Medications Prior to Visit  Medication Sig Dispense Refill   amLODipine (NORVASC) 5 MG tablet TAKE 1 TABLET (5 MG TOTAL) BY MOUTH DAILY. 90  tablet 3   aspirin EC 81 MG tablet Take 81 mg by mouth every evening. Swallow whole.     Blood Glucose Monitoring Suppl (ONETOUCH VERIO FLEX SYSTEM) w/Device KIT 1 kit by Does not apply route daily. Use to check blood sugars 2 times daily 1 kit 2   cephALEXin (KEFLEX) 500 MG capsule TAKE 4 CAPSULES BY MOUTH 60 MINUTES PRIOR TO ANY DENTAL APPOINTMENTS 12 capsule 2   Cyanocobalamin (VITAMIN B-12) 2500 MCG SUBL Take 2,500 mcg by mouth daily.     FARXIGA 10 MG TABS tablet TAKE 1 TABLET (10 MG TOTAL) BY MOUTH EVERY MORNING. 30 tablet 2   fluticasone (FLONASE) 50 MCG/ACT nasal spray Place 1 spray into both nostrils daily as needed for allergies or rhinitis.     glucose blood (ONETOUCH VERIO) test strip USE TO CHECK BLOOD SUGARS 2 TIMES DAILY 200 strip 3   losartan (COZAAR) 100 MG tablet TAKE 1/2 TABLET BY MOUTH DAILY 45 tablet 3   Magnesium 250 MG TABS Take 250 mg by mouth every evening.     metFORMIN (GLUCOPHAGE) 1000 MG tablet Take 1 and 1/2 tablet in the evening. 135 tablet 2   OZEMPIC, 1 MG/DOSE, 4 MG/3ML SOPN INJECT 1 MG INTO THE SKIN ONCE  WEEK AS DIRECTED 3 mL 0   rosuvastatin (CRESTOR) 5 MG tablet TAKE 1 TABLET BY MOUTH EVERY DAY  90 tablet 3   Semaglutide, 2 MG/DOSE, (OZEMPIC, 2 MG/DOSE,) 8 MG/3ML SOPN INJECT 2 MG AS DIRECTED ONCE A WEEK. 3 mL 2   sildenafil (REVATIO) 20 MG tablet Take 5 tablets (100 mg total) by mouth daily as needed (relations). 30 tablet 6   tadalafil (CIALIS) 10 MG tablet Take 1 tablet (10 mg total) by mouth every other day as needed for erectile dysfunction. 10 tablet 1   TURMERIC PO Take 1,800 mg by mouth every evening.     vitamin C (ASCORBIC ACID) 500 MG tablet Take 500 mg by mouth daily.     Vitamin D-Vitamin K (K2 PLUS D3 PO) Take 1 tablet by mouth every evening.     Zinc 50 MG TABS Take 50 mg by mouth every evening.     No facility-administered medications prior to visit.     Per HPI unless specifically indicated in ROS section below Review of  Systems  Objective:  BP 136/78   Pulse 79   Temp (!) 97.2 F (36.2 C) (Temporal)   Ht '5\' 11"'$  (1.803 m)   Wt 244 lb 8 oz (110.9 kg)   SpO2 97%   BMI 34.10 kg/m   Wt Readings from Last 3 Encounters:  07/03/22 244 lb 8 oz (110.9 kg)  06/05/22 244 lb 12.8 oz (111 kg)  02/12/22 235 lb 3.2 oz (106.7 kg)      Physical Exam Vitals and nursing note reviewed.  Constitutional:      Appearance: Normal appearance. He is not ill-appearing.  HENT:     Head: Normocephalic and atraumatic.     Mouth/Throat:     Mouth: Mucous membranes are moist.     Pharynx: Oropharynx is clear. No oropharyngeal exudate or posterior oropharyngeal erythema.  Eyes:     Extraocular Movements: Extraocular movements intact.     Pupils: Pupils are equal, round, and reactive to light.  Cardiovascular:     Rate and Rhythm: Normal rate and regular rhythm.     Pulses: Normal pulses.     Heart sounds: Murmur (3/6 USB) heard.  Pulmonary:     Effort: Pulmonary effort is normal. No respiratory distress.     Breath sounds: Normal breath sounds. No wheezing, rhonchi or rales.  Abdominal:     General: Bowel sounds are normal. There is no distension.     Palpations: Abdomen is soft. There is no mass.     Tenderness: There is no abdominal tenderness. There is no guarding or rebound.     Hernia: No hernia is present.  Musculoskeletal:     Right lower leg: No edema.     Left lower leg: No edema.  Skin:    General: Skin is warm and dry.     Findings: No rash.  Neurological:     Mental Status: He is alert.  Psychiatric:        Mood and Affect: Mood normal.        Behavior: Behavior normal.       Results for orders placed or performed in visit on 06/02/22  Lipid panel  Result Value Ref Range   Cholesterol 187 0 - 200 mg/dL   Triglycerides 114.0 0.0 - 149.0 mg/dL   HDL 78.60 >39.00 mg/dL   VLDL 22.8 0.0 - 40.0 mg/dL   LDL Cholesterol 86 0 - 99 mg/dL   Total CHOL/HDL Ratio 2    NonHDL 108.82   Comprehensive  metabolic panel  Result Value Ref Range   Sodium 139 135 -  145 mEq/L   Potassium 4.8 3.5 - 5.1 mEq/L   Chloride 101 96 - 112 mEq/L   CO2 29 19 - 32 mEq/L   Glucose, Bld 121 (H) 70 - 99 mg/dL   BUN 18 6 - 23 mg/dL   Creatinine, Ser 1.05 0.40 - 1.50 mg/dL   Total Bilirubin 0.5 0.2 - 1.2 mg/dL   Alkaline Phosphatase 54 39 - 117 U/L   AST 14 0 - 37 U/L   ALT 18 0 - 53 U/L   Total Protein 7.7 6.0 - 8.3 g/dL   Albumin 4.7 3.5 - 5.2 g/dL   GFR 72.71 >60.00 mL/min   Calcium 10.2 8.4 - 10.5 mg/dL  Hemoglobin A1c  Result Value Ref Range   Hgb A1c MFr Bld 6.4 4.6 - 6.5 %    Assessment & Plan:  # provided to call and schedule [facility] f/u as due for rpt colonoscopy.   Problem List Items Addressed This Visit     Type 2 diabetes mellitus with other specified complication (HCC)    Chronic, stable based on A1c. Appreciate endo care.       Obesity, Class I, BMI 30-34.9    Weight gain noted - discussed this as well as diet and lifestyle efforts.       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 Notes intermittent RUQ mild discomfort, unchanged over the years. To let us know if changing symptoms for updated MRI. LFTs remain normal.         No orders of the defined types were placed in this encounter.   No orders of the defined types were placed in this encounter.   Patient Instructions  Call [facility] to schedule appointment [facility]) [phone] .  You are doing well today Return in 6 month for wellness visit.   Follow up plan: Return in about 6 months (around 01/01/2023) for medicare wellness visit.  Ria Bush, MD

## 2022-07-03 NOTE — Assessment & Plan Note (Signed)
Weight gain noted - discussed this as well as diet and lifestyle efforts.

## 2022-08-23 ENCOUNTER — Other Ambulatory Visit: Payer: Self-pay | Admitting: Family Medicine

## 2022-08-24 NOTE — Telephone Encounter (Signed)
Refill request Sildenafil Last refill 07/01/21 #30/6 Last office visit 07/03/22

## 2022-08-27 ENCOUNTER — Other Ambulatory Visit: Payer: Self-pay | Admitting: Endocrinology

## 2022-08-27 DIAGNOSIS — E1169 Type 2 diabetes mellitus with other specified complication: Secondary | ICD-10-CM

## 2022-09-13 ENCOUNTER — Other Ambulatory Visit: Payer: Self-pay | Admitting: Family Medicine

## 2022-09-18 ENCOUNTER — Other Ambulatory Visit: Payer: Self-pay | Admitting: Endocrinology

## 2022-09-18 ENCOUNTER — Other Ambulatory Visit: Payer: Self-pay | Admitting: Family Medicine

## 2022-09-18 DIAGNOSIS — E669 Obesity, unspecified: Secondary | ICD-10-CM

## 2022-09-18 NOTE — Telephone Encounter (Signed)
Refill request Cialis Last refill 03/24/22 #10/1 Last office visit 07/03/22

## 2022-09-19 ENCOUNTER — Encounter: Payer: Self-pay | Admitting: Family Medicine

## 2022-09-19 ENCOUNTER — Encounter: Payer: Self-pay | Admitting: Endocrinology

## 2022-09-20 ENCOUNTER — Other Ambulatory Visit: Payer: Self-pay

## 2022-09-20 MED ORDER — OZEMPIC (2 MG/DOSE) 8 MG/3ML ~~LOC~~ SOPN: PEN_INJECTOR | SUBCUTANEOUS | 3 refills | Status: DC

## 2022-09-22 NOTE — Telephone Encounter (Signed)
ERx 

## 2022-09-29 ENCOUNTER — Other Ambulatory Visit (INDEPENDENT_AMBULATORY_CARE_PROVIDER_SITE_OTHER): Payer: Medicare Other

## 2022-09-29 DIAGNOSIS — E669 Obesity, unspecified: Secondary | ICD-10-CM | POA: Diagnosis not present

## 2022-09-29 DIAGNOSIS — E1169 Type 2 diabetes mellitus with other specified complication: Secondary | ICD-10-CM | POA: Diagnosis not present

## 2022-09-29 LAB — BASIC METABOLIC PANEL
BUN: 15 mg/dL (ref 6–23)
CO2: 27 mEq/L (ref 19–32)
Calcium: 10.1 mg/dL (ref 8.4–10.5)
Chloride: 97 mEq/L (ref 96–112)
Creatinine, Ser: 0.97 mg/dL (ref 0.40–1.50)
GFR: 79.78 mL/min (ref >60.00)
Glucose, Bld: 116 mg/dL — ABNORMAL HIGH (ref 70–99)
Potassium: 4.8 mEq/L (ref 3.5–5.1)
Sodium: 135 mEq/L (ref 135–145)

## 2022-09-29 LAB — MICROALBUMIN / CREATININE URINE RATIO
Creatinine,U: 110.5 mg/dL
Microalb Creat Ratio: 1.2 mg/g (ref 0.0–30.0)
Microalb, Ur: 1.4 mg/dL (ref 0.0–1.9)

## 2022-09-29 LAB — HEMOGLOBIN A1C: Hgb A1c MFr Bld: 6.3 % (ref 4.6–6.5)

## 2022-10-02 ENCOUNTER — Other Ambulatory Visit: Payer: Self-pay

## 2022-10-02 ENCOUNTER — Encounter: Payer: Self-pay | Admitting: Endocrinology

## 2022-10-02 ENCOUNTER — Ambulatory Visit (INDEPENDENT_AMBULATORY_CARE_PROVIDER_SITE_OTHER): Payer: Medicare Other | Admitting: Endocrinology

## 2022-10-02 VITALS — BP 122/80 | HR 72 | Ht 71.0 in | Wt 242.0 lb

## 2022-10-02 DIAGNOSIS — E1169 Type 2 diabetes mellitus with other specified complication: Secondary | ICD-10-CM | POA: Diagnosis not present

## 2022-10-02 DIAGNOSIS — E669 Obesity, unspecified: Secondary | ICD-10-CM | POA: Diagnosis not present

## 2022-10-02 MED ORDER — ACCU-CHEK GUIDE VI STRP: ORAL_STRIP | 12 refills | Status: AC

## 2022-10-02 MED ORDER — ACCU-CHEK MULTICLIX LANCETS MISC: 12 refills | Status: AC

## 2022-10-02 NOTE — Patient Instructions (Signed)
Check blood sugars on waking up days a week  Also check blood sugars about 2 hours after meals and do this after different meals by rotation  Recommended blood sugar levels on waking up are 90-130 and about 2 hours after meal is 130-160  Please bring your blood sugar monitor to each visit, thank you   

## 2022-10-02 NOTE — Progress Notes (Signed)
Patient ID: Evan [name], male   DOB: 03-30-1956, 67 y.o.   MRN: [id number]          Reason for Appointment:Type 2 Diabetes and related problems   History of Present Illness:          Date of diagnosis of type 2 diabetes mellitus: 2012        Background history:   Diagnosed incidentally with a high blood sugar Has been on metformin since diagnosis and subsequently Amaryl had been added He has had sporadic follow-up for the last few years His A1c had been below 7 to about 08/2015 Subsequently in 9/17 his A1c had gone up to 8%  Recent history:      Non-insulin hypoglycemic drugs : Farxiga 10 mg,  metformin 1 g at dinner , Ozempic 2  mg weekly  His A1c is 6.3  Current management, blood sugar patterns and problems identified: He has been able to get his Ozempic more regularly in the last 3 months and has not been able to stay consistently on 2 mg Although his blood sugars are still consistently well-controlled he is concerned about lack of weight loss He has not increased his carbohydrate intakes which are relatively low Also now has been able to walk 5 days a week, about 10,000 steps Blood sugars are overall excellent at home averaging 125 Now taking 1 g of metformin at dinnertime and 1000 mg at bedtime With this likely his fasting blood sugars are slightly better than before but still generally the highest of the day   Meals: Usually skipping breakfast, dinner is usually a protein with vegetables or salad, low-fat intake       Side effects from medications have been: None  Glucose monitoring:  done 1 or less times a day         Glucometer: One Touch Verio  .      Blood Glucose readings from download:   PRE-MEAL Fasting Lunch Dinner Bedtime Overall  Glucose range: 111-169      Mean/median: 134 121 108  125   POST-MEAL PC Breakfast PC Lunch PC Dinner  Glucose range:   112, 123  Mean/median:      Prior    PRE-MEAL Fasting Lunch Dinner Bedtime Overall  Glucose range:  128-161   148 117-189  Mean/median: 145 126      POST-MEAL PC Breakfast PC Lunch PC Dinner  Glucose range:  124, 189   Mean/median:       Self-care: The diet that the patient has been following is: tries to reduce high-fat and fried foods, Carbohydrates and avoiding drinks with sugar .       Dietician visit, most recent: 02/2017  Weight history: Highest previous weight 287 in 2019  Wt Readings from Last 3 Encounters:  10/02/22 242 lb (109.8 kg)  07/03/22 244 lb 8 oz (110.9 kg)  06/05/22 244 lb 12.8 oz (111 kg)    Glycemic control:   Lab Results  Component Value Date   HGBA1C 6.3 09/29/2022   HGBA1C 6.4 06/02/2022   HGBA1C 6.5 12/23/2021   Lab Results  Component Value Date   MICROALBUR 1.4 09/29/2022   LDLCALC 86 06/02/2022   CREATININE 0.97 09/29/2022   Lab Results  Component Value Date   MICRALBCREAT 1.2 09/29/2022    Lab Results  Component Value Date   FRUCTOSAMINE 272 12/25/2020   FRUCTOSAMINE 224 12/19/2019   FRUCTOSAMINE 239 12/13/2017      Allergies as of 10/02/2022  Reactions   Penicillins    Respiratory distress as infant        Medication List        Accurate as of October 02, 2022 11:59 PM. If you have any questions, ask your nurse or doctor.          Accu-Chek Guide test strip Generic drug: glucose blood Check blood sugar 2 times daily What changed: additional instructions Changed by: Cayarel L Hendricks, CMA   accu-chek multiclix lancets Check blood sugar 2 times daily Started by: Cayarel L Hendricks, CMA   amLODipine 5 MG tablet Commonly known as: NORVASC TAKE 1 TABLET (5 MG TOTAL) BY MOUTH DAILY.   ascorbic acid 500 MG tablet Commonly known as: VITAMIN C Take 500 mg by mouth daily.   aspirin EC 81 MG tablet Take 81 mg by mouth every evening. Swallow whole.   cephALEXin 500 MG capsule Commonly known as: KEFLEX TAKE 4 CAPSULES BY MOUTH 60 MINUTES PRIOR TO ANY DENTAL APPOINTMENTS   Farxiga 10 MG Tabs  tablet Generic drug: dapagliflozin propanediol TAKE 1 TABLET (10 MG TOTAL) BY MOUTH EVERY MORNING.   fluticasone 50 MCG/ACT nasal spray Commonly known as: FLONASE Place 1 spray into both nostrils daily as needed for allergies or rhinitis.   K2 PLUS D3 PO Take 1 tablet by mouth every evening.   losartan 100 MG tablet Commonly known as: COZAAR TAKE 1/2 TABLET BY MOUTH DAILY   Magnesium 250 MG Tabs Take 250 mg by mouth every evening.   metFORMIN 1000 MG tablet Commonly known as: GLUCOPHAGE Take 1 and 1/2 tablet in the evening. What changed: additional instructions   OneTouch Verio Flex System w/Device Kit 1 kit by Does not apply route daily. Use to check blood sugars 2 times daily   Ozempic (1 MG/DOSE) 4 MG/3ML Sopn Generic drug: Semaglutide (1 MG/DOSE) INJECT 1 MG INTO THE SKIN ONCE  WEEK AS DIRECTED   Ozempic (2 MG/DOSE) 8 MG/3ML Sopn Generic drug: Semaglutide (2 MG/DOSE) INJECT 2 MG INTO THE SKIN ONCE A WEEK   Ozempic (2 MG/DOSE) 8 MG/3ML Sopn Generic drug: Semaglutide (2 MG/DOSE) Inject 2mg  weekly   rosuvastatin 5 MG tablet Commonly known as: CRESTOR TAKE 1 TABLET BY MOUTH EVERY DAY   sildenafil 20 MG tablet Commonly known as: REVATIO TAKE 5 TABLETS BY MOUTH ONCE DAILY AS NEEDED   tadalafil 5 MG tablet Commonly known as: CIALIS Take 1 tablet by mouth once daily   TURMERIC PO Take 1,800 mg by mouth every evening.   Vitamin B-12 2500 MCG Subl Take 2,500 mcg by mouth daily.   Zinc 50 MG Tabs Take 50 mg by mouth every evening.        Allergies:  Allergies  Allergen Reactions   Penicillins     Respiratory distress as infant    Past Medical History:  Diagnosis Date   Adrenal adenoma    Complication of anesthesia    difficulty to weak up   COVID-19 virus infection 05/2020   Depression    intolerant of cymbalta and wellbutrin   Diverticulosis of colon    Esophagitis 03/2015   by EGD   Gastritis 03/2015   by EGD - mild, chronic    Hyperlipidemia    Hypertension    Left ventricular hypertrophy    Lichen simplex chronicus 05/2015   R scrotum Terri Piedra)   Liver lesion    2022 CTs: There was an incidental finding of a hypervascular lesion in the right lobe of the liver as  well as left adrenal nodule. He underwent MRI of the abdomen showing the appearance of the liver favored focal nodular hyperplasia, as well as small left adrenal adenoma.   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 Clifton James & Dr. Laneta Simmers    Severe aortic stenosis    T2DM (type 2 diabetes mellitus)    DSME 09/2013    Past Surgical History:  Procedure Laterality Date   COLONOSCOPY  2006   diverticulosis per pt   COLONOSCOPY  03/2015   2 polyps, rpt 5 yrs Madilyn Fireman)   ESOPHAGOGASTRODUODENOSCOPY  03/2015   mild chronic gastritis, esophagitis    INTRAOPERATIVE TRANSTHORACIC ECHOCARDIOGRAM Left 10/29/2020   Procedure: INTRAOPERATIVE TRANSTHORACIC ECHOCARDIOGRAM;  Surgeon: Kathleene Hazel, MD;  Location: [facility] OR;  Service: Open Heart Surgery;  Laterality: Left;   LUMBAR LAMINECTOMY  1989   L5-S1   RIGHT/LEFT HEART CATH AND CORONARY ANGIOGRAPHY N/A 08/15/2020   Procedure: RIGHT/LEFT HEART CATH AND CORONARY ANGIOGRAPHY;  Surgeon: Kathleene Hazel, MD;  Location: [facility] INVASIVE CV LAB;  Service: Cardiovascular;  Laterality: N/A;   TRANSCATHETER AORTIC VALVE REPLACEMENT, TRANSFEMORAL Bilateral 10/29/2020   Procedure: TRANSCATHETER AORTIC VALVE REPLACEMENT, TRANSFEMORAL;  Surgeon: Kathleene Hazel, MD;  Location: [facility] OR;  Service: Open Heart Surgery;  Laterality: Bilateral;   ULTRASOUND GUIDANCE FOR VASCULAR ACCESS Bilateral 10/29/2020   Procedure: ULTRASOUND GUIDANCE FOR VASCULAR ACCESS;  Surgeon: Kathleene Hazel, MD;  Location: [facility] OR;  Service:  Open Heart Surgery;  Laterality: Bilateral;    Family History  Problem Relation Age of Onset   Alzheimer's disease Father    Hypertension Father    Stroke Father    Hyperlipidemia Father    Prostate cancer Brother    Diabetes Brother    Alcohol abuse Brother    CAD Brother    Coronary artery disease Maternal Uncle    Schizophrenia Paternal Uncle    Emphysema Mother    Alcohol abuse Mother    Stroke Maternal Grandmother    Stroke Maternal Grandfather    Diabetes Maternal Grandfather    Stroke Paternal Grandmother    Stroke Paternal Grandfather     Social History:  reports that he has never smoked. He has never used smokeless tobacco. He reports that he does not currently use alcohol. He reports that he does not use drugs.   Review of Systems  Lipid history: On Crestor 5 mg from his PCP and his LDL is again below 100 Previously high triglycerides were related to alcohol intake last month and he has stopped alcohol intake   Lab Results  Component Value Date   CHOL 187 06/02/2022   CHOL 189 12/23/2021   CHOL 186 12/25/2020   Lab Results  Component Value Date   HDL 78.60 06/02/2022   HDL 64.40 12/23/2021   HDL 91.40 12/25/2020   Lab Results  Component Value Date   LDLCALC 86 06/02/2022   LDLCALC 82 12/25/2020   LDLCALC 71 03/11/2020   Lab Results  Component Value Date   TRIG 114.0 06/02/2022   TRIG 263.0 (H) 12/23/2021   TRIG 59.0 12/25/2020   Lab Results  Component Value Date   CHOLHDL 2 06/02/2022   CHOLHDL 3 12/23/2021   CHOLHDL 2 12/25/2020   Lab Results  Component Value Date  LDLDIRECT 89.0 12/23/2021   LDLDIRECT 99.0 05/20/2015   LDLDIRECT 153.6 12/03/2009            Hypertension: On Losartan 100 mg, 1/2 tablet daily with good control  Has a home monitor and blood pressures are old at home  BP Readings from Last 3 Encounters:  10/02/22 122/80  07/03/22 136/78  06/05/22 (!) 156/90    Most recent foot exam: 8/23  ADRENAL adenoma: He  has a small left 1.8 cm adrenal adenoma since 2018 and his PCP checked aldosterone levels There was no change in size of the adenoma and likely to be insignificant  He has had some hepatic steatosis on previous radiological studies  Lab Results  Component Value Date   ALT 18 06/02/2022     Physical Examination:  BP 122/80 (BP Location: Left Arm, Patient Position: Sitting, Cuff Size: Large)   Pulse 72   Ht 5\' 11"  (1.803 m)   Wt 242 lb (109.8 kg)   SpO2 97%   BMI 33.75 kg/m    ASSESSMENT:  Diabetes type 2, non-insulin-dependent  See history of present illness for detailed discussion of current diabetes management, blood sugar patterns and problems identified     A1c is 6.4  Currently on 3 drug regimen including Farxiga, metformin 1 g daily and Ozempic 2 mg weekly  His blood sugars are somewhat better in the morning with increasing metformin Again A1c is excellent and blood sugars are controlled including after meals although most of his monitoring is in the morning still Has done fairly well with his diet and exercise regimen Although he has lost about 45 pounds over the last few years with both Ozempic and lifestyle changes he appears to have reached a plateau   HYPERTENSION: Blood pressure is improved compared to his last visit  Microalbumin normal   PLAN:    No change in current regimen May also consider Mounjaro but not clear if this would enable further weight loss, he thinks that he has significant restrictions of carbohydrate calories already  Follow-up in 4 months     Patient Instructions  Check blood sugars on waking up days a week  Also check blood sugars about 2 hours after meals and do this after different meals by rotation  Recommended blood sugar levels on waking up are 90-130 and about 2 hours after meal is 130-160  Please bring your blood sugar monitor to each visit, thank you    Reather Littler 10/03/2022, 10:24 AM   Note: This office note  was prepared with Dragon voice recognition system technology. Any transcriptional errors that result from this process are unintentional.

## 2022-10-05 ENCOUNTER — Telehealth: Payer: Self-pay

## 2022-10-05 NOTE — Progress Notes (Signed)
Care Management & Coordination Services Pharmacy Team  Reason for Encounter: Appointment Reminder  Contacted patient to confirm telephone appointment with Al Corpus, PharmD on 10/07/2022 at 3:00.  Spoke with patient on 10/05/2022   Do you have any problems getting your medications? No  What is your top health concern you would like to discuss at your upcoming visit? None at this time  Have you seen any other providers since your last visit with PCP? Yes  Star Rating Drugs:  Medication:  Last Fill: Day Supply Farxiga 10 mg  09/26/22 30  Losartan 100 mg 07/18/22 90 Metformin 1000 mg 08/06/22 90 Ozempic 2 mg  09/22/22 28 Rosuvastatin 5 mg 07/18/22 90  Care Gaps: Annual wellness visit in last year? Yes 12/30/21  If Diabetic: Last eye exam / retinopathy screening: Up to date Last diabetic foot exam: Up to date  Al Corpus, PharmD notified  Claudina Lick, [place] Clinical Pharmacy Assistant [phone]

## 2022-10-07 ENCOUNTER — Ambulatory Visit: Payer: Medicare Other | Admitting: Pharmacist

## 2022-10-07 ENCOUNTER — Telehealth: Payer: Medicare Other

## 2022-10-07 NOTE — Progress Notes (Signed)
Care Management & Coordination Services Pharmacy Note  10/07/2022 Name:  Evan [name] MRN:  [id number] DOB:  January 19, 1956  Summary: F/U visit -DM: A1c 6.3% (09/2022) at goal; pt denies further issues with Ozempic supply -HTN: BP at goal in office -HLD: LDL 86 (05/2022) reasonable given lack of ASCVD  Recommendations/Changes made from today's visit: -No med changes  Follow up plan: -Pharmacist follow up PRN -PCP appt 01/04/23; Endocrine appt 02/02/23   Subjective: Evan [name] is an 67 y.o. year old male who is a primary patient of Eustaquio Boyden, MD.  The care coordination team was consulted for assistance with disease management and care coordination needs.    Engaged with patient by telephone for follow up visit.  Recent office visits: 07/03/22 Dr Sharen Hones OV: liver lesion (MRI 09/2020) - [facility] to schedule [facility] appt.   Recent consult visits: 10/02/22 Dr Lucianne Muss (Endo): f/u DM - A1c 6.3. Lost 45 lbs but has plateaued. No changes  06/05/22 Dr Lucianne Muss (Endo): resume Ozempic 2 mg. Take 2 metformin at night.   Hospital visits: None in previous 6 months   Objective:  Lab Results  Component Value Date   CREATININE 0.97 09/29/2022   BUN 15 09/29/2022   GFR 79.78 09/29/2022   EGFR 87 05/28/2021   GFRNONAA >60 10/30/2020   GFRAA 105 08/02/2020   NA 135 09/29/2022   K 4.8 09/29/2022   CALCIUM 10.1 09/29/2022   CO2 27 09/29/2022   GLUCOSE 116 (H) 09/29/2022    Lab Results  Component Value Date/Time   HGBA1C 6.3 09/29/2022 09:03 AM   HGBA1C 6.4 06/02/2022 10:17 AM   FRUCTOSAMINE 272 12/25/2020 11:10 AM   FRUCTOSAMINE 224 12/19/2019 08:14 AM   GFR 79.78 09/29/2022 09:03 AM   GFR 72.71 06/02/2022 10:17 AM   MICROALBUR 1.4 09/29/2022 09:03 AM   MICROALBUR <0.7 07/29/2021 03:40 PM    Last diabetic Eye exam:  Lab Results  Component Value Date/Time   HMDIABEYEEXA No Retinopathy 05/27/2022 12:00 AM    Last diabetic Foot exam: No results found for: "HMDIABFOOTEX"    Lab Results  Component Value Date   CHOL 187 06/02/2022   HDL 78.60 06/02/2022   LDLCALC 86 06/02/2022   LDLDIRECT 89.0 12/23/2021   TRIG 114.0 06/02/2022   CHOLHDL 2 06/02/2022       Latest Ref Rng & Units 06/02/2022   10:17 AM 12/23/2021    9:07 AM 07/30/2021   11:57 AM  Hepatic Function  Total Protein 6.0 - 8.3 g/dL 7.7  6.9  7.3   Albumin 3.5 - 5.2 g/dL 4.7  4.6  4.6   AST 0 - 37 U/L ALT 0 - 53 U/L Alk Phosphatase 39 - 117 U/L 54  43  47   Total Bilirubin 0.2 - 1.2 mg/dL 0.5  0.8  0.5     Lab Results  Component Value Date/Time   TSH 2.75 02/18/2016 12:54 PM   TSH 2.72 02/27/2013 09:20 AM       Latest Ref Rng & Units 05/28/2021   10:10 AM 10/30/2020    2:13 AM 10/29/2020    5:28 PM  CBC  WBC 3.4 - 10.8 x10E3/uL 5.1  9.0    Hemoglobin 13.0 - 17.7 g/dL 62.1  30.8  65.7   Hematocrit 37.5 - 51.0 % 45.4  40.0  42.0   Platelets 150 - 450 x10E3/uL 241  221  Lab Results  Component Value Date/Time   VD25OH 26.51 (L) 02/18/2016 12:54 PM   VITAMINB12 399 12/15/2016 03:22 PM   VITAMINB12 315 02/18/2016 12:54 PM    Clinical ASCVD: No  The 10-year ASCVD risk score (Arnett DK, et al., 2019) is: 25.6%   Values used to calculate the score:     Age: 67 years     Sex: Male     Is Non-Hispanic African American: No     Diabetic: Yes     Tobacco smoker: No     Systolic Blood Pressure: 122 mmHg     Is BP treated: Yes     HDL Cholesterol: 78.6 mg/dL     Total Cholesterol: 187 mg/dL        02/27/7828   56:21 AM 12/30/2021    9:42 AM 12/25/2020    1:32 PM  Depression screen PHQ 2/9  Decreased Interest 0 1 0  Down, Depressed, Hopeless 0 1 0  PHQ - 2 Score 0 2 0  Altered sleeping 1 3 0  Tired, decreased energy 0 2 0  Change in appetite 1 0 0  Feeling bad or failure about yourself  1 0 0  Trouble concentrating 0 0 0  Moving slowly or fidgety/restless 0 0 0  Suicidal thoughts  0 0  PHQ-9 Score 3 7 0  Difficult doing work/chores Not  difficult at all Somewhat difficult Not difficult at all     Social History   Tobacco Use  Smoking Status Never  Smokeless Tobacco Never   BP Readings from Last 3 Encounters:  10/02/22 122/80  07/03/22 136/78  06/05/22 (!) 156/90   Pulse Readings from Last 3 Encounters:  10/02/22 72  07/03/22 79  06/05/22 73   Wt Readings from Last 3 Encounters:  10/02/22 242 lb (109.8 kg)  07/03/22 244 lb 8 oz (110.9 kg)  06/05/22 244 lb 12.8 oz (111 kg)   BMI Readings from Last 3 Encounters:  10/02/22 33.75 kg/m  07/03/22 34.10 kg/m  06/05/22 34.14 kg/m    Allergies  Allergen Reactions   Penicillins     Respiratory distress as infant    Medications Reviewed Today     Reviewed by Kathyrn Sheriff, [facility] (Pharmacist) on 10/07/22 at 1513  Med List Status: <None>   Medication Order Taking? Sig Documenting Provider Last Dose Status Informant  amLODipine (NORVASC) 5 MG tablet [id number] Yes TAKE 1 TABLET (5 MG TOTAL) BY MOUTH DAILY. Janetta Hora, PA-C Taking Active   aspirin EC 81 MG tablet [id number] Yes Take 81 mg by mouth every evening. Swallow whole. [provider] Taking Active Self  Blood Glucose Monitoring Suppl (ONETOUCH VERIO FLEX SYSTEM) w/Device KIT [id number] Yes 1 kit by Does not apply route daily. Use to check blood sugars 2 times daily Reather Littler, MD Taking Active Self  cephALEXin (KEFLEX) 500 MG capsule [id number] Yes TAKE 4 CAPSULES BY MOUTH 60 MINUTES PRIOR TO ANY DENTAL APPOINTMENTS Kathleene Hazel, MD Taking Active   Cyanocobalamin (VITAMIN B-12) 2500 MCG SUBL [id number] Yes Take 2,500 mcg by mouth daily. [provider] Taking Active Self  FARXIGA 10 MG TABS tablet [id number] Yes TAKE 1 TABLET (10 MG TOTAL) BY MOUTH EVERY MORNING. Reather Littler, MD Taking Active   fluticasone [facility]) 50 MCG/ACT nasal spray [id number] Yes Place 1 spray into both nostrils daily as needed for allergies or rhinitis. [provider] Taking Active  Self  glucose blood (ACCU-CHEK GUIDE) test strip [id number] Yes Check blood sugar 2  times daily Reather Littler, MD Taking Active   Lancets [facility] MULTICLIX) lancets [id number] Yes Check blood sugar 2 times daily Reather Littler, MD Taking Active   losartan (COZAAR) 100 MG tablet [id number] Yes TAKE 1/2 TABLET BY MOUTH DAILY Eustaquio Boyden, MD Taking Active   Magnesium 250 MG TABS [id number] Yes Take 250 mg by mouth every evening. [provider] Taking Active Self  metFORMIN (GLUCOPHAGE) 1000 MG tablet [id number] Yes Take 1 and 1/2 tablet in the evening.  Patient taking differently: Take 2 tablet daily   Reather Littler, MD Taking Active   rosuvastatin (CRESTOR) 5 MG tablet [id number] Yes TAKE 1 TABLET BY MOUTH EVERY DAY Eustaquio Boyden, MD Taking Active   Semaglutide, 2 MG/DOSE, (OZEMPIC, 2 MG/DOSE,) 8 MG/3ML SOPN [id number] Yes Inject  weekly Reather Littler, MD Taking Active   sildenafil (REVATIO) 20 MG tablet [id number] Yes TAKE 5 TABLETS BY MOUTH ONCE DAILY AS NEEDED Eustaquio Boyden, MD Taking Active   tadalafil (CIALIS) 5 MG tablet [id number] Yes Take 1 tablet by mouth once daily Eustaquio Boyden, MD Taking Active   TURMERIC PO [id number] Yes Take 1,800 mg by mouth every evening. [provider] Taking Active Self  vitamin C (ASCORBIC ACID) 500 MG tablet [id number] Yes Take 500 mg by mouth daily. [provider] Taking Active   Vitamin D-Vitamin K (K2 PLUS D3 PO) [id number] Yes Take 1 tablet by mouth every evening. [provider] Taking Active Self  Zinc 50 MG TABS [id number] Yes Take 50 mg by mouth every evening. [provider] Taking Active Self            SDOH:  (Social Determinants of Health) assessments and interventions performed: No SDOH Interventions    Flowsheet Row Chronic Care Management from 07/14/2021 in [facility] Clinical Support from 12/25/2020 in [facility] [place]  SDOH  Interventions    Food Insecurity Interventions Intervention Not Indicated --  Depression Interventions/Treatment  -- SEG3-1 Score <4 Follow-up Not Indicated  Financial Strain Interventions Intervention Not Indicated --       Medication Assistance: None required.  Patient affirms current coverage meets needs.  Medication Access: Within the past 30 days, how often has patient missed a dose of medication? 0 Is a pillbox or other method used to improve adherence? No  Factors that may affect medication adherence? no barriers identified Are meds synced by current pharmacy? No  Are meds delivered by current pharmacy? No  Does patient experience delays in picking up medications due to transportation concerns? No   [facility] Services Reviewed: Is patient disadvantaged to use [facility]?: Yes  Current Rx insurance plan: FEP Name and location of Current pharmacy:  [id number] - [place], [place] - [address] [place] [address] Phone: [phone] Fax: [phone]  [id number] - [place]),  - [address] [place] ([place]) [place] [address] Phone: [phone] Fax: [phone]  [facility] services reviewed with patient today?: No  Patient requests to transfer care to [facility]?: No  Reason patient declined to change pharmacies: Disadvantaged due to insurance/mail order  Compliance/Adherence/Medication fill history: Care Gaps: FOBT (due 12/2021)  Star-Rating Drugs: Farxiga - PDC 53%; LF 09/26/22, 08/06/22, 05/12/22 Losartan - PDC 100% Metformin - PDC 100% Ozempic - PDC 2% (inaccurate); LF 09/22/22, 08/27/22, 08/03/22, 07/07/22, 06/07/23 Rosuvastatin - PDC 100%   Assessment/Plan  Hypertension (BP goal <140/90) -Controlled - BP is at goal in office -  s/p TAVR 10/2020 -Current treatment: Amlodipine 5 mg daily - Appropriate, Effective, Safe, Accessible Losartan 100 mg daily -Appropriate, Effective,  Safe, Accessible -Medications previously tried: metoprolol (bradycardia)  -Current home readings: 120s/76 -Denies hypotensive/hypertensive symptoms -Educated on BP goals and benefits of medications for prevention of heart attack, stroke and kidney damage; -Counseled to monitor BP at home periodically -Recommended to continue current medication  Hyperlipidemia: (LDL goal < 100) -Controlled - LDL 86 (05/2022); pt endorses compliance with statin -Hx cath 08/2020 - no evidence of CAD.  -Current treatment: Rosuvastatin 5 mg daily HS -Appropriate, Effective, Safe, Accessible Aspirin 81 mg daily -Appropriate, Effective, Safe, Accessible -Educated on Cholesterol goals; Benefits of statin for ASCVD risk reduction; -Recommended to continue current medication  Diabetes (A1c goal <7%) -Controlled - A1c 6.3 (09/2022) at goal; pt endorses compliance with regimen -Current medications: Metformin 1000 mg BID - Appropriate, Effective, Safe, Accessible Farxiga 10 mg daily - Appropriate, Effective, Safe, Accessible Ozempic 2 mg weekly -Appropriate, Effective, Safe, Accessible Testing supplies -Medications previously tried: glimepiride  -Educated on A1c and blood sugar goals; -Recommended to continue current medication  Health Maintenance -Vaccine gaps: Shingrix, covid booster -OTC: B12 2500 mcg, magnesium, vit C, Vit D-K2, zinc, turmeric    Al Corpus, PharmD, [place], CPP Clinical Sports administrator [facility] at [facility] [phone]

## 2022-10-22 ENCOUNTER — Other Ambulatory Visit: Payer: Self-pay | Admitting: Endocrinology

## 2022-10-24 ENCOUNTER — Encounter: Payer: Self-pay | Admitting: Endocrinology

## 2022-10-24 DIAGNOSIS — E669 Obesity, unspecified: Secondary | ICD-10-CM

## 2022-10-26 MED ORDER — METFORMIN HCL 1000 MG PO TABS
ORAL_TABLET | ORAL | 2 refills | Status: DC
Start: 2022-10-26 — End: 2023-07-07

## 2022-11-21 ENCOUNTER — Other Ambulatory Visit: Payer: Self-pay | Admitting: Physician Assistant

## 2022-12-04 DIAGNOSIS — R972 Elevated prostate specific antigen [PSA]: Secondary | ICD-10-CM | POA: Diagnosis not present

## 2022-12-11 DIAGNOSIS — N5201 Erectile dysfunction due to arterial insufficiency: Secondary | ICD-10-CM | POA: Diagnosis not present

## 2022-12-11 DIAGNOSIS — R972 Elevated prostate specific antigen [PSA]: Secondary | ICD-10-CM | POA: Diagnosis not present

## 2022-12-18 DIAGNOSIS — S76311A Strain of muscle, fascia and tendon of the posterior muscle group at thigh level, right thigh, initial encounter: Secondary | ICD-10-CM | POA: Diagnosis not present

## 2022-12-26 ENCOUNTER — Other Ambulatory Visit: Payer: Self-pay | Admitting: Family Medicine

## 2022-12-26 DIAGNOSIS — E1169 Type 2 diabetes mellitus with other specified complication: Secondary | ICD-10-CM

## 2022-12-28 ENCOUNTER — Other Ambulatory Visit (INDEPENDENT_AMBULATORY_CARE_PROVIDER_SITE_OTHER): Payer: Medicare Other

## 2022-12-28 DIAGNOSIS — E669 Obesity, unspecified: Secondary | ICD-10-CM | POA: Diagnosis not present

## 2022-12-28 DIAGNOSIS — E785 Hyperlipidemia, unspecified: Secondary | ICD-10-CM

## 2022-12-28 DIAGNOSIS — R531 Weakness: Secondary | ICD-10-CM | POA: Diagnosis not present

## 2022-12-28 DIAGNOSIS — S76311D Strain of muscle, fascia and tendon of the posterior muscle group at thigh level, right thigh, subsequent encounter: Secondary | ICD-10-CM | POA: Diagnosis not present

## 2022-12-28 DIAGNOSIS — E1169 Type 2 diabetes mellitus with other specified complication: Secondary | ICD-10-CM | POA: Diagnosis not present

## 2022-12-28 LAB — COMPREHENSIVE METABOLIC PANEL
ALT: 17 U/L (ref 0–53)
AST: 15 U/L (ref 0–37)
Albumin: 4.5 g/dL (ref 3.5–5.2)
Alkaline Phosphatase: 37 U/L — ABNORMAL LOW (ref 39–117)
BUN: 18 mg/dL (ref 6–23)
CO2: 27 mEq/L (ref 19–32)
Calcium: 9.9 mg/dL (ref 8.4–10.5)
Chloride: 100 mEq/L (ref 96–112)
Creatinine, Ser: 1.05 mg/dL (ref 0.40–1.50)
GFR: 72.41 mL/min (ref >60.00)
Glucose, Bld: 132 mg/dL — ABNORMAL HIGH (ref 70–99)
Potassium: 5 mEq/L (ref 3.5–5.1)
Sodium: 136 mEq/L (ref 135–145)
Total Bilirubin: 0.5 mg/dL (ref 0.2–1.2)
Total Protein: 7.2 g/dL (ref 6.0–8.3)

## 2022-12-28 LAB — LIPID PANEL
Cholesterol: 161 mg/dL (ref 0–200)
HDL: 76.7 mg/dL (ref >39.00)
LDL Cholesterol: 68 mg/dL (ref 0–99)
NonHDL: 84.76
Total CHOL/HDL Ratio: 2
Triglycerides: 82 mg/dL (ref 0.0–149.0)
VLDL: 16.4 mg/dL (ref 0.0–40.0)

## 2022-12-28 LAB — HEMOGLOBIN A1C: Hgb A1c MFr Bld: 6.5 % (ref 4.6–6.5)

## 2022-12-28 LAB — VITAMIN B12: Vitamin B-12: 723 pg/mL (ref 211–911)

## 2022-12-28 NOTE — Addendum Note (Signed)
Addended by: Alvina Chou on: 12/28/2022 09:45 AM   Modules accepted: Orders

## 2022-12-28 NOTE — Addendum Note (Signed)
Addended by: Damita Lack on: 12/28/2022 09:41 AM   Modules accepted: Orders

## 2023-01-04 ENCOUNTER — Ambulatory Visit (INDEPENDENT_AMBULATORY_CARE_PROVIDER_SITE_OTHER): Payer: Medicare Other | Admitting: Family Medicine

## 2023-01-04 ENCOUNTER — Encounter: Payer: Self-pay | Admitting: Family Medicine

## 2023-01-04 VITALS — BP 156/86 | HR 95 | Temp 97.2°F | Ht 69.75 in | Wt 247.5 lb

## 2023-01-04 DIAGNOSIS — Z7985 Long-term (current) use of injectable non-insulin antidiabetic drugs: Secondary | ICD-10-CM

## 2023-01-04 DIAGNOSIS — N529 Male erectile dysfunction, unspecified: Secondary | ICD-10-CM | POA: Diagnosis not present

## 2023-01-04 DIAGNOSIS — Z1211 Encounter for screening for malignant neoplasm of colon: Secondary | ICD-10-CM

## 2023-01-04 DIAGNOSIS — Z7189 Other specified counseling: Secondary | ICD-10-CM

## 2023-01-04 DIAGNOSIS — F3342 Major depressive disorder, recurrent, in full remission: Secondary | ICD-10-CM

## 2023-01-04 DIAGNOSIS — E1169 Type 2 diabetes mellitus with other specified complication: Secondary | ICD-10-CM | POA: Diagnosis not present

## 2023-01-04 DIAGNOSIS — Z7984 Long term (current) use of oral hypoglycemic drugs: Secondary | ICD-10-CM

## 2023-01-04 DIAGNOSIS — E785 Hyperlipidemia, unspecified: Secondary | ICD-10-CM | POA: Diagnosis not present

## 2023-01-04 DIAGNOSIS — K2100 Gastro-esophageal reflux disease with esophagitis, without bleeding: Secondary | ICD-10-CM | POA: Diagnosis not present

## 2023-01-04 DIAGNOSIS — I10 Essential (primary) hypertension: Secondary | ICD-10-CM | POA: Diagnosis not present

## 2023-01-04 DIAGNOSIS — K760 Fatty (change of) liver, not elsewhere classified: Secondary | ICD-10-CM

## 2023-01-04 DIAGNOSIS — Z0000 Encounter for general adult medical examination without abnormal findings: Secondary | ICD-10-CM

## 2023-01-04 DIAGNOSIS — R972 Elevated prostate specific antigen [PSA]: Secondary | ICD-10-CM | POA: Diagnosis not present

## 2023-01-04 DIAGNOSIS — Z952 Presence of prosthetic heart valve: Secondary | ICD-10-CM

## 2023-01-04 MED ORDER — OMEPRAZOLE 20 MG PO CPDR: 20.0000 mg | DELAYED_RELEASE_CAPSULE | Freq: Every day | ORAL | Status: AC

## 2023-01-04 MED ORDER — ROSUVASTATIN CALCIUM 5 MG PO TABS
5.0000 mg | ORAL_TABLET | Freq: Every day | ORAL | 4 refills | Status: DC
Start: 2023-01-04 — End: 2024-01-07

## 2023-01-04 MED ORDER — LOSARTAN POTASSIUM 50 MG PO TABS: 50.0000 mg | ORAL_TABLET | Freq: Every day | ORAL | 4 refills | Status: DC

## 2023-01-04 NOTE — Assessment & Plan Note (Signed)

## 2023-01-04 NOTE — Assessment & Plan Note (Signed)
Asked to bring us a copy 

## 2023-01-04 NOTE — Progress Notes (Unsigned)
Ph: [phone] Fax: [phone]   Patient ID: Evan [name], male    DOB: 1956/04/01, 67 y.o.   MRN: [id number]  This visit was conducted in person.  BP (!) 156/86 (BP Location: Right Arm, Cuff Size: Large)   Pulse 95   Temp (!) 97.2 F (36.2 C) (Temporal)   Ht 5' 9.75" (1.772 m)   Wt 247 lb 8 oz (112.3 kg)   SpO2 98%   BMI 35.77 kg/m   BP Readings from Last 3 Encounters:  01/04/23 (!) 156/86  10/02/22 122/80  07/03/22 136/78   CC: AMW Subjective:   HPI: Evan [name] is a 67 y.o. male presenting on 01/04/2023 for Medicare Wellness   Did not see health advisor this year.   Hearing Screening   500Hz  1000Hz  2000Hz  4000Hz   Right ear 20 40 20 0  Left ear 25 40 25 0  Vision Screening - Comments:: Last eye exam, 05/2022.  Flowsheet Row Office Visit from 01/04/2023 in [facility] [place]  PHQ-2 Total Score 0          01/04/2023   10:37 AM 12/30/2021    8:59 AM 12/25/2020    1:31 PM 11/14/2020   10:10 AM 12/25/2019    8:34 AM  Fall Risk   Falls in the past year? 0 1 0 0 0  Number falls in past yr:  0 0 0   Injury with Fall?  0 0 0   Risk for fall due to :   Medication side effect    Follow up   Falls evaluation completed;Falls prevention discussed Falls evaluation completed    Severe aortic stenosis s/p TAVR 10/2020, bicuspid aortic valve followed by cardiology Jacques Navy). S/p catheterization 08/2020 without significant CAD.   Bilateral sensorineural hearing loss - told hearing aide candidate if desired. Dizziness not due to vertigo or meniere's.   DM - sees endo Dr Lucianne Muss on ozempic metformin and farxiga.   Preventative: COLONOSCOPY Date: 03/2015 2 polyps, rpt 5 yrs Madilyn Fireman @ [facility]).  ESOPHAGOGASTRODUODENOSCOPY Date: 03/2015 mild chronic gastritis, esophagitis - on daily PPI  Prostate cancer screening - Brother with prostate cancer age 52. Nocturia x1. Followed by urology Dr Berneice Heinrich for elevated PSA (6.5).  Lung cancer screening - not  eligible  Flu shot yearly COVID vaccine - Pfizer 06/2019, 07/2019, booster 03/2020 Tdap 04/2012 Pneumovax23 12/2012, 12/2019. Prevnar13 12/2018  Shingrix - discussed, declines. Has had shingles x4.  RSV - discussed.  Advanced planning discussion - has this at home. HCPOA would be wife then oldest child. Asked to bring [place] copy. Seat belt use discussed.  Sunscreen use discussed. No changing moles on skin.  Sleep - averaging 4-5 hours/night, awakens early then can't fall back asleep. Benadryl PRN, melatonin ineffective. Non smoker  Alcohol - seldom  Dentist - Q6 months  Eye exam - yearly Bowels - no constipation  Bladder - no incontinence   Lives with wife, grown children Occupation: Veterinary surgeon for [employer] Edu: masters degree Activity: walks 30+min/day Diet: good water, fruits/vegetables daily. Keeps record of caloric intake     Relevant past medical, surgical, family and social history reviewed and updated as indicated. Interim medical history since our last visit reviewed. Allergies and medications reviewed and updated. Outpatient Medications Prior to Visit  Medication Sig Dispense Refill   amLODipine (NORVASC) 5 MG tablet TAKE 1 TABLET (5 MG TOTAL) BY MOUTH DAILY. 90 tablet 3   aspirin EC 81 MG tablet Take 81 mg  by mouth every evening. Swallow whole.     Blood Glucose Monitoring Suppl (ONETOUCH VERIO FLEX SYSTEM) w/Device KIT 1 kit by Does not apply route daily. Use to check blood sugars 2 times daily 1 kit 2   cephALEXin (KEFLEX) 500 MG capsule TAKE 4 CAPSULES BY MOUTH 60 MINUTES PRIOR TO ANY DENTAL APPOINTMENTS 12 capsule 2   Cyanocobalamin (VITAMIN B-12) 2500 MCG SUBL Take 2,500 mcg by mouth daily.     FARXIGA 10 MG TABS tablet TAKE 1 TABLET (10 MG TOTAL) BY MOUTH EVERY MORNING. 30 tablet 2   fluticasone (FLONASE) 50 MCG/ACT nasal spray Place 1 spray into both nostrils daily as needed for allergies or rhinitis.     glucose blood (ACCU-CHEK GUIDE) test strip Check blood  sugar 2 times daily 100 each 12   Lancets (ACCU-CHEK MULTICLIX) lancets Check blood sugar 2 times daily 100 each 12   Magnesium 250 MG TABS Take 250 mg by mouth every evening.     metFORMIN (GLUCOPHAGE) 1000 MG tablet Take 2 tablet daily 180 tablet 2   Semaglutide, 2 MG/DOSE, (OZEMPIC, 2 MG/DOSE,) 8 MG/3ML SOPN Inject 2mg  weekly 3 mL 3   sildenafil (REVATIO) 20 MG tablet TAKE 5 TABLETS BY MOUTH ONCE DAILY AS NEEDED 30 tablet 5   tadalafil (CIALIS) 5 MG tablet Take 1 tablet by mouth once daily 30 tablet 3   TURMERIC PO Take 1,800 mg by mouth every evening.     vitamin C (ASCORBIC ACID) 500 MG tablet Take 500 mg by mouth daily.     Vitamin D-Vitamin K (K2 PLUS D3 PO) Take 1 tablet by mouth every evening.     Zinc 50 MG TABS Take 50 mg by mouth every evening.     losartan (COZAAR) 100 MG tablet TAKE 1/2 TABLET BY MOUTH DAILY 45 tablet 3   rosuvastatin (CRESTOR) 5 MG tablet TAKE 1 TABLET BY MOUTH EVERY DAY 90 tablet 3   No facility-administered medications prior to visit.     Per HPI unless specifically indicated in ROS section below Review of Systems  Objective:  BP (!) 156/86 (BP Location: Right Arm, Cuff Size: Large)   Pulse 95   Temp (!) 97.2 F (36.2 C) (Temporal)   Ht 5' 9.75" (1.772 m)   Wt 247 lb 8 oz (112.3 kg)   SpO2 98%   BMI 35.77 kg/m   Wt Readings from Last 3 Encounters:  01/04/23 247 lb 8 oz (112.3 kg)  10/02/22 242 lb (109.8 kg)  07/03/22 244 lb 8 oz (110.9 kg)      Physical Exam Vitals and nursing note reviewed.  Constitutional:      General: He is not in acute distress.    Appearance: Normal appearance. He is well-developed. He is not ill-appearing.  HENT:     Head: Normocephalic and atraumatic.     Right Ear: Hearing, tympanic membrane, ear canal and external ear normal.     Left Ear: Hearing, tympanic membrane, ear canal and external ear normal.     Nose: Nose normal.     Mouth/Throat:     Mouth: Mucous membranes are moist.     Pharynx: Oropharynx is  clear. No oropharyngeal exudate or posterior oropharyngeal erythema.  Eyes:     General: No scleral icterus.    Extraocular Movements: Extraocular movements intact.     Conjunctiva/sclera: Conjunctivae normal.     Pupils: Pupils are equal, round, and reactive to light.  Neck:     Thyroid: No thyroid mass  or thyromegaly.     Vascular: No carotid bruit.  Cardiovascular:     Rate and Rhythm: Normal rate and regular rhythm.     Pulses: Normal pulses.          Radial pulses are 2+ on the right side and 2+ on the left side.     Heart sounds: Normal heart sounds. No murmur heard. Pulmonary:     Effort: Pulmonary effort is normal. No respiratory distress.     Breath sounds: Normal breath sounds. No wheezing, rhonchi or rales.  Abdominal:     General: Bowel sounds are normal. There is no distension.     Palpations: Abdomen is soft. There is no mass.     Tenderness: There is no abdominal tenderness. There is no guarding or rebound.     Hernia: No hernia is present.  Musculoskeletal:        General: Normal range of motion.     Cervical back: Normal range of motion and neck supple.     Right lower leg: No edema.     Left lower leg: No edema.  Lymphadenopathy:     Cervical: No cervical adenopathy.  Skin:    General: Skin is warm and dry.     Findings: No rash.  Neurological:     General: No focal deficit present.     Mental Status: He is alert and oriented to person, place, and time.     Comments:  Recall 3/3 Calculation 5/5 DLROW  Psychiatric:        Mood and Affect: Mood normal.        Behavior: Behavior normal.        Thought Content: Thought content normal.        Judgment: Judgment normal.       Results for orders placed or performed in visit on 12/28/22  Vitamin B12  Result Value Ref Range   Vitamin B-12 723 211 - 911 pg/mL  Comprehensive metabolic panel  Result Value Ref Range   Sodium 136 135 - 145 mEq/L   Potassium 5.0 3.5 - 5.1 mEq/L   Chloride 100 96 - 112 mEq/L    CO2 27 19 - 32 mEq/L   Glucose, Bld 132 (H) 70 - 99 mg/dL   BUN 18 6 - 23 mg/dL   Creatinine, Ser 1.32 0.40 - 1.50 mg/dL   Total Bilirubin 0.5 0.2 - 1.2 mg/dL   Alkaline Phosphatase 37 (L) 39 - 117 U/L   AST 15 0 - 37 U/L   ALT 17 0 - 53 U/L   Total Protein 7.2 6.0 - 8.3 g/dL   Albumin 4.5 3.5 - 5.2 g/dL   GFR 44.01 >02.72 mL/min   Calcium 9.9 8.4 - 10.5 mg/dL  Lipid panel  Result Value Ref Range   Cholesterol 161 0 - 200 mg/dL   Triglycerides 53.6 0.0 - 149.0 mg/dL   HDL 64.40 >34.74 mg/dL   VLDL 25.9 0.0 - 56.3 mg/dL   LDL Cholesterol 68 0 - 99 mg/dL   Total CHOL/HDL Ratio 2    NonHDL 84.76   Hemoglobin A1c  Result Value Ref Range   Hgb A1c MFr Bld 6.5 4.6 - 6.5 %      01/04/2023   10:37 AM 07/03/2022   10:35 AM 12/30/2021    9:42 AM 12/25/2020    1:32 PM 11/14/2020   10:10 AM  Depression screen PHQ 2/9  Decreased Interest 0 0 1 0 0  Down, Depressed, Hopeless 0 0 1  0 0  PHQ - 2 Score 0 0 2 0 0  Altered sleeping 2 1 3  0   Tired, decreased energy 1 0 2 0   Change in appetite 0 1 0 0   Feeling bad or failure about yourself  0 1 0 0   Trouble concentrating 1 0 0 0   Moving slowly or fidgety/restless 0 0 0 0   Suicidal thoughts 0  0 0   PHQ-9 Score 4 3 7  0   Difficult doing work/chores Not difficult at all Not difficult at all Somewhat difficult Not difficult at all       01/04/2023   10:37 AM 07/03/2022   10:35 AM 12/30/2021    9:42 AM  GAD 7 : Generalized Anxiety Score  Nervous, Anxious, on Edge 0 0 0  Control/stop worrying 0 0 0  Worry too much - different things 0 0 0  Trouble relaxing 0 0 1  Restless 0 0 0  Easily annoyed or irritable 0 1 0  Afraid - awful might happen 0 0 0  Total GAD 7 Score 0 1 1  Anxiety Difficulty  Not difficult at all Not difficult at all   Assessment & Plan:   Problem List Items Addressed This Visit     Advanced directives, counseling/discussion (Chronic)    Asked to bring [place] a copy       Medicare annual wellness visit, subsequent  - Primary (Chronic)    I have personally reviewed the Medicare Annual Wellness questionnaire and have noted 1. The patient's medical and social history 2. Their use of alcohol, tobacco or illicit drugs 3. Their current medications and supplements 4. The patient's functional ability including ADL's, fall risks, home safety risks and hearing or visual impairment. Cognitive function has been assessed and addressed as indicated.  5. Diet and physical activity 6. Evidence for depression or mood disorders The patients weight, height, BMI have been recorded in the chart. I have made referrals, counseling and provided education to the patient based on review of the above and I have provided the pt with a written personalized care plan for preventive services. Provider list updated.. See scanned questionairre as needed for further documentation. Reviewed preventative protocols and updated unless pt declined.       Type 2 diabetes mellitus with other specified complication (HCC)    Chronic, stable period followed by endocrinology Dr Lucianne Muss on ozempic, metformin, farxiga.       Relevant Medications   losartan (COZAAR) 50 MG tablet   rosuvastatin (CRESTOR) 5 MG tablet   Other Relevant Orders   Amb ref to Medical Nutrition Therapy-MNT   Hyperlipidemia associated with type 2 diabetes mellitus (HCC)    Chronic, stable period on crestor 5mg  daily - continue. The 10-year ASCVD risk score (Arnett DK, et al., 2019) is: 34.7%   Values used to calculate the score:     Age: 89 years     Sex: Male     Is Non-Hispanic African American: No     Diabetic: Yes     Tobacco smoker: No     Systolic Blood Pressure: 156 mmHg     Is BP treated: Yes     HDL Cholesterol: 76.7 mg/dL     Total Cholesterol: 161 mg/dL       Relevant Medications   losartan (COZAAR) 50 MG tablet   rosuvastatin (CRESTOR) 5 MG tablet   Other Relevant Orders   Amb ref to Medical Nutrition Therapy-MNT   MDD (  major depressive disorder),  recurrent, in full remission (HCC)    Stable period off antidepressant      Essential hypertension    Chronic, BP elevated in office today however home readings largely well controlled. BP log provided today, he will monitor at home and let me know if consistently >140/90.  Continue amlodipine, losartan.       Relevant Medications   losartan (COZAAR) 50 MG tablet   rosuvastatin (CRESTOR) 5 MG tablet   ERECTILE DYSFUNCTION, ORGANIC    Continues generic sildenafil PRN      GERD (gastroesophageal reflux disease)    Continues omeprazole 20mg  daily.       Relevant Medications   omeprazole (PRILOSEC) 20 MG capsule   Severe obesity (BMI 35.0-39.9) with comorbidity (HCC)    Encourage healthy diet and lifestyle choices to affect sustainable weight loss. It seems he has healthy diet and regular activity routine however notes ongoing difficulty losing weight - agrees to nutritionist evaluation/refresher course.       Relevant Orders   Amb ref to Medical Nutrition Therapy-MNT   Elevated PSA    In fmhx prostate cancer, this is followed yearly [facility]).       NAFLD (nonalcoholic fatty liver disease)    Consider Hep A/B vaccination series if not already immune.       S/P TAVR (transcatheter aortic valve replacement)   Other Visit Diagnoses     Special screening for malignant neoplasms, colon       Relevant Orders   Ambulatory referral to Gastroenterology        Meds ordered this encounter  Medications   omeprazole (PRILOSEC) 20 MG capsule    Sig: Take 1 capsule (20 mg total) by mouth daily.   losartan (COZAAR) 50 MG tablet    Sig: Take 1 tablet (50 mg total) by mouth daily.    Dispense:  90 tablet    Refill:  4   rosuvastatin (CRESTOR) 5 MG tablet    Sig: Take 1 tablet (5 mg total) by mouth daily.    Dispense:  90 tablet    Refill:  4    Orders Placed This Encounter  Procedures   Ambulatory referral to Gastroenterology    Referral Priority:    Routine    Referral Type:   Consultation    Referral Reason:   Specialty Services Required    Number of Visits Requested:   1   Amb ref to Medical Nutrition Therapy-MNT    Referral Priority:   Routine    Referral Type:   Consultation    Referral Reason:   Specialty Services Required    Requested Specialty:   Nutrition    Number of Visits Requested:   1    Patient Instructions  Bring [place] copy of your living will.  We will refer you back to [facility] for repeat colonoscopy.  You are doing well today Continue current medicines.  BP was elevated today - continue monitoring at home and let me know if consistently >140/90.  Return in 6 months for follow up visit   Follow up plan: Return in about 6 months (around 07/07/2023) for follow up visit.  Eustaquio Boyden, MD

## 2023-01-04 NOTE — Patient Instructions (Addendum)
Bring [place] copy of your living will.  We will refer you back to [facility] for repeat colonoscopy.  You are doing well today Continue current medicines.  BP was elevated today - continue monitoring at home and let me know if consistently >140/90.  Return in 6 months for follow up visit

## 2023-01-05 NOTE — Assessment & Plan Note (Signed)
Continues omeprazole 53m daily.

## 2023-01-05 NOTE — Assessment & Plan Note (Signed)
Chronic, stable period on crestor 5mg  daily - continue. The 10-year ASCVD risk score (Arnett DK, et al., 2019) is: 34.7%   Values used to calculate the score:     Age: 67 years     Sex: Male     Is Non-Hispanic African American: No     Diabetic: Yes     Tobacco smoker: No     Systolic Blood Pressure: 156 mmHg     Is BP treated: Yes     HDL Cholesterol: 76.7 mg/dL     Total Cholesterol: 161 mg/dL

## 2023-01-05 NOTE — Assessment & Plan Note (Addendum)
Encourage healthy diet and lifestyle choices to affect sustainable weight loss. It seems he has healthy diet and regular activity routine however notes ongoing difficulty losing weight - agrees to nutritionist evaluation/refresher course.

## 2023-01-05 NOTE — Assessment & Plan Note (Addendum)
Chronic, stable period followed by endocrinology Dr Lucianne Muss on ozempic, metformin, farxiga.

## 2023-01-05 NOTE — Assessment & Plan Note (Signed)
Stable period off antidepressant 

## 2023-01-05 NOTE — Assessment & Plan Note (Signed)
In fmhx prostate cancer, this is followed yearly by [facility]).

## 2023-01-05 NOTE — Assessment & Plan Note (Signed)
Continues generic sildenafil PRN

## 2023-01-05 NOTE — Assessment & Plan Note (Signed)
Chronic, BP elevated in office today however home readings largely well controlled. BP log provided today, he will monitor at home and let me know if consistently >140/90.  Continue amlodipine, losartan.

## 2023-01-05 NOTE — Assessment & Plan Note (Signed)
Consider Hep A/B vaccination series if not already immune.

## 2023-01-06 ENCOUNTER — Encounter: Payer: Self-pay | Admitting: *Deleted

## 2023-01-06 DIAGNOSIS — S76311D Strain of muscle, fascia and tendon of the posterior muscle group at thigh level, right thigh, subsequent encounter: Secondary | ICD-10-CM | POA: Diagnosis not present

## 2023-01-06 DIAGNOSIS — R531 Weakness: Secondary | ICD-10-CM | POA: Diagnosis not present

## 2023-01-08 DIAGNOSIS — S76311D Strain of muscle, fascia and tendon of the posterior muscle group at thigh level, right thigh, subsequent encounter: Secondary | ICD-10-CM | POA: Diagnosis not present

## 2023-01-08 DIAGNOSIS — R531 Weakness: Secondary | ICD-10-CM | POA: Diagnosis not present

## 2023-01-12 DIAGNOSIS — S76311D Strain of muscle, fascia and tendon of the posterior muscle group at thigh level, right thigh, subsequent encounter: Secondary | ICD-10-CM | POA: Diagnosis not present

## 2023-01-12 DIAGNOSIS — R531 Weakness: Secondary | ICD-10-CM | POA: Diagnosis not present

## 2023-01-14 DIAGNOSIS — S76311D Strain of muscle, fascia and tendon of the posterior muscle group at thigh level, right thigh, subsequent encounter: Secondary | ICD-10-CM | POA: Diagnosis not present

## 2023-01-14 DIAGNOSIS — R531 Weakness: Secondary | ICD-10-CM | POA: Diagnosis not present

## 2023-01-18 DIAGNOSIS — M79661 Pain in right lower leg: Secondary | ICD-10-CM | POA: Diagnosis not present

## 2023-01-19 DIAGNOSIS — S76311D Strain of muscle, fascia and tendon of the posterior muscle group at thigh level, right thigh, subsequent encounter: Secondary | ICD-10-CM | POA: Diagnosis not present

## 2023-01-19 DIAGNOSIS — R531 Weakness: Secondary | ICD-10-CM | POA: Diagnosis not present

## 2023-01-20 ENCOUNTER — Other Ambulatory Visit: Payer: Self-pay | Admitting: Family Medicine

## 2023-01-21 DIAGNOSIS — S76311D Strain of muscle, fascia and tendon of the posterior muscle group at thigh level, right thigh, subsequent encounter: Secondary | ICD-10-CM | POA: Diagnosis not present

## 2023-01-21 DIAGNOSIS — R531 Weakness: Secondary | ICD-10-CM | POA: Diagnosis not present

## 2023-01-21 NOTE — Telephone Encounter (Signed)
Pt takes sildenafil PRN (see 01/04/23 OV notes).  Request denied.

## 2023-01-24 ENCOUNTER — Encounter: Payer: Self-pay | Admitting: Family Medicine

## 2023-01-24 ENCOUNTER — Other Ambulatory Visit: Payer: Self-pay | Admitting: Family Medicine

## 2023-01-24 DIAGNOSIS — N529 Male erectile dysfunction, unspecified: Secondary | ICD-10-CM

## 2023-01-25 MED ORDER — TADALAFIL 5 MG PO TABS
5.0000 mg | ORAL_TABLET | Freq: Every day | ORAL | 11 refills | Status: DC
Start: 2023-01-25 — End: 2024-02-07

## 2023-01-25 NOTE — Addendum Note (Signed)
Addended by: Nanci Pina on: 01/25/2023 10:19 AM   Modules accepted: Orders

## 2023-01-25 NOTE — Telephone Encounter (Signed)
Per 01/04/23 OV notes, pt takes sildenafil PRN.

## 2023-01-26 DIAGNOSIS — S76311D Strain of muscle, fascia and tendon of the posterior muscle group at thigh level, right thigh, subsequent encounter: Secondary | ICD-10-CM | POA: Diagnosis not present

## 2023-01-26 DIAGNOSIS — R531 Weakness: Secondary | ICD-10-CM | POA: Diagnosis not present

## 2023-01-28 DIAGNOSIS — S76311D Strain of muscle, fascia and tendon of the posterior muscle group at thigh level, right thigh, subsequent encounter: Secondary | ICD-10-CM | POA: Diagnosis not present

## 2023-01-28 DIAGNOSIS — R531 Weakness: Secondary | ICD-10-CM | POA: Diagnosis not present

## 2023-01-29 ENCOUNTER — Other Ambulatory Visit: Payer: Medicare Other

## 2023-02-01 DIAGNOSIS — S76311D Strain of muscle, fascia and tendon of the posterior muscle group at thigh level, right thigh, subsequent encounter: Secondary | ICD-10-CM | POA: Diagnosis not present

## 2023-02-01 DIAGNOSIS — R531 Weakness: Secondary | ICD-10-CM | POA: Diagnosis not present

## 2023-02-02 ENCOUNTER — Encounter: Payer: Self-pay | Admitting: Endocrinology

## 2023-02-02 ENCOUNTER — Ambulatory Visit (INDEPENDENT_AMBULATORY_CARE_PROVIDER_SITE_OTHER): Payer: Medicare Other | Admitting: Endocrinology

## 2023-02-02 VITALS — BP 132/82 | HR 89 | Ht 69.0 in | Wt 248.2 lb

## 2023-02-02 DIAGNOSIS — E669 Obesity, unspecified: Secondary | ICD-10-CM

## 2023-02-02 DIAGNOSIS — Z7984 Long term (current) use of oral hypoglycemic drugs: Secondary | ICD-10-CM | POA: Diagnosis not present

## 2023-02-02 DIAGNOSIS — E1169 Type 2 diabetes mellitus with other specified complication: Secondary | ICD-10-CM

## 2023-02-02 DIAGNOSIS — Z7985 Long-term (current) use of injectable non-insulin antidiabetic drugs: Secondary | ICD-10-CM | POA: Diagnosis not present

## 2023-02-02 DIAGNOSIS — I10 Essential (primary) hypertension: Secondary | ICD-10-CM | POA: Diagnosis not present

## 2023-02-02 MED ORDER — TIRZEPATIDE 10 MG/0.5ML ~~LOC~~ SOAJ: 10.0000 mg | SUBCUTANEOUS | 1 refills | Status: DC

## 2023-02-02 MED ORDER — TIRZEPATIDE 5 MG/0.5ML ~~LOC~~ SOAJ: 5.0000 mg | SUBCUTANEOUS | 1 refills | Status: DC

## 2023-02-02 NOTE — Progress Notes (Signed)
Patient ID: Evan [name], male   DOB: 29-May-1956, 67 y.o.   MRN: [id number]          Reason for Appointment:Type 2 Diabetes and related problems   History of Present Illness:          Date of diagnosis of type 2 diabetes mellitus: 2012        Background history:   Diagnosed incidentally with a high blood sugar Has been on metformin since diagnosis and subsequently Amaryl had been added He has had sporadic follow-up for the last few years His A1c had been below 7 to about 08/2015 Subsequently in 9/17 his A1c had gone up to 8%  Recent history:      Non-insulin hypoglycemic drugs : Farxiga 10 mg,  metformin 1 g at dinner, 1 g at bedtime, Ozempic 2  mg weekly  His A1c is 6.5 last month  Current management, blood sugar patterns and problems identified: He has continued fairly good blood sugar control However he is concerned about weight gain this year; also he thinks that his lowest weight he had been down to 211 pounds He says he has not changed his diet and is still watching his portions and carbohydrates Also as before he is doing walking and resistance exercises 5 days a week He did not bring his monitor for download but is now using the OTC ReliOn monitor since he had damage to his other meter Lab glucose last month 132 and he says that his home readings are usually under 130 in the morning and 160 after meals   Meals: Usually skipping breakfast, dinner is usually a protein with vegetables or salad, low-fat intake       Side effects from medications have been: None  Glucose monitoring:  done 1 or less times a day         Glucometer: One Touch Verio  .      Blood Glucose readings as above Previous readings:   PRE-MEAL Fasting Lunch Dinner Bedtime Overall  Glucose range: 111-169      Mean/median: 134 121 108  125   POST-MEAL PC Breakfast PC Lunch PC Dinner  Glucose range:   112, 123  Mean/median:        Self-care: The diet that the patient has been following is:  tries to reduce high-fat and fried foods, Carbohydrates and avoiding drinks with sugar .       Dietician visit, most recent: 02/2017  Weight history: Highest previous weight 287 in 2019  Wt Readings from Last 3 Encounters:  02/02/23 248 lb 3.2 oz (112.6 kg)  01/04/23 247 lb 8 oz (112.3 kg)  10/02/22 242 lb (109.8 kg)    Glycemic control:   Lab Results  Component Value Date   HGBA1C 6.5 12/28/2022   HGBA1C 6.3 09/29/2022   HGBA1C 6.4 06/02/2022   Lab Results  Component Value Date   MICROALBUR 1.4 09/29/2022   LDLCALC 68 12/28/2022   CREATININE 1.05 12/28/2022   Lab Results  Component Value Date   MICRALBCREAT 1.2 09/29/2022    Lab Results  Component Value Date   FRUCTOSAMINE 272 12/25/2020   FRUCTOSAMINE 224 12/19/2019   FRUCTOSAMINE 239 12/13/2017      Allergies as of 02/02/2023       Reactions   Penicillins    Respiratory distress as infant        Medication List        Accurate as of February 02, 2023  2:35 PM. If you  have any questions, ask your nurse or doctor.          STOP taking these medications    Ozempic (2 MG/DOSE) 8 MG/3ML Sopn Generic drug: Semaglutide (2 MG/DOSE) Stopped by: Reather Littler       TAKE these medications    Accu-Chek Guide test strip Generic drug: glucose blood Check blood sugar 2 times daily   accu-chek multiclix lancets Check blood sugar 2 times daily   amLODipine 5 MG tablet Commonly known as: NORVASC TAKE 1 TABLET (5 MG TOTAL) BY MOUTH DAILY.   ascorbic acid 500 MG tablet Commonly known as: VITAMIN C Take 500 mg by mouth daily.   aspirin EC 81 MG tablet Take 81 mg by mouth every evening. Swallow whole.   cephALEXin 500 MG capsule Commonly known as: KEFLEX TAKE 4 CAPSULES BY MOUTH 60 MINUTES PRIOR TO ANY DENTAL APPOINTMENTS   Farxiga 10 MG Tabs tablet Generic drug: dapagliflozin propanediol TAKE 1 TABLET (10 MG TOTAL) BY MOUTH EVERY MORNING.   fluticasone 50 MCG/ACT nasal spray Commonly known as:  FLONASE Place 1 spray into both nostrils daily as needed for allergies or rhinitis.   K2 PLUS D3 PO Take 1 tablet by mouth every evening.   losartan 50 MG tablet Commonly known as: COZAAR Take 1 tablet (50 mg total) by mouth daily.   Magnesium 250 MG Tabs Take 250 mg by mouth every evening.   metFORMIN 1000 MG tablet Commonly known as: GLUCOPHAGE Take 2 tablet daily   omeprazole 20 MG capsule Commonly known as: PRILOSEC Take 1 capsule (20 mg total) by mouth daily.   OneTouch Verio Flex System w/Device Kit 1 kit by Does not apply route daily. Use to check blood sugars 2 times daily   rosuvastatin 5 MG tablet Commonly known as: CRESTOR Take 1 tablet (5 mg total) by mouth daily.   sildenafil 20 MG tablet Commonly known as: REVATIO TAKE 5 TABLETS BY MOUTH ONCE DAILY AS NEEDED   tadalafil 5 MG tablet Commonly known as: CIALIS Take 1 tablet (5 mg total) by mouth daily.   tirzepatide 5 MG/0.5ML Pen Commonly known as: MOUNJARO Inject 5 mg into the skin once a week. Started by: Reather Littler   tirzepatide 10 MG/0.5ML Pen Commonly known as: MOUNJARO Inject 10 mg into the skin once a week. Started by: Reather Littler   TURMERIC PO Take 1,800 mg by mouth every evening.   Vitamin B-12 2500 MCG Subl Take 2,500 mcg by mouth daily.   Zinc 50 MG Tabs Take 50 mg by mouth every evening.        Allergies:  Allergies  Allergen Reactions   Penicillins     Respiratory distress as infant    Past Medical History:  Diagnosis Date   Adrenal adenoma    Complication of anesthesia    difficulty to weak up   COVID-19 virus infection 05/2020   Depression    intolerant of cymbalta and wellbutrin   Diverticulosis of colon    Esophagitis 03/2015   by EGD   Gastritis 03/2015   by EGD - mild, chronic   Hyperlipidemia    Hypertension    Left ventricular hypertrophy    Lichen simplex chronicus 05/2015   R scrotum Terri Piedra)   Liver lesion    2022 CTs: There was an incidental  finding of a hypervascular lesion in the right lobe of the liver as well as left adrenal nodule. He underwent MRI of the abdomen showing the appearance of the liver  favored focal nodular hyperplasia, as well as small left adrenal adenoma.   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 Clifton James & Dr. Laneta Simmers    Severe aortic stenosis    T2DM (type 2 diabetes mellitus) (HCC)    DSME 09/2013    Past Surgical History:  Procedure Laterality Date   COLONOSCOPY  2006   diverticulosis per pt   COLONOSCOPY  03/2015   2 polyps, rpt 5 yrs Madilyn Fireman)   ESOPHAGOGASTRODUODENOSCOPY  03/2015   mild chronic gastritis, esophagitis    INTRAOPERATIVE TRANSTHORACIC ECHOCARDIOGRAM Left 10/29/2020   Procedure: INTRAOPERATIVE TRANSTHORACIC ECHOCARDIOGRAM;  Surgeon: Kathleene Hazel, MD;  Location: [facility] OR;  Service: Open Heart Surgery;  Laterality: Left;   LUMBAR LAMINECTOMY  1989   L5-S1   RIGHT/LEFT HEART CATH AND CORONARY ANGIOGRAPHY N/A 08/15/2020   Procedure: RIGHT/LEFT HEART CATH AND CORONARY ANGIOGRAPHY;  Surgeon: Kathleene Hazel, MD;  Location: [facility] INVASIVE CV LAB;  Service: Cardiovascular;  Laterality: N/A;   TRANSCATHETER AORTIC VALVE REPLACEMENT, TRANSFEMORAL Bilateral 10/29/2020   Procedure: TRANSCATHETER AORTIC VALVE REPLACEMENT, TRANSFEMORAL;  Surgeon: Kathleene Hazel, MD;  Location: [facility] OR;  Service: Open Heart Surgery;  Laterality: Bilateral;   ULTRASOUND GUIDANCE FOR VASCULAR ACCESS Bilateral 10/29/2020   Procedure: ULTRASOUND GUIDANCE FOR VASCULAR ACCESS;  Surgeon: Kathleene Hazel, MD;  Location: [facility] OR;  Service: Open Heart Surgery;  Laterality: Bilateral;    Family History  Problem Relation Age of Onset   Alzheimer's disease Father    Hypertension Father    Stroke Father     Hyperlipidemia Father    Prostate cancer Brother    Diabetes Brother    Alcohol abuse Brother    CAD Brother    Coronary artery disease Maternal Uncle    Schizophrenia Paternal Uncle    Emphysema Mother    Alcohol abuse Mother    Stroke Maternal Grandmother    Stroke Maternal Grandfather    Diabetes Maternal Grandfather    Stroke Paternal Grandmother    Stroke Paternal Grandfather     Social History:  reports that he has never smoked. He has never used smokeless tobacco. He reports that he does not currently use alcohol. He reports that he does not use drugs.   Review of Systems  Lipid history: On Crestor 5 mg from his PCP and his LDL is again below 100 Previously high triglycerides were related to alcohol intake last month and he has stopped alcohol intake   Lab Results  Component Value Date   CHOL 161 12/28/2022   CHOL 187 06/02/2022   CHOL 189 12/23/2021   Lab Results  Component Value Date   HDL 76.70 12/28/2022   HDL 78.60 06/02/2022   HDL 64.40 12/23/2021   Lab Results  Component Value Date   LDLCALC 68 12/28/2022   LDLCALC 86 06/02/2022   LDLCALC 82 12/25/2020   Lab Results  Component Value Date   TRIG 82.0 12/28/2022   TRIG 114.0 06/02/2022   TRIG 263.0 (H) 12/23/2021   Lab Results  Component Value Date   CHOLHDL 2 12/28/2022   CHOLHDL 2 06/02/2022   CHOLHDL 3 12/23/2021   Lab Results  Component Value Date   LDLDIRECT 89.0 12/23/2021   LDLDIRECT 99.0 05/20/2015   LDLDIRECT 153.6 12/03/2009  Hypertension: On Losartan 50mg  tablet daily with good control previously  Has a home monitor and blood pressures are around 138/80 Blood pressure was significantly higher on first measurement today and also last month with PCP  BP Readings from Last 3 Encounters:  02/02/23 132/82  01/04/23 (!) 156/86  10/02/22 122/80    Most recent foot exam: 8/23  ADRENAL adenoma: He has a small left 1.8 cm adrenal adenoma since 2018 and his PCP checked  aldosterone levels There was no change in size of the adenoma and likely to be insignificant  He has had some hepatic steatosis on previous radiological studies  Lab Results  Component Value Date   ALT 17 12/28/2022     Physical Examination:  BP 132/82   Pulse 89   Ht 5\' 9"  (1.753 m)   Wt 248 lb 3.2 oz (112.6 kg)   SpO2 97%   BMI 36.65 kg/m    ASSESSMENT:  Diabetes type 2, non-insulin-dependent  See history of present illness for detailed discussion of current diabetes management, blood sugar patterns and problems identified     A1c is 6.4  Currently on 3 drug regimen including Farxiga, metformin 1 g daily and Ozempic 2 mg weekly  His blood sugars are somewhat better in the morning with increasing metformin Again A1c is excellent and blood sugars are controlled including after meals although most of his monitoring is in the morning still Has done fairly well with his diet and exercise regimen Although he has lost about 45 pounds over the last few years with both Ozempic and lifestyle changes he appears to have reached a plateau   HYPERTENSION: Blood pressure is somewhat higher compared to previous levels Likely needs to increase losartan but will have him follow-up with his cardiologist next week to decide    PLAN:    He will be given a trial of Mounjaro instead of Ozempic to see if he has better success with his weight management which would also help him long-term with diabetes control He will start with 5 mg weekly and after 1 month go up to 10 mg Written prescription for 10 mg given Meanwhile continue regimen of moderation of carbohydrate and caloric intake as well as regular exercise     There are no Patient Instructions on file for this visit.   Reather Littler 02/02/2023, 2:35 PM   Note: This office note was prepared with Dragon voice recognition system technology. Any transcriptional errors that result from this process are unintentional.

## 2023-02-03 DIAGNOSIS — R531 Weakness: Secondary | ICD-10-CM | POA: Diagnosis not present

## 2023-02-03 DIAGNOSIS — S76311D Strain of muscle, fascia and tendon of the posterior muscle group at thigh level, right thigh, subsequent encounter: Secondary | ICD-10-CM | POA: Diagnosis not present

## 2023-02-08 ENCOUNTER — Ambulatory Visit: Payer: Medicare Other | Admitting: Internal Medicine

## 2023-02-09 ENCOUNTER — Ambulatory Visit: Payer: Medicare Other | Attending: Internal Medicine | Admitting: Internal Medicine

## 2023-02-09 VITALS — BP 136/70 | HR 75 | Ht 71.0 in | Wt 248.4 lb

## 2023-02-09 DIAGNOSIS — Z79899 Other long term (current) drug therapy: Secondary | ICD-10-CM | POA: Diagnosis not present

## 2023-02-09 DIAGNOSIS — I350 Nonrheumatic aortic (valve) stenosis: Secondary | ICD-10-CM | POA: Insufficient documentation

## 2023-02-09 DIAGNOSIS — I10 Essential (primary) hypertension: Secondary | ICD-10-CM | POA: Diagnosis not present

## 2023-02-09 DIAGNOSIS — I471 Supraventricular tachycardia, unspecified: Secondary | ICD-10-CM | POA: Insufficient documentation

## 2023-02-09 DIAGNOSIS — Q231 Congenital insufficiency of aortic valve: Secondary | ICD-10-CM | POA: Diagnosis not present

## 2023-02-09 DIAGNOSIS — E785 Hyperlipidemia, unspecified: Secondary | ICD-10-CM | POA: Insufficient documentation

## 2023-02-09 DIAGNOSIS — E1169 Type 2 diabetes mellitus with other specified complication: Secondary | ICD-10-CM | POA: Insufficient documentation

## 2023-02-09 DIAGNOSIS — R9431 Abnormal electrocardiogram [ECG] [EKG]: Secondary | ICD-10-CM | POA: Insufficient documentation

## 2023-02-09 DIAGNOSIS — Z952 Presence of prosthetic heart valve: Secondary | ICD-10-CM | POA: Diagnosis not present

## 2023-02-09 DIAGNOSIS — G4733 Obstructive sleep apnea (adult) (pediatric): Secondary | ICD-10-CM | POA: Insufficient documentation

## 2023-02-09 MED ORDER — AMLODIPINE BESYLATE 10 MG PO TABS
10.0000 mg | ORAL_TABLET | Freq: Every day | ORAL | 3 refills | Status: DC
Start: 2023-02-09 — End: 2024-02-21

## 2023-02-09 NOTE — Progress Notes (Signed)
Cardiology Office Note:    Date:  02/09/2023  ID:  Evan [name], DOB 1956-03-21, MRN [id number]  PCP:  Evan Boyden, MD  Cardiologist:  Evan Poisson, MD  Electrophysiologist:  None   Referring MD: Evan Boyden, MD   Chief Complaint/Reason for Referral: Severe aortic valve stenosis of BAV, s/p TAVR  History of Present Illness:    Evan [name] is a 68 y.o. male with a history of depression, hyperlipidemia, hypertension, diabetes well controlled, nonalcoholic fatty liver disease, who presents today for evaluation of systolic murmur found to have aortic valve stenosis, S/P TAVR 10/29/20.  Echo 07/11/20 showed LVEF=55-60%, severe LVH, bicuspid AoV with severe calcification of the aortic valve leaflets with a mean gradient of 41 mmHg, peak gradient 59 mmHg. AVA 1.24 cm2, dimensionless index 0.35. He was referred to Dr. Clifton [name] with the structural heart team. At this visit, he described resting chest pain but no exertional dyspnea or chest pain. Cardiac cath 08/15/20 showed no evidence of CAD. Gated cardiac CT 08/22/20 with bicuspid aortic valve with thickened and calcified leaflets with partial fusion of the right and left coronary cusps with severely reduced cusp separation c/w severe AS. Valve calcium score of 2886. AV area 559 mm2 suitable for a 29 mm Edwards Sapien 3 valve (Edwards rep has commented on the use of a 26 mm Sapien 3 valve).  CTA of the chest/abdomen and pelvis with anatomy suitable for transfemoral approach to TAVR. Incidental finding of a hypervascular lesion in the right lobe of the liver as well as left adrenal nodule. Follow up MRI favored focal nodular hyperplasia and confirmed the presence of an adrenal nodule. He was seen back in the office in April and reported chest pressure and palpitations. The patient was not interested in pursuing open heart surgery. He was felt to be a reasonable candidate for TAVR which was scheduled for 10/29/20. He was seen in the ER for  chest pain and shortness of breath. He ruled out for MI and overall examination was normal. He was discharged home with plans to return for planned TAVR 10/29/20.   He was evaluated by the multidisciplinary valve team and underwent successful TAVR with a 29 mm Edwards Sapien 3 THV via the TF approach on 10/29/20. Post operative echo showed EF 55%, normally functioning TAVR with a mean gradient of 6.0 mmHg and trivial PVL. He was discharged home on aspirin and started on Plavix 75 mg daily. He has done well in follow up with improvement in chest pain.   He was seen in the office on 05/28/21 by Dr. Servando [name] for evaluation of syncope using the bathroom. Zio XT showed SVT into the 200s and he was started on Toprol XL 12.5mg  daily. Also started on Lasix 2x a week for LE edema.   Did not take Toprol xl, cutting back on alcohol and caffeine seemed to help. Never took lasix and not having issues with swelling. No CP or SOB. Trace LE edema, no orthopnea or PND. Occasional dizziness when he bends over but no syncope. Recommended compression socks to assist since BP still mildly elevated. No blood in stool or urine. No palpitations.   Doing well from cardiovascular perspective. Mildly elevated blood pressure. Discussed increased amlodipine vs losartan. Will increase amlodipine to get goal BP 120/80.  Past Medical History:  Diagnosis Date   Adrenal adenoma    Complication of anesthesia    difficulty to weak up   COVID-19 virus infection 05/2020   Depression  intolerant of cymbalta and wellbutrin   Diverticulosis of colon    Esophagitis 03/2015   by EGD   Gastritis 03/2015   by EGD - mild, chronic   Hyperlipidemia    Hypertension    Left ventricular hypertrophy    Lichen simplex chronicus 05/2015   R scrotum Evan [name])   Liver lesion    2022 CTs: There was an incidental finding of a hypervascular lesion in the right lobe of the liver as well as left adrenal nodule. He underwent MRI of the abdomen showing  the appearance of the liver favored focal nodular hyperplasia, as well as small left adrenal adenoma.   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 Clifton [name] & Dr. Laneta [name]    Severe aortic stenosis    T2DM (type 2 diabetes mellitus) (HCC)    DSME 09/2013    Past Surgical History:  Procedure Laterality Date   COLONOSCOPY  2006   diverticulosis per pt   COLONOSCOPY  03/2015   2 polyps, rpt 5 yrs Evan [name])   ESOPHAGOGASTRODUODENOSCOPY  03/2015   mild chronic gastritis, esophagitis    INTRAOPERATIVE TRANSTHORACIC ECHOCARDIOGRAM Left 10/29/2020   Procedure: INTRAOPERATIVE TRANSTHORACIC ECHOCARDIOGRAM;  Surgeon: Evan Hazel, MD;  Location: [facility] OR;  Service: Open Heart Surgery;  Laterality: Left;   LUMBAR LAMINECTOMY  1989   L5-S1   RIGHT/LEFT HEART CATH AND CORONARY ANGIOGRAPHY N/A 08/15/2020   Procedure: RIGHT/LEFT HEART CATH AND CORONARY ANGIOGRAPHY;  Surgeon: Evan Hazel, MD;  Location: [facility] INVASIVE CV LAB;  Service: Cardiovascular;  Laterality: N/A;   TRANSCATHETER AORTIC VALVE REPLACEMENT, TRANSFEMORAL Bilateral 10/29/2020   Procedure: TRANSCATHETER AORTIC VALVE REPLACEMENT, TRANSFEMORAL;  Surgeon: Evan Hazel, MD;  Location: [facility] OR;  Service: Open Heart Surgery;  Laterality: Bilateral;   ULTRASOUND GUIDANCE FOR VASCULAR ACCESS Bilateral 10/29/2020   Procedure: ULTRASOUND GUIDANCE FOR VASCULAR ACCESS;  Surgeon: Evan Hazel, MD;  Location: [facility] OR;  Service: Open Heart Surgery;  Laterality: Bilateral;    Current Medications: Current Meds  Medication Sig   amLODipine (NORVASC) 10 MG tablet Take 1 tablet (10 mg total) by mouth daily.   aspirin EC 81 MG tablet Take 81 mg by mouth every evening. Swallow whole.   Blood Glucose Monitoring Suppl (ONETOUCH VERIO  FLEX SYSTEM) w/Device KIT 1 kit by Does not apply route daily. Use to check blood sugars 2 times daily   cephALEXin (KEFLEX) 500 MG capsule TAKE 4 CAPSULES BY MOUTH 60 MINUTES PRIOR TO ANY DENTAL APPOINTMENTS   Cyanocobalamin (VITAMIN B-12) 2500 MCG SUBL Take 2,500 mcg by mouth daily.   FARXIGA 10 MG TABS tablet TAKE 1 TABLET (10 MG TOTAL) BY MOUTH EVERY MORNING.   fluticasone (FLONASE) 50 MCG/ACT nasal spray Place 1 spray into both nostrils daily as needed for allergies or rhinitis.   glucose blood (ACCU-CHEK GUIDE) test strip Check blood sugar 2 times daily   Lancets (ACCU-CHEK MULTICLIX) lancets Check blood sugar 2 times daily   losartan (COZAAR) 50 MG tablet Take 1 tablet (50 mg total) by mouth daily.   Magnesium 250 MG TABS Take 250 mg by mouth every evening.   metFORMIN (GLUCOPHAGE) 1000 MG tablet Take 2 tablet daily   omeprazole (PRILOSEC) 20 MG capsule Take 1 capsule (20 mg total) by mouth daily.  rosuvastatin (CRESTOR) 5 MG tablet Take 1 tablet (5 mg total) by mouth daily.   sildenafil (REVATIO) 20 MG tablet TAKE 5 TABLETS BY MOUTH ONCE DAILY AS NEEDED   tadalafil (CIALIS) 5 MG tablet Take 1 tablet (5 mg total) by mouth daily.   TURMERIC PO Take 1,800 mg by mouth every evening.   vitamin C (ASCORBIC ACID) 500 MG tablet Take 500 mg by mouth daily.   Vitamin D-Vitamin K (K2 PLUS D3 PO) Take 1 tablet by mouth every evening.   Zinc 50 MG TABS Take 50 mg by mouth every evening.   [DISCONTINUED] amLODipine (NORVASC) 5 MG tablet TAKE 1 TABLET (5 MG TOTAL) BY MOUTH DAILY.     Allergies:   Penicillins   Social History   Tobacco Use   Smoking status: Never   Smokeless tobacco: Never  Substance Use Topics   Alcohol use: Not Currently    Alcohol/week: 0.0 standard drinks of alcohol    Comment: occasional   Drug use: No     Family History: The patient's family history includes Alcohol abuse in his brother and mother; Alzheimer's disease in his father; CAD in his brother; Coronary  artery disease in his maternal uncle; Diabetes in his brother and maternal grandfather; Emphysema in his mother; Hyperlipidemia in his father; Hypertension in his father; Prostate cancer in his brother; Schizophrenia in his paternal uncle; Stroke in his father, maternal grandfather, maternal grandmother, paternal grandfather, and paternal grandmother.  ROS:   Please see the history of present illness.     All other systems reviewed and are negative.  EKGs/Labs/Other Studies Reviewed:    The following studies were reviewed today:  Vas US Carotid Duplex Bilateral 09/25/2020: Right Carotid: The extracranial vessels were near-normal with only minimal  wall                 thickening or plaque.   Left Carotid: The extracranial vessels were near-normal with only minimal  wall                thickening or plaque.   Vertebrals:  Bilateral vertebral arteries demonstrate antegrade flow.  Subclavians: Normal flow hemodynamics were seen in bilateral subclavian               arteries.  Right/Left Heart Cath 08/15/2020: 1. No angiographic evidence of CAD 2. Severe aortic stenosis by echo with likely bicuspid aortic valve. Cath with mean gradient 34 mmHg, peak to peak gradient 44 mmHg.    Recommendations: Will continue workup for AVR vs TAVR. Will plan CT scans and then will review with valve team. At this time he is relatively asymptomatic and we may be able to follow his aortic stenosis for now.    Echo 07/11/2020: 1. Aortic valve is bicuspid or functionally bicuspid. Aortic stenosis is  severe with peak/mean transaortic gradients 68/41 mmHg.   2. Left ventricular ejection fraction, by estimation, is 55 to 60%. The  left ventricle has normal function. The left ventricle has no regional  wall motion abnormalities. There is severe concentric left ventricular  hypertrophy. Left ventricular diastolic   parameters are consistent with Grade I diastolic dysfunction (impaired  relaxation). Elevated  left atrial pressure.   3. Right ventricular systolic function is normal. The right ventricular  size is normal. There is normal pulmonary artery systolic pressure. The  estimated right ventricular systolic pressure is 26.8 mmHg.   4. Left atrial size was mildly dilated.   5. The mitral valve is normal in structure.  Trivial mitral valve  regurgitation. No evidence of mitral stenosis.   6. The aortic valve is bicuspid. There is severe calcifcation of the  aortic valve. There is severe thickening of the aortic valve. Aortic valve  regurgitation is mild. Severe aortic valve stenosis. Aortic valve mean  gradient measures 41.0 mmHg.   7. The inferior vena cava is normal in size with greater than 50%  respiratory variability, suggesting right atrial pressure of 3 mmHg.   EKG:  EKG Interpretation Date/Time:  Tuesday February 09 2023 09:42:03 EDT Ventricular Rate:  78 PR Interval:  174 QRS Duration:  70 QT Interval:  358 QTC Calculation: 408 R Axis:   -6  Text Interpretation: Normal sinus rhythm poor R wave progression Confirmed by Weston Brass ([id number]) on 02/09/2023 11:47:38 AM   02/12/22 - NSR 10/22/2020: EKG was not ordered today.  07/25/2020: NSR  Recent Labs: 12/28/2022: ALT 17; BUN 18; Creatinine, Ser 1.05; Potassium 5.0; Sodium 136  Recent Lipid Panel    Component Value Date/Time   CHOL 161 12/28/2022 0933   TRIG 82.0 12/28/2022 0933   HDL 76.70 12/28/2022 0933   CHOLHDL 2 12/28/2022 0933   VLDL 16.4 12/28/2022 0933   LDLCALC 68 12/28/2022 0933   LDLDIRECT 89.0 12/23/2021 0907    Physical Exam:    VS:  BP 136/70   Pulse 75   Ht 5\' 11"  (1.803 m)   Wt 248 lb 6.4 oz (112.7 kg)   SpO2 98%   BMI 34.64 kg/m     Wt Readings from Last 5 Encounters:  02/09/23 248 lb 6.4 oz (112.7 kg)  02/02/23 248 lb 3.2 oz (112.6 kg)  01/04/23 247 lb 8 oz (112.3 kg)  10/02/22 242 lb (109.8 kg)  07/03/22 244 lb 8 oz (110.9 kg)    Constitutional: No acute distress Cardiovascular:  regular rhythm, normal rate, 2/6 systolic ejection murmur early peaking. No jugular venous distention.  Respiratory: clear to auscultation bilaterally GI : normal bowel sounds, soft and nontender. No distention.   MSK: extremities warm, well perfused. No edema.  NEURO: grossly nonfocal exam, moves all extremities. PSYCH: alert and oriented x 3, normal mood and affect.   ASSESSMENT:    1. Essential hypertension   2. Nonspecific abnormal electrocardiogram (ECG) (EKG)   3. Medication management   4. History of transcatheter aortic valve replacement (TAVR)   5. Severe aortic stenosis   6. Bicuspid aortic valve   7. SVT (supraventricular tachycardia)   8. OSA (obstructive sleep apnea)   9. Hyperlipidemia associated with type 2 diabetes mellitus (HCC)      PLAN:    Severe aortic valve stenosis of BAV S/p TAVR Abnormal ekg -s/p TAVR on 10/29/2020.  Stable.  -Continue aspirin 81 mg daily and statin -Discussed again family screening of first deg relatives for BAV and aortopathy. His children live locally and I would be happy to see them for echo and screening consult.  - repeat echo due to systolic murmur today and abnormal ekg  SVT Syncope - No recurrence, no symptoms. Not on medication therapy. Observe.  Essential hypertension -  Medication management. -Blood pressure mildly elevated, would continue losartan 50 mg daily and increase amlodipine to 10 mg daily  Hyperlipidemia -Continue Crestor 5 mg daily. Lipids excellent.   LE edema -venous varicosities, compression socks recommended. May be amlodipine effect but needed for BP. Will uptitrate amlodipine and observe.  Chest pain/pressure - minimal recurrence. Observe.    Medication Adjustments/Labs and Tests Ordered: Current medicines are reviewed  at length with the patient today.  Concerns regarding medicines are outlined above.   Orders Placed This Encounter  Procedures   EKG 12-Lead   EKG 12-Lead   ECHOCARDIOGRAM  COMPLETE    Meds ordered this encounter  Medications   amLODipine (NORVASC) 10 MG tablet    Sig: Take 1 tablet (10 mg total) by mouth daily.    Dispense:  90 tablet    Refill:  3    Patient Instructions  Medication Instructions:   Increase Norvasc to 10 mg Daily   *If you need a refill on your cardiac medications before your next appointment, please call your pharmacy*   Lab Work: NONE   If you have labs (blood work) drawn today and your tests are completely normal, you will receive your results only by: MyChart Message (if you have MyChart) OR A paper copy in the mail If you have any lab test that is abnormal or we need to change your treatment, we will call you to review the results.   Testing/Procedures: Your physician has requested that you have an echocardiogram. Echocardiography is a painless test that uses sound waves to create images of your heart. It provides your doctor with information about the size and shape of your heart and how well your heart's chambers and valves are working. This procedure takes approximately one hour. There are no restrictions for this procedure. Please do NOT wear cologne, perfume, aftershave, or lotions (deodorant is allowed). Please arrive 15 minutes prior to your appointment time.    Follow-Up: At [facility], you and your health needs are our priority.  As part of our continuing mission to provide you with exceptional heart care, we have created designated Provider Care Teams.  These Care Teams include your primary Cardiologist (physician) and Advanced Practice Providers (APPs -  Physician Assistants and Nurse Practitioners) who all work together to provide you with the care you need, when you need it.  We recommend signing up for the patient portal called "MyChart".  Sign up information is provided on this After Visit Summary.  MyChart is used to connect with patients for Virtual Visits (Telemedicine).  Patients are able to  view lab/test results, encounter notes, upcoming appointments, etc.  Non-urgent messages can be sent to your provider as well.   To learn more about what you can do with MyChart, go to [email or web address].    Your next appointment:   1 year(s)  Provider:   Parke Poisson, MD     Other Instructions Thank you for choosing [facility]!

## 2023-02-09 NOTE — Patient Instructions (Signed)
Medication Instructions:   Increase Norvasc to 10 mg Daily   *If you need a refill on your cardiac medications before your next appointment, please call your pharmacy*   Lab Work: NONE   If you have labs (blood work) drawn today and your tests are completely normal, you will receive your results only by: MyChart Message (if you have MyChart) OR A paper copy in the mail If you have any lab test that is abnormal or we need to change your treatment, we will call you to review the results.   Testing/Procedures: Your physician has requested that you have an echocardiogram. Echocardiography is a painless test that uses sound waves to create images of your heart. It provides your doctor with information about the size and shape of your heart and how well your heart's chambers and valves are working. This procedure takes approximately one hour. There are no restrictions for this procedure. Please do NOT wear cologne, perfume, aftershave, or lotions (deodorant is allowed). Please arrive 15 minutes prior to your appointment time.    Follow-Up: At [facility], you and your health needs are our priority.  As part of our continuing mission to provide you with exceptional heart care, we have created designated Provider Care Teams.  These Care Teams include your primary Cardiologist (physician) and Advanced Practice Providers (APPs -  Physician Assistants and Nurse Practitioners) who all work together to provide you with the care you need, when you need it.  We recommend signing up for the patient portal called "MyChart".  Sign up information is provided on this After Visit Summary.  MyChart is used to connect with patients for Virtual Visits (Telemedicine).  Patients are able to view lab/test results, encounter notes, upcoming appointments, etc.  Non-urgent messages can be sent to your provider as well.   To learn more about what you can do with MyChart, go to [email or web address].     Your next appointment:   1 year(s)  Provider:   Parke Poisson, MD     Other Instructions Thank you for choosing [facility]!

## 2023-02-16 DIAGNOSIS — R531 Weakness: Secondary | ICD-10-CM | POA: Diagnosis not present

## 2023-02-16 DIAGNOSIS — S76311D Strain of muscle, fascia and tendon of the posterior muscle group at thigh level, right thigh, subsequent encounter: Secondary | ICD-10-CM | POA: Diagnosis not present

## 2023-02-19 DIAGNOSIS — S76311D Strain of muscle, fascia and tendon of the posterior muscle group at thigh level, right thigh, subsequent encounter: Secondary | ICD-10-CM | POA: Diagnosis not present

## 2023-02-19 DIAGNOSIS — R531 Weakness: Secondary | ICD-10-CM | POA: Diagnosis not present

## 2023-02-22 DIAGNOSIS — R531 Weakness: Secondary | ICD-10-CM | POA: Diagnosis not present

## 2023-02-22 DIAGNOSIS — S76311D Strain of muscle, fascia and tendon of the posterior muscle group at thigh level, right thigh, subsequent encounter: Secondary | ICD-10-CM | POA: Diagnosis not present

## 2023-02-25 DIAGNOSIS — S76311D Strain of muscle, fascia and tendon of the posterior muscle group at thigh level, right thigh, subsequent encounter: Secondary | ICD-10-CM | POA: Diagnosis not present

## 2023-02-25 DIAGNOSIS — R531 Weakness: Secondary | ICD-10-CM | POA: Diagnosis not present

## 2023-03-01 ENCOUNTER — Encounter: Payer: Self-pay | Admitting: Internal Medicine

## 2023-03-02 ENCOUNTER — Ambulatory Visit (HOSPITAL_COMMUNITY): Payer: Medicare Other | Attending: Internal Medicine

## 2023-03-02 DIAGNOSIS — R9431 Abnormal electrocardiogram [ECG] [EKG]: Secondary | ICD-10-CM | POA: Diagnosis not present

## 2023-03-02 DIAGNOSIS — Z952 Presence of prosthetic heart valve: Secondary | ICD-10-CM | POA: Insufficient documentation

## 2023-03-02 LAB — ECHOCARDIOGRAM COMPLETE
AR max vel: 4.17 cm2
AV Area VTI: 4 cm2
AV Area mean vel: 3.62 cm2
AV Mean grad: 8 mmHg
AV Peak grad: 14.6 mmHg
Ao pk vel: 1.91 m/s
Area-P 1/2: 3.21 cm2
S' Lateral: 2.8 cm

## 2023-03-10 ENCOUNTER — Telehealth (HOSPITAL_BASED_OUTPATIENT_CLINIC_OR_DEPARTMENT_OTHER): Payer: Self-pay | Admitting: *Deleted

## 2023-03-10 NOTE — Telephone Encounter (Signed)
Pre-operative Risk Assessment    Patient Name: Evan [name]  DOB: 01/15/1956 MRN: [id number]      Request for Surgical Clearance    Procedure:   Colonoscopy  Date of Surgery:  Clearance 04/09/23                                 Surgeon:  Dr. Kathi Der Surgeon's Group or Practice Name:  [facility] Phone number:  [phone] Fax number:  [phone]   Type of Clearance Requested:   - Medical  - Pharmacy:  Hold Aspirin Not Indicated.    Type of Anesthesia:   Propofol  Additional requests/questions:    Signed, Essiah Passariello   03/10/2023, 11:57 AM

## 2023-03-10 NOTE — Telephone Encounter (Signed)
     Primary Cardiologist: Parke Poisson, MD  Chart reviewed as part of pre-operative protocol coverage. Given past medical history and time since last visit, based on ACC/AHA guidelines, Evan [name] would be at acceptable risk for the planned procedure without further cardiovascular testing.   Patients aspirin is not prescribed by cardiology. Recommendations for holding aspirin will need to come from the prescribing provider.   I will route this recommendation to the requesting party via Epic fax function and remove from pre-op pool.  Please call with questions.  Evan [name]. Tito Ausmus NP-C     03/10/2023, 12:46 PM [facility] [address] Office [phone] Fax [phone]

## 2023-03-16 HISTORY — PX: COLONOSCOPY: SHX174

## 2023-03-21 ENCOUNTER — Encounter: Payer: Self-pay | Admitting: Internal Medicine

## 2023-03-21 DIAGNOSIS — I10 Essential (primary) hypertension: Secondary | ICD-10-CM

## 2023-03-25 ENCOUNTER — Ambulatory Visit: Payer: Medicare Other | Admitting: Dietician

## 2023-03-26 ENCOUNTER — Other Ambulatory Visit: Payer: Self-pay

## 2023-03-26 ENCOUNTER — Encounter: Payer: Self-pay | Admitting: Endocrinology

## 2023-03-26 DIAGNOSIS — E1169 Type 2 diabetes mellitus with other specified complication: Secondary | ICD-10-CM

## 2023-03-26 DIAGNOSIS — Z23 Encounter for immunization: Secondary | ICD-10-CM | POA: Diagnosis not present

## 2023-03-26 MED ORDER — DAPAGLIFLOZIN PROPANEDIOL 10 MG PO TABS: 10.0000 mg | ORAL_TABLET | Freq: Every day | ORAL | 2 refills | Status: DC

## 2023-04-02 ENCOUNTER — Other Ambulatory Visit (INDEPENDENT_AMBULATORY_CARE_PROVIDER_SITE_OTHER): Payer: Medicare Other

## 2023-04-02 DIAGNOSIS — E1169 Type 2 diabetes mellitus with other specified complication: Secondary | ICD-10-CM

## 2023-04-02 DIAGNOSIS — E669 Obesity, unspecified: Secondary | ICD-10-CM | POA: Diagnosis not present

## 2023-04-02 LAB — BASIC METABOLIC PANEL
BUN: 21 mg/dL (ref 6–23)
CO2: 27 meq/L (ref 19–32)
Calcium: 10.4 mg/dL (ref 8.4–10.5)
Chloride: 94 meq/L — ABNORMAL LOW (ref 96–112)
Creatinine, Ser: 1.03 mg/dL (ref 0.40–1.50)
GFR: 73.97 mL/min (ref >60.00)
Glucose, Bld: 132 mg/dL — ABNORMAL HIGH (ref 70–99)
Potassium: 4.8 meq/L (ref 3.5–5.1)
Sodium: 135 meq/L (ref 135–145)

## 2023-04-02 LAB — HEMOGLOBIN A1C: Hgb A1c MFr Bld: 6.3 % (ref 4.6–6.5)

## 2023-04-06 ENCOUNTER — Ambulatory Visit (INDEPENDENT_AMBULATORY_CARE_PROVIDER_SITE_OTHER): Payer: Medicare Other | Admitting: Endocrinology

## 2023-04-06 ENCOUNTER — Encounter: Payer: Self-pay | Admitting: Endocrinology

## 2023-04-06 VITALS — BP 160/70 | HR 84 | Resp 20 | Ht 71.0 in | Wt 243.6 lb

## 2023-04-06 DIAGNOSIS — Z7984 Long term (current) use of oral hypoglycemic drugs: Secondary | ICD-10-CM | POA: Diagnosis not present

## 2023-04-06 DIAGNOSIS — E119 Type 2 diabetes mellitus without complications: Secondary | ICD-10-CM | POA: Diagnosis not present

## 2023-04-06 DIAGNOSIS — Z7985 Long-term (current) use of injectable non-insulin antidiabetic drugs: Secondary | ICD-10-CM | POA: Diagnosis not present

## 2023-04-06 NOTE — Telephone Encounter (Signed)
He reports that he has not taken his BP in a few days/before yesterday = 165/72; today 160/80. He says he did check it a few days last week but he is not beside his "machine" to get the readings. I asked him to please send those readings when he gets a chance. He reports no symptoms at this time, just increased BP. Informed him that I would send this information to his provider. He verbalized understanding.

## 2023-04-06 NOTE — Progress Notes (Signed)
Outpatient Endocrinology Note [place] Vipul Cafarelli, MD  04/06/23  Patient's Name: Evan [name]    DOB: March 23, 1956    MRN: [id number]                                                    REASON OF VISIT: Follow-up for type 2 diabetes mellitus  PCP: Eustaquio Boyden, MD  HISTORY OF PRESENT ILLNESS:   Evan [name] is a 67 y.o. old male with past medical history listed below, is here for follow up of type 2 diabetes mellitus.   Pertinent Diabetes History: Patient was diagnosed with type 2 diabetes mellitus in 2012.  He has not been on insulin therapy.  He has controlled type 2 diabetes mellitus.  Chronic Diabetes Complications : Retinopathy: no. Last ophthalmology exam was done on annually reportedly. Nephropathy: no, on losartan. Peripheral neuropathy: no Coronary artery disease: no Stroke: no  Relevant comorbidities and cardiovascular risk factors: Obesity: yes Body mass index is 33.98 kg/m.  Hypertension: yes Hyperlipidemia. Yes, on statin.  Current / Home Diabetic regimen includes: Mounjaro 10 mg weekly. Metformin 1000 mg 2 times a day. Farxiga 10 mg daily.  Prior diabetic medications: Ozempic changed to [facility].  Glycemic data:   Glucometer Accu-Chek guide download reviewed.  From October 8 to April 06, 2023, average blood sugar 133.  Some of the blood sugar 145, 161, 126, 143, 119, 120, 123, 124.  He checked mostly in the morning and sometime in the afternoon.  All the blood sugar are acceptable.   Hypoglycemia: Patient has no hypoglycemic episodes. Patient has hypoglycemia awareness.  Factors modifying glucose control: 1.  Diabetic diet assessment: 2-3 meals a day.  2.  Staying active or exercising:   3.  Medication compliance: compliant all of the time.  Interval history 04/06/23 Glucometer data as reviewed above.  He has been on Mounjaro 10 mg weekly for the last 2 weeks.  Prior to that he was taking 5 mg weekly.  Denies any GI upset, nausea or vomiting.   Tolerating well.  Recent hemoglobin A1c 6.3%.  No numbness and tingling of the feet.  No vision problem.  No other complaints today.  She is going to have colonoscopy screening in few days.  REVIEW OF SYSTEMS As per history of present illness.   PAST MEDICAL HISTORY: Past Medical History:  Diagnosis Date   Adrenal adenoma    Complication of anesthesia    difficulty to weak up   COVID-19 virus infection 05/2020   Depression    intolerant of cymbalta and wellbutrin   Diverticulosis of colon    Esophagitis 03/2015   by EGD   Gastritis 03/2015   by EGD - mild, chronic   Hyperlipidemia    Hypertension    Left ventricular hypertrophy    Lichen simplex chronicus 05/2015   R scrotum Terri Piedra)   Liver lesion    2022 CTs: There was an incidental finding of a hypervascular lesion in the right lobe of the liver as well as left adrenal nodule. He underwent MRI of the abdomen showing the appearance of the liver favored focal nodular hyperplasia, as well as small left adrenal adenoma.   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 Clifton James & Dr. Laneta Simmers    Severe aortic stenosis    T2DM (type 2 diabetes mellitus) (HCC)    DSME 09/2013    PAST SURGICAL HISTORY: Past Surgical History:  Procedure Laterality Date   COLONOSCOPY  2006   diverticulosis per pt   COLONOSCOPY  03/2015   2 polyps, rpt 5 yrs Madilyn Fireman)   ESOPHAGOGASTRODUODENOSCOPY  03/2015   mild chronic gastritis, esophagitis    INTRAOPERATIVE TRANSTHORACIC ECHOCARDIOGRAM Left 10/29/2020   Procedure: INTRAOPERATIVE TRANSTHORACIC ECHOCARDIOGRAM;  Surgeon: Kathleene Hazel, MD;  Location: [facility] OR;  Service: Open Heart Surgery;  Laterality: Left;   LUMBAR LAMINECTOMY  1989   L5-S1   RIGHT/LEFT HEART CATH AND CORONARY ANGIOGRAPHY N/A  08/15/2020   Procedure: RIGHT/LEFT HEART CATH AND CORONARY ANGIOGRAPHY;  Surgeon: Kathleene Hazel, MD;  Location: [facility] INVASIVE CV LAB;  Service: Cardiovascular;  Laterality: N/A;   TRANSCATHETER AORTIC VALVE REPLACEMENT, TRANSFEMORAL Bilateral 10/29/2020   Procedure: TRANSCATHETER AORTIC VALVE REPLACEMENT, TRANSFEMORAL;  Surgeon: Kathleene Hazel, MD;  Location: [facility] OR;  Service: Open Heart Surgery;  Laterality: Bilateral;   ULTRASOUND GUIDANCE FOR VASCULAR ACCESS Bilateral 10/29/2020   Procedure: ULTRASOUND GUIDANCE FOR VASCULAR ACCESS;  Surgeon: Kathleene Hazel, MD;  Location: [facility] OR;  Service: Open Heart Surgery;  Laterality: Bilateral;    ALLERGIES: Allergies  Allergen Reactions   Penicillins     Respiratory distress as infant    FAMILY HISTORY:  Family History  Problem Relation Age of Onset   Alzheimer's disease Father    Hypertension Father    Stroke Father    Hyperlipidemia Father    Prostate cancer Brother    Diabetes Brother    Alcohol abuse Brother    CAD Brother    Coronary artery disease Maternal Uncle    Schizophrenia Paternal Uncle    Emphysema Mother    Alcohol abuse Mother    Stroke Maternal Grandmother    Stroke Maternal Grandfather    Diabetes Maternal Grandfather    Stroke Paternal Grandmother    Stroke Paternal Grandfather     SOCIAL HISTORY: Social History   Socioeconomic History   Marital status: Married    Spouse name: Not on file   Number of children: 2   Years of education: Not on file   Highest education level: Not on file  Occupational History   Occupation: Retired-Worked for the [employer]  Tobacco Use   Smoking status: Never   Smokeless tobacco: Never  Substance and Sexual Activity   Alcohol use: Not Currently    Alcohol/week: 0.0 standard drinks of alcohol    Comment: occasional   Drug use: No   Sexual activity: Not on file  Other Topics Concern   Not on file  Social History Narrative   Caffeine: 3 cups  coffee/day   Lives with wife and daughter, son in college, 2 cats and 1 dog   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  Keeps record of caloric intake   Social Determinants of Health   Financial Resource Strain: Low Risk  (07/14/2021)   Overall Financial Resource Strain (CARDIA)    Difficulty of Paying Living Expenses: Not hard at all  Food Insecurity: No Food Insecurity (07/14/2021)   Hunger Vital Sign    Worried About Running Out of Food in the Last Year:  Never true    Ran Out of Food in the Last Year: Never true  Transportation Needs: No Transportation Needs (12/25/2020)   PRAPARE - Administrator, Civil Service (Medical): No    Lack of Transportation (Non-Medical): No  Physical Activity: Sufficiently Active (12/25/2020)   Exercise Vital Sign    Days of Exercise per Week: 7 days    Minutes of Exercise per Session: 60 min  Stress: No Stress Concern Present (12/25/2020)   [employer] of Occupational Health - Occupational Stress Questionnaire    Feeling of Stress : Not at all  Social Connections: Not on file    MEDICATIONS:  Current Outpatient Medications  Medication Sig Dispense Refill   amLODipine (NORVASC) 10 MG tablet Take 1 tablet (10 mg total) by mouth daily. 90 tablet 3   aspirin EC 81 MG tablet Take 81 mg by mouth every evening. Swallow whole.     Blood Glucose Monitoring Suppl (ONETOUCH VERIO FLEX SYSTEM) w/Device KIT 1 kit by Does not apply route daily. Use to check blood sugars 2 times daily 1 kit 2   cephALEXin (KEFLEX) 500 MG capsule TAKE 4 CAPSULES BY MOUTH 60 MINUTES PRIOR TO ANY DENTAL APPOINTMENTS 12 capsule 2   Cyanocobalamin (VITAMIN B-12) 2500 MCG SUBL Take 2,500 mcg by mouth daily.     dapagliflozin propanediol (FARXIGA) 10 MG TABS tablet Take 1 tablet (10 mg total) by mouth daily. 30 tablet 2   fluticasone (FLONASE) 50 MCG/ACT nasal spray  Place 1 spray into both nostrils daily as needed for allergies or rhinitis.     glucose blood (ACCU-CHEK GUIDE) test strip Check blood sugar 2 times daily 100 each 12   Lancets (ACCU-CHEK MULTICLIX) lancets Check blood sugar 2 times daily 100 each 12   losartan (COZAAR) 50 MG tablet Take 1 tablet (50 mg total) by mouth daily. 90 tablet 4   Magnesium 250 MG TABS Take 250 mg by mouth every evening.     metFORMIN (GLUCOPHAGE) 1000 MG tablet Take 2 tablet daily 180 tablet 2   omeprazole (PRILOSEC) 20 MG capsule Take 1 capsule (20 mg total) by mouth daily.     rosuvastatin (CRESTOR) 5 MG tablet Take 1 tablet (5 mg total) by mouth daily. 90 tablet 4   sildenafil (REVATIO) 20 MG tablet TAKE 5 TABLETS BY MOUTH ONCE DAILY AS NEEDED 30 tablet 5   tadalafil (CIALIS) 5 MG tablet Take 1 tablet (5 mg total) by mouth daily. 30 tablet 11   tirzepatide (MOUNJARO) 10 MG/0.5ML Pen Inject 10 mg into the skin once a week. 2 mL 1   TURMERIC PO Take 1,800 mg by mouth every evening.     vitamin C (ASCORBIC ACID) 500 MG tablet Take 500 mg by mouth daily.     Vitamin D-Vitamin K (K2 PLUS D3 PO) Take 1 tablet by mouth every evening.     Zinc 50 MG TABS Take 50 mg by mouth every evening.     tirzepatide [facility]) 5 MG/0.5ML Pen Inject 5 mg into the skin once a week. (Patient not taking: Reported on 02/09/2023) 2 mL 1   No current facility-administered medications for this visit.    PHYSICAL EXAM: Vitals:   04/06/23 0912  BP: (!) 160/70  Pulse: 84  Resp: 20  SpO2: 98%  Weight: 243 lb 9.6 oz (110.5 kg)  Height: 5\' 11"  (1.803 m)   Body mass index is 33.98 kg/m.  Wt Readings from Last 3 Encounters:  04/06/23 243 lb 9.6  oz (110.5 kg)  02/09/23 248 lb 6.4 oz (112.7 kg)  02/02/23 248 lb 3.2 oz (112.6 kg)    General: Well developed, well nourished male in no apparent distress.  HEENT: AT/[place], no external lesions.  Eyes: Conjunctiva clear and no icterus. Neck: Neck supple  Lungs: Respirations not  labored Neurologic: Alert, oriented, normal speech Extremities / Skin: Dry. No sores or rashes noted.  Psychiatric: Does not appear depressed or anxious  Diabetic Foot Exam - Simple   No data filed    LABS Reviewed Lab Results  Component Value Date   HGBA1C 6.3 04/02/2023   HGBA1C 6.5 12/28/2022   HGBA1C 6.3 09/29/2022   Lab Results  Component Value Date   FRUCTOSAMINE 272 12/25/2020   FRUCTOSAMINE 224 12/19/2019   FRUCTOSAMINE 239 12/13/2017   Lab Results  Component Value Date   CHOL 161 12/28/2022   HDL 76.70 12/28/2022   LDLCALC 68 12/28/2022   LDLDIRECT 89.0 12/23/2021   TRIG 82.0 12/28/2022   CHOLHDL 2 12/28/2022   Lab Results  Component Value Date   MICRALBCREAT 1.2 09/29/2022   MICRALBCREAT 1.5 07/29/2021   Lab Results  Component Value Date   CREATININE 1.03 04/02/2023   Lab Results  Component Value Date   GFR 73.97 04/02/2023    ASSESSMENT / PLAN  1. Controlled type 2 diabetes mellitus without complication, without long-term current use of insulin (HCC)     Diabetes Mellitus type 2, complicated by no known complications. - Diabetic status / severity: Controlled.  Lab Results  Component Value Date   HGBA1C 6.3 04/02/2023    - Hemoglobin A1c goal : <7%  - Medications: No change.  I) Mounjaro 10 mg weekly. II) metformin 1000 mg 2 times a day. III) Farxiga 10 mg daily.  Will consider increasing Mounjaro in the next follow-up visit if the weight loss is not effective.  - Home glucose testing: Check blood sugar in the morning fasting and occasionally at bedtime. - Discussed/ Gave Hypoglycemia treatment plan.  # Consult : not required at this time.   # Annual urine for microalbuminuria/ creatinine ratio, no microalbuminuria currently, continue ACE/ARB /losartan. Last  Lab Results  Component Value Date   MICRALBCREAT 1.2 09/29/2022    # Foot check nightly / neuropathy.  # Annual dilated diabetic eye exams.   - Diet: Make healthy  diabetic food choices - Life style / activity / exercise: discussed.   2. Blood pressure  -  BP Readings from Last 1 Encounters:  04/06/23 (!) 160/70    - Control is not in target.  - No change in current plans.Asked to discussed with PCP.   3. Lipid status / Hyperlipidemia - Last  Lab Results  Component Value Date   LDLCALC 68 12/28/2022   - Continue crestor 5mg  daily.   Diagnoses and all orders for this visit:  Controlled type 2 diabetes mellitus without complication, without long-term current use of insulin (HCC)    DISPOSITION Follow up in clinic in 6 months suggested.   All questions answered and patient verbalized understanding of the plan.  [place] Amarrah Meinhart, MD [facility] [address] [place], [address] [place], [place] [address] Phone # [phone]  At least part of this note was generated using voice recognition software. Inadvertent word errors may have occurred, which were not recognized during the proofreading process.

## 2023-04-09 DIAGNOSIS — K5730 Diverticulosis of large intestine without perforation or abscess without bleeding: Secondary | ICD-10-CM | POA: Diagnosis not present

## 2023-04-09 DIAGNOSIS — Z09 Encounter for follow-up examination after completed treatment for conditions other than malignant neoplasm: Secondary | ICD-10-CM | POA: Diagnosis not present

## 2023-04-09 DIAGNOSIS — K648 Other hemorrhoids: Secondary | ICD-10-CM | POA: Diagnosis not present

## 2023-04-09 DIAGNOSIS — Z860101 Personal history of adenomatous and serrated colon polyps: Secondary | ICD-10-CM | POA: Diagnosis not present

## 2023-04-09 DIAGNOSIS — D127 Benign neoplasm of rectosigmoid junction: Secondary | ICD-10-CM | POA: Diagnosis not present

## 2023-04-09 LAB — HM COLONOSCOPY

## 2023-04-13 DIAGNOSIS — D127 Benign neoplasm of rectosigmoid junction: Secondary | ICD-10-CM | POA: Diagnosis not present

## 2023-04-15 MED ORDER — LOSARTAN POTASSIUM 50 MG PO TABS: 75.0000 mg | ORAL_TABLET | Freq: Every day | ORAL | 4 refills | Status: DC

## 2023-04-15 NOTE — Addendum Note (Signed)
Addended by: Bernita Buffy on: 04/15/2023 11:27 AM   Modules accepted: Orders

## 2023-04-26 ENCOUNTER — Telehealth: Payer: Self-pay | Admitting: Family Medicine

## 2023-04-26 ENCOUNTER — Encounter: Payer: Self-pay | Admitting: Family Medicine

## 2023-04-26 NOTE — Telephone Encounter (Signed)
Opened in error

## 2023-05-04 ENCOUNTER — Other Ambulatory Visit: Payer: Self-pay

## 2023-05-04 MED ORDER — TIRZEPATIDE 10 MG/0.5ML ~~LOC~~ SOAJ: 10.0000 mg | SUBCUTANEOUS | 1 refills | Status: DC

## 2023-05-10 NOTE — Telephone Encounter (Signed)
Spoke to pt regarding increasing Losartan to 75 mg once per day, and then having labs drawn 7-10 days after starting the new dose.  Pt states he has only been on 50 mg daily and his BP remains elevated.  Pt verbalized understanding of increasing the dose and having labs drawn around 05/21/2023.

## 2023-05-21 DIAGNOSIS — I10 Essential (primary) hypertension: Secondary | ICD-10-CM | POA: Diagnosis not present

## 2023-05-22 LAB — BASIC METABOLIC PANEL
BUN/Creatinine Ratio: 23 (ref 10–24)
BUN: 23 mg/dL (ref 8–27)
CO2: 27 mmol/L (ref 20–29)
Calcium: 9.7 mg/dL (ref 8.6–10.2)
Chloride: 100 mmol/L (ref 96–106)
Creatinine, Ser: 0.98 mg/dL (ref 0.76–1.27)
Glucose: 120 mg/dL — ABNORMAL HIGH (ref 70–99)
Potassium: 4.9 mmol/L (ref 3.5–5.2)
Sodium: 140 mmol/L (ref 134–144)
eGFR: 83 mL/min/{1.73_m2} (ref >59)

## 2023-05-28 ENCOUNTER — Encounter: Payer: Medicare Other | Attending: Family Medicine | Admitting: Dietician

## 2023-05-28 ENCOUNTER — Encounter: Payer: Self-pay | Admitting: Dietician

## 2023-05-28 DIAGNOSIS — E1169 Type 2 diabetes mellitus with other specified complication: Secondary | ICD-10-CM | POA: Diagnosis not present

## 2023-05-28 NOTE — Progress Notes (Signed)
Diabetes Self-Management Education  Visit Type: First/Initial  Appt. Start Time: 0845 Appt. End Time: 0920  05/28/2023  [name], identified by name and date of birth, is a 67 y.o. male with a diagnosis of Diabetes: Type 2.   ASSESSMENT  Primary concern: weight loss for diabetes management  History includes: depression, type 2 diabetes, HLD, HTN, artificial heart valve Labs noted: 04/02/23 A1c 6.3 Medications include: farxiga, metformin, mounjaro Supplements: magnesium, zinc, vitamin d+k, vitamin b12, turmeric, vitamin C, psyllium husk  Pt states his fasting blood sugar is between 100-120 mg/dL. Pt states he eats between 12PM-8PM, and has been intermittently fasting for a couple years.  Pt states around 5 years ago he weighed about 315 lbs. Pt states around this time he retired and started intentionally trying to lose weight. Pt states he started limiting his carbohydrate intake and exercising regularly. Pt states he maintains walking 5-6 miles 5-6 times per week and strength trains (weights & push-ups). 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  Pt declined taking his weight today, but reports he is around 245 lbs. Pt states he has slightly high blood pressure.  Pt states his sleep is decent and he typically gets around 7 hours nightly, but reports waking around 3AM.   Pt reports he has had a sleep study for sleep apnea and says he has "borderline sleep apnea".  Pt plans to work on incorporating a protein forward meal for breakfast and aiming to follow the Plate Method for lunch and dinner meals, incorporating a source of complex carbohydrates, protein and 1/2 plate non-starchy vegetables.  There were no vitals taken for this visit. There is no height or weight on file to calculate BMI.   Diabetes Self-Management Education - 05/28/23 5956       Visit Information   Visit Type First/Initial      Initial Visit   Diabetes Type Type 2    Date  Diagnosed 2010    Are you taking your medications as prescribed? Yes      Health Coping   How would you rate your overall health? Good      Psychosocial Assessment   Patient Belief/Attitude about Diabetes Motivated to manage diabetes    What is the hardest part about your diabetes right now, causing you the most concern, or is the most worrisome to you about your diabetes?   Other (comment)   losing weight   Self-care barriers None    Self-management support Doctor's office;Family    Other persons present Patient    Patient Concerns Healthy Lifestyle;Weight Control    Special Needs None    Preferred Learning Style No preference indicated    Learning Readiness Ready    How often do you need to have someone help you when you read instructions, pamphlets, or other written materials from your doctor or pharmacy? 1 - Never    What is the last grade level you completed in school? graduate school      Pre-Education Assessment   Patient understands the diabetes disease and treatment process. Needs Instruction    Patient understands incorporating nutritional management into lifestyle. Needs Instruction    Patient undertands incorporating physical activity into lifestyle. Needs Instruction    Patient understands using medications safely. Needs Instruction    Patient understands monitoring blood glucose, interpreting and using results Needs Instruction    Patient understands prevention, detection, and treatment of acute complications. Needs Instruction    Patient understands prevention, detection, and  treatment of chronic complications. Needs Instruction    Patient understands how to develop strategies to address psychosocial issues. Needs Instruction    Patient understands how to develop strategies to promote health/change behavior. Needs Instruction      Complications   Last HgB A1C per patient/outside source 6.3 %    How often do you check your blood sugar? 1-2 times/day    Fasting Blood  glucose range (mg/dL) 82-956    Postprandial Blood glucose range (mg/dL) 213-086;578-469    Have you had a dilated eye exam in the past 12 months? Yes    Have you had a dental exam in the past 12 months? Yes    Are you checking your feet? No      Dietary Intake   Breakfast none    Snack (morning) none    Lunch protein shake, 2 eggs, greek yogurt    Snack (afternoon) protein bar    Dinner pork chop (breaded), sour kraut, green beans and carrot    Snack (evening) sometimes yogurt or protein bar    Beverage(s) coffee 3 cups, 34 oz water, sweet tea with stevia      Activity / Exercise   Activity / Exercise Type Moderate (swimming / aerobic walking)    How many days per week do you exercise? 5    How many minutes per day do you exercise? 120    Total minutes per week of exercise 600      Patient Education   Previous Diabetes Education Yes (please comment)   NDES   Disease Pathophysiology Factors that contribute to the development of diabetes;Explored patient's options for treatment of their diabetes    Healthy Eating Role of diet in the treatment of diabetes and the relationship between the three main macronutrients and blood glucose level;Plate Method;Carbohydrate counting    Being Active Role of exercise on diabetes management, blood pressure control and cardiac health.    Medications Reviewed patients medication for diabetes, action, purpose, timing of dose and side effects.    Monitoring Interpreting lab values - A1C, lipid, urine microalbumina.;Identified appropriate SMBG and/or A1C goals.    Chronic complications Identified and discussed with patient  current chronic complications;Reviewed with patient heart disease, higher risk of, and prevention    Diabetes Stress and Support Identified and addressed patients feelings and concerns about diabetes;Role of stress on diabetes    Lifestyle and Health Coping Lifestyle issues that need to be addressed for better diabetes care       Individualized Goals (developed by patient)   Nutrition General guidelines for healthy choices and portions discussed    Physical Activity Exercise 5-7 days per week;60 minutes per day    Medications take my medication as prescribed    Monitoring  Test my blood glucose as discussed    Problem Solving Sleep Pattern;Eating Pattern    Reducing Risk do foot checks daily;treat hypoglycemia with 15 grams of carbs if blood glucose less than 70mg /dL;examine blood glucose patterns    Health Coping Ask for help with psychological, social, or emotional issues      Post-Education Assessment   Patient understands the diabetes disease and treatment process. Comprehends key points    Patient understands incorporating nutritional management into lifestyle. Comprehends key points    Patient undertands incorporating physical activity into lifestyle. Demonstrates understanding / competency    Patient understands using medications safely. Demonstrates understanding / competency    Patient understands monitoring blood glucose, interpreting and using results Comprehends key points  Patient understands prevention, detection, and treatment of acute complications. Needs Review    Patient understands prevention, detection, and treatment of chronic complications. Needs Review    Patient understands how to develop strategies to address psychosocial issues. Comprehends key points    Patient understands how to develop strategies to promote health/change behavior. Comprehends key points      Outcomes   Expected Outcomes Demonstrated interest in learning. Expect positive outcomes    Future DMSE 2 months    Program Status Not Completed             Individualized Plan for Diabetes Self-Management Training:   Learning Objective:  Patient will have a greater understanding of diabetes self-management. Patient education plan is to attend individual and/or group sessions per assessed needs and concerns.   Plan:    There are no Patient Instructions on file for this visit.  Expected Outcomes:  Demonstrated interest in learning. Expect positive outcomes  Education material provided: ADA - How to Thrive: A Guide for Your Journey with Diabetes, My Plate, and Snack sheet  If problems or questions, patient to contact team via:  Phone  Future DSME appointment: 2 months

## 2023-06-01 ENCOUNTER — Encounter: Payer: Self-pay | Admitting: Endocrinology

## 2023-06-01 DIAGNOSIS — H5203 Hypermetropia, bilateral: Secondary | ICD-10-CM | POA: Diagnosis not present

## 2023-06-01 DIAGNOSIS — E119 Type 2 diabetes mellitus without complications: Secondary | ICD-10-CM | POA: Diagnosis not present

## 2023-06-01 DIAGNOSIS — H4322 Crystalline deposits in vitreous body, left eye: Secondary | ICD-10-CM | POA: Diagnosis not present

## 2023-06-01 DIAGNOSIS — H2513 Age-related nuclear cataract, bilateral: Secondary | ICD-10-CM | POA: Diagnosis not present

## 2023-06-01 DIAGNOSIS — H524 Presbyopia: Secondary | ICD-10-CM | POA: Diagnosis not present

## 2023-06-01 LAB — HM DIABETES EYE EXAM

## 2023-06-03 ENCOUNTER — Encounter: Payer: Self-pay | Admitting: Endocrinology

## 2023-06-30 ENCOUNTER — Other Ambulatory Visit: Payer: Self-pay | Admitting: Endocrinology

## 2023-07-03 ENCOUNTER — Encounter: Payer: Self-pay | Admitting: Endocrinology

## 2023-07-03 DIAGNOSIS — E1169 Type 2 diabetes mellitus with other specified complication: Secondary | ICD-10-CM

## 2023-07-07 ENCOUNTER — Encounter: Payer: Self-pay | Admitting: Family Medicine

## 2023-07-07 ENCOUNTER — Other Ambulatory Visit: Payer: Self-pay | Admitting: Endocrinology

## 2023-07-07 ENCOUNTER — Ambulatory Visit (INDEPENDENT_AMBULATORY_CARE_PROVIDER_SITE_OTHER): Payer: Medicare Other | Admitting: Family Medicine

## 2023-07-07 VITALS — BP 146/80 | HR 74 | Temp 97.9°F | Ht 71.0 in | Wt 241.2 lb

## 2023-07-07 DIAGNOSIS — I10 Essential (primary) hypertension: Secondary | ICD-10-CM

## 2023-07-07 DIAGNOSIS — E1169 Type 2 diabetes mellitus with other specified complication: Secondary | ICD-10-CM | POA: Diagnosis not present

## 2023-07-07 LAB — POCT GLYCOSYLATED HEMOGLOBIN (HGB A1C): Hemoglobin A1C: 6.6 % — AB (ref 4.0–5.6)

## 2023-07-07 MED ORDER — LOSARTAN POTASSIUM 100 MG PO TABS: 100.0000 mg | ORAL_TABLET | Freq: Every day | ORAL | 3 refills | Status: DC

## 2023-07-07 MED ORDER — TIRZEPATIDE 12.5 MG/0.5ML ~~LOC~~ SOAJ: 12.5000 mg | SUBCUTANEOUS | 4 refills | Status: DC

## 2023-07-07 NOTE — Assessment & Plan Note (Signed)
Chronic, stable period followed by endo Dr Erroll Luna - continue current regimen.

## 2023-07-07 NOTE — Assessment & Plan Note (Addendum)
This is associated with hypertension and diabetes

## 2023-07-07 NOTE — Assessment & Plan Note (Addendum)
Chronic, BP remaining above goal. Reviewed low salt/sodium diet, DASH diet handout provided. Will continue titration of losartan to full 100mg  nightly I did ask him to return in 7-10 days for BMP lab

## 2023-07-07 NOTE — Telephone Encounter (Signed)
 I sent prescription for Mounjaro 12.5 mg weekly.

## 2023-07-07 NOTE — Progress Notes (Signed)
Ph: [phone] Fax: [phone]   Patient ID: Evan [name], male    DOB: 1956-02-15, 68 y.o.   MRN: [id number]  This visit was conducted in person.  BP (!) 146/80 (BP Location: Right Arm, Cuff Size: Large)   Pulse 74   Temp 97.9 F (36.6 C) (Oral)   Ht 5\' 11"  (1.803 m)   Wt 241 lb 4 oz (109.4 kg)   SpO2 96%   BMI 33.65 kg/m   BP Readings from Last 3 Encounters:  07/07/23 (!) 146/80  04/06/23 (!) 160/70  02/09/23 136/70    CC: 6 mo f/u visit  Subjective:   HPI: Evan [name] is a 68 y.o. male presenting on 07/07/2023 for Medical Management of Chronic Issues (Here for 6 mo f/u.)   Severe aortic stenosis s/p TAVR 10/2020, bicuspid aortic valve followed by cardiology Jacques Navy). S/p catheterization 08/2020 without significant CAD.   HTN - Compliant with current antihypertensive regimen of amlodipine 10mg  daily, losartan 75mg  daily. Does check blood pressures at home: 150s systolic. No low blood pressure readings or symptoms of dizziness/syncope. Denies HA, vision changes, CP/tightness, SOB, leg swelling. Already limits salt/sodium in diet.   He follows Ketodiet, sees dietician.   DM - does regularly check sugars daily. Regularly sees endo - Kumar --> [facility]. Compliant with antihyperglycemic regimen which includes: farxiga 10mg  daily, metformin 1000mg  2 nightly, mounjaro 10mg  weekly. Denies low sugars or hypoglycemic symptoms. Denies paresthesias, blurry vision. Last diabetic eye exam 05/2023. Glucometer brand: accuchek guide. Last foot exam: 01/2022 - DUE. DSME: saw nutritionist 2018 and again 05/2023. Lab Results  Component Value Date   HGBA1C 6.6 (A) 07/07/2023   Diabetic Foot Exam - Simple   Simple Foot Form Diabetic Foot exam was performed with the following findings: Yes 07/07/2023  9:17 AM  Visual Inspection No deformities, no ulcerations, no other skin breakdown bilaterally: Yes Sensation Testing Intact to touch and monofilament testing bilaterally: Yes Pulse  Check Posterior Tibialis and Dorsalis pulse intact bilaterally: Yes Comments No claudication 2 + DP pulses    Lab Results  Component Value Date   MICROALBUR 1.4 09/29/2022         Relevant past medical, surgical, family and social history reviewed and updated as indicated. Interim medical history since our last visit reviewed. Allergies and medications reviewed and updated. Outpatient Medications Prior to Visit  Medication Sig Dispense Refill   aspirin EC 81 MG tablet Take 81 mg by mouth every evening. Swallow whole.     cephALEXin (KEFLEX) 500 MG capsule TAKE 4 CAPSULES BY MOUTH 60 MINUTES PRIOR TO ANY DENTAL APPOINTMENTS 12 capsule 2   Cyanocobalamin (VITAMIN B-12) 2500 MCG SUBL Take 2,500 mcg by mouth daily.     dapagliflozin propanediol (FARXIGA) 10 MG TABS tablet Take 1 tablet (10 mg total) by mouth daily. 30 tablet 2   fluticasone (FLONASE) 50 MCG/ACT nasal spray Place 1 spray into both nostrils daily as needed for allergies or rhinitis.     glucose blood (ACCU-CHEK GUIDE) test strip Check blood sugar 2 times daily 100 each 12   Lancets (ACCU-CHEK MULTICLIX) lancets Check blood sugar 2 times daily 100 each 12   Magnesium 250 MG TABS Take 250 mg by mouth every evening.     omeprazole (PRILOSEC) 20 MG capsule Take 1 capsule (20 mg total) by mouth daily.     rosuvastatin (CRESTOR) 5 MG tablet Take 1 tablet (5 mg total) by mouth daily. 90 tablet 4   sildenafil (REVATIO) 20 MG  tablet TAKE 5 TABLETS BY MOUTH ONCE DAILY AS NEEDED 30 tablet 5   tadalafil (CIALIS) 5 MG tablet Take 1 tablet (5 mg total) by mouth daily. 30 tablet 11   tirzepatide (MOUNJARO) 10 MG/0.5ML Pen INJECT 1/2 (ONE-HALF) ML SUBCUTANEOUSLY  ONCE A WEEK 6 mL 3   TURMERIC PO Take 1,800 mg by mouth every evening.     vitamin C (ASCORBIC ACID) 500 MG tablet Take 500 mg by mouth daily.     Vitamin D-Vitamin K (K2 PLUS D3 PO) Take 1 tablet by mouth every evening.     Zinc 50 MG TABS Take 50 mg by mouth every evening.      losartan (COZAAR) 50 MG tablet Take 1.5 tablets (75 mg total) by mouth daily. 45 tablet 4   metFORMIN (GLUCOPHAGE) 1000 MG tablet Take 2 tablet daily 180 tablet 2   amLODipine (NORVASC) 10 MG tablet Take 1 tablet (10 mg total) by mouth daily. 90 tablet 3   metFORMIN (GLUCOPHAGE) 1000 MG tablet Take 2 tablets (2,000 mg total) by mouth at bedtime.     Blood Glucose Monitoring Suppl (ONETOUCH VERIO FLEX SYSTEM) w/Device KIT 1 kit by Does not apply route daily. Use to check blood sugars 2 times daily 1 kit 2   No facility-administered medications prior to visit.     Per HPI unless specifically indicated in ROS section below Review of Systems  Objective:  BP (!) 146/80 (BP Location: Right Arm, Cuff Size: Large)   Pulse 74   Temp 97.9 F (36.6 C) (Oral)   Ht 5\' 11"  (1.803 m)   Wt 241 lb 4 oz (109.4 kg)   SpO2 96%   BMI 33.65 kg/m   Wt Readings from Last 3 Encounters:  07/07/23 241 lb 4 oz (109.4 kg)  04/06/23 243 lb 9.6 oz (110.5 kg)  02/09/23 248 lb 6.4 oz (112.7 kg)      Physical Exam Vitals and nursing note reviewed.  Constitutional:      Appearance: Normal appearance. He is not ill-appearing.  HENT:     Head: Normocephalic and atraumatic.     Mouth/Throat:     Mouth: Mucous membranes are moist.     Pharynx: Oropharynx is clear. No oropharyngeal exudate or posterior oropharyngeal erythema.  Eyes:     Extraocular Movements: Extraocular movements intact.     Conjunctiva/sclera: Conjunctivae normal.     Pupils: Pupils are equal, round, and reactive to light.  Cardiovascular:     Rate and Rhythm: Normal rate and regular rhythm.     Pulses: Normal pulses.     Heart sounds: Murmur (2/6 systolic) heard.  Pulmonary:     Effort: Pulmonary effort is normal. No respiratory distress.     Breath sounds: Normal breath sounds. No wheezing, rhonchi or rales.  Musculoskeletal:     Right lower leg: No edema.     Left lower leg: No edema.     Comments: See HPI for foot exam if done   Skin:    General: Skin is warm and dry.     Findings: No rash.  Neurological:     Mental Status: He is alert.  Psychiatric:        Mood and Affect: Mood normal.        Behavior: Behavior normal.       Results for orders placed or performed in visit on 07/07/23  POCT glycosylated hemoglobin (Hb A1C)   Collection Time: 07/07/23  9:03 AM  Result Value Ref Range  Hemoglobin A1C 6.6 (A) 4.0 - 5.6 %   HbA1c POC (<> result, manual entry)     HbA1c, POC (prediabetic range)     HbA1c, POC (controlled diabetic range)     Lab Results  Component Value Date   ALT 17 12/28/2022   AST 15 12/28/2022   ALKPHOS 37 (L) 12/28/2022   BILITOT 0.5 12/28/2022    Lab Results  Component Value Date   NA 140 05/21/2023   CL 100 05/21/2023   K 4.9 05/21/2023   CO2 27 05/21/2023   BUN 23 05/21/2023   CREATININE 0.98 05/21/2023   EGFR 83 05/21/2023   CALCIUM 9.7 05/21/2023   ALBUMIN 4.5 12/28/2022   GLUCOSE 120 (H) 05/21/2023    Assessment & Plan:   Problem List Items Addressed This Visit     Type 2 diabetes mellitus with other specified complication (HCC)   Chronic, stable period followed by endo Dr Erroll Luna - continue current regimen.       Relevant Medications   losartan (COZAAR) 100 MG tablet   metFORMIN (GLUCOPHAGE) 1000 MG tablet   Other Relevant Orders   POCT glycosylated hemoglobin (Hb A1C) (Completed)   Essential hypertension - Primary   Chronic, BP remaining above goal. Reviewed low salt/sodium diet, DASH diet handout provided. Will continue titration of losartan to full 100mg  nightly I did ask him to return in 7-10 days for BMP lab      Relevant Medications   losartan (COZAAR) 100 MG tablet   Other Relevant Orders   Basic metabolic panel   Severe obesity (BMI 35.0-39.9) with comorbidity (HCC)   This is associated with hypertension and diabetes      Relevant Medications   metFORMIN (GLUCOPHAGE) 1000 MG tablet     Meds ordered this encounter  Medications   losartan  (COZAAR) 100 MG tablet    Sig: Take 1 tablet (100 mg total) by mouth daily.    Dispense:  90 tablet    Refill:  3    Note new dose    Orders Placed This Encounter  Procedures   Basic metabolic panel    Standing Status:   Future    Expiration Date:   07/06/2024   POCT glycosylated hemoglobin (Hb A1C)    Patient Instructions  Increase losartan to 100mg  daily Goal BP <140/90.  Work on low salt/sodium diet - goal <2 grams (2,000mg ) per day.  Eat a diet high in fruits/vegetables and whole grains. Look into mediterranean and DASH diet. Goal activity is 170min/wk of moderate intensity exercise. This can be split into 30 minute chunks.  Look at www.heart.org for more resources   Follow up plan: Return in about 6 months (around 01/04/2024) for annual exam, prior fasting for blood work.  Eustaquio Boyden, MD

## 2023-07-07 NOTE — Assessment & Plan Note (Deleted)
BMI >35 with associated diabetes and hypertension.

## 2023-07-07 NOTE — Patient Instructions (Addendum)
Increase losartan to 100mg  daily Goal BP <140/90.  Work on low salt/sodium diet - goal <2 grams (2,000mg ) per day.  Eat a diet high in fruits/vegetables and whole grains. Look into mediterranean and DASH diet. Goal activity is 147min/wk of moderate intensity exercise. This can be split into 30 minute chunks.  Look at www.heart.org for more resources

## 2023-07-08 ENCOUNTER — Other Ambulatory Visit: Payer: Self-pay

## 2023-07-08 DIAGNOSIS — E1169 Type 2 diabetes mellitus with other specified complication: Secondary | ICD-10-CM

## 2023-07-08 MED ORDER — TIRZEPATIDE 12.5 MG/0.5ML ~~LOC~~ SOAJ: 12.5000 mg | SUBCUTANEOUS | 4 refills | Status: DC

## 2023-07-08 NOTE — Telephone Encounter (Signed)
Pt called stating he needed his Mounjaro to be re-sent to [facility].   Requested Prescriptions   Signed Prescriptions Disp Refills   tirzepatide (MOUNJARO) 12.5 MG/0.5ML Pen 6 mL 4    Sig: Inject 12.5 mg into the skin once a week.    Authorizing Provider: THAPA, [place]    Ordering User: Pollie Meyer

## 2023-07-14 ENCOUNTER — Other Ambulatory Visit (INDEPENDENT_AMBULATORY_CARE_PROVIDER_SITE_OTHER): Payer: Medicare Other

## 2023-07-14 DIAGNOSIS — I10 Essential (primary) hypertension: Secondary | ICD-10-CM

## 2023-07-14 LAB — BASIC METABOLIC PANEL
BUN: 23 mg/dL (ref 6–23)
CO2: 29 meq/L (ref 19–32)
Calcium: 9.6 mg/dL (ref 8.4–10.5)
Chloride: 98 meq/L (ref 96–112)
Creatinine, Ser: 1.09 mg/dL (ref 0.40–1.50)
GFR: 68.97 mL/min (ref >60.00)
Glucose, Bld: 129 mg/dL — ABNORMAL HIGH (ref 70–99)
Potassium: 4.4 meq/L (ref 3.5–5.1)
Sodium: 135 meq/L (ref 135–145)

## 2023-07-17 ENCOUNTER — Encounter: Payer: Self-pay | Admitting: Family Medicine

## 2023-07-23 ENCOUNTER — Encounter: Payer: Self-pay | Admitting: Endocrinology

## 2023-07-23 DIAGNOSIS — E1169 Type 2 diabetes mellitus with other specified complication: Secondary | ICD-10-CM

## 2023-07-26 MED ORDER — METFORMIN HCL 1000 MG PO TABS: 2000.0000 mg | ORAL_TABLET | Freq: Every evening | ORAL | 1 refills | Status: DC

## 2023-07-26 MED ORDER — DAPAGLIFLOZIN PROPANEDIOL 10 MG PO TABS: 10.0000 mg | ORAL_TABLET | Freq: Every day | ORAL | 2 refills | Status: DC

## 2023-07-28 ENCOUNTER — Encounter: Payer: Self-pay | Admitting: Dietician

## 2023-07-28 ENCOUNTER — Encounter: Payer: Medicare Other | Attending: Family Medicine | Admitting: Dietician

## 2023-07-28 DIAGNOSIS — E119 Type 2 diabetes mellitus without complications: Secondary | ICD-10-CM | POA: Insufficient documentation

## 2023-07-28 NOTE — Progress Notes (Signed)
Diabetes Self-Management Education  Visit Type: Follow-up  Appt. Start Time: 0920 Appt. End Time: 0940  07/28/2023  [name], identified by name and date of birth, is a 68 y.o. male with a diagnosis of Diabetes: Type 2.   ASSESSMENT  Primary concern: weight loss for diabetes management   History includes: depression, type 2 diabetes, HLD, HTN, artificial heart valve Labs noted: 07/07/23 A1c 6.6% Medications include: farxiga, metformin, mounjaro Supplements: magnesium, zinc, vitamin d+k, vitamin b12, turmeric, vitamin C, psyllium husk   Pt states he has been doing well since previous visit. He reports his weight at home is now 235 lb (wt was 245 lb at previous visit). Pt states he would like to continue losing weight at this rate (1lb/wk) and states he would like to be 200 lb.   Pt reports he has added some fruit into his diet.  Pt reports he continues to check his blood glucose 2-3 times per day. He states fasting has been slightly higher than before, usually in the 120s. Pt reports it never goes over 180 after meals.   Pt states he has continued his walking 5 days per week for 5 miles, and has added in some strength training.   There were no vitals taken for this visit. There is no height or weight on file to calculate BMI.   Diabetes Self-Management Education - 07/28/23 0919       Visit Information   Visit Type Follow-up      Initial Visit   Diabetes Type Type 2      Health Coping   How would you rate your overall health? Good      Psychosocial Assessment   Patient Belief/Attitude about Diabetes Motivated to manage diabetes    What is the hardest part about your diabetes right now, causing you the most concern, or is the most worrisome to you about your diabetes?   Making healty food and beverage choices    Self-care barriers None    Self-management support Doctor's office    Other persons present Patient    Patient Concerns Healthy Lifestyle    Special  Needs None    Preferred Learning Style No preference indicated      Pre-Education Assessment   Patient understands the diabetes disease and treatment process. Needs Review    Patient understands incorporating nutritional management into lifestyle. Needs Review    Patient undertands incorporating physical activity into lifestyle. Needs Review    Patient understands using medications safely. Needs Review    Patient understands monitoring blood glucose, interpreting and using results Needs Review    Patient understands prevention, detection, and treatment of acute complications. Needs Review    Patient understands prevention, detection, and treatment of chronic complications. Needs Review    Patient understands how to develop strategies to address psychosocial issues. Needs Review    Patient understands how to develop strategies to promote health/change behavior. Needs Review      Complications   Last HgB A1C per patient/outside source 6.6 %    How often do you check your blood sugar? 3-4 times/day    Fasting Blood glucose range (mg/dL) 16-109    Postprandial Blood glucose range (mg/dL) 604-540      Dietary Intake   Breakfast protein shake    Lunch 12: egg sandwich OR protein bar and eggs and fruit    Snack (afternoon) protein bar and nuts    Dinner roast beef and broccoli and apple.    Beverage(s) 3-4 c  coffee, 2 12 oz c water      Activity / Exercise   Activity / Exercise Type Moderate (swimming / aerobic walking)    How many days per week do you exercise? 5    How many minutes per day do you exercise? 90    Total minutes per week of exercise 450      Patient Education   Previous Diabetes Education Yes (please comment)    Disease Pathophysiology Factors that contribute to the development of diabetes;Explored patient's options for treatment of their diabetes    Healthy Eating Role of diet in the treatment of diabetes and the relationship between the three main macronutrients and  blood glucose level;Plate Method;Meal timing in regards to the patients' current diabetes medication.;Meal options for control of blood glucose level and chronic complications.    Being Active Role of exercise on diabetes management, blood pressure control and cardiac health.;Helped patient identify appropriate exercises in relation to his/her diabetes, diabetes complications and other health issue.    Medications Reviewed patients medication for diabetes, action, purpose, timing of dose and side effects.    Monitoring Identified appropriate SMBG and/or A1C goals.;Daily foot exams;Yearly dilated eye exam    Chronic complications Relationship between chronic complications and blood glucose control;Identified and discussed with patient  current chronic complications    Diabetes Stress and Support Identified and addressed patients feelings and concerns about diabetes    Lifestyle and Health Coping Lifestyle issues that need to be addressed for better diabetes care      Individualized Goals (developed by patient)   Nutrition General guidelines for healthy choices and portions discussed    Physical Activity Exercise 5-7 days per week;60 minutes per day    Medications take my medication as prescribed    Monitoring  Test my blood glucose as discussed    Problem Solving Eating Pattern    Reducing Risk examine blood glucose patterns;do foot checks daily;treat hypoglycemia with 15 grams of carbs if blood glucose less than 70mg /dL    Health Coping Ask for help with psychological, social, or emotional issues      Patient Self-Evaluation of Goals - Patient rates self as meeting previously set goals (% of time)   Nutrition >75% (most of the time)    Physical Activity >75% (most of the time)    Medications >75% (most of the time)    Monitoring >75% (most of the time)    Problem Solving and behavior change strategies  50 - 75 % (half of the time)    Reducing Risk (treating acute and chronic complications) 50  - 75 % (half of the time)    Health Coping 50 - 75 % (half of the time)      Post-Education Assessment   Patient understands the diabetes disease and treatment process. Comprehends key points    Patient understands incorporating nutritional management into lifestyle. Comprehends key points    Patient undertands incorporating physical activity into lifestyle. Demonstrates understanding / competency    Patient understands using medications safely. Demonstrates understanding / competency    Patient understands monitoring blood glucose, interpreting and using results Demonstrates understanding / competency    Patient understands prevention, detection, and treatment of acute complications. Comprehends key points    Patient understands prevention, detection, and treatment of chronic complications. Comprehends key points    Patient understands how to develop strategies to address psychosocial issues. Comprehends key points    Patient understands how to develop strategies to promote health/change behavior. Comprehends  key points      Outcomes   Expected Outcomes Demonstrated interest in learning. Expect positive outcomes    Future DMSE PRN    Program Status Completed             Individualized Plan for Diabetes Self-Management Training:   Learning Objective:  Patient will have a greater understanding of diabetes self-management. Patient education plan is to attend individual and/or group sessions per assessed needs and concerns.   Plan:   There are no Patient Instructions on file for this visit.  Expected Outcomes:  Demonstrated interest in learning. Expect positive outcomes  Education material provided: No handouts at this follow up  If problems or questions, patient to contact team via:  Phone  Future DSME appointment: PRN

## 2023-08-05 ENCOUNTER — Ambulatory Visit: Payer: Medicare Other | Admitting: Endocrinology

## 2023-08-12 ENCOUNTER — Other Ambulatory Visit: Payer: Self-pay

## 2023-08-12 ENCOUNTER — Ambulatory Visit (INDEPENDENT_AMBULATORY_CARE_PROVIDER_SITE_OTHER): Payer: Medicare Other | Admitting: Endocrinology

## 2023-08-12 ENCOUNTER — Encounter: Payer: Self-pay | Admitting: Endocrinology

## 2023-08-12 VITALS — BP 126/68 | HR 77 | Resp 20 | Ht 71.0 in | Wt 237.8 lb

## 2023-08-12 DIAGNOSIS — E1169 Type 2 diabetes mellitus with other specified complication: Secondary | ICD-10-CM | POA: Diagnosis not present

## 2023-08-12 DIAGNOSIS — Z7985 Long-term (current) use of injectable non-insulin antidiabetic drugs: Secondary | ICD-10-CM

## 2023-08-12 DIAGNOSIS — Z7984 Long term (current) use of oral hypoglycemic drugs: Secondary | ICD-10-CM | POA: Diagnosis not present

## 2023-08-12 DIAGNOSIS — E669 Obesity, unspecified: Secondary | ICD-10-CM | POA: Diagnosis not present

## 2023-08-12 MED ORDER — DAPAGLIFLOZIN PROPANEDIOL 10 MG PO TABS: 10.0000 mg | ORAL_TABLET | Freq: Every day | ORAL | 3 refills | Status: AC

## 2023-08-12 MED ORDER — METFORMIN HCL 1000 MG PO TABS
1000.0000 mg | ORAL_TABLET | Freq: Two times a day (BID) | ORAL | 3 refills | Status: AC
Start: 2023-08-12 — End: ?

## 2023-08-12 NOTE — Progress Notes (Signed)
 Outpatient Endocrinology Note [place] Marne Meline, MD  08/12/23  Patient's Name: Evan [name]    DOB: 07-18-1955    MRN: [id number]                                                    REASON OF VISIT: Follow-up for type 2 diabetes mellitus  PCP: Eustaquio Boyden, MD  HISTORY OF PRESENT ILLNESS:   Evan [name] is a 68 y.o. old male with past medical history listed below, is here for follow up of type 2 diabetes mellitus.   Pertinent Diabetes History: Patient was diagnosed with type 2 diabetes mellitus in 2012.  He has not been on insulin therapy.  He has controlled type 2 diabetes mellitus.  Chronic Diabetes Complications : Retinopathy: no. Last ophthalmology exam was done on annually reportedly. Nephropathy: no, on losartan. Peripheral neuropathy: no Coronary artery disease: no Stroke: no  Relevant comorbidities and cardiovascular risk factors: Obesity: yes Body mass index is 33.17 kg/m.  Hypertension: yes Hyperlipidemia. Yes, on statin.  Current / Home Diabetic regimen includes: Mounjaro 12.5 mg weekly. Metformin 1000 mg 2 times a day. Farxiga 10 mg daily.  Prior diabetic medications: Ozempic changed to [facility].  Glycemic data:   Not able to download glucometer in the clinic today.  He has Accu-Chek guide glucometer.  Blood sugar reviewed from meter directly, average blood sugar in 90 days 129.  Most of the blood sugar in the range of 120-130.  No hypoglycemia and no significant hyperglycemia.  Hypoglycemia: Patient has no hypoglycemic episodes. Patient has hypoglycemia awareness.  Factors modifying glucose control: 1.  Diabetic diet assessment: 2-3 meals a day.  2.  Staying active or exercising:   3.  Medication compliance: compliant all of the time.  Interval history  Glucometer data as reviewed above.  Diabetes regimen as reviewed and noted above above.  He has been taking Mounjaro increased dose of 12.5 mg weekly from last month, denies any GI issues.   Hemoglobin A1c recently 6.6%.  No other complaints today.  REVIEW OF SYSTEMS As per history of present illness.   PAST MEDICAL HISTORY: Past Medical History:  Diagnosis Date   Adrenal adenoma    Complication of anesthesia    difficulty to weak up   COVID-19 virus infection 05/2020   Depression    intolerant of cymbalta and wellbutrin   Diverticulosis of colon    Esophagitis 03/2015   by EGD   Gastritis 03/2015   by EGD - mild, chronic   Hyperlipidemia    Hypertension    Left ventricular hypertrophy    Lichen simplex chronicus 05/2015   R scrotum Terri Piedra)   Liver lesion    2022 CTs: There was an incidental finding of a hypervascular lesion in the right lobe of the liver as well as left adrenal nodule. He underwent MRI of the abdomen showing the appearance of the liver favored focal nodular hyperplasia, as well as small left adrenal adenoma.   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 Clifton James & Dr. Laneta Simmers    Severe aortic stenosis    T2DM (type 2 diabetes  mellitus) (HCC)    DSME 09/2013    PAST SURGICAL HISTORY: Past Surgical History:  Procedure Laterality Date   COLONOSCOPY  06/15/2004   diverticulosis per pt   COLONOSCOPY  03/16/2015   2 polyps, rpt 5 yrs Madilyn Fireman)   COLONOSCOPY  03/2023   TA, multiple HPs, fair colon prep, sigmoid diverticulosis, rpt ?(Brahmbhatt)   ESOPHAGOGASTRODUODENOSCOPY  03/16/2015   mild chronic gastritis, esophagitis    INTRAOPERATIVE TRANSTHORACIC ECHOCARDIOGRAM Left 10/29/2020   Procedure: INTRAOPERATIVE TRANSTHORACIC ECHOCARDIOGRAM;  Surgeon: Kathleene Hazel, MD;  [facility]: [facility] OR;  Service: Open Heart Surgery;  Laterality: Left;   LUMBAR LAMINECTOMY  06/16/1987   L5-S1   RIGHT/LEFT HEART CATH AND CORONARY ANGIOGRAPHY N/A 08/15/2020    Procedure: RIGHT/LEFT HEART CATH AND CORONARY ANGIOGRAPHY;  Surgeon: Kathleene Hazel, MD;  Location: [facility] INVASIVE CV LAB;  Service: Cardiovascular;  Laterality: N/A;   TRANSCATHETER AORTIC VALVE REPLACEMENT, TRANSFEMORAL Bilateral 10/29/2020   Procedure: TRANSCATHETER AORTIC VALVE REPLACEMENT, TRANSFEMORAL;  Surgeon: Kathleene Hazel, MD;  Location: [facility] OR;  Service: Open Heart Surgery;  Laterality: Bilateral;   ULTRASOUND GUIDANCE FOR VASCULAR ACCESS Bilateral 10/29/2020   Procedure: ULTRASOUND GUIDANCE FOR VASCULAR ACCESS;  Surgeon: Kathleene Hazel, MD;  Location: [facility] OR;  Service: Open Heart Surgery;  Laterality: Bilateral;    ALLERGIES: Allergies  Allergen Reactions   Penicillins     Respiratory distress as infant    FAMILY HISTORY:  Family History  Problem Relation Age of Onset   Alzheimer's disease Father    Hypertension Father    Stroke Father    Hyperlipidemia Father    Prostate cancer Brother    Diabetes Brother    Alcohol abuse Brother    CAD Brother    Coronary artery disease Maternal Uncle    Schizophrenia Paternal Uncle    Emphysema Mother    Alcohol abuse Mother    Stroke Maternal Grandmother    Stroke Maternal Grandfather    Diabetes Maternal Grandfather    Stroke Paternal Grandmother    Stroke Paternal Grandfather     SOCIAL HISTORY: Social History   Socioeconomic History   Marital status: Married    Spouse name: Not on file   Number of children: 2   Years of education: Not on file   Highest education level: Master's degree (e.g., MA, MS, MEng, MEd, MSW, MBA)  Occupational History   Occupation: Retired-Worked for the [employer]  Tobacco Use   Smoking status: Never   Smokeless tobacco: Never  Substance and Sexual Activity   Alcohol use: Not Currently    Alcohol/week: 0.0 standard drinks of alcohol    Comment: occasional   Drug use: No   Sexual activity: Not on file  Other Topics Concern   Not on file  Social History  Narrative   Caffeine: 3 cups coffee/day   Lives with wife and daughter, son in college, 2 cats and 1 dog   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  Keeps record of caloric intake   Social Drivers of Health   Financial Resource Strain: Low Risk  (07/03/2023)   Overall Financial Resource Strain (CARDIA)    Difficulty of Paying Living Expenses: Not hard at all  Food Insecurity: No Food Insecurity (07/03/2023)   Hunger Vital Sign    Worried About Running Out of Food in the Last Year: Never true    Ran Out of Food in the  Last Year: Never true  Transportation Needs: No Transportation Needs (07/03/2023)   PRAPARE - Administrator, Civil Service (Medical): No    Lack of Transportation (Non-Medical): No  Physical Activity: Sufficiently Active (07/03/2023)   Exercise Vital Sign    Days of Exercise per Week: 5 days    Minutes of Exercise per Session: 110 min  Stress: No Stress Concern Present (07/03/2023)   [employer] of Occupational Health - Occupational Stress Questionnaire    Feeling of Stress : Only a little  Social Connections: Socially Integrated (07/03/2023)   Social Connection and Isolation Panel [NHANES]    Frequency of Communication with Friends and Family: Twice a week    Frequency of Social Gatherings with Friends and Family: Three times a week    Attends Religious Services: More than 4 times per year    Active Member of Clubs or Organizations: Yes    Attends Banker Meetings: 1 to 4 times per year    Marital Status: Married    MEDICATIONS:  Current Outpatient Medications  Medication Sig Dispense Refill   aspirin EC 81 MG tablet Take 81 mg by mouth every evening. Swallow whole.     cephALEXin (KEFLEX) 500 MG capsule TAKE 4 CAPSULES BY MOUTH 60 MINUTES PRIOR TO ANY DENTAL APPOINTMENTS 12 capsule 2   Cyanocobalamin (VITAMIN B-12) 2500 MCG SUBL  Take 2,500 mcg by mouth daily.     fluticasone (FLONASE) 50 MCG/ACT nasal spray Place 1 spray into both nostrils daily as needed for allergies or rhinitis.     glucose blood (ACCU-CHEK GUIDE) test strip Check blood sugar 2 times daily 100 each 12   Lancets (ACCU-CHEK MULTICLIX) lancets Check blood sugar 2 times daily 100 each 12   losartan (COZAAR) 100 MG tablet Take 1 tablet (100 mg total) by mouth daily. 90 tablet 3   Magnesium 250 MG TABS Take 250 mg by mouth every evening.     omeprazole (PRILOSEC) 20 MG capsule Take 1 capsule (20 mg total) by mouth daily.     rosuvastatin (CRESTOR) 5 MG tablet Take 1 tablet (5 mg total) by mouth daily. 90 tablet 4   sildenafil (REVATIO) 20 MG tablet TAKE 5 TABLETS BY MOUTH ONCE DAILY AS NEEDED 30 tablet 5   tadalafil (CIALIS) 5 MG tablet Take 1 tablet (5 mg total) by mouth daily. 30 tablet 11   tirzepatide (MOUNJARO) 12.5 MG/0.5ML Pen Inject 12.5 mg into the skin once a week. 6 mL 4   TURMERIC PO Take 1,800 mg by mouth every evening.     vitamin C (ASCORBIC ACID) 500 MG tablet Take 500 mg by mouth daily.     Vitamin D-Vitamin K (K2 PLUS D3 PO) Take 1 tablet by mouth every evening.     Zinc 50 MG TABS Take 50 mg by mouth every evening.     amLODipine (NORVASC) 10 MG tablet Take 1 tablet (10 mg total) by mouth daily. 90 tablet 3   dapagliflozin propanediol (FARXIGA) 10 MG TABS tablet Take 1 tablet (10 mg total) by mouth daily. 90 tablet 3   metFORMIN (GLUCOPHAGE) 1000 MG tablet Take 1 tablet (1,000 mg total) by mouth 2 (two) times daily with a meal. 180 tablet 3   No current facility-administered medications for this visit.    PHYSICAL EXAM: Vitals:   08/12/23 0856  BP: 126/68  Pulse: 77  Resp: 20  SpO2: 97%  Weight: 237 lb 12.8 oz (107.9 kg)  Height: 5\' 11"  (1.803  m)   Body mass index is 33.17 kg/m.  Wt Readings from Last 3 Encounters:  08/12/23 237 lb 12.8 oz (107.9 kg)  07/07/23 241 lb 4 oz (109.4 kg)  04/06/23 243 lb 9.6 oz (110.5 kg)     General: Well developed, well nourished male in no apparent distress.  HEENT: AT/[place], no external lesions.  Eyes: Conjunctiva clear and no icterus. Neck: Neck supple  Lungs: Respirations not labored Neurologic: Alert, oriented, normal speech Extremities / Skin: Dry.   Psychiatric: Does not appear depressed or anxious  Diabetic Foot Exam - Simple   No data filed    LABS Reviewed Lab Results  Component Value Date   HGBA1C 6.6 (A) 07/07/2023   HGBA1C 6.3 04/02/2023   HGBA1C 6.5 12/28/2022   Lab Results  Component Value Date   FRUCTOSAMINE 272 12/25/2020   FRUCTOSAMINE 224 12/19/2019   FRUCTOSAMINE 239 12/13/2017   Lab Results  Component Value Date   CHOL 161 12/28/2022   HDL 76.70 12/28/2022   LDLCALC 68 12/28/2022   LDLDIRECT 89.0 12/23/2021   TRIG 82.0 12/28/2022   CHOLHDL 2 12/28/2022   Lab Results  Component Value Date   MICRALBCREAT 1.2 09/29/2022   MICRALBCREAT 1.5 07/29/2021   Lab Results  Component Value Date   CREATININE 1.09 07/14/2023   Lab Results  Component Value Date   GFR 68.97 07/14/2023    ASSESSMENT / PLAN  1. Type 2 diabetes mellitus with obesity (HCC)   2. Type 2 diabetes mellitus with other specified complication, without long-term current use of insulin (HCC)      Diabetes Mellitus type 2, complicated by no known complications. - Diabetic status / severity: fair Controlled.  Lab Results  Component Value Date   HGBA1C 6.6 (A) 07/07/2023    - Hemoglobin A1c goal : <6.5%  - Medications: No change.  I) Mounjaro 12.5 mg weekly.  Increasing dose to 15 mg can be considered.  Patient prefers to stay on current dose. II) metformin 1000 mg 2 times a day. III) Farxiga 10 mg daily.  - Home glucose testing: Check blood sugar in the morning fasting and occasionally at bedtime. - Discussed/ Gave Hypoglycemia treatment plan.  # Consult : not required at this time.   # Annual urine for microalbuminuria/ creatinine ratio, no  microalbuminuria currently, continue ACE/ARB /losartan. Last  Lab Results  Component Value Date   MICRALBCREAT 1.2 09/29/2022   -Will check urine microalbumin creatinine ratio today.  # Foot check nightly / neuropathy.  # Annual dilated diabetic eye exams.   - Diet: Make healthy diabetic food choices - Life style / activity / exercise: discussed.   2. Blood pressure  -  BP Readings from Last 1 Encounters:  08/12/23 126/68    - Control is  in target.  - No change in current plans.   3. Lipid status / Hyperlipidemia - Last  Lab Results  Component Value Date   LDLCALC 68 12/28/2022   - Continue crestor 5mg  daily.   Diagnoses and all orders for this visit:  Type 2 diabetes mellitus with obesity (HCC) -     Microalbumin / creatinine urine ratio -     BASIC METABOLIC PANEL WITH GFR -     Hemoglobin A1c -     Lipid panel -     Comprehensive metabolic panel -     dapagliflozin propanediol (FARXIGA) 10 MG TABS tablet; Take 1 tablet (10 mg total) by mouth daily.  Type 2 diabetes mellitus with  other specified complication, without long-term current use of insulin (HCC) -     metFORMIN (GLUCOPHAGE) 1000 MG tablet; Take 1 tablet (1,000 mg total) by mouth 2 (two) times daily with a meal.     DISPOSITION Follow up in clinic in 4 months suggested.  Labs prior to follow-up visit as ordered.   All questions answered and patient verbalized understanding of the plan.  [place] Deklen Popelka, MD [facility] Endocrinology [facility] [address] [place], [address] [place], [place] [address] Phone # [phone]  At least part of this note was generated using voice recognition software. Inadvertent word errors may have occurred, which were not recognized during the proofreading process.

## 2023-08-17 DIAGNOSIS — E1169 Type 2 diabetes mellitus with other specified complication: Secondary | ICD-10-CM | POA: Diagnosis not present

## 2023-08-17 DIAGNOSIS — E669 Obesity, unspecified: Secondary | ICD-10-CM | POA: Diagnosis not present

## 2023-08-18 ENCOUNTER — Encounter: Payer: Self-pay | Admitting: Endocrinology

## 2023-08-18 LAB — MICROALBUMIN / CREATININE URINE RATIO
Creatinine, Urine: 30 mg/dL (ref 20–320)
Microalb, Ur: <0.2 mg/dL

## 2023-09-07 ENCOUNTER — Encounter: Payer: Self-pay | Admitting: Internal Medicine

## 2023-09-09 ENCOUNTER — Encounter: Payer: Self-pay | Admitting: Endocrinology

## 2023-09-09 DIAGNOSIS — E669 Obesity, unspecified: Secondary | ICD-10-CM

## 2023-09-09 MED ORDER — TIRZEPATIDE 12.5 MG/0.5ML ~~LOC~~ SOAJ: 12.5000 mg | SUBCUTANEOUS | 1 refills | Status: DC

## 2023-09-13 MED ORDER — CEPHALEXIN 500 MG PO CAPS: ORAL_CAPSULE | ORAL | 2 refills | Status: AC

## 2023-09-25 ENCOUNTER — Other Ambulatory Visit: Payer: Self-pay | Admitting: Internal Medicine

## 2023-09-28 ENCOUNTER — Other Ambulatory Visit: Payer: Self-pay | Admitting: Family Medicine

## 2023-09-28 DIAGNOSIS — N529 Male erectile dysfunction, unspecified: Secondary | ICD-10-CM

## 2023-12-07 ENCOUNTER — Other Ambulatory Visit: Payer: Medicare Other

## 2023-12-07 DIAGNOSIS — R972 Elevated prostate specific antigen [PSA]: Secondary | ICD-10-CM | POA: Diagnosis not present

## 2023-12-10 ENCOUNTER — Ambulatory Visit: Payer: Medicare Other | Admitting: Endocrinology

## 2023-12-14 DIAGNOSIS — R972 Elevated prostate specific antigen [PSA]: Secondary | ICD-10-CM | POA: Diagnosis not present

## 2023-12-14 DIAGNOSIS — N5201 Erectile dysfunction due to arterial insufficiency: Secondary | ICD-10-CM | POA: Diagnosis not present

## 2023-12-20 ENCOUNTER — Encounter: Payer: Self-pay | Admitting: Internal Medicine

## 2023-12-27 ENCOUNTER — Other Ambulatory Visit: Payer: Self-pay | Admitting: Family Medicine

## 2023-12-27 DIAGNOSIS — K760 Fatty (change of) liver, not elsewhere classified: Secondary | ICD-10-CM

## 2023-12-27 DIAGNOSIS — E1169 Type 2 diabetes mellitus with other specified complication: Secondary | ICD-10-CM

## 2023-12-31 ENCOUNTER — Other Ambulatory Visit (INDEPENDENT_AMBULATORY_CARE_PROVIDER_SITE_OTHER): Payer: Medicare Other

## 2023-12-31 DIAGNOSIS — E785 Hyperlipidemia, unspecified: Secondary | ICD-10-CM | POA: Diagnosis not present

## 2023-12-31 DIAGNOSIS — E1169 Type 2 diabetes mellitus with other specified complication: Secondary | ICD-10-CM

## 2023-12-31 DIAGNOSIS — K760 Fatty (change of) liver, not elsewhere classified: Secondary | ICD-10-CM | POA: Diagnosis not present

## 2023-12-31 LAB — COMPREHENSIVE METABOLIC PANEL WITH GFR
ALT: 22 U/L (ref 0–53)
AST: 16 U/L (ref 0–37)
Albumin: 4.6 g/dL (ref 3.5–5.2)
Alkaline Phosphatase: 43 U/L (ref 39–117)
BUN: 19 mg/dL (ref 6–23)
CO2: 30 meq/L (ref 19–32)
Calcium: 9.8 mg/dL (ref 8.4–10.5)
Chloride: 99 meq/L (ref 96–112)
Creatinine, Ser: 1.06 mg/dL (ref 0.40–1.50)
GFR: 71.09 mL/min (ref >60.00)
Glucose, Bld: 122 mg/dL — ABNORMAL HIGH (ref 70–99)
Potassium: 4.8 meq/L (ref 3.5–5.1)
Sodium: 138 meq/L (ref 135–145)
Total Bilirubin: 0.7 mg/dL (ref 0.2–1.2)
Total Protein: 7 g/dL (ref 6.0–8.3)

## 2023-12-31 LAB — CBC WITH DIFFERENTIAL/PLATELET
Basophils Absolute: 0 K/uL (ref 0.0–0.1)
Basophils Relative: 0.6 % (ref 0.0–3.0)
Eosinophils Absolute: 0.1 K/uL (ref 0.0–0.7)
Eosinophils Relative: 1.9 % (ref 0.0–5.0)
HCT: 44.1 % (ref 39.0–52.0)
Hemoglobin: 14.6 g/dL (ref 13.0–17.0)
Lymphocytes Relative: 32.6 % (ref 12.0–46.0)
Lymphs Abs: 2 K/uL (ref 0.7–4.0)
MCHC: 33.1 g/dL (ref 30.0–36.0)
MCV: 96.9 fl (ref 78.0–100.0)
Monocytes Absolute: 0.8 K/uL (ref 0.1–1.0)
Monocytes Relative: 13.6 % — ABNORMAL HIGH (ref 3.0–12.0)
Neutro Abs: 3.2 K/uL (ref 1.4–7.7)
Neutrophils Relative %: 51.3 % (ref 43.0–77.0)
Platelets: 255 K/uL (ref 150.0–400.0)
RBC: 4.55 Mil/uL (ref 4.22–5.81)
RDW: 14.4 % (ref 11.5–15.5)
WBC: 6.2 K/uL (ref 4.0–10.5)

## 2023-12-31 LAB — LIPID PANEL
Cholesterol: 167 mg/dL (ref 0–200)
HDL: 76.4 mg/dL (ref >39.00)
LDL Cholesterol: 68 mg/dL (ref 0–99)
NonHDL: 90.64
Total CHOL/HDL Ratio: 2
Triglycerides: 114 mg/dL (ref 0.0–149.0)
VLDL: 22.8 mg/dL (ref 0.0–40.0)

## 2023-12-31 LAB — MICROALBUMIN / CREATININE URINE RATIO
Creatinine,U: 45.1 mg/dL
Microalb Creat Ratio: UNDETERMINED mg/g (ref 0.0–30.0)
Microalb, Ur: <0.7 mg/dL

## 2023-12-31 LAB — VITAMIN B12: Vitamin B-12: 716 pg/mL (ref 211–911)

## 2023-12-31 LAB — HEMOGLOBIN A1C: Hgb A1c MFr Bld: 6.5 % (ref 4.6–6.5)

## 2024-01-03 ENCOUNTER — Ambulatory Visit: Payer: Self-pay | Admitting: Family Medicine

## 2024-01-05 ENCOUNTER — Ambulatory Visit (INDEPENDENT_AMBULATORY_CARE_PROVIDER_SITE_OTHER)

## 2024-01-05 VITALS — Ht 71.0 in | Wt 237.0 lb

## 2024-01-05 DIAGNOSIS — K635 Polyp of colon: Secondary | ICD-10-CM | POA: Diagnosis not present

## 2024-01-05 DIAGNOSIS — Z0000 Encounter for general adult medical examination without abnormal findings: Secondary | ICD-10-CM

## 2024-01-05 NOTE — Patient Instructions (Addendum)
 [name] , Thank you for taking time out of your busy schedule to complete your Annual Wellness Visit with me. I enjoyed our conversation and look forward to speaking with you again next year. I, as well as your care team,  appreciate your ongoing commitment to your health goals. Please review the following plan we discussed and let me know if I can assist you in the future. Your Game plan/ To Do List    Referrals: If you haven't heard from the office you've been referred to, please reach out to them at the phone provided.  Referral to Dr Rosalie of [facility] for a repeat Colonoscopy Follow up Visits: Next Medicare AWV with our clinical staff: 01/05/2025   Have you seen your provider in the last 6 months (3 months if uncontrolled diabetes)? No Next Office Visit with your provider: 01/07/2024 - Physical  Clinician Recommendations:  Aim for 30 minutes of exercise or brisk walking, 6-8 glasses of water, and 5 servings of fruits and vegetables each day. Educated and advised on getting the Tdap (Tetenus) and Shingles vaccines in 2025 at local pharmacy.      This is a list of the screening recommended for you and due dates:  Health Maintenance  Topic Date Due   Zoster (Shingles) Vaccine (1 of 2) Never done   Stool Blood Test  01/01/2022   DTaP/Tdap/Td vaccine (2 - Td or Tdap) 04/26/2022   COVID-19 Vaccine (4 - 2024-25 season) 02/14/2023   Flu Shot  01/14/2024   Eye exam for diabetics  05/31/2024   Hemoglobin A1C  07/02/2024   Complete foot exam   07/06/2024   Yearly kidney function blood test for diabetes  12/30/2024   Yearly kidney health urinalysis for diabetes  12/30/2024   Medicare Annual Wellness Visit  01/04/2025   Colon Cancer Screening  04/08/2033   Pneumococcal Vaccine for age over 69  Completed   Hepatitis C Screening  Completed   Hepatitis B Vaccine  Aged Out   HPV Vaccine  Aged Out   Meningitis B Vaccine  Aged Out    Advanced directives: (Copy  Requested) Please bring a copy of your health care power of attorney and living will to the office to be added to your chart at your convenience. You can mail to [facility] [address] [place], [place] [address] or email to [email or web address] Advance Care Planning is important because it:  [x]  Makes sure you receive the medical care that is consistent with your values, goals, and preferences  [x]  It provides guidance to your family and loved ones and reduces their decisional burden about whether or not they are making the right decisions based on your wishes.  Follow the link provided in your after visit summary or read over the paperwork we have mailed to you to help you started getting your Advance Directives in place. If you need assistance in completing these, please reach out to us  so that we can help you!

## 2024-01-05 NOTE — Progress Notes (Signed)
 Subjective:   Evan [name] is a 68 y.o. who presents for a Medicare Wellness preventive visit.  As a reminder, Annual Wellness Visits don't include a physical exam, and some assessments may be limited, especially if this visit is performed virtually. We may recommend an in-person follow-up visit with your provider if needed.  Visit Complete: Virtual I connected with  Toribio KANDICE Fire on 01/05/24 by a audio enabled telemedicine application and verified that I am speaking with the correct person using two identifiers.  Patient Location: Home  Provider Location: Office/Clinic  I discussed the limitations of evaluation and management by telemedicine. The patient expressed understanding and agreed to proceed.  Vital Signs: Because this visit was a virtual/telehealth visit, some criteria may be missing or patient reported. Any vitals not documented were not able to be obtained and vitals that have been documented are patient reported.  VideoDeclined- This patient declined Librarian, academic. Therefore the visit was completed with audio only.  Persons Participating in Visit: Patient.  AWV Questionnaire: Yes: Patient Medicare AWV questionnaire was completed by the patient on 01/01/2024; I have confirmed that all information answered by patient is correct and no changes since this date.  Cardiac Risk Factors include: advanced age (>71men, >69 women);diabetes mellitus;hypertension;male gender     Objective:    Today's Vitals   01/05/24 0921  Weight: 237 lb (107.5 kg)  Height: 5' 11 (1.803 m)   Body mass index is 33.05 kg/m.     05/28/2023    9:52 AM 12/25/2020    1:20 PM 10/29/2020   12:24 PM 10/28/2020    3:48 PM 08/15/2020    7:22 AM 02/18/2017   10:45 AM 10/10/2013    9:44 AM  Advanced Directives  Does Patient Have a Medical Advance Directive? Yes Yes Yes Yes Yes Yes  Patient has advance directive, copy not in chart   Type of Advance Directive   Healthcare Power of [place];Living will Living will Healthcare Power of [place];Living will Healthcare Power of [place];Living will    Does patient want to make changes to medical advance directive? No - Guardian declined        Copy of Healthcare Power of Attorney in Chart?  No - copy requested          Data saved with a previous flowsheet row definition    Current Medications (verified) Outpatient Encounter Medications as of 01/05/2024  Medication Sig   amLODipine  (NORVASC ) 10 MG tablet Take 1 tablet (10 mg total) by mouth daily.   aspirin  EC 81 MG tablet Take 81 mg by mouth every evening. Swallow whole.   cephALEXin  (KEFLEX ) 500 MG capsule TAKE 4 CAPSULES BY MOUTH 60 MINUTES PRIOR TO ANY DENTAL APPOINTMENTS   Cyanocobalamin  (VITAMIN B-12) 2500 MCG SUBL Take 2,500 mcg by mouth daily.   dapagliflozin  propanediol (FARXIGA ) 10 MG TABS tablet Take 1 tablet (10 mg total) by mouth daily.   fluticasone  (FLONASE ) 50 MCG/ACT nasal spray Place 1 spray into both nostrils daily as needed for allergies or rhinitis.   glucose blood (ACCU-CHEK GUIDE) test strip Check blood sugar 2 times daily   Lancets (ACCU-CHEK MULTICLIX) lancets Check blood sugar 2 times daily   losartan  (COZAAR ) 100 MG tablet Take 1 tablet (100 mg total) by mouth daily.   Magnesium  250 MG TABS Take 250 mg by mouth every evening.   metFORMIN  (GLUCOPHAGE ) 1000 MG tablet Take 1 tablet (1,000 mg total) by mouth 2 (two) times daily with a meal.  omeprazole  (PRILOSEC) 20 MG capsule Take 1 capsule (20 mg total) by mouth daily.   rosuvastatin  (CRESTOR ) 5 MG tablet Take 1 tablet (5 mg total) by mouth daily.   sildenafil  (REVATIO ) 20 MG tablet TAKE 5 TABLETS BY MOUTH ONCE DAILY AS NEEDED   tadalafil  (CIALIS ) 5 MG tablet Take 1 tablet (5 mg total) by mouth daily.   tirzepatide  (MOUNJARO ) 12.5 MG/0.5ML Pen Inject 12.5 mg into the skin once a week.   TURMERIC PO Take 1,800 mg by mouth every evening.   vitamin C (ASCORBIC ACID) 500 MG tablet  Take 500 mg by mouth daily.   Vitamin D -Vitamin K (K2 PLUS D3 PO) Take 1 tablet by mouth every evening.   Zinc 50 MG TABS Take 50 mg by mouth every evening.   No facility-administered encounter medications on file as of 01/05/2024.    Allergies (verified) Penicillins   History: Past Medical History:  Diagnosis Date   Adrenal adenoma    Complication of anesthesia    difficulty to weak up   COVID-19 virus infection 05/2020   Depression    intolerant of cymbalta and wellbutrin   Diverticulosis of colon    Esophagitis 03/2015   by EGD   Gastritis 03/2015   by EGD - mild, chronic   Hyperlipidemia    Hypertension    Left ventricular hypertrophy    Lichen simplex chronicus 05/2015   R scrotum Bobie)   Liver lesion    2022 CTs: There was an incidental finding of a hypervascular lesion in the right lobe of the liver as well as left adrenal nodule. He underwent MRI of the abdomen showing the appearance of the liver favored focal nodular hyperplasia, as well as small left adrenal adenoma.   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 Verlin & Dr. Lucas    Severe aortic stenosis    T2DM (type 2 diabetes mellitus) (HCC)    DSME 09/2013   Past Surgical History:  Procedure Laterality Date   COLONOSCOPY  06/15/2004   diverticulosis per pt   COLONOSCOPY  03/16/2015   2 polyps, rpt 5 yrs Kristen)   COLONOSCOPY  03/2023   TA, multiple HPs, fair colon prep, sigmoid diverticulosis, rpt ?([place]rahmbhatt)   ESOPHAGOGASTRODUODENOSCOPY  03/16/2015   mild chronic gastritis, esophagitis    INTRAOPERATIVE TRANSTHORACIC ECHOCARDIOGRAM Left 10/29/2020   Procedure: INTRAOPERATIVE TRANSTHORACIC ECHOCARDIOGRAM;  Surgeon: Verlin Lonni BIRCH, MD;  Location[facility] OR;  Service: Open Heart Surgery;   Laterality: Left;   LUMBAR LAMINECTOMY  06/16/1987   L5-S1   RIGHT/LEFT HEART CATH AND CORONARY ANGIOGRAPHY N/A 08/15/2020   Procedure: RIGHT/LEFT HEART CATH AND CORONARY ANGIOGRAPHY;  Surgeon: Verlin Lonni BIRCH, MD;  Location: [facility] INVASIVE CV LAB;  Service: Cardiovascular;  Laterality: N/A;   TRANSCATHETER AORTIC VALVE REPLACEMENT, TRANSFEMORAL Bilateral 10/29/2020   Procedure: TRANSCATHETER AORTIC VALVE REPLACEMENT, TRANSFEMORAL;  Surgeon: Verlin Lonni BIRCH, MD;  Location: [facility] OR;  Service: Open Heart Surgery;  Laterality: Bilateral;   ULTRASOUND GUIDANCE FOR VASCULAR ACCESS Bilateral 10/29/2020   Procedure: ULTRASOUND GUIDANCE FOR VASCULAR ACCESS;  Surgeon: Verlin Lonni BIRCH, MD;  Location: [facility];  Service: Open Heart Surgery;  Laterality: Bilateral;   Family History  Problem Relation Age of Onset   Alzheimer's disease Father    Hypertension Father  Stroke Father    Hyperlipidemia Father    Prostate cancer Brother    Diabetes Brother    Alcohol abuse Brother    CAD Brother    Coronary artery disease Maternal Uncle    Schizophrenia Paternal Uncle    Emphysema Mother    Alcohol abuse Mother    Stroke Maternal Grandmother    Stroke Maternal Grandfather    Diabetes Maternal Grandfather    Stroke Paternal Grandmother    Stroke Paternal Grandfather    Social History   Socioeconomic History   Marital status: Married    Spouse name: Not on file   Number of children: 2   Years of education: Not on file   Highest education level: Master's degree (e.g., MA, MS, MEng, MEd, MSW, MBA)  Occupational History   Occupation: Retired-Worked for the [employer]  Tobacco Use   Smoking status: Never   Smokeless tobacco: Never  Substance and Sexual Activity   Alcohol use: Not Currently    Alcohol/week: 1.0 standard drink of alcohol    Types: 1 Shots of liquor per week    Comment: occasional   Drug use: No   Sexual activity: Yes  Other Topics Concern   Not on  file  Social History Narrative   Caffeine: 3 cups coffee/day   Lives with wife and daughter, son in college, 2 cats and 1 dog   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  Keeps record of caloric intake   Social Drivers of Health   Financial Resource Strain: Low Risk  (01/05/2024)   Overall Financial Resource Strain (CARDIA)    Difficulty of Paying Living Expenses: Not hard at all  Food Insecurity: No Food Insecurity (01/05/2024)   Hunger Vital Sign    Worried About Running Out of Food in the Last Year: Never true    Ran Out of Food in the Last Year: Never true  Transportation Needs: No Transportation Needs (01/05/2024)   PRAPARE - Administrator, Civil Service (Medical): No    Lack of Transportation (Non-Medical): No  Physical Activity: Sufficiently Active (01/05/2024)   Exercise Vital Sign    Days of Exercise per Week: 6 days    Minutes of Exercise per Session: 100 min  Stress: No Stress Concern Present (01/05/2024)   [employer] of Occupational Health - Occupational Stress Questionnaire    Feeling of Stress: Only a little  Social Connections: Socially Integrated (01/05/2024)   Social Connection and Isolation Panel    Frequency of Communication with Friends and Family: Once a week    Frequency of Social Gatherings with Friends and Family: Twice a week    Attends Religious Services: More than 4 times per year    Active Member of [employer] or Organizations: Yes    Attends Engineer, structural: More than 4 times per year    Marital Status: Married    Tobacco Counseling Counseling given: No    Clinical Intake:  Pre-visit preparation completed: Yes  Pain : No/denies pain     BMI - recorded: 33.05 Nutritional Status: BMI > 30  Obese Nutritional Risks: None Diabetes: Yes CBG done?: Yes CBG resulted in Enter/ Edit results?: Yes (fasting - 140) Did  pt. bring in CBG monitor from home?: No  Lab Results  Component Value Date   HGBA1C 6.5 12/31/2023   HGBA1C 6.6 (A) 07/07/2023   HGBA1C 6.3 04/02/2023  How often do you need to have someone help you when you read instructions, pamphlets, or other written materials from your doctor or pharmacy?: 1 - Never  Interpreter Needed?: No  Information entered by :: Verdie Saba, CMA   Activities of Daily Living     01/05/2024    9:27 AM 01/01/2024    8:46 AM  In your present state of health, do you have any difficulty performing the following activities:  Hearing? 0 0  Vision? 0 0  Difficulty concentrating or making decisions? 0 0  Walking or climbing stairs? 0 0  Dressing or bathing? 0 0  Doing errands, shopping? 0 0  Preparing Food and eating ? N N  Using the Toilet? N N  In the past six months, have you accidently leaked urine? N N  Do you have problems with loss of bowel control? N N  Managing your Medications? N N  Managing your Finances? N N  Housekeeping or managing your Housekeeping? N N    Patient Care Team: Rilla Baller, MD as PCP - General (Family Medicine) Loni Soyla LABOR, MD as PCP - Cardiology (Cardiology) Fate Morna SAILOR, [facility] (Inactive) as Pharmacist (Pharmacist)  I have updated your Care Teams any recent Medical Services you may have received from other providers in the past year.     Assessment:   This is a routine wellness examination for Colon.  Hearing/Vision screen Hearing Screening - Comments:: Denies hearing difficulties   Vision Screening - Comments:: Wears eyeglasses for reading - up to date with routine eye exams with Dr Robinson   Goals Addressed               This Visit's Progress     Patient Stated (pt-stated)        Patient stated that he plans to manage his diet and wants to lose weight.       Depression Screen     01/05/2024    9:28 AM 07/07/2023    9:02 AM 05/28/2023    9:53 AM 01/04/2023   10:37 AM 07/03/2022    10:35 AM 12/30/2021    9:42 AM 12/25/2020    1:32 PM  PHQ 2/9 Scores  PHQ - 2 Score 0 0 0 0 0 2 0  PHQ- 9 Score 0 1  4 3 7  0    Fall Risk     01/05/2024    9:27 AM 01/01/2024    8:46 AM 07/07/2023    9:02 AM 05/28/2023    9:53 AM 01/04/2023   10:37 AM  Fall Risk   Falls in the past year? 0 1 0 0 0  Comment no falls - confirmed with pt      Number falls in past yr: 0 0     Injury with Fall? 0 0     Risk for fall due to : No Fall Risks      Follow up Falls evaluation completed;Falls prevention discussed        MEDICARE RISK AT HOME:  Medicare Risk at Home Any stairs in or around the home?: Yes If so, are there any without handrails?: No Home free of loose throw rugs in walkways, pet beds, electrical cords, etc?: No Adequate lighting in your home to reduce risk of falls?: Yes Life alert?: No Use of a cane, walker or w/c?: No Grab bars in the bathroom?: No Shower chair or bench in shower?: No Elevated toilet seat or a handicapped toilet?: No  TIMED UP AND  GO:  Was the test performed?  No  Cognitive Function: 6CIT completed    12/25/2020    1:33 PM  MMSE - Mini Mental State Exam  Not completed: Refused        01/05/2024    9:29 AM  6CIT Screen  What Year? 0 points  What month? 0 points  What time? 0 points  Count back from 20 0 points  Months in reverse 0 points  Repeat phrase 2 points  Total Score 2 points    Immunizations Immunization History  Administered Date(s) Administered   Fluad Quad(high Dose 65+) 03/27/2019, 03/13/2020, 03/27/2021, 03/19/2022   Influenza Split 04/26/2012   Influenza Whole 04/22/2010, 03/15/2013   Influenza, High Dose Seasonal PF 03/26/2023   Influenza,inj,Quad PF,6+ Mos 03/26/2014, 05/20/2015, 02/18/2016, 03/15/2017, 03/24/2018   PFIZER(Purple Top)SARS-COV-2 Vaccination 06/21/2019, 07/26/2019, 03/28/2020   Pneumococcal Conjugate-13 12/21/2018   Pneumococcal Polysaccharide-23 12/22/2012, 12/25/2019   Tdap 04/26/2012    Screening  Tests Health Maintenance  Topic Date Due   Zoster Vaccines- Shingrix (1 of 2) Never done   COLON CANCER SCREENING ANNUAL FOBT  01/01/2022   DTaP/Tdap/Td (2 - Td or Tdap) 04/26/2022   COVID-19 Vaccine (4 - 2024-25 season) 02/14/2023   INFLUENZA VACCINE  01/14/2024   OPHTHALMOLOGY EXAM  05/31/2024   HEMOGLOBIN A1C  07/02/2024   FOOT EXAM  07/06/2024   Diabetic kidney evaluation - eGFR measurement  12/30/2024   Diabetic kidney evaluation - Urine ACR  12/30/2024   Medicare Annual Wellness (AWV)  01/04/2025   Colonoscopy  04/08/2033   Pneumococcal Vaccine: 50+ Years  Completed   Hepatitis C Screening  Completed   Hepatitis B Vaccines  Aged Out   HPV VACCINES  Aged Out   Meningococcal B Vaccine  Aged Out    Health Maintenance  Health Maintenance Due  Topic Date Due   Zoster Vaccines- Shingrix (1 of 2) Never done   COLON CANCER SCREENING ANNUAL FOBT  01/01/2022   DTaP/Tdap/Td (2 - Td or Tdap) 04/26/2022   COVID-19 Vaccine (4 - 2024-25 season) 02/14/2023   Health Maintenance Items Addressed:  Referral sent to GI for colonoscopy to [facility] to Dr Oliva Boots (h/o colon polyps)  Additional Screening:  Vision Screening: Recommended annual ophthalmology exams for early detection of glaucoma and other disorders of the eye. Would you like a referral to an eye doctor? No    Dental Screening: Recommended annual dental exams for proper oral hygiene  Community Resource Referral / Chronic Care Management: CRR required this visit?  No   CCM required this visit?  No   Plan:    I have personally reviewed and noted the following in the patient's chart:   Medical and social history Use of alcohol, tobacco or illicit drugs  Current medications and supplements including opioid prescriptions. Patient is not currently taking opioid prescriptions. Functional ability and status Nutritional status Physical activity Advanced directives List of other  physicians Hospitalizations, surgeries, and ER visits in previous 12 months Vitals Screenings to include cognitive, depression, and falls Referrals and appointments  In addition, I have reviewed and discussed with patient certain preventive protocols, quality metrics, and best practice recommendations. A written personalized care plan for preventive services as well as general preventive health recommendations were provided to patient.   Verdie CHRISTELLA Saba, CMA   01/05/2024   After Visit Summary: (MyChart) Due to this being a telephonic visit, the after visit summary with patients personalized plan was offered to patient via MyChart  Notes: Nothing significant to report at this time.

## 2024-01-07 ENCOUNTER — Other Ambulatory Visit: Payer: Self-pay | Admitting: Family Medicine

## 2024-01-07 ENCOUNTER — Encounter: Payer: Self-pay | Admitting: Family Medicine

## 2024-01-07 ENCOUNTER — Ambulatory Visit: Payer: Medicare Other | Admitting: Family Medicine

## 2024-01-07 VITALS — BP 130/82 | HR 78 | Temp 98.1°F | Ht 69.0 in | Wt 239.1 lb

## 2024-01-07 DIAGNOSIS — Z952 Presence of prosthetic heart valve: Secondary | ICD-10-CM | POA: Diagnosis not present

## 2024-01-07 DIAGNOSIS — K760 Fatty (change of) liver, not elsewhere classified: Secondary | ICD-10-CM | POA: Diagnosis not present

## 2024-01-07 DIAGNOSIS — Z7189 Other specified counseling: Secondary | ICD-10-CM

## 2024-01-07 DIAGNOSIS — I10 Essential (primary) hypertension: Secondary | ICD-10-CM

## 2024-01-07 DIAGNOSIS — E1169 Type 2 diabetes mellitus with other specified complication: Secondary | ICD-10-CM

## 2024-01-07 DIAGNOSIS — K2100 Gastro-esophageal reflux disease with esophagitis, without bleeding: Secondary | ICD-10-CM | POA: Diagnosis not present

## 2024-01-07 DIAGNOSIS — K769 Liver disease, unspecified: Secondary | ICD-10-CM | POA: Diagnosis not present

## 2024-01-07 DIAGNOSIS — E785 Hyperlipidemia, unspecified: Secondary | ICD-10-CM

## 2024-01-07 DIAGNOSIS — R972 Elevated prostate specific antigen [PSA]: Secondary | ICD-10-CM | POA: Diagnosis not present

## 2024-01-07 MED ORDER — LOSARTAN POTASSIUM 100 MG PO TABS: 100.0000 mg | ORAL_TABLET | Freq: Every day | ORAL | 3 refills | Status: AC

## 2024-01-07 MED ORDER — FAMOTIDINE 20 MG PO TABS: 20.0000 mg | ORAL_TABLET | Freq: Every day | ORAL | Status: AC

## 2024-01-07 MED ORDER — ROSUVASTATIN CALCIUM 5 MG PO TABS: 5.0000 mg | ORAL_TABLET | Freq: Every day | ORAL | 3 refills | Status: AC

## 2024-01-07 NOTE — Assessment & Plan Note (Addendum)
 PSA levels have normalized. Had reassuring biopsy 2022.  Continues yearly uro f/u given fmhx

## 2024-01-07 NOTE — Assessment & Plan Note (Signed)
 LFTs remain table. Fibrosis 4 Score = .94 Fib-4 interpretation is not validated for people under 35 or over 70 years of age. However, scores under 2.0 are generally considered low risk.

## 2024-01-07 NOTE — Assessment & Plan Note (Signed)
 Chronic, stable. Continue current regimen.

## 2024-01-07 NOTE — Assessment & Plan Note (Signed)
 Chronic, stable on rosuvastatin  5mg  daily - continue. The 10-year ASCVD risk score (Arnett DK, et al., 2019) is: 29.2%   Values used to calculate the score:     Age: 68 years     Clincally relevant sex: Male     Is Non-Hispanic African American: No     Diabetic: Yes     Tobacco smoker: No     Systolic Blood Pressure: 130 mmHg     Is BP treated: Yes     HDL Cholesterol: 76.4 mg/dL     Total Cholesterol: 167 mg/dL

## 2024-01-07 NOTE — Assessment & Plan Note (Signed)
 Chronic, stable followed by endo.

## 2024-01-07 NOTE — Assessment & Plan Note (Signed)
 Continue to encourage healthy diet and lifestyle choices to affect sustainable weight loss.  Reviewed active lifestyle - encouraged continue this. Obesity complicated by comorbidities of hypertension, HLD, DM

## 2024-01-07 NOTE — Assessment & Plan Note (Signed)
 Transitioning from omeprazole  20mg  PRN to pepcid PRN

## 2024-01-07 NOTE — Patient Instructions (Addendum)
 Check with Joellen if you'd like to receive updated tetanus shot.  Continue current medicines You are doing well today Return as needed or in 1 year for next physical

## 2024-01-07 NOTE — Progress Notes (Signed)
 Ph: [phone] Fax: [phone]   Patient ID: Evan [name], male    DOB: Feb 20, 1956, 68 y.o.   MRN: [id number]  This visit was conducted in person.  BP 130/82   Pulse 78   Temp 98.1 F (36.7 C) (Oral)   Ht 5' 9 (1.753 m)   Wt 239 lb 2 oz (108.5 kg)   SpO2 99%   BMI 35.31 kg/m    CC: AMW f/u visit  Subjective:   HPI: Evan [name] is a 68 y.o. male presenting on 01/07/2024 for Annual Exam   Saw health advisor earlier this week for medicare wellness visit. Note reviewed.    No results found.  Flowsheet Row Clinical Support from 01/05/2024 in [facility]  PHQ-2 Total Score 0       01/05/2024    9:27 AM 01/01/2024    8:46 AM 07/07/2023    9:02 AM 05/28/2023    9:53 AM 01/04/2023   10:37 AM  Fall Risk   Falls in the past year? 0 1 0 0 0  Comment no falls - confirmed with pt      Number falls in past yr: 0 0     Injury with Fall? 0 0     Risk for fall due to : No Fall Risks      Follow up Falls evaluation completed;Falls prevention discussed       Severe aortic stenosis s/p TAVR 10/2020, bicuspid aortic valve followed by cardiology Tena). S/p catheterization 08/2020 without significant CAD.    Bilateral sensorineural hearing loss - told hearing aide candidate if desired. Dizziness not due to vertigo or meniere's.   DM - sees endo Dr Von --> Thapa on ozempic , metformin , and farxiga .   Preventative: Colonoscopy 03/2023 - multiple HPs, 1 TA, diverticulosis, rpt 3 yrs ([facility] @ [facility]) Prostate cancer screening - Brother with prostate cancer age 10. Nocturia x1. Followed by urology Dr Alvaro for elevated PSA (6.5).  Lung cancer screening - not eligible  Flu shot yearly COVID vaccine - Pfizer 06/2019, 07/2019, booster 03/2020 Tdap 04/2012 Pneumovax23 12/2012, 12/2019. Prevnar13 12/2018  Shingrix - discussed, declines. Has had shingles episodes x4.  RSV - discussed.  Advanced planning discussion - has this at home. HCPOA would  be wife then oldest child. Asked to bring us  copy. Seat belt use discussed.  Sunscreen use discussed. No changing moles on skin.  Sleep - averaging 4-5 hours/night, awakens early then can't fall back asleep. Benadryl PRN, melatonin ineffective. 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  Diet: good water, fruits/vegetables daily. Keeps record of caloric intake     Relevant past medical, surgical, family and social history reviewed and updated as indicated. Interim medical history since our last visit reviewed. Allergies and medications reviewed and updated. Outpatient Medications Prior to Visit  Medication Sig Dispense Refill   amLODipine  (NORVASC ) 10 MG tablet Take 1 tablet (10 mg total) by mouth daily. 90 tablet 3   aspirin  EC 81 MG tablet Take 81 mg by mouth every evening. Swallow whole.     cephALEXin  (KEFLEX ) 500 MG capsule TAKE 4 CAPSULES BY MOUTH 60 MINUTES PRIOR TO ANY DENTAL APPOINTMENTS 12 capsule 2   Cyanocobalamin  (VITAMIN B-12)  2500 MCG SUBL Take 2,500 mcg by mouth daily.     dapagliflozin  propanediol (FARXIGA ) 10 MG TABS tablet Take 1 tablet (10 mg total) by mouth daily. 90 tablet 3   glucose blood (ACCU-CHEK GUIDE) test strip Check blood sugar 2 times daily 100 each 12   Lancets (ACCU-CHEK MULTICLIX) lancets Check blood sugar 2 times daily 100 each 12   Magnesium  250 MG TABS Take 250 mg by mouth every evening.     metFORMIN  (GLUCOPHAGE ) 1000 MG tablet Take 1 tablet (1,000 mg total) by mouth 2 (two) times daily with a meal. 180 tablet 3   omeprazole  (PRILOSEC) 20 MG capsule Take 1 capsule (20 mg total) by mouth daily.     sildenafil  (REVATIO ) 20 MG tablet TAKE 5 TABLETS BY MOUTH ONCE DAILY AS NEEDED 30 tablet 2   tadalafil   (CIALIS ) 5 MG tablet Take 1 tablet (5 mg total) by mouth daily. 30 tablet 11   tirzepatide  (MOUNJARO ) 12.5 MG/0.5ML Pen Inject 12.5 mg into the skin once a week. 6 mL 1   TURMERIC PO Take 1,800 mg by mouth every evening.     vitamin C (ASCORBIC ACID) 500 MG tablet Take 500 mg by mouth daily.     Vitamin D -Vitamin K (K2 PLUS D3 PO) Take 1 tablet by mouth every evening.     Zinc 50 MG TABS Take 50 mg by mouth every evening.     losartan  (COZAAR ) 100 MG tablet Take 1 tablet (100 mg total) by mouth daily. 90 tablet 3   fluticasone  (FLONASE ) 50 MCG/ACT nasal spray Place 1 spray into both nostrils daily as needed for allergies or rhinitis. (Patient not taking: Reported on 01/07/2024)     rosuvastatin  (CRESTOR ) 5 MG tablet Take 1 tablet (5 mg total) by mouth daily. (Patient not taking: Reported on 01/07/2024) 90 tablet 4   No facility-administered medications prior to visit.     Per HPI unless specifically indicated in ROS section below Review of Systems  Objective:  BP 130/82   Pulse 78   Temp 98.1 F (36.7 C) (Oral)   Ht 5' 9 (1.753 m)   Wt 239 lb 2 oz (108.5 kg)   SpO2 99%   BMI 35.31 kg/m   Wt Readings from Last 3 Encounters:  01/07/24 239 lb 2 oz (108.5 kg)  01/05/24 237 lb (107.5 kg)  08/12/23 237 lb 12.8 oz (107.9 kg)      Physical Exam Vitals and nursing note reviewed.  Constitutional:      General: He is not in acute distress.    Appearance: Normal appearance. He is well-developed. He is not ill-appearing.  HENT:     Head: Normocephalic and atraumatic.     Right Ear: Hearing, tympanic membrane, ear canal and external ear normal.     Left Ear: Hearing, tympanic membrane, ear canal and external ear normal.     Mouth/Throat:     Mouth: Mucous membranes are moist.     Pharynx: Oropharynx is clear. No oropharyngeal exudate or posterior oropharyngeal erythema.  Eyes:     General: No scleral icterus.    Extraocular Movements: Extraocular movements intact.      Conjunctiva/sclera: Conjunctivae normal.     Pupils: Pupils are equal, round, and reactive to light.  Neck:     Thyroid : No thyroid  mass or thyromegaly.     Vascular: Carotid bruit (referred from heart) present.  Cardiovascular:     Rate and Rhythm: Normal rate and regular rhythm.  Pulses: Normal pulses.          Radial pulses are 2+ on the right side and 2+ on the left side.     Heart sounds: Murmur (3/6 systolic USB) heard.  Pulmonary:     Effort: Pulmonary effort is normal. No respiratory distress.     Breath sounds: Normal breath sounds. No wheezing, rhonchi or rales.  Abdominal:     General: Bowel sounds are normal. There is no distension.     Palpations: Abdomen is soft. There is no mass.     Tenderness: There is no abdominal tenderness. There is no guarding or rebound.     Hernia: No hernia is present.  Musculoskeletal:        General: Normal range of motion.     Cervical back: Normal range of motion and neck supple.     Right lower leg: No edema.     Left lower leg: No edema.  Lymphadenopathy:     Cervical: No cervical adenopathy.  Skin:    General: Skin is warm and dry.     Findings: No rash.  Neurological:     General: No focal deficit present.     Mental Status: He is alert and oriented to person, place, and time.  Psychiatric:        Mood and Affect: Mood normal.        Behavior: Behavior normal.        Thought Content: Thought content normal.        Judgment: Judgment normal.       Results for orders placed or performed in visit on 12/31/23  CBC with Differential/Platelet   Collection Time: 12/31/23  8:57 AM  Result Value Ref Range   WBC 6.2 4.0 - 10.5 K/uL   RBC 4.55 4.22 - 5.81 Mil/uL   Hemoglobin 14.6 13.0 - 17.0 g/dL   HCT 55.8 60.9 - 47.9 %   MCV 96.9 78.0 - 100.0 fl   MCHC 33.1 30.0 - 36.0 g/dL   RDW 85.5 88.4 - 84.4 %   Platelets 255.0 150.0 - 400.0 K/uL   Neutrophils Relative % 51.3 43.0 - 77.0 %   Lymphocytes Relative 32.6 12.0 - 46.0 %    Monocytes Relative 13.6 (H) 3.0 - 12.0 %   Eosinophils Relative 1.9 0.0 - 5.0 %   Basophils Relative 0.6 0.0 - 3.0 %   Neutro Abs 3.2 1.4 - 7.7 K/uL   Lymphs Abs 2.0 0.7 - 4.0 K/uL   Monocytes Absolute 0.8 0.1 - 1.0 K/uL   Eosinophils Absolute 0.1 0.0 - 0.7 K/uL   Basophils Absolute 0.0 0.0 - 0.1 K/uL  Microalbumin / creatinine urine ratio   Collection Time: 12/31/23  8:57 AM  Result Value Ref Range   Microalb, Ur <0.7 mg/dL   Creatinine,U 54.8 mg/dL   Microalb Creat Ratio Unable to calculate 0.0 - 30.0 mg/g  Vitamin B12   Collection Time: 12/31/23  8:57 AM  Result Value Ref Range   Vitamin B-12 716 211 - 911 pg/mL  Hemoglobin A1c   Collection Time: 12/31/23  8:57 AM  Result Value Ref Range   Hgb A1c MFr Bld 6.5 4.6 - 6.5 %  Comprehensive metabolic panel with GFR   Collection Time: 12/31/23  8:57 AM  Result Value Ref Range   Sodium 138 135 - 145 mEq/L   Potassium 4.8 3.5 - 5.1 mEq/L   Chloride 99 96 - 112 mEq/L   CO2 30 19 - 32 mEq/L  Glucose, Bld 122 (H) 70 - 99 mg/dL   BUN 19 6 - 23 mg/dL   Creatinine, Ser 8.93 0.40 - 1.50 mg/dL   Total Bilirubin 0.7 0.2 - 1.2 mg/dL   Alkaline Phosphatase 43 39 - 117 U/L   AST 16 0 - 37 U/L   ALT 22 0 - 53 U/L   Total Protein 7.0 6.0 - 8.3 g/dL   Albumin 4.6 3.5 - 5.2 g/dL   GFR 28.90 >39.99 mL/min   Calcium  9.8 8.4 - 10.5 mg/dL  Lipid panel   Collection Time: 12/31/23  8:57 AM  Result Value Ref Range   Cholesterol 167 0 - 200 mg/dL   Triglycerides 885.9 0.0 - 149.0 mg/dL   HDL 23.59 >60.99 mg/dL   VLDL 77.1 0.0 - 59.9 mg/dL   LDL Cholesterol 68 0 - 99 mg/dL   Total CHOL/HDL Ratio 2    NonHDL 90.64     Assessment & Plan:   Problem List Items Addressed This Visit     Advanced directives, counseling/discussion - Primary (Chronic)   Previously discussed - asked to bring us  copy      Type 2 diabetes mellitus with other specified complication (HCC)   Chronic, stable followed by endo.       Relevant Medications   losartan   (COZAAR ) 100 MG tablet   rosuvastatin  (CRESTOR ) 5 MG tablet   Hyperlipidemia associated with type 2 diabetes mellitus (HCC)   Chronic, stable on rosuvastatin  5mg  daily - continue. The 10-year ASCVD risk score (Arnett DK, et al., 2019) is: 29.2%   Values used to calculate the score:     Age: 28 years     Clincally relevant sex: Male     Is Non-Hispanic African American: No     Diabetic: Yes     Tobacco smoker: No     Systolic Blood Pressure: 130 mmHg     Is BP treated: Yes     HDL Cholesterol: 76.4 mg/dL     Total Cholesterol: 167 mg/dL       Relevant Medications   losartan  (COZAAR ) 100 MG tablet   rosuvastatin  (CRESTOR ) 5 MG tablet   Essential hypertension   Chronic, stable. Continue current regimen.       Relevant Medications   losartan  (COZAAR ) 100 MG tablet   rosuvastatin  (CRESTOR ) 5 MG tablet   GERD (gastroesophageal reflux disease)   Transitioning from omeprazole  20mg  PRN to pepcid PRN      Relevant Medications   famotidine (PEPCID) 20 MG tablet   Severe obesity (BMI 35.0-39.9) with comorbidity (HCC)   Continue to encourage healthy diet and lifestyle choices to affect sustainable weight loss.  Reviewed active lifestyle - encouraged continue this. Obesity complicated by comorbidities of hypertension, HLD, DM      Elevated PSA   PSA levels have normalized. Had reassuring biopsy 2022.  Continues yearly uro f/u given fmhx      Metabolic dysfunction-associated fatty liver disease (MAFLD)   LFTs remain table. Fibrosis 4 Score = .94 Fib-4 interpretation is not validated for people under 35 or over 46 years of age. However, scores under 2.0 are generally considered low risk.       S/P TAVR (transcatheter aortic valve replacement)   Liver lesion, right lobe   Thought focal nodular hyperplasia by MRI 2022. LFTs remain normal.         Meds ordered this encounter  Medications   losartan  (COZAAR ) 100 MG tablet    Sig: Take 1 tablet (100 mg total)  by mouth daily.     Dispense:  90 tablet    Refill:  3   rosuvastatin  (CRESTOR ) 5 MG tablet    Sig: Take 1 tablet (5 mg total) by mouth daily.    Dispense:  90 tablet    Refill:  3   famotidine (PEPCID) 20 MG tablet    Sig: Take 1 tablet (20 mg total) by mouth daily.    No orders of the defined types were placed in this encounter.   Patient Instructions  Check with Joellen if you'd like to receive updated tetanus shot.  Continue current medicines You are doing well today Return as needed or in 1 year for next physical   Follow up plan: Return in about 1 year (around 01/06/2025) for medicare wellness visit, follow up visit.  Anton Blas, MD

## 2024-01-07 NOTE — Assessment & Plan Note (Addendum)
 Previously discussed - asked to bring us  copy

## 2024-01-07 NOTE — Assessment & Plan Note (Addendum)
 Thought focal nodular hyperplasia by MRI 2022. LFTs remain normal.

## 2024-01-11 ENCOUNTER — Other Ambulatory Visit: Payer: Self-pay

## 2024-01-11 MED ORDER — BOOSTRIX 5-2.5-18.5 LF-MCG/0.5 IM SUSY
PREFILLED_SYRINGE | INTRAMUSCULAR | 0 refills | Status: AC
  Filled 2024-01-11: qty 0.5, 1d supply, fill #0

## 2024-01-28 ENCOUNTER — Other Ambulatory Visit: Payer: Self-pay | Admitting: Internal Medicine

## 2024-01-28 DIAGNOSIS — I10 Essential (primary) hypertension: Secondary | ICD-10-CM

## 2024-02-05 ENCOUNTER — Other Ambulatory Visit: Payer: Self-pay | Admitting: Family Medicine

## 2024-02-05 ENCOUNTER — Encounter: Payer: Self-pay | Admitting: Family Medicine

## 2024-02-05 DIAGNOSIS — N529 Male erectile dysfunction, unspecified: Secondary | ICD-10-CM

## 2024-02-07 NOTE — Telephone Encounter (Signed)
 Name of Medication: Tadalafil  5mg  #30 Name of Pharmacy: [id number] - [place], [place] - [address]  Last Fill or Written Date and Quantity: 01/25/23 Last Office Visit and Type: 01/07/24

## 2024-02-08 ENCOUNTER — Other Ambulatory Visit (HOSPITAL_COMMUNITY): Payer: Self-pay

## 2024-02-08 MED ORDER — TADALAFIL 5 MG PO TABS
5.0000 mg | ORAL_TABLET | Freq: Every day | ORAL | 11 refills | Status: AC
Start: 2024-02-08 — End: ?

## 2024-02-08 NOTE — Telephone Encounter (Signed)
This was refilled for patient.

## 2024-02-19 ENCOUNTER — Encounter: Payer: Self-pay | Admitting: Internal Medicine

## 2024-02-19 DIAGNOSIS — I10 Essential (primary) hypertension: Secondary | ICD-10-CM

## 2024-02-21 MED ORDER — AMLODIPINE BESYLATE 10 MG PO TABS: 10.0000 mg | ORAL_TABLET | Freq: Every day | ORAL | 3 refills | Status: AC

## 2024-02-29 ENCOUNTER — Other Ambulatory Visit: Payer: Self-pay

## 2024-02-29 ENCOUNTER — Telehealth: Payer: Self-pay

## 2024-02-29 NOTE — Telephone Encounter (Signed)
 MD request patient be called and lab appt be canceled. Does not need at this time per MD.

## 2024-03-08 ENCOUNTER — Other Ambulatory Visit

## 2024-03-15 ENCOUNTER — Encounter: Payer: Self-pay | Admitting: Endocrinology

## 2024-03-15 ENCOUNTER — Ambulatory Visit: Admitting: Endocrinology

## 2024-03-15 VITALS — BP 132/72 | HR 81 | Resp 20 | Ht 69.0 in | Wt 246.6 lb

## 2024-03-15 DIAGNOSIS — E119 Type 2 diabetes mellitus without complications: Secondary | ICD-10-CM | POA: Diagnosis not present

## 2024-03-15 DIAGNOSIS — Z7985 Long-term (current) use of injectable non-insulin antidiabetic drugs: Secondary | ICD-10-CM

## 2024-03-15 DIAGNOSIS — Z7984 Long term (current) use of oral hypoglycemic drugs: Secondary | ICD-10-CM | POA: Diagnosis not present

## 2024-03-15 MED ORDER — TIRZEPATIDE 15 MG/0.5ML ~~LOC~~ SOAJ
15.0000 mg | SUBCUTANEOUS | 4 refills | Status: AC
Start: 2024-03-15 — End: ?

## 2024-03-15 NOTE — Progress Notes (Signed)
 Outpatient Endocrinology Note [place] Chonda Baney, MD  03/15/24  Patient's Name: Evan [name]    DOB: [birth year]    MRN: [id number]                                                    REASON OF VISIT: Follow-up for type 2 diabetes mellitus  PCP: Rilla Baller, MD  HISTORY OF PRESENT ILLNESS:   Evan [name] is a 68 y.o. old male with past medical history listed below, is here for follow up of type 2 diabetes mellitus.   Pertinent Diabetes History: Patient was diagnosed with type 2 diabetes mellitus in 2012.  He has not been on insulin  therapy.  He has controlled type 2 diabetes mellitus.  Chronic Diabetes Complications : Retinopathy: no. Last ophthalmology exam was done on annually reportedly. Nephropathy: no, on losartan . Peripheral neuropathy: no Coronary artery disease: no Stroke: no  Relevant comorbidities and cardiovascular risk factors: Obesity: yes Body mass index is 36.42 kg/m.  Hypertension: yes Hyperlipidemia. Yes, on statin.  Current / Home Diabetic regimen includes: Mounjaro  12.5 mg weekly. Metformin  1000 mg 2 times a day. Farxiga  10 mg daily.  Prior diabetic medications: Ozempic  changed to Mounjaro .  Glycemic data:   Forgot to bring glucometer.  Patient reports fasting blood sugar 116-140 range.  Blood sugar 2 hours after eating usually 140 range.  Hypoglycemia: Patient has no hypoglycemic episodes. Patient has hypoglycemia awareness.  Factors modifying glucose control: 1.  Diabetic diet assessment: 2-3 meals a day.  2.  Staying active or exercising:   3.  Medication compliance: compliant all of the time.  Interval history  Hemoglobin A1c in July was 6.5%.  Diabetes regimen as reviewed and noted above.  He gained 5 to 6 pound of weight in last 3 months.  He has been tolerating Mounjaro  well.  No other complaints today.  REVIEW OF SYSTEMS As per history of present illness.   PAST MEDICAL HISTORY: Past Medical History:  Diagnosis Date   Adrenal  adenoma    Allergy    Arthritis    Complication of anesthesia    difficulty to weak up   COVID-19 virus infection 05/2020   Depression    intolerant of cymbalta and wellbutrin   Diverticulosis of colon    Esophagitis 03/2015   by EGD   Gastritis 03/2015   by EGD - mild, chronic   GERD (gastroesophageal reflux disease)    Heart murmur    Hyperlipidemia    Hypertension    Left ventricular hypertrophy    Lichen simplex chronicus 05/2015   R scrotum ([place])   Liver lesion    2022 CTs: There was an incidental finding of a hypervascular lesion in the right lobe of the liver as well as left adrenal nodule. He underwent MRI of the abdomen showing the appearance of the liver favored focal nodular hyperplasia, as well as small left adrenal adenoma.   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 Verlin & Dr. Lucas    Severe aortic stenosis    T2DM (type 2 diabetes mellitus) (HCC)    DSME 09/2013  PAST SURGICAL HISTORY: Past Surgical History:  Procedure Laterality Date   CARDIAC VALVE REPLACEMENT  Oct 29, 2020   COLONOSCOPY  06/15/2004   diverticulosis per pt   COLONOSCOPY  03/16/2015   2 polyps, rpt 5 yrs Kristen)   COLONOSCOPY  03/2023   TA, multiple HPs, fair colon prep, sigmoid diverticulosis, rpt ?([place]t)   ESOPHAGOGASTRODUODENOSCOPY  03/16/2015   mild chronic gastritis, esophagitis    INTRAOPERATIVE TRANSTHORACIC ECHOCARDIOGRAM Left 10/29/2020   Procedure: INTRAOPERATIVE TRANSTHORACIC ECHOCARDIOGRAM;  Surgeon: Verlin Lonni BIRCH, MD;  Location: [facility];  Service: Open Heart Surgery;  Laterality: Left;   LUMBAR LAMINECTOMY  06/16/1987   L5-S1   RIGHT/LEFT HEART CATH AND CORONARY ANGIOGRAPHY N/A 08/15/2020   Procedure: RIGHT/LEFT HEART CATH AND CORONARY ANGIOGRAPHY;   Surgeon: Verlin Lonni BIRCH, MD;  Location: [facility] INVASIVE CV LAB;  Service: Cardiovascular;  Laterality: N/A;   SPINE SURGERY  02/1988   Discotomy, l5s2   TRANSCATHETER AORTIC VALVE REPLACEMENT, TRANSFEMORAL Bilateral 10/29/2020   Procedure: TRANSCATHETER AORTIC VALVE REPLACEMENT, TRANSFEMORAL;  Surgeon: Verlin Lonni BIRCH, MD;  Location: [facility] OR;  Service: Open Heart Surgery;  Laterality: Bilateral;   ULTRASOUND GUIDANCE FOR VASCULAR ACCESS Bilateral 10/29/2020   Procedure: ULTRASOUND GUIDANCE FOR VASCULAR ACCESS;  Surgeon: Verlin Lonni BIRCH, MD;  Location: [facility] OR;  Service: Open Heart Surgery;  Laterality: Bilateral;    ALLERGIES: Allergies  Allergen Reactions   Penicillins     Respiratory distress as infant    FAMILY HISTORY:  Family History  Problem Relation Age of Onset   Alzheimer's disease Father    Hypertension Father    Stroke Father    Hyperlipidemia Father    Vision loss Father    Prostate cancer Brother    Diabetes Brother    Alcohol abuse Brother    CAD Brother    Arthritis Brother    Hearing loss Brother    Heart disease Brother    Obesity Brother    Coronary artery disease Maternal Uncle    Schizophrenia Paternal Uncle    Emphysema Mother    Alcohol abuse Mother    Arthritis Mother    Hearing loss Mother    Varicose Veins Mother    Stroke Maternal Grandmother    Stroke Maternal Grandfather    Diabetes Maternal Grandfather    Stroke Paternal Grandmother    Stroke Paternal Grandfather    Arthritis Brother    Hearing loss Brother     SOCIAL HISTORY: Social History   Socioeconomic History   Marital status: Married    Spouse name: Not on file   Number of children: 2   Years of education: Not on file   Highest education level: Master's degree (e.g., MA, MS, MEng, MEd, MSW, MBA)  Occupational History   Occupation: Retired-Worked for the [employer]  Tobacco Use   Smoking status: Never   Smokeless tobacco: Never   Tobacco  comments:    As soon as I start  Substance and Sexual Activity   Alcohol use: Yes    Alcohol/week: 2.0 standard drinks of alcohol    Types: 2 Shots of liquor per week    Comment: occasional   Drug use: No   Sexual activity: Yes    Birth control/protection: None  Other Topics Concern   Not on file  Social History Narrative   Caffeine: 3 cups coffee/day   Lives with wife and daughter, son in college, 2 cats and 1 dog   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  Keeps record of caloric intake   Social Drivers of Health   Financial Resource Strain: Low Risk  (01/05/2024)   Overall Financial Resource Strain (CARDIA)    Difficulty of Paying Living Expenses: Not hard at all  Food Insecurity: No Food Insecurity (01/05/2024)   Hunger Vital Sign    Worried About Running Out of Food in the Last Year: Never true    Ran Out of Food in the Last Year: Never true  Transportation Needs: No Transportation Needs (01/05/2024)   PRAPARE - Administrator, Civil Service (Medical): No    Lack of Transportation (Non-Medical): No  Physical Activity: Sufficiently Active (01/05/2024)   Exercise Vital Sign    Days of Exercise per Week: 6 days    Minutes of Exercise per Session: 100 min  Stress: No Stress Concern Present (01/05/2024)   [employer] of Occupational Health - Occupational Stress Questionnaire    Feeling of Stress: Only a little  Social Connections: Socially Integrated (01/05/2024)   Social Connection and Isolation Panel    Frequency of Communication with Friends and Family: Once a week    Frequency of Social Gatherings with Friends and Family: Twice a week    Attends Religious Services: More than 4 times per year    Active Member of [employer] or Organizations: Yes    Attends Engineer, structural: More than 4 times per year    Marital Status: Married    MEDICATIONS:   Current Outpatient Medications  Medication Sig Dispense Refill   amLODipine  (NORVASC ) 10 MG tablet Take 1 tablet (10 mg total) by mouth daily. 90 tablet 3   aspirin  EC 81 MG tablet Take 81 mg by mouth every evening. Swallow whole.     cephALEXin  (KEFLEX ) 500 MG capsule TAKE 4 CAPSULES BY MOUTH 60 MINUTES PRIOR TO ANY DENTAL APPOINTMENTS 12 capsule 2   Cyanocobalamin  (VITAMIN B-12) 2500 MCG SUBL Take 2,500 mcg by mouth daily.     dapagliflozin  propanediol (FARXIGA ) 10 MG TABS tablet Take 1 tablet (10 mg total) by mouth daily. 90 tablet 3   famotidine  (PEPCID ) 20 MG tablet Take 1 tablet (20 mg total) by mouth daily.     fluticasone  (FLONASE ) 50 MCG/ACT nasal spray Place 1 spray into both nostrils daily as needed for allergies or rhinitis.     glucose blood (ACCU-CHEK GUIDE) test strip Check blood sugar 2 times daily 100 each 12   Lancets (ACCU-CHEK MULTICLIX) lancets Check blood sugar 2 times daily 100 each 12   losartan  (COZAAR ) 100 MG tablet Take 1 tablet (100 mg total) by mouth daily. 90 tablet 3   Magnesium  250 MG TABS Take 250 mg by mouth every evening.     metFORMIN  (GLUCOPHAGE ) 1000 MG tablet Take 1 tablet (1,000 mg total) by mouth 2 (two) times daily with a meal. 180 tablet 3   omeprazole  (PRILOSEC) 20 MG capsule Take 1 capsule (20 mg total) by mouth daily.     rosuvastatin  (CRESTOR ) 5 MG tablet Take 1 tablet (5 mg total) by mouth daily. 90 tablet 3   sildenafil  (REVATIO ) 20 MG tablet TAKE 5 TABLETS BY MOUTH ONCE DAILY AS NEEDED 30 tablet 2   tadalafil  (CIALIS ) 5 MG tablet Take 1 tablet (5 mg total) by mouth daily. 30 tablet 11   Tdap (BOOSTRIX ) 5-2.5-18.5 LF-MCG/0.5 injection Inject into the muscle. 0.5 mL 0   tirzepatide  (MOUNJARO ) 15 MG/0.5ML Pen  Inject 15 mg into the skin once a week. 6 mL 4   TURMERIC PO Take 1,800 mg by mouth every evening.     vitamin C (ASCORBIC ACID) 500 MG tablet Take 500 mg by mouth daily.     Vitamin D -Vitamin K (K2 PLUS D3 PO) Take 1 tablet by mouth every  evening.     Zinc 50 MG TABS Take 50 mg by mouth every evening.     No current facility-administered medications for this visit.    PHYSICAL EXAM: Vitals:   03/15/24 1048  BP: 132/72  Pulse: 81  Resp: 20  SpO2: 97%  Weight: 246 lb 9.6 oz (111.9 kg)  Height: 5' 9 (1.753 m)    Body mass index is 36.42 kg/m.  Wt Readings from Last 3 Encounters:  03/15/24 246 lb 9.6 oz (111.9 kg)  01/07/24 239 lb 2 oz (108.5 kg)  01/05/24 237 lb (107.5 kg)    General: Well developed, well nourished male in no apparent distress.  HEENT: AT/[place], no external lesions.  Eyes: Conjunctiva clear and no icterus. Neck: Neck supple  Lungs: Respirations not labored Neurologic: Alert, oriented, normal speech Extremities / Skin: Dry.   Psychiatric: Does not appear depressed or anxious  Diabetic Foot Exam - Simple   No data filed    LABS Reviewed Lab Results  Component Value Date   HGBA1C 6.5 12/31/2023   HGBA1C 6.6 (A) 07/07/2023   HGBA1C 6.3 04/02/2023   Lab Results  Component Value Date   FRUCTOSAMINE 272 12/25/2020   FRUCTOSAMINE 224 12/19/2019   FRUCTOSAMINE 239 12/13/2017   Lab Results  Component Value Date   CHOL 167 12/31/2023   HDL 76.40 12/31/2023   LDLCALC 68 12/31/2023   LDLDIRECT 89.0 12/23/2021   TRIG 114.0 12/31/2023   CHOLHDL 2 12/31/2023   Lab Results  Component Value Date   MICRALBCREAT Unable to calculate 12/31/2023   MICRALBCREAT NOTE 08/17/2023   Lab Results  Component Value Date   CREATININE 1.06 12/31/2023   Lab Results  Component Value Date   GFR 71.09 12/31/2023    ASSESSMENT / PLAN  1. Type 2 diabetes mellitus without complication, without long-term current use of insulin  (HCC)     Diabetes Mellitus type 2, complicated by no known complications. - Diabetic status / severity: fair Controlled.  Lab Results  Component Value Date   HGBA1C 6.5 12/31/2023    - Hemoglobin A1c goal : <6.5%  - Medications: No change.  I) increase Mounjaro  from  12.5 to 15 mg weekly.   II) metformin  1000 mg 2 times a day. III) Farxiga  10 mg daily.  - Home glucose testing: Check blood sugar in the morning fasting and occasionally at bedtime. - Discussed/ Gave Hypoglycemia treatment plan.  # Consult : not required at this time.   # Annual urine for microalbuminuria/ creatinine ratio, no microalbuminuria currently, continue ACE/ARB /losartan  /Farxiga . Last  Lab Results  Component Value Date   MICRALBCREAT Unable to calculate 12/31/2023   -Will check urine microalbumin creatinine ratio today.  # Foot check nightly / neuropathy.  # Annual dilated diabetic eye exams.   - Diet: Make healthy diabetic food choices - Life style / activity / exercise: discussed.   2. Blood pressure  -  BP Readings from Last 1 Encounters:  03/15/24 132/72    - Control is  in target.  - No change in current plans.   3. Lipid status / Hyperlipidemia - Last  Lab Results  Component Value Date  LDLCALC 68 12/31/2023   - Continue crestor  5mg  daily.   Diagnoses and all orders for this visit:  Type 2 diabetes mellitus without complication, without long-term current use of insulin  (HCC) -     tirzepatide  (MOUNJARO ) 15 MG/0.5ML Pen; Inject 15 mg into the skin once a week.      DISPOSITION Follow up in clinic in 4 - 6 months suggested.     All questions answered and patient verbalized understanding of the plan.  [place] Jahaan Vanwagner, MD [facility] Endocrinology [facility] [address] [place], [address] [place], [place] [address] Phone # [phone]  At least part of this note was generated using voice recognition software. Inadvertent word errors may have occurred, which were not recognized during the proofreading process.

## 2024-03-16 ENCOUNTER — Encounter: Payer: Self-pay | Admitting: Internal Medicine

## 2024-03-16 ENCOUNTER — Ambulatory Visit: Attending: Internal Medicine | Admitting: Internal Medicine

## 2024-03-16 VITALS — BP 138/74 | HR 84 | Ht 71.0 in | Wt 241.0 lb

## 2024-03-16 DIAGNOSIS — I10 Essential (primary) hypertension: Secondary | ICD-10-CM | POA: Insufficient documentation

## 2024-03-16 DIAGNOSIS — I350 Nonrheumatic aortic (valve) stenosis: Secondary | ICD-10-CM | POA: Insufficient documentation

## 2024-03-16 DIAGNOSIS — E1169 Type 2 diabetes mellitus with other specified complication: Secondary | ICD-10-CM | POA: Diagnosis not present

## 2024-03-16 DIAGNOSIS — E785 Hyperlipidemia, unspecified: Secondary | ICD-10-CM | POA: Insufficient documentation

## 2024-03-16 DIAGNOSIS — Q2381 Bicuspid aortic valve: Secondary | ICD-10-CM | POA: Insufficient documentation

## 2024-03-16 DIAGNOSIS — Z952 Presence of prosthetic heart valve: Secondary | ICD-10-CM | POA: Insufficient documentation

## 2024-03-16 NOTE — Patient Instructions (Addendum)
 Medication Instructions:   Your physician recommends that you continue on your current medications as directed. Please refer to the Current Medication list given to you today.   *If you need a refill on your cardiac medications before your next appointment, please call your pharmacy*    Lab Work: NONE ORDERED  TODAY     If you have labs (blood work) drawn today and your tests are completely normal, you will receive your results only by: MyChart Message (if you have MyChart) OR A paper copy in the mail If you have any lab test that is abnormal or we need to change your treatment, we will call you to review the results.    Testing/Procedures:  BEFORE  YEAR FOLLOW UP  Your physician has requested that you have an echocardiogram. Echocardiography is a painless test that uses sound waves to create images of your heart. It provides your doctor with information about the size and shape of your heart and how well your heart's chambers and valves are working. This procedure takes approximately one hour. There are no restrictions for this procedure. Please do NOT wear cologne, perfume, aftershave, or lotions (deodorant is allowed). Please arrive 15 minutes prior to your appointment time.  Please note: We ask at that you not bring children with you during ultrasound (echo/ vascular) testing. Due to room size and safety concerns, children are not allowed in the ultrasound rooms during exams. Our front office staff cannot provide observation of children in our lobby area while testing is being conducted. An adult accompanying a patient to their appointment will only be allowed in the ultrasound room at the discretion of the ultrasound technician under special circumstances. We apologize for any inconvenience.     Follow-Up: At [facility], you and your health needs are our priority.  As part of our continuing mission to provide you with exceptional heart care, our providers are all part  of one team.  This team includes your primary Cardiologist (physician) and Advanced Practice Providers or APPs (Physician Assistants and Nurse Practitioners) who all work together to provide you with the care you need, when you need it.   Your next appointment:    1 year   Provider:   Soyla DELENA Merck, MD     {We recommend signing up for the patient portal called MyChart.  Sign up information is provided on this After Visit Summary.  MyChart is used to connect with patients for Virtual Visits (Telemedicine).  Patients are able to view lab/test results, encounter notes, upcoming appointments, etc.  Non-urgent messages can be sent to your provider as well.   To learn more about what you can do with MyChart, go to [email or web address].   Other Instructions

## 2024-03-16 NOTE — Progress Notes (Signed)
  Cardiology Office Note:  .   Date:  03/16/2024  ID:  Evan [name], DOB [birth year], MRN [id number] PCP: Rilla Baller, MD   [facility]Care Providers Cardiologist:  Soyla DELENA Merck, MD    History of Present Illness: .   Evan [name] is a 68 y.o. male.  Discussed the use of AI scribe software for clinical note transcription with the patient, who gave verbal consent to proceed.  History of Present Illness Evan [name] is a 68 year old male with a history of TAVR for aortic valve stenosis who presents for a routine cardiovascular follow-up.  He underwent TAVR on Oct 29, 2020, for bicuspid aortic valve stenosis and has experienced no significant health changes since. He denies palpitations or sensations of heart racing.  He has left ventricular hypertrophy and non-alcoholic fatty liver disease. Blood pressure is 138/74 mmHg, lower than previous readings, managed with amlodipine  10 mg daily and losartan  100 mg daily.  Diabetes is well-controlled with Farxiga  10 mg daily and Mounjaro , with an A1c of 6.5%. He takes rosuvastatin  5 mg daily, with an LDL of 68 mg/dL, and a daily baby aspirin .    ROS: negative except per HPI above.  Studies Reviewed: SABRA   EKG Interpretation Date/Time:  Thursday March 16 2024 11:53:13 EDT Ventricular Rate:  78 PR Interval:  158 QRS Duration:  84 QT Interval:  356 QTC Calculation: 405 R Axis:   14  Text Interpretation: Normal sinus rhythm with sinus arrhythmia Normal ECG When compared with ECG of 09-Feb-2023 09:42, Criteria for Anterior infarct are no longer Present Confirmed by Merck Soyla ([id number]) on 03/16/2024 12:18:09 PM    Results LABS LDL: 68 A1c: 6.5  DIAGNOSTIC EKG: Normal Echocardiogram: Normal left ventricular function, normal right heart function, aortic valve function normal, normal intracardiac pressures (2024) Risk Assessment/Calculations:       Physical Exam:   VS:  BP 138/74   Pulse 84   Ht 5' 11 (1.803 m)    Wt 241 lb (109.3 kg)   SpO2 97%   BMI 33.61 kg/m    Wt Readings from Last 3 Encounters:  03/16/24 241 lb (109.3 kg)  03/15/24 246 lb 9.6 oz (111.9 kg)  01/07/24 239 lb 2 oz (108.5 kg)     Physical Exam GENERAL: Alert, cooperative, well developed, no acute distress HEENT: Normocephalic, normal oropharynx, moist mucous membranes CHEST: Clear to auscultation bilaterally, no wheezes, rhonchi, or crackles CARDIOVASCULAR: Normal heart rate and rhythm, S1 and S2 normal without murmurs ABDOMEN: Soft, non-tender, non-distended, without organomegaly, normal bowel sounds EXTREMITIES: No cyanosis or edema NEUROLOGICAL: Cranial nerves grossly intact, moves all extremities without gross motor or sensory deficit   ASSESSMENT AND PLAN: .    Assessment and Plan Assessment & Plan Status post TAVR for bicuspid aortic valve stenosis Status post TAVR on Oct 29, 2020, with improved EKG and normal echocardiogram findings. No symptoms warranting immediate echocardiogram. Family screening for bicuspid valve discussed. - Order echocardiogram in one year before the next follow-up visit.  Hypertension Hypertension well controlled with amlodipine  and losartan . Recent BP 131/71 mmHg.  Type 2 diabetes mellitus, well controlled Type 2 diabetes well controlled with A1c of 6.5. Current medications include Farxiga  and Mounjaro .  Hyperlipidemia, well controlled Hyperlipidemia well controlled with LDL of 68. Continue rosuvastatin  5 mg daily.        Soyla Merck, MD, FACC

## 2024-03-23 DIAGNOSIS — Z23 Encounter for immunization: Secondary | ICD-10-CM | POA: Diagnosis not present

## 2024-06-05 LAB — OPHTHALMOLOGY REPORT-SCANNED

## 2024-07-06 ENCOUNTER — Encounter: Payer: Self-pay | Admitting: Endocrinology

## 2025-01-01 ENCOUNTER — Other Ambulatory Visit

## 2025-01-05 ENCOUNTER — Ambulatory Visit

## 2025-01-08 ENCOUNTER — Encounter: Admitting: Family Medicine

## 2025-03-16 ENCOUNTER — Other Ambulatory Visit (HOSPITAL_COMMUNITY)
